# Patient Record
Sex: Female | Born: 1940
Health system: Southern US, Community
[De-identification: ages and names within clinical notes are randomized; demographics above are authoritative.]

## PROBLEM LIST (undated history)

## (undated) DIAGNOSIS — I5022 Chronic systolic (congestive) heart failure: Secondary | ICD-10-CM

## (undated) DIAGNOSIS — I4892 Unspecified atrial flutter: Secondary | ICD-10-CM

## (undated) DIAGNOSIS — I472 Ventricular tachycardia, unspecified: Secondary | ICD-10-CM

## (undated) DIAGNOSIS — I340 Nonrheumatic mitral (valve) insufficiency: Secondary | ICD-10-CM

## (undated) DIAGNOSIS — I10 Essential (primary) hypertension: Secondary | ICD-10-CM

## (undated) DIAGNOSIS — I779 Disorder of arteries and arterioles, unspecified: Secondary | ICD-10-CM

## (undated) DIAGNOSIS — M1990 Unspecified osteoarthritis, unspecified site: Secondary | ICD-10-CM

## (undated) DIAGNOSIS — I428 Other cardiomyopathies: Secondary | ICD-10-CM

## (undated) DIAGNOSIS — Z95810 Presence of automatic (implantable) cardiac defibrillator: Secondary | ICD-10-CM

## (undated) DIAGNOSIS — I639 Cerebral infarction, unspecified: Secondary | ICD-10-CM

## (undated) DIAGNOSIS — E785 Hyperlipidemia, unspecified: Secondary | ICD-10-CM

## (undated) DIAGNOSIS — E119 Type 2 diabetes mellitus without complications: Secondary | ICD-10-CM

## (undated) DIAGNOSIS — I313 Pericardial effusion (noninflammatory): Secondary | ICD-10-CM

## (undated) DIAGNOSIS — K219 Gastro-esophageal reflux disease without esophagitis: Secondary | ICD-10-CM

## (undated) DIAGNOSIS — I739 Peripheral vascular disease, unspecified: Secondary | ICD-10-CM

## (undated) DIAGNOSIS — I447 Left bundle-branch block, unspecified: Secondary | ICD-10-CM

## (undated) DIAGNOSIS — I3139 Other pericardial effusion (noninflammatory): Secondary | ICD-10-CM

## (undated) HISTORY — PX: HERNIA REPAIR: SHX51

## (undated) HISTORY — DX: Other pericardial effusion (noninflammatory): I31.39

## (undated) HISTORY — DX: Chronic systolic (congestive) heart failure: I50.22

## (undated) HISTORY — DX: Unspecified atrial flutter: I48.92

## (undated) HISTORY — DX: Type 2 diabetes mellitus without complications: E11.9

## (undated) HISTORY — PX: TONSILLECTOMY: SUR1361

## (undated) HISTORY — DX: Gastro-esophageal reflux disease without esophagitis: K21.9

## (undated) HISTORY — DX: Other cardiomyopathies: I42.8

## (undated) HISTORY — DX: Cerebral infarction, unspecified: I63.9

## (undated) HISTORY — DX: Peripheral vascular disease, unspecified: I73.9

## (undated) HISTORY — DX: Disorder of arteries and arterioles, unspecified: I77.9

## (undated) HISTORY — DX: Nonrheumatic mitral (valve) insufficiency: I34.0

## (undated) HISTORY — DX: Pericardial effusion (noninflammatory): I31.3

## (undated) HISTORY — DX: Left bundle-branch block, unspecified: I44.7

## (undated) HISTORY — DX: Hyperlipidemia, unspecified: E78.5

---

## 2000-09-29 ENCOUNTER — Encounter: Payer: Self-pay | Admitting: Family Medicine

## 2000-09-29 ENCOUNTER — Ambulatory Visit (HOSPITAL_COMMUNITY): Admission: RE | Admit: 2000-09-29 | Discharge: 2000-09-29 | Payer: Self-pay | Admitting: Family Medicine

## 2000-11-10 ENCOUNTER — Ambulatory Visit (HOSPITAL_COMMUNITY): Admission: RE | Admit: 2000-11-10 | Discharge: 2000-11-10 | Payer: Self-pay | Admitting: Cardiology

## 2001-02-13 ENCOUNTER — Encounter: Payer: Self-pay | Admitting: Cardiology

## 2001-02-13 ENCOUNTER — Ambulatory Visit (HOSPITAL_COMMUNITY): Admission: RE | Admit: 2001-02-13 | Discharge: 2001-02-13 | Payer: Self-pay | Admitting: Cardiology

## 2001-07-25 ENCOUNTER — Encounter: Payer: Self-pay | Admitting: Family Medicine

## 2001-07-25 ENCOUNTER — Ambulatory Visit (HOSPITAL_COMMUNITY): Admission: RE | Admit: 2001-07-25 | Discharge: 2001-07-25 | Payer: Self-pay | Admitting: Family Medicine

## 2001-10-15 ENCOUNTER — Ambulatory Visit (HOSPITAL_COMMUNITY): Admission: RE | Admit: 2001-10-15 | Discharge: 2001-10-15 | Payer: Self-pay | Admitting: Family Medicine

## 2001-10-15 ENCOUNTER — Encounter: Payer: Self-pay | Admitting: Family Medicine

## 2002-01-08 ENCOUNTER — Encounter: Admission: RE | Admit: 2002-01-08 | Discharge: 2002-03-19 | Payer: Self-pay | Admitting: Cardiology

## 2002-11-06 ENCOUNTER — Ambulatory Visit (HOSPITAL_COMMUNITY): Admission: RE | Admit: 2002-11-06 | Discharge: 2002-11-06 | Payer: Self-pay | Admitting: Family Medicine

## 2002-11-06 ENCOUNTER — Encounter: Payer: Self-pay | Admitting: Family Medicine

## 2003-03-05 ENCOUNTER — Ambulatory Visit (HOSPITAL_COMMUNITY): Admission: RE | Admit: 2003-03-05 | Discharge: 2003-03-05 | Payer: Self-pay | Admitting: Cardiology

## 2003-11-27 ENCOUNTER — Ambulatory Visit (HOSPITAL_COMMUNITY): Admission: RE | Admit: 2003-11-27 | Discharge: 2003-11-27 | Payer: Self-pay | Admitting: Pulmonary Disease

## 2004-04-01 ENCOUNTER — Ambulatory Visit (HOSPITAL_COMMUNITY): Admission: RE | Admit: 2004-04-01 | Discharge: 2004-04-01 | Payer: Self-pay | Admitting: Cardiology

## 2004-05-04 ENCOUNTER — Ambulatory Visit (HOSPITAL_COMMUNITY): Admission: RE | Admit: 2004-05-04 | Discharge: 2004-05-04 | Payer: Self-pay

## 2004-05-06 ENCOUNTER — Ambulatory Visit: Payer: Self-pay | Admitting: Internal Medicine

## 2004-06-06 HISTORY — PX: ABDOMINAL HYSTERECTOMY: SHX81

## 2004-10-12 ENCOUNTER — Ambulatory Visit: Payer: Self-pay | Admitting: Cardiology

## 2004-11-02 ENCOUNTER — Ambulatory Visit (HOSPITAL_COMMUNITY): Admission: RE | Admit: 2004-11-02 | Discharge: 2004-11-02 | Payer: Self-pay | Admitting: Pulmonary Disease

## 2004-11-05 ENCOUNTER — Ambulatory Visit (HOSPITAL_COMMUNITY): Admission: RE | Admit: 2004-11-05 | Discharge: 2004-11-05 | Payer: Self-pay | Admitting: Pulmonary Disease

## 2004-11-29 ENCOUNTER — Ambulatory Visit (HOSPITAL_COMMUNITY): Admission: RE | Admit: 2004-11-29 | Discharge: 2004-11-29 | Payer: Self-pay | Admitting: Pulmonary Disease

## 2005-02-25 ENCOUNTER — Ambulatory Visit: Payer: Self-pay | Admitting: Internal Medicine

## 2005-02-25 ENCOUNTER — Inpatient Hospital Stay (HOSPITAL_COMMUNITY): Admission: EM | Admit: 2005-02-25 | Discharge: 2005-02-26 | Payer: Self-pay | Admitting: Emergency Medicine

## 2005-05-12 ENCOUNTER — Ambulatory Visit: Payer: Self-pay | Admitting: Cardiology

## 2005-05-24 ENCOUNTER — Ambulatory Visit: Payer: Self-pay | Admitting: Internal Medicine

## 2005-05-26 ENCOUNTER — Ambulatory Visit: Payer: Self-pay | Admitting: Internal Medicine

## 2005-05-26 ENCOUNTER — Ambulatory Visit (HOSPITAL_COMMUNITY): Admission: RE | Admit: 2005-05-26 | Discharge: 2005-05-26 | Payer: Self-pay | Admitting: Internal Medicine

## 2005-05-26 ENCOUNTER — Encounter: Payer: Self-pay | Admitting: Internal Medicine

## 2005-05-27 ENCOUNTER — Ambulatory Visit (HOSPITAL_COMMUNITY): Admission: RE | Admit: 2005-05-27 | Discharge: 2005-05-27 | Payer: Self-pay | Admitting: Internal Medicine

## 2005-09-07 ENCOUNTER — Ambulatory Visit (HOSPITAL_COMMUNITY): Admission: RE | Admit: 2005-09-07 | Discharge: 2005-09-07 | Payer: Self-pay | Admitting: Pulmonary Disease

## 2005-10-25 ENCOUNTER — Ambulatory Visit: Payer: Self-pay | Admitting: Cardiology

## 2005-12-01 ENCOUNTER — Ambulatory Visit (HOSPITAL_COMMUNITY): Admission: RE | Admit: 2005-12-01 | Discharge: 2005-12-01 | Payer: Self-pay | Admitting: Pulmonary Disease

## 2006-01-15 ENCOUNTER — Emergency Department (HOSPITAL_COMMUNITY): Admission: EM | Admit: 2006-01-15 | Discharge: 2006-01-15 | Payer: Self-pay | Admitting: Emergency Medicine

## 2006-01-17 ENCOUNTER — Ambulatory Visit (HOSPITAL_COMMUNITY): Admission: RE | Admit: 2006-01-17 | Discharge: 2006-01-17 | Payer: Self-pay | Admitting: Pulmonary Disease

## 2006-01-25 ENCOUNTER — Ambulatory Visit (HOSPITAL_COMMUNITY): Admission: RE | Admit: 2006-01-25 | Discharge: 2006-01-25 | Payer: Self-pay | Admitting: Pulmonary Disease

## 2006-06-06 HISTORY — PX: COLONOSCOPY: SHX174

## 2006-06-19 ENCOUNTER — Ambulatory Visit (HOSPITAL_COMMUNITY): Admission: RE | Admit: 2006-06-19 | Discharge: 2006-06-19 | Payer: Self-pay | Admitting: Pulmonary Disease

## 2006-06-21 ENCOUNTER — Ambulatory Visit (HOSPITAL_COMMUNITY): Admission: RE | Admit: 2006-06-21 | Discharge: 2006-06-21 | Payer: Self-pay | Admitting: Pulmonary Disease

## 2006-07-11 ENCOUNTER — Ambulatory Visit: Payer: Self-pay | Admitting: Internal Medicine

## 2006-07-12 ENCOUNTER — Ambulatory Visit (HOSPITAL_COMMUNITY): Admission: RE | Admit: 2006-07-12 | Discharge: 2006-07-12 | Payer: Self-pay | Admitting: Internal Medicine

## 2006-07-19 ENCOUNTER — Encounter (HOSPITAL_COMMUNITY): Admission: RE | Admit: 2006-07-19 | Discharge: 2006-08-18 | Payer: Self-pay | Admitting: Internal Medicine

## 2006-08-22 ENCOUNTER — Ambulatory Visit: Payer: Self-pay | Admitting: Internal Medicine

## 2006-09-12 ENCOUNTER — Ambulatory Visit (HOSPITAL_COMMUNITY): Admission: RE | Admit: 2006-09-12 | Discharge: 2006-09-12 | Payer: Self-pay | Admitting: Internal Medicine

## 2006-09-12 ENCOUNTER — Ambulatory Visit: Payer: Self-pay | Admitting: Internal Medicine

## 2006-11-02 ENCOUNTER — Ambulatory Visit: Payer: Self-pay | Admitting: Cardiology

## 2006-11-02 ENCOUNTER — Ambulatory Visit (HOSPITAL_COMMUNITY): Admission: RE | Admit: 2006-11-02 | Discharge: 2006-11-02 | Payer: Self-pay | Admitting: Cardiology

## 2006-11-07 ENCOUNTER — Ambulatory Visit (HOSPITAL_COMMUNITY): Admission: RE | Admit: 2006-11-07 | Discharge: 2006-11-07 | Payer: Self-pay | Admitting: Cardiology

## 2006-11-07 ENCOUNTER — Ambulatory Visit: Payer: Self-pay | Admitting: Cardiology

## 2006-12-04 ENCOUNTER — Ambulatory Visit (HOSPITAL_COMMUNITY): Admission: RE | Admit: 2006-12-04 | Discharge: 2006-12-04 | Payer: Self-pay | Admitting: Pulmonary Disease

## 2006-12-21 ENCOUNTER — Ambulatory Visit (HOSPITAL_COMMUNITY): Admission: RE | Admit: 2006-12-21 | Discharge: 2006-12-21 | Payer: Self-pay | Admitting: Pulmonary Disease

## 2007-06-07 ENCOUNTER — Emergency Department (HOSPITAL_COMMUNITY): Admission: EM | Admit: 2007-06-07 | Discharge: 2007-06-08 | Payer: Self-pay | Admitting: Emergency Medicine

## 2007-06-08 ENCOUNTER — Emergency Department (HOSPITAL_COMMUNITY): Admission: EM | Admit: 2007-06-08 | Discharge: 2007-06-08 | Payer: Self-pay | Admitting: Emergency Medicine

## 2007-07-25 ENCOUNTER — Ambulatory Visit (HOSPITAL_COMMUNITY): Admission: RE | Admit: 2007-07-25 | Discharge: 2007-07-25 | Payer: Self-pay | Admitting: Cardiology

## 2007-07-25 ENCOUNTER — Ambulatory Visit: Payer: Self-pay | Admitting: Cardiology

## 2007-09-07 ENCOUNTER — Ambulatory Visit (HOSPITAL_COMMUNITY): Admission: RE | Admit: 2007-09-07 | Discharge: 2007-09-07 | Payer: Self-pay | Admitting: Pulmonary Disease

## 2007-12-05 ENCOUNTER — Ambulatory Visit (HOSPITAL_COMMUNITY): Admission: RE | Admit: 2007-12-05 | Discharge: 2007-12-05 | Payer: Self-pay

## 2008-05-08 ENCOUNTER — Inpatient Hospital Stay (HOSPITAL_COMMUNITY): Admission: EM | Admit: 2008-05-08 | Discharge: 2008-05-09 | Payer: Self-pay | Admitting: Emergency Medicine

## 2008-05-08 ENCOUNTER — Ambulatory Visit: Payer: Self-pay | Admitting: Cardiology

## 2008-05-14 ENCOUNTER — Ambulatory Visit: Payer: Self-pay | Admitting: Cardiology

## 2008-05-14 DIAGNOSIS — I679 Cerebrovascular disease, unspecified: Secondary | ICD-10-CM | POA: Insufficient documentation

## 2008-05-14 DIAGNOSIS — I472 Ventricular tachycardia, unspecified: Secondary | ICD-10-CM | POA: Insufficient documentation

## 2008-05-14 DIAGNOSIS — E785 Hyperlipidemia, unspecified: Secondary | ICD-10-CM | POA: Insufficient documentation

## 2008-09-24 ENCOUNTER — Encounter (INDEPENDENT_AMBULATORY_CARE_PROVIDER_SITE_OTHER): Payer: Self-pay | Admitting: *Deleted

## 2008-09-24 LAB — CONVERTED CEMR LAB
ALT: 19 units/L
BUN: 18 mg/dL
CO2: 24 meq/L
Calcium: 9.7 mg/dL
Chloride: 97 meq/L
Creatinine, Ser: 0.57 mg/dL
Hgb A1c MFr Bld: 7.1 %
LDL Cholesterol: 39 mg/dL
TSH: 1.775 microintl units/mL

## 2008-12-09 ENCOUNTER — Ambulatory Visit (HOSPITAL_COMMUNITY): Admission: RE | Admit: 2008-12-09 | Discharge: 2008-12-09 | Payer: Self-pay | Admitting: Pulmonary Disease

## 2008-12-19 ENCOUNTER — Encounter (INDEPENDENT_AMBULATORY_CARE_PROVIDER_SITE_OTHER): Payer: Self-pay | Admitting: *Deleted

## 2009-01-01 ENCOUNTER — Ambulatory Visit (HOSPITAL_COMMUNITY): Admission: RE | Admit: 2009-01-01 | Discharge: 2009-01-01 | Payer: Self-pay | Admitting: Pulmonary Disease

## 2009-04-02 ENCOUNTER — Encounter (INDEPENDENT_AMBULATORY_CARE_PROVIDER_SITE_OTHER): Payer: Self-pay | Admitting: *Deleted

## 2009-04-02 LAB — CONVERTED CEMR LAB
Albumin: 4.4 g/dL
Alkaline Phosphatase: 41 units/L
BUN: 14 mg/dL
Calcium: 9.3 mg/dL
Glucose, Bld: 146 mg/dL
Hgb A1c MFr Bld: 7 %
Potassium: 4.1 meq/L
Triglycerides: 172 mg/dL

## 2009-05-26 ENCOUNTER — Encounter (INDEPENDENT_AMBULATORY_CARE_PROVIDER_SITE_OTHER): Payer: Self-pay | Admitting: *Deleted

## 2009-05-28 ENCOUNTER — Ambulatory Visit: Payer: Self-pay | Admitting: Cardiology

## 2009-05-28 ENCOUNTER — Ambulatory Visit (HOSPITAL_COMMUNITY): Admission: RE | Admit: 2009-05-28 | Discharge: 2009-05-28 | Payer: Self-pay | Admitting: Cardiology

## 2009-05-28 DIAGNOSIS — E039 Hypothyroidism, unspecified: Secondary | ICD-10-CM | POA: Insufficient documentation

## 2009-05-28 DIAGNOSIS — K219 Gastro-esophageal reflux disease without esophagitis: Secondary | ICD-10-CM | POA: Insufficient documentation

## 2009-05-28 DIAGNOSIS — I447 Left bundle-branch block, unspecified: Secondary | ICD-10-CM | POA: Insufficient documentation

## 2009-06-01 ENCOUNTER — Encounter (INDEPENDENT_AMBULATORY_CARE_PROVIDER_SITE_OTHER): Payer: Self-pay | Admitting: *Deleted

## 2009-06-22 ENCOUNTER — Encounter (INDEPENDENT_AMBULATORY_CARE_PROVIDER_SITE_OTHER): Payer: Self-pay | Admitting: *Deleted

## 2009-12-14 ENCOUNTER — Ambulatory Visit (HOSPITAL_COMMUNITY): Admission: RE | Admit: 2009-12-14 | Discharge: 2009-12-14 | Payer: Self-pay | Admitting: Pulmonary Disease

## 2009-12-25 ENCOUNTER — Ambulatory Visit (HOSPITAL_COMMUNITY): Admission: RE | Admit: 2009-12-25 | Discharge: 2009-12-25 | Payer: Self-pay | Admitting: Pulmonary Disease

## 2010-05-14 ENCOUNTER — Encounter (INDEPENDENT_AMBULATORY_CARE_PROVIDER_SITE_OTHER): Payer: Self-pay | Admitting: *Deleted

## 2010-06-08 ENCOUNTER — Encounter (INDEPENDENT_AMBULATORY_CARE_PROVIDER_SITE_OTHER): Payer: Self-pay | Admitting: *Deleted

## 2010-06-09 ENCOUNTER — Encounter (INDEPENDENT_AMBULATORY_CARE_PROVIDER_SITE_OTHER): Payer: Self-pay | Admitting: *Deleted

## 2010-06-09 ENCOUNTER — Ambulatory Visit
Admission: RE | Admit: 2010-06-09 | Discharge: 2010-06-09 | Payer: Self-pay | Source: Home / Self Care | Attending: Cardiology | Admitting: Cardiology

## 2010-06-27 ENCOUNTER — Encounter: Payer: Self-pay | Admitting: Internal Medicine

## 2010-07-06 ENCOUNTER — Ambulatory Visit (HOSPITAL_COMMUNITY)
Admission: RE | Admit: 2010-07-06 | Discharge: 2010-07-06 | Payer: Self-pay | Source: Home / Self Care | Attending: Pulmonary Disease | Admitting: Pulmonary Disease

## 2010-07-06 NOTE — Miscellaneous (Signed)
Summary: amiodorone refill  Clinical Lists Changes  Medications: Rx of AMIODARONE HCL 200 MG TABS (AMIODARONE HCL) take 1/2 tab by mouth once daily;  #30 x 6;  Signed;  Entered by: Teressa Lower RN;  Authorized by: Kathlen Brunswick, MD, [facility];  Method used: Electronically to [facility]*, [address] [place], [place], [place]  [address], Ph: [phone] or [phone], Fax: [phone]    Prescriptions: AMIODARONE HCL 200 MG TABS (AMIODARONE HCL) take 1/2 tab by mouth once daily  #30 x 6   Entered by:   Teressa Lower RN   Authorized by:   Kathlen Brunswick, MD, [facility]   Signed by:   Teressa Lower RN on 06/22/2009   Method used:   Electronically to        [facility]* (retail)       [address]       [place], [place]  [address]       Ph: [phone] or [phone]       Fax: [phone]   RxID:   [id number]

## 2010-07-06 NOTE — Letter (Signed)
Summary: Appointment - Reminder 2  [facility]. [address]   [place], [place] [address]   Phone: [phone]  Fax: [phone]     May 14, 2010 MRN: [id number]   [facility] [address] [place], [place]  [address]   Dear Ms. Robbs,  Our records indicate that it is time to schedule a follow-up appointment.  Dr.  Dietrich Pates        recommended that you follow up with [place] in   12.2011         . It is very important that we reach you to schedule this appointment. We look forward to participating in your health care needs. Please contact us at the number listed above at your earliest convenience to schedule your appointment.  If you are unable to make an appointment at this time, give [place] a call so we can update our records.     Sincerely,   Glass blower/designer

## 2010-07-08 NOTE — Miscellaneous (Signed)
Summary: CHEST XRAY 05/28/2009  Clinical Lists Changes  Observations: Added new observation of CXR RESULTS:   Clinical Data: Dyspnea, history of CHF    CHEST - 2 VIEW    Comparison: 05/08/2008    Findings:   Upper normal heart size.   Normal mediastinal contours and pulmonary vascularity.   Atherosclerotic calcification at aortic arch.   No acute failure or consolidation.   Minimal rotation to left with question minimal biconvex   thoracolumbar scoliosis.   No pleural effusion or pneumothorax.    IMPRESSION:   No acute abnormalities.    Read By:  Lollie Marrow,  M.D.   Released By:  Lollie Marrow,  M.D.  Additional Information  HL7 RESULT STATUS : F  External image : [id number]  External IF Update Timestamp : 2009-05-28:13:40:31.000000  (05/28/2009 13:14)      CXR  Procedure date:  05/28/2009  Findings:        Clinical Data: Dyspnea, history of CHF    CHEST - 2 VIEW    Comparison: 05/08/2008    Findings:   Upper normal heart size.   Normal mediastinal contours and pulmonary vascularity.   Atherosclerotic calcification at aortic arch.   No acute failure or consolidation.   Minimal rotation to left with question minimal biconvex   thoracolumbar scoliosis.   No pleural effusion or pneumothorax.    IMPRESSION:   No acute abnormalities.    Read By:  Lollie Marrow,  M.D.   Released By:  Lollie Marrow,  M.D.  Additional Information  HL7 RESULT STATUS : F  External image : [id number]  External IF Update Timestamp : 2009-05-28:13:40:31.000000

## 2010-07-08 NOTE — Assessment & Plan Note (Signed)
Summary: f1y  Medications Added ASPIRIN 81 MG TBEC (ASPIRIN) Take one tablet by mouth daily      Allergies Added:   Visit Type:  Follow-up Primary Provider:  Dr. Kari Baars   History of Present Illness: Donna [name] returns to the office for continued followup of nonischemic cardiomyopathy.  She has continued to do superbly with no cardiopulmonary symptoms despite normal activity including performing all of her housework.  She has had no significant illnesses during the past 12 months, nor has she developed any new medical problems.  Unfortunately, her 43 year old son recently died suddenly, but she appears to be working through the grieving process.  Current Medications (verified): 1)  Digoxin 0.125 Mg Tabs (Digoxin) .... Take 1 Tablet Daily 2)  Furosemide 40 Mg Tabs (Furosemide) .... Take 1 Tablet By Mouth Two Times A Day 3)  Benicar 20 Mg Tabs (Olmesartan Medoxomil) .Marland Kitchen.. 1 Tablet By Mouth Once Daily 4)  Spironolactone 25 Mg Tabs (Spironolactone) .Marland Kitchen.. 1 Tab By Mouth Once Daily 5)  Amiodarone Hcl 200 Mg Tabs (Amiodarone Hcl) .... Take 1/2 Tab By Mouth Once Daily 6)  Simvastatin 40 Mg Tabs (Simvastatin) .Marland Kitchen.. 1 Tab By Mouth Once Daily 7)  Klor-Con 20 Meq Pack (Potassium Chloride) .... Take 1 Tab By Mouth Two Times A Day 8)  Levothyroxine Sodium 75 Mcg Tabs (Levothyroxine Sodium) .... Take 1 Tab By Mouth Once Daily 9)  Glipizide 5 Mg Tabs (Glipizide) .... Take 1 Tab By Mouth Two Times A Day 10)  Omeprazole 20 Mg Cpdr (Omeprazole) .... Take 1 Tab By Mouth Two Times A Day 11)  Fish Oil 1000 Mg Caps (Omega-3 Fatty Acids) .... Take 1 Tab By Mouth Three Times A Day 12)  Calcium 500 Mg Tabs (Calcium Carbonate) .... Take 1 Tab By Mouth Two Times A Day 13)  Aspirin 81 Mg Tbec (Aspirin) .... Take One Tablet By Mouth Daily  Allergies (verified): 1)  ! * Codine 2)  ! * Decongestant  Comments:  Nurse/Medical Assistant: patient brought meds reviewed also from previous ov the only  meds not brought was her calcium and fish oil  Past History:  PMH, FH, and Social History reviewed and updated.  Review of Systems  The patient denies hoarseness, chest pain, syncope, dyspnea on exertion, peripheral edema, prolonged cough, headaches, and abdominal pain.    Vital Signs:  Patient profile:   70 year old female Weight:      112 pounds BMI:     21.24 O2 Sat:      98 % on Room air Pulse rate:   65 / minute BP sitting:   143 / 76  (left arm)  Vitals Entered By: Dreama Saa, CNA (June 09, 2010 2:56 PM)  O2 Flow:  Room air  Physical Exam  General:  Trim and well-developed; no acute distress:   Neck-No JVD; no carotid bruits: Lungs-No tachypnea no rales; no rhonchi; no wheezes: Cardiovascular-normal PMI; normal S1 and prominently split S2; grade 2/6 holosystolic murmur at the apex; no third heart sound Abdomen-BS normal; soft and non-tender without masses or organomegaly:  Musculoskeletal-No deformities, no cyanosis or clubbing: Neurologic-Normal cranial nerves; symmetric strength and tone:  Skin-Warm, no significant lesions: Extremities-Nl distal pulses; no edema:     Impression & Recommendations:  Problem # 1:  CARDIOMYOPATHY, DILATED (ICD-425.4) Patient's performance status is excellent despite severely impaired left ventricular systolic function.  Current medical regime will be continued.  Problem # 2:  CEREBROVASCULAR DISEASE (ICD-437.9) Mild atherosclerosis noted on  a carotid ultrasound study of a few years ago.  Aspirin given to reduce the risk of a neurological event, but dose will be decreased to 81 mg q.d.  Problem # 3:  VENTRICULAR TACHYCARDIA (ICD-427.1) No arrhythmias identified within the past few years during treatment with low-dose amiodarone.  Recent monitoring studies were normal and will be repeated in 6 months.  I will reassess this nice woman in one year.  Other Orders: Future Orders: T-Comprehensive Metabolic Panel ([id number]) ...  12/08/2010 T-CBC w/Diff ([id number]) ... 12/08/2010 T-TSH [id number]) ... 12/08/2010 T-Chest x-ray, 2 views ([id number]) ... 12/08/2010  Patient Instructions: 1)  Your physician recommends that you schedule a follow-up appointment in: 1 YEAR 2)  Your physician recommends that you return for lab work in: 6 MONTHS 3)  Your physician has recommended you make the following change in your medication: DECREASE ASPIRIN TO 81MG  DAILY

## 2010-07-08 NOTE — Letter (Signed)
Summary: [facility], [facility] at [employer]  [address], [place] [address]   Phone: [phone]  Fax: [phone]     June 09, 2010 MRN: [id number]   [facility] [address] [place], [place]  [address]      YOUR LAB WORK IS DUE   December 08, 2010  Please go to [facility], located across the street from [facility] on the second floor.  Hours are Monday - Friday 7am until 7:30pm         Saturday 8am until 12noon    _X_  DO NOT EAT OR DRINK AFTER MIDNIGHT EVENING PRIOR TO LABWORK  __ YOUR LABWORK IS NOT FASTING --YOU MAY EAT PRIOR TO LABWORK   PLEASE GO TO [facility] AND HAVE CHEST XRAY DONE AT THIS TIME

## 2010-07-15 ENCOUNTER — Other Ambulatory Visit (HOSPITAL_COMMUNITY): Payer: Self-pay | Admitting: Pulmonary Disease

## 2010-07-19 ENCOUNTER — Ambulatory Visit (HOSPITAL_COMMUNITY)
Admission: RE | Admit: 2010-07-19 | Discharge: 2010-07-19 | Disposition: A | Discharge status: Home / Self Care | Payer: MEDICARE | Source: Ambulatory Visit | Attending: Pulmonary Disease | Admitting: Pulmonary Disease

## 2010-07-19 ENCOUNTER — Encounter (HOSPITAL_COMMUNITY): Payer: Self-pay

## 2010-07-19 DIAGNOSIS — R109 Unspecified abdominal pain: Secondary | ICD-10-CM | POA: Insufficient documentation

## 2010-07-19 HISTORY — DX: Essential (primary) hypertension: I10

## 2010-07-19 MED ORDER — TECHNETIUM TC 99M MEBROFENIN IV KIT
5.0000 | PACK | Freq: Once | INTRAVENOUS | Status: AC | PRN
  Administered 2010-07-19: 5.4 via INTRAVENOUS

## 2010-10-01 ENCOUNTER — Emergency Department (HOSPITAL_COMMUNITY): Payer: No Typology Code available for payment source

## 2010-10-01 ENCOUNTER — Emergency Department (HOSPITAL_COMMUNITY)
Admission: EM | Admit: 2010-10-01 | Discharge: 2010-10-01 | Disposition: A | Discharge status: Home / Self Care | Payer: No Typology Code available for payment source | Attending: Emergency Medicine | Admitting: Emergency Medicine

## 2010-10-01 DIAGNOSIS — S0003XA Contusion of scalp, initial encounter: Secondary | ICD-10-CM | POA: Insufficient documentation

## 2010-10-01 DIAGNOSIS — S60229A Contusion of unspecified hand, initial encounter: Secondary | ICD-10-CM | POA: Insufficient documentation

## 2010-10-01 DIAGNOSIS — S0990XA Unspecified injury of head, initial encounter: Secondary | ICD-10-CM | POA: Insufficient documentation

## 2010-10-01 DIAGNOSIS — Y9289 Other specified places as the place of occurrence of the external cause: Secondary | ICD-10-CM | POA: Insufficient documentation

## 2010-10-01 DIAGNOSIS — IMO0002 Reserved for concepts with insufficient information to code with codable children: Secondary | ICD-10-CM | POA: Insufficient documentation

## 2010-10-01 DIAGNOSIS — S1093XA Contusion of unspecified part of neck, initial encounter: Secondary | ICD-10-CM | POA: Insufficient documentation

## 2010-10-01 DIAGNOSIS — M1990 Unspecified osteoarthritis, unspecified site: Secondary | ICD-10-CM | POA: Insufficient documentation

## 2010-10-04 ENCOUNTER — Ambulatory Visit (HOSPITAL_COMMUNITY)
Admission: RE | Admit: 2010-10-04 | Discharge: 2010-10-04 | Disposition: A | Discharge status: Home / Self Care | Payer: No Typology Code available for payment source | Source: Ambulatory Visit | Attending: Pulmonary Disease | Admitting: Pulmonary Disease

## 2010-10-04 ENCOUNTER — Other Ambulatory Visit (HOSPITAL_COMMUNITY): Payer: Self-pay | Admitting: Pulmonary Disease

## 2010-10-04 DIAGNOSIS — R52 Pain, unspecified: Secondary | ICD-10-CM

## 2010-10-04 DIAGNOSIS — S79919A Unspecified injury of unspecified hip, initial encounter: Secondary | ICD-10-CM | POA: Insufficient documentation

## 2010-10-04 DIAGNOSIS — M25559 Pain in unspecified hip: Secondary | ICD-10-CM | POA: Insufficient documentation

## 2010-10-04 DIAGNOSIS — S79929A Unspecified injury of unspecified thigh, initial encounter: Secondary | ICD-10-CM | POA: Insufficient documentation

## 2010-10-19 NOTE — H&P (Signed)
Donna [name], Donna [name]              ACCOUNT NO.:  [id number]   MEDICAL RECORD NO.:  [id number]          PATIENT TYPE:  INP   LOCATION:  2037                         FACILITY:  [facility]   PHYSICIAN:  Marca Ancona, MD      DATE OF BIRTH:  1940-10-30   DATE OF ADMISSION:  05/08/2008  DATE OF DISCHARGE:                              HISTORY & PHYSICAL   PRIMARY CARDIOLOGIST:  Gerrit Friends. Dietrich Pates, MD, [facility]   PRIMARY CARE PHYSICIAN:  Dr. Juanetta Gosling.   HOSPITAL COURSE:  Palpitations/chest pain.   HISTORY OF PRESENT ILLNESS:  Three days ago, the patient reports feeling  sick, kind of tired, and just not right.  She felt strained sensation in  her lower left chest but not really painful.  Yesterday, she felt  totally fine.  Day before yesterday when she had her symptoms, the  patient went to see her primary care physician.  She had an EKG  performed, this was not concerning to her primary care doctor.  She was  scheduled to see her primary cardiologist as soon as it was possible,  which was in 1 week.  She felt fine yesterday.  Today May 08, 2008,  she woke up with palpitations.  She had mild nausea and a warm sensation  and the return of the chest sensation in her lower left chest.  These  symptoms lasted approximately 20 minutes.  She checked her blood  pressure which was 150/82.  Her blood pressure usually runs somewhere  between 112 and 118 over 60s.  She had no other associated symptoms this  morning, so no shortness of breath.  No vomiting.  No dizziness.  No  diaphoresis.  The patient was concerned so she called EMS and was  brought to the [facility] Emergency Department.   ALLERGIES:  The patient is allergic to CODEINE and decongestants.   MEDICATIONS:  1. The patient takes potassium supplement 20 mEq by mouth twice a day.  2. She takes Lasix 40 mg by mouth twice a day.  3. She takes levothyroxine 0.075 mg by mouth daily.  4. She takes Benicar 20 mg by mouth daily.  5.  She takes spironolactone 25 mg by mouth daily.  6. She takes amiodarone 100 mg by mouth daily.  7. She takes glipizide 5 mg by mouth twice a day.  8. She takes simvastatin 40 mg by mouth daily.  9. She takes 1 aspirin 325 mg by mouth daily.  10.She takes omeprazole 20 mg by mouth daily.  11.Digoxin 0.125 mg by mouth daily.  12.She also takes calcium 600 mg twice a day by mouth.  13.Fish oil 3 tablets by mouth daily.   PAST MEDICAL HISTORY:  The patient has a distant history of heart  catheterization, she has had 2.  I could not find any notes on this, 1  was approximately 13 years ago and 1 was a few years after that.  She  has a history of atypical chest pain.  She has a history of systolic  heart failure, currently class I.  She had an echo performed on  November 07, 2006, this showed mild LV dilatation with global hypokinesis.  Severely  impaired overall LV systolic function.  This echo showed no significant  changes from the last one which was performed in September 2004.  Her  ejection fraction in June 2008, was 25%.  She also has a history of  diabetes mellitus type 2 for which she takes oral medication.  She also  has hypothyroidism.  She takes oral medications for that as well.  She  has GERD.  The patient had an ultrasound of the carotids in 2008, this  showed no significant disease.   PAST SURGICAL HISTORY:  She has 2 hernial repairs.  She had had a  hysterectomy and an EGD in December 2006.   SOCIAL HISTORY:  She lives in [place] with her son and grandson.  She  is retired; however, she is very active.  She has no smoking history.  She does not drink any alcohol.  No illegal drugs.  Her diet; low-sugar,  low-sodium, and low-saturated fat diet.  She exercises every single day  and walks 2-3 miles a day.   FAMILY HISTORY:  Her mother is deceased at 27 from multiorgan failure.  She had diabetes and kidney disease.  Her father died at 4 years old  from an unknown cancer.   Siblings, she has 3 brothers that are deceased,  1 from an heart attack and 2 from cancer.  She has 1 sister who is  living who has diabetes mellitus.   REVIEW OF SYSTEMS:  She had currently other than her sensation in her  lower left chest described as a chief complaint and in the history of  present illness she has no other symptoms, so all systems reviewed were  negative.   PHYSICAL EXAMINATION:  VITAL SIGNS:  Her temperature was 98.4 degrees  Fahrenheit; her pulse was 106; however, when I examined her pulse was in  the 60s; her respiration rate was 14; her blood pressure 117/80; and her  O2 saturation 97% on room air.  GENERAL:  On exam, she was alert and oriented x3 in no apparent  distress.  HEAD:  Normocephalic and atraumatic.  EYES:  Pupils were equal, round, and reactive to light.  Extraocular  muscles were intact.  NECK:  Supple without lymphadenopathy.  She had no thyromegaly.  No  bruits.  No JVD.  HEART:  Regular rate and rhythm with S1 and S2.  She did have a  paradoxically split S2.  There was a 2/6 left sternal systolic murmur.  Her PMI was slightly displaced.  LUNGS:  Clear to auscultation bilaterally.  She had no rashes or lesions  or petechiae.  ABDOMEN:  Soft and nontender with normal abdominal bowel sounds.  No  rebound or guarding and no hepatosplenomegaly.  EXTREMITIES:  No edema.  No clubbing.  No cyanosis.  She had no joint  deformities, no effusions, no spinal deformities, and no CVA tenderness.  NEUROLOGIC:  She had cranial nerves II through XII grossly intact.  Her  strength was 5/5 in all extremities and axial group.  She had normal  sensation throughout and normal cerebellar function.   IMAGING:  She had a chest x-ray 1 view, this showed no active disease.   EKG, she had a sinus rhythm in a rate of 66, she had a left bundle  branch that is not new.  She had no Q waves and her PR was 188, QRS 175,  and QTC 485.  LABORATORY DATA:  White blood cell  count was 6.4, hemoglobin 13.6,  hematocrit 40.6, and platelet count was 241.  Sodium 135, potassium 4.3,  chloride 96, CO2 is 30, BUN 14, creatinine 0.7, and glucose was 181.  She had 1 set of negative cardiac enzymes.  Her CK-MB was 1.7, her  troponin I was less than 0.05.  She had a digoxin level that was 0.7.   ASSESSMENT AND PLAN:  This is a 70 year old female with a history of  nonischemic cardiomyopathy and that is well compensated and atypical  chest pain.  The patient will have her enzymes cycled.  She will be  admitted to tele and ruled out for cardiac chest pain.  Her lipids will  be checked in the morning.  She will have an echocardiogram performed  and she will follow up with [name] in an outpatient setting and we  will recommend an outpatient  Myoview stress test in the event that cardiac enzymes are negative.  If  they are positive, we will consider catheterization.  The patient will  also continue on her home medications, and we will add Coreg 3.125 mg by  mouth twice a day.  She will also have the BNP checked as well as her  thyroid.  We will be checking her TSH.      Jarrett Ables, [facility]      Marca Ancona, MD  Electronically Signed    MS/MEDQ  D:  05/08/2008  T:  05/09/2008  Job:  [id number]

## 2010-10-19 NOTE — Letter (Signed)
July 25, 2007    Edward L. Juanetta Gosling, M.D.  [address]  [place], [place] [address]   RE:  Donna [name], Donna [name]  MRN:  [id number]  /  DOB:  1940-07-01   Dear Donna [name],   Donna [name] returns to the office as scheduled for continued assessment  treatment of  nonischemic cardiomyopathy.  Since her last visit, she has  done fine.  She was in the emergency department only for a laceration to  her hand.  She has had some sinus problems that have been well managed  under your care.  She reports no cardiopulmonary symptoms.   Current medications include KCl 20 mEq b.i.d., furosemide 40 mg b.i.d.  levothyroxine 0.075 mg daily, Benicar 20 mg daily, spironolactone 25 mg  daily, amiodarone 100 mg daily, glipizide 5 mg b.i.d., simvastatin 40 mg  daily, aspirin 325 mg daily, omeprazole  20 mg daily, digoxin 0.05 mg  daily, fish oil 3 tablets daily, calcium 600 mg b.i.d..   On exam, slight pleasant woman in no acute distress.  The weight is 114,  stable.  Blood pressure 130/70, heart rate 66 and regular, respirations  18.  NECK:  No jugular venous distention; left carotid bruit versus  transmitted murmur.  LUNGS:  Clear.  CARDIAC:  Normal first and second heart sounds; grade 3/6 basilar  systolic ejection murmur.  ABDOMEN:  Soft and nontender; no masses; no organomegaly.  EXTREMITIES:  No edema; distal pulses intact.   IMPRESSION:  Donna [name] is doing generally well.  She had a carotid  ultrasound study last year that showed no significant obstructive  disease.  She did have some atherosclerosis for which daily aspirin is  appropriate.  Otherwise, her medications appear optimal.  Laboratory was  obtained in October and was fine.  We will check an EKG and a chest x-  ray.  If results are good, I will plan to see this nice woman again in 1  year.  Vaccinations are up-to-date.    Sincerely,      Gerrit Friends. Dietrich Pates, MD, [facility]  Electronically Signed    RMR/MedQ  DD: 07/25/2007  DT: 07/25/2007   Job #: [id number]

## 2010-10-19 NOTE — Letter (Signed)
Nov 02, 2006    Edward L. Juanetta Gosling, M.D.  [address]  [place], [place] [address]   RE:  Donna [name], Donna [name]  MRN:  [id number]  /  DOB:  [birth year]   Dear Renae Fickle:   It was my pleasure to reevaluate [name] at your request.  Unfortunately, my office staff failed to recall her when she was due a  few months ago. She has done well from a cardiac standpoint with no  dyspnea, no orthopnea and no pedal edema. She continues to take  amiodarone, now 9 years into her course, without adverse effects. She  really has done astoundingly well considering that she has had severe LV  dysfunction for at least 10 years.   Donna [name] has had an acute illness for the past four days. She notes  headache and fullness over the forehead. She has had some nasal  congestion, but not much in the way of discharge. She has not noted  subjective fever nor chills. She has no cough nor sputum production. She  has not noted pharyngitis.   CURRENT MEDICATIONS:  1. KCl 20 mEq b.i.d.  2. Furosemide 40 mg b.i.d.  3. Levothyroxine 0.075 mg daily.  4. Benicar 20 mg daily.  5. Spironolactone 25 mg daily.  6. Amiodarone 100 mg daily.  7. Digoxin 0.125 mg daily.  8. Glipizide 5 mg b.i.d.  9. Simvastatin 40 mg daily.  10.Aspirin 325 mg daily.  11.Omeprazole 20 mg daily.   On examination, trim, pleasant woman with a slightly hoarse voice in no  acute distress. Temperature is 97.8, weight is 114, two pounds less than  last year. Blood pressure is 125/60, heart rate 60 and regular,  respirations 16.  NECK: No jugular venous distention; normal carotid upstrokes without  bruits.  LUNGS:  Clear.  CARDIAC: Normal 1states that, increased intensity of the 2nd heart  sounds; grade 2/6 systolic decrescendo murmur at the left sternal  border.  ABDOMEN: Soft and nontender; no organomegaly.  EXTREMITIES: Trace edema on the left.   EKG: Sinus rhythm with PVCs; left bundle branch block. Comparison with  prior study of May  2007, reveals no significant change except that PVCs  are now present.   IMPRESSION:  Donna [name] appears to be doing generally well from a  cardiac standpoint. Her current symptoms are compatible with a viral  upper respiratory infection or perhaps a sinusitis. She indicates that  you will be calling her back to recommend appropriate therapy. I will  obtain her monitoring laboratories for amiodarone which include a chest  x-ray and TSH level. The hepatic profile, chemistry profile and lipid  profile that you obtained yesterday are acceptable. I will plan to see  this nice woman again in nine months.    Sincerely,      Gerrit Friends. Dietrich Pates, MD, [facility]  Electronically Signed    RMR/MedQ  DD: 11/02/2006  DT: 11/02/2006  Job #: [id number]

## 2010-10-19 NOTE — Letter (Signed)
May 14, 2008    [name], MD  [address]  [place], [place] [address]   RE:  Donna [name], Donna [name]  MRN:  [id number]  /  DOB:  1940-12-09   Dear Renae Fickle,   Ms. Muckey returns to the office after a recent brief admission to  [facility] for fairly vague symptoms.  She describes a malaise  in her left chest.  She initially indicated that there was some  tachycardia or palpitations, but there also appeared to be some  discomfort and perhaps some pressure.  In any case, the various  physicians who saw her during that visit had different concepts as to  what the problem was.  Her cardiac markers were negative.  Her EKG is  nondiagnostic due to the presence of long-standing left bundle-branch  block.  Her symptoms resolved over a few hours leading to discharge  without any extensive workup.   Since I last saw her 10 months ago, she has continued to do beautifully.  She really has no symptoms of congestive heart failure.  Despite a long-  standing ejection fraction of 25%, she has remained stable for years.   All of her routine testing was done in hospital.  Chest x-ray, thyroid  function studies, hepatic profile, electrolytes, renal function, and CBC  were all normal.  No arrhythmias were documented on telemetry.   Current medications are as listed in her recent discharge summary.   PHYSICAL EXAMINATION:  GENERAL:  A pleasant well-appearing woman in no  acute distress.  VITAL SIGNS:  The weight is 114 pounds, stable.  Blood pressure 125/70,  heart rate 60 and regular, respirations 14.  NECK:  No jugular venous distention; no carotid bruits.  LUNGS:  Clear.  CARDIAC:  Normal first and second heart sounds; grade 2/6 holosystolic  murmur at the lower left sternal border; left ventricular lift;  dyskinetic and laterally displaced apex.  ABDOMEN:  Soft and nontender; no organomegaly.  EXTREMITIES:  No edema; normal distal pulses.   EKG:  Normal sinus rhythm; left atrial  abnormality; left bundle-branch  block.  No change when compared to previous a tracing of July 25, 2007.   IMPRESSION:  Ms. Colquhoun had a symptomatic spell, whose etiology is  uncertain.  She did have a lot of associated abdominal pain while in  hospital.  This all may have been related to her gastroesophageal reflux  disease as you have already told her.  She is having no further symptoms  at the present time.  No further evaluation is required.  The likelihood  of her having developed significant coronary disease since normal  coronary angiography 10 years ago is small.  We will consider this at  her annual visit, and I will plan to see this nice woman again in 12  months.  Please call me before then should any new cardiac issues  developed.    Sincerely,      Gerrit Friends. Dietrich Pates, MD, [facility]  Electronically Signed    RMR/MedQ  DD: 05/14/2008  DT: 05/15/2008  Job #: [id number]

## 2010-10-19 NOTE — Discharge Summary (Signed)
NAMEGLENDORA, Donna [name]              ACCOUNT NO.:  [id number]   MEDICAL RECORD NO.:  [id number]          PATIENT TYPE:  INP   LOCATION:  2037                         FACILITY:  [facility]   PHYSICIAN:  Marca Ancona, MD      DATE OF BIRTH:  [birth year]   DATE OF ADMISSION:  05/08/2008  DATE OF DISCHARGE:  05/09/2008                               DISCHARGE SUMMARY   ADDENDUM   Addendum to read, Coreg is dictated as a discharge med in error.  Coreg  was not prescribed on day of discharge, as this was discontinued on day  of discharge.  Please make that correction.      [name], NP      Marca Ancona, MD  Electronically Signed    KML/MEDQ  D:  05/09/2008  T:  05/10/2008  Job:  [id number]

## 2010-10-19 NOTE — Discharge Summary (Signed)
[name], [name]              ACCOUNT NO.:  [id number]   MEDICAL RECORD NO.:  [id number]          PATIENT TYPE:  INP   LOCATION:  2037                         FACILITY:  [facility]   PHYSICIAN:  Marca Ancona, MD      DATE OF BIRTH:  01-30-1941   DATE OF ADMISSION:  05/08/2008  DATE OF DISCHARGE:  05/09/2008                               DISCHARGE SUMMARY   PRIMARY CARDIOLOGIST:  Gerrit Friends. Dietrich Pates, MD, [name]   PRIMARY CARE PHYSICIAN:  [name], MD   PROCEDURE PERFORMED DURING HOSPITALIZATION:  None.   FINAL DISCHARGE DIAGNOSES:  1. Atypical chest pain.  2. Nonobstructive cardiomyopathy, left ventricular ejection fraction      25% per echocardiogram, June 2008.  3. History of systolic heart failure.  4. Diabetes mellitus type 2.  5. Hypothyroidism.  6. Gastroesophageal reflux disease.   HOSPITAL COURSE:  This is a 70 year old female patient who was admitted  on May 08, 2008 secondary to strange sensation in her lower left  chest.  The patient experiences while she was eating.  It was localized,  came on at rest, and became stronger throughout the day.  The patient  saw primary care physician that day.  EKG was completed and she was  scheduled to see primary cardiologist 1 week following.  The following  day, the patient began to have palpitations, nausea, warm sensation and  return of chest discomfort with no other associated symptoms lasting  approximately 20 minutes.  Secondary to these symptoms, the patient  called EMS and was brought to [facility] Emergency Room for further  evaluation.  The patient was seen and examined in [facility] Emergency  Room by [name] and [name], physician assistant.   The patient was admitted to rule out myocardial infarction.  The chest  pain found to be atypical.  Cardiac enzymes were found to be negative x3  without any evidence of EKG changes suggestive of ischemia.  The patient  also had lipids and LFTs drawn  which revealed elevated triglycerides to  210.  The patient's telemetry revealed normal sinus rhythm without any  arrhythmias.  There were no further complaints of chest pain.  During  hospitalization, the patient's Protonix was increased to 40 mg daily.  Her amiodarone was decreased to 100 mg daily.  Her dig level was found  to be 0.7 on admission.  Initiation of Coreg 3.125 mg twice a day, it  was started along with increase in her Lasix from 40 mg daily to 40 mg  twice a day.  The patient had no further complaints of chest pain and  was seen and examined by [name] on day of discharge and found  to be stable.  It has been recommended by [name] that the patient  followup with the GI specialist which could recommended through Dr.  Dietrich Pates through her primary care physician at his discretion.  The  patient had no evidence of GI bleeding throughout hospitalization and  hemoglobin and hematocrit remained stable.   LABS ON DISCHARGE:  LDL 69, HDL 27, triglycerides 210, BNP  91.  Cardiac  enzymes negative x3.  Hemoglobin 13.6, hematocrit 40.6, white blood  cells 6.4, platelets 241.  Sodium 135, potassium 4.3, chloride 96, CO2  30, BUN 14, creatinine 0.7, glucose 181, dig level 0.7.   VITAL SIGNS:  Blood pressure 127/69, pulse 60, respirations 15, O2 sat  96% on room air, and temperature 97.4.   DISCHARGE MEDICATIONS:  1. Benicar 20 mg daily.  2. Simvastatin 40 mg daily.  3. Amiodarone 100 mg daily (decreased from 200 mg daily).  4. Klor-Con 20 mEq twice a day.  5. Digoxin 0.125 mg daily.  6. Omeprazole 40 mg daily (increased dose from 20 mg daily).  7. Glipizide ER 5 mg daily.  8. Furosemide 40 mg twice a day (increased from once daily).  9. Spirolactone 25 mg daily.  10.Levothyroxine 75 mcg daily.  11.Coreg 3.125 mg twice a day (new prescription provided).  12.Aspirin 325 mg daily.   ALLERGIES:  CODEINE.   FOLLOWUP PLANS AND APPOINTMENT:  1. The patient will follow  with [name] on June 11, 2007      at 2:30 p.m.  2. It had advised that after Dr. Marvel Plan assessment that the      patient followup and have an outpatient stress Myoview.  3. The patient will follow with Dr. Juanetta Gosling.  She is to call to make      that appointment on her own accord.  4. It is suggested by [name] that the patient follow with      the GI specialist for further evaluation of epigastric pain.  This      can be arranged through Dr. Marvel Plan or through Dr. Juanetta Gosling'      office at their discretion.  Time spent with the patient to include      physician time 35 minutes.      [name], NP      Marca Ancona, MD  Electronically Signed    KML/MEDQ  D:  05/09/2008  T:  05/09/2008  Job:  [id number]   cc:   [name], M.D.

## 2010-10-19 NOTE — Procedures (Signed)
[name], [name]              ACCOUNT NO.:  [id number]   MEDICAL RECORD NO.:  [id number]          PATIENT TYPE:  OUT   LOCATION:  RAD                           FACILITY:  [facility]   PHYSICIAN:  Gerrit Friends. Dietrich Pates, MD, FACCDATE OF BIRTH:  07-02-1940   DATE OF PROCEDURE:  11/07/2006  DATE OF DISCHARGE:                                ECHOCARDIOGRAM   REFERRING:  [name], M.D., and Gerrit Friends. Dietrich Pates, MD   CLINICAL DATA:  A 70 year old woman with longstanding cardiomyopathy.   Aorta 3.3, left atrium 3.4, septum 1.2, posterior wall 1.2, LV diastole  6.0, LV systole 5.3.   1. Technically adequate echocardiographic study.  2. Mild left atrial enlargement; normal right atrium and right      ventricle.  3. Normal aortic valve; normal proximal ascending aorta.  4. Normal mitral valve; trivial regurgitation.  5. Normal tricuspid valve; physiologic regurgitation.  6. Normal pulmonic valve and proximal pulmonary artery.  7. Mild left ventricular dilatation with global hypokinesis.  Severely      impaired overall LV systolic function; estimated ejection fraction      is 0.25.  8. Normal IVC.  9. Comparison with prior study of March 05, 2003:  No significant      interval change.      Gerrit Friends. Dietrich Pates, MD, [facility]  Electronically Signed     RMR/MEDQ  D:  11/08/2006  T:  11/08/2006  Job:  [id number]

## 2010-10-22 NOTE — H&P (Signed)
[name], [name]              ACCOUNT NO.:  [id number]   MEDICAL RECORD NO.:  [id number]           PATIENT TYPE:   LOCATION:  RAD                           FACILITY:  [facility]   PHYSICIAN:  R. Roetta Sessions, M.D. DATE OF BIRTH:  1941-05-19   DATE OF ADMISSION:  DATE OF DISCHARGE:  LH                              HISTORY & PHYSICAL   CHIEF COMPLAINT:  Right-sided abdominal pain, need for colorectal cancer  screen.   Donna [name] is a 70 year old lady with now a 6-week history of  intermittent postprandial abdominal pain.  It was originally generalized  but now she tells me that pain starts in the right upper quadrant and  migrates down to the right lower quadrant, has not had any associated  bowel symptoms.  No melena or rectal bleeding, constipation, diarrhea.  No other upper GI tract symptoms such as nausea, vomiting, odynophagia  or dysphagia.  Takes omeprazole 20 grams orally daily.  Her CAT scan  demonstrated quite a bit of __________ arterial plaquing.  A CT  angiogram demonstrated no significant mesenteric stenosis; however, she  does appear to have relatively high grade renal artery stenosis possibly  hemodynamically significant based on the July 12, 2006 study.  Gallbladder also recently demonstrated no abnormalities.  HIDA scan  demonstrated gallbladder EF 64% with no real reproduction of symptoms  with fatty meal.  She has never had her lower GI tract evaluated.  Prior  EGD demonstrated some nonspecific gastritis and biopsies demonstrated  mild chronic gastropathy.  There is no family history of GI neoplasia   PAST MEDICAL HISTORY:  1. Type 2 diabetes mellitus.  2. Hypercholesterolemia.  3. Congestive heart failure with a LV EF 25% followed by Dr. Dietrich [name].  4. History of hypothyroidism.   PAST SURGICAL HISTORY:  1. Bilateral inguinal herniorrhaphy.  2. Hysterectomy.  3. Tonsillectomy.  4. EGD.   MEDICATIONS:  1. Lasix 40 mg 2 tablets daily.  2. Amiodarone  100 mg 4 tablets daily.  3. Spironolactone 25 mg daily.  4. Synthroid 0.75 mg daily.  5. Potassium 20 mEq 2 tablets daily.  6. Glipizide 5 mg daily.  7. Simvastatin daily 40 mg.  8. Lanoxin 0.05 daily.  9. Benicar 40 grams daily.  10.Omeprazole 20 grams daily.   ALLERGIES:  CODEINE, DECONGESTANT MEDICATIONS.   FAMILY HISTORY:  Positive heart disease , although, Father may have  succumbed some type of stomach cancer.   SOCIAL HISTORY:  Patient with is widowed.  She is disabled.  No tobacco,  alcohol, illicit drugs.   REVIEW OF SYSTEMS:  No recent chest pain, dyspnea exertion.  Weight is  stable at 116 pounds.   PHYSICAL EXAMINATION:  GENERAL:  A pleasant 65-year lady resting  comfortably.  VITAL SIGNS:  Weight 116.5, height 5 feet, 1 inch, temperature 98, BP  108/60, pulse 72.  SKIN:  Warm and dry.  There is no jaundice.  HEENT:  No scleral icterus.  NECK:  JVD not prominent.  CHEST:  Lungs are clear to auscultation.  CARDIOVASCULAR:  Regular rate and rhythm without murmur, gallop or rub  ABDOMEN:  Nondistended.  Positive bowel sounds, soft and no bruits.  She  has some right upper and right lower quadrant tenderness to deep  palpation.  No appreciable mass, rebound tenderness, organomegaly.  EXTREMITIES:  No edema.  RECTAL:  Deferred to time of colonoscopy.   IMPRESSION:  Donna [name] is a pleasant 70 year old lady with  postprandial right-sided abdominal plain . We did have concerns about  mesenteric ischemia, although she does have multi-vessel plaquing and  nothing felt to be significant on CT angiogram aside from bilateral  renal artery stenosis which will need further evaluation elsewhere.  Gallbladder checks out okay with ultrasound and HIDA.  Etiology for  right-sided symptoms not entirely clear at this time.  It does stick out  that she has never had her lower GI tract evaluated.  To this end, she  will go ahead and colonoscopy done before making further   recommendations.  I have reviewed the approach of a colonoscopy with [name], potential risks, benefits, alternatives have been discussed.  Questions were answered.  She is agreeable.  Will make further  recommendations once colonoscopy has been performed.   As a separate issue, I will have her return to see Dr. Juanetta [name] to see if  he feels further evaluation of the renal artery stenosis seen on CT  angiogram is in order.  Further recommendations to follow.Donna [name], M.D.  Electronically Signed     RMR/MEDQ  D:  08/22/2006  T:  08/22/2006  Job:  [id number]   cc:   Donna [name]. Donna Pates, MD, [facility]  [address]  [place], [place] [address]   Donna [name]. Donna [name], M.D.  Fax: [phone]

## 2010-10-22 NOTE — H&P (Signed)
[name], Donna [name]              ACCOUNT NO.:  [id number]   MEDICAL RECORD NO.:  [id number]          PATIENT TYPE:  INP   LOCATION:  A218                          FACILITY:  [facility]   PHYSICIAN:  Edward L. Juanetta Gosling, M.D.DATE OF BIRTH:  09-07-1940   DATE OF ADMISSION:  02/25/2005  DATE OF DISCHARGE:  LH                                HISTORY & PHYSICAL   REASON FOR ADMISSION:  Chest pain.   HISTORY OF PRESENT ILLNESS:  Donna [name] is a 70 year old with a history of  congestive heart failure which apparently has been nonischemic.  I do not  have all the information about that presently, but she says she has had  cardiac catheterization twice and both of those were negative for any  blockages.  The last one was about 5 years ago.  In the last 48 hours, she  has had episodes of chest discomfort that are substernal pressure-like that  moved to her left arm.  She has had some nausea associated with it and no  shortness of breath.  She says last night her blood pressure and pulse went  up.  She became alarmed, called EMS and was taken to the emergency room.  In  the emergency room, she was treated with aspirin, nitroglycerin and oxygen  and improved.  She said that she also had some nausea with this.  She had  negative point of care enzymes and has been brought into the hospital on  nitroglycerin and Lovenox for further care.   PAST MEDICAL HISTORY:  1.  Congestive heart failure.  2.  Hypertension.  3.  Non-insulin dependent diabetes mellitus.  4.  Hysterectomy.   MEDICATIONS:  1.  Lasix 40 mg b.i.d.  2.  Amiodarone 100 mg daily.  3.  Spironolactone 25 mg daily.  4.  Synthroid 75 mcg daily.  5.  Lanoxin 0.5 mg daily.  6.  Potassium chloride 20 mEq b.i.d.  7.  Benicar 20 mg daily.  8.  Glipizide 5 mg daily.  9.  Valium 5 mg q.i.d. p.r.n.  10. She had Zoloft with her, but that is actually her grandson's medication.   SOCIAL HISTORY:  Her husband died earlier this year of lung  cancer.  She  does not smoke and she does not drink any alcohol.   FAMILY HISTORY:  Positive for cardiac disease.  It is not totally clear the  extent of that.   ALLERGIES:  CODEINE and DECONGESTANTS.   PHYSICAL EXAMINATION:  GENERAL:  Well-developed, well-nourished female who  does not appear to be in acute distress now.  She says her pain is better  and she rates it at about a 1.  VITAL SIGNS:  Blood pressure 140/78, pulse 90, respirations 18.  HEENT:  Pupils equal round and reactive to light and accommodation.  Mucous  membranes are dry.  NECK:  Supple without masses.  CHEST:  Clear without wheezes, rales or rhonchi.  HEART:  Regular without murmurs, rubs or gallops.  ABDOMEN:  Soft without masses.  EXTREMITIES:  No edema.  Her pulses in her feet are normal.  NEUROLOGIC:  Grossly intact.   LABORATORY DATA AND X-RAY FINDINGS:  EKG shows left bundle branch block.   ASSESSMENT:  Chest pain in a patient with congestive heart failure.   PLAN:  Go ahead and cycle cardiac enzymes.  Cycle EKGs.  Have [facility] consultation and then decide what the best course of action is  from there.      Edward L. Juanetta Gosling, M.D.  Electronically Signed     ELH/MEDQ  D:  02/25/2005  T:  02/25/2005  Job:  [id number]

## 2010-10-22 NOTE — Procedures (Signed)
   Donna [name], Donna [name]                        ACCOUNT NO.:  [id number]   MEDICAL RECORD NO.:  [id number]                   PATIENT TYPE:  OUT   LOCATION:  RAD                                  FACILITY:  [facility]   PHYSICIAN:  [place] Bing, M.D.               DATE OF BIRTH:  [birth year]   DATE OF PROCEDURE:  03/05/2003  DATE OF DISCHARGE:                                  ECHOCARDIOGRAM   REFERRING PHYSICIANS:  1. Patrica Duel, M.D.  2. [place] Bing, M.D.   CLINICAL DATA:  A 70 year old woman with a nonischemic cardiomyopathy and  mitral regurgitation.   M-MODE:  Aorta 3.6.  Left atrium 3.1.  Septum 1.3.  Posterior wall 1.0.  LV  diastole 5.8.  LV systole 5.1.   1. Technically adequate echocardiographic study.  2. Mild left atrial enlargement; normal right atrial size; normal right     ventricular size and function; borderline right ventricular hypertrophy.  3. Normal aortic valve; mild annular calcification.  4. Normal mitral valve; mild annular calcification; very mild regurgitation.  5. Normal tricuspid and pulmonic valves.  6. Mild left ventricular dilatation; borderline left ventricular     hypertrophy.  Global hypokinesis; virtual akinesis of the anterior wall.     Overall left ventricular systolic function is severely depressed.     Abnormal septal motion consistent with left bundle branch block.     Estimated ejection fraction of 0.20-0.25.  Normal IVC.   Comparison with prior study of March 03, 1997:  No significant interval  change.      ___________________________________________                                            [place] Bing, M.D.   RR/MEDQ  D:  03/05/2003  T:  03/05/2003  Job:  [id number]

## 2010-10-22 NOTE — Op Note (Signed)
Donna [name], Donna [name]              ACCOUNT NO.:  [id number]   MEDICAL RECORD NO.:  [id number]          PATIENT TYPE:  AMB   LOCATION:  DAY                           FACILITY:  [facility]   PHYSICIAN:  R. Roetta Sessions, M.D. DATE OF BIRTH:  August 22, 1940   DATE OF PROCEDURE:  09/12/2006  DATE OF DISCHARGE:                               OPERATIVE REPORT   PROCEDURE:  Diagnostic colonoscopy.   INDICATIONS FOR PROCEDURE:  70 year old lady with vague intermittent  postprandial right-sided abdominal pain.  She has atherosclerotic  vascular disease.  CT angiogram failed to demonstrate any critical  stenosis.  Recent gallbladder ultrasound and HIDA failed to demonstrate  any gallbladder abnormalities. She has never had her lower GI tract  imaged.  There is no family history of colorectal neoplasia.  Colonoscopy is now being done.  This approach has discussed with the  patient at length.  Potential risks, benefits and alternatives have been  reviewed, questions answered.  She is agreeable.  Please see  documentation in the medical record.   PROCEDURE NOTE:  O2 saturation, blood pressure, pulse and respirations  were monitored throughout the entire procedure.   CONSCIOUS SEDATION:  Versed 3 mg IV, Demerol 50 mg IV in divided doses.   INSTRUMENT:  Pentax video chip system.   FINDINGS:  Digital rectal exam revealed no abnormalities.   ENDOSCOPIC FINDINGS:  Pep was adequate.  Colon:  Colonic mucosa was surveyed from the rectosigmoid junction  through the left, transverse, right colon to area of appendiceal  orifice, ileocecal valve and cecum.  These structures well seen and  photographed for the record.  From this level scope was slowly  withdrawn.  All previously mentioned mucosal surfaces were again seen.  The patient had left-sided diverticula.  Remainder of colon mucosa  appeared normal.  The scope was pulled down into the rectum where  thorough examination of the rectal mucosa including  retroflex view of  anal verge revealed only internal hemorrhoids.   The patient tolerated procedure well, was reacted in endoscopy.   IMPRESSION:  Internal hemorrhoids, otherwise normal rectum, extensive  left-sided diverticula.  Remainder of colonic mucosa appeared normal.   RECOMMENDATIONS:  Daily Benefiber fiber supplement 1 tablespoon daily.  Will go ahead and check some baseline labs if none have been done  recently, including CBC, LFTs amylase, lipase.  She is to continue  omeprazole 20 mg orally daily and add Benefiber as described above.  Plan to see this nice lady back in one month and see how she is doing.      Donna [name], M.D.  Electronically Signed     RMR/MEDQ  D:  09/12/2006  T:  09/12/2006  Job:  [id number]   cc:   [name], M.D.  Fax: [phone]   Gerrit Friends. Dietrich Pates, MD, [facility]  [address]  [place], [place] [address]

## 2010-10-22 NOTE — Discharge Summary (Signed)
[name], Donna [name]              ACCOUNT NO.:  [id number]   MEDICAL RECORD NO.:  [id number]          PATIENT TYPE:  INP   LOCATION:  A218                          FACILITY:  [facility]   PHYSICIAN:  Edward L. Juanetta Gosling, M.D.DATE OF BIRTH:  [birth year]   DATE OF ADMISSION:  02/25/2005  DATE OF DISCHARGE:  09/23/2006LH                                 DISCHARGE SUMMARY   FINAL DISCHARGE DIAGNOSES:  1.  Chest discomfort, myocardial infarction ruled out.  2.  Congestive heart failure, nonischemic.  3.  Hypertension.  4.  Hypothyroidism.  5.  Diabetes.  6.  History of cardiac arrhythmias.  7.  Left bundle branch block.   HISTORY OF PRESENT ILLNESS:  Donna [name] is a 70 year old with known  congestive heart failure which is nonischemic.  She has apparently had  cardiac catheterizations done.  The most recent about five years ago.  I do  not have the results of those cardiac catheterizations at this point.  She  presented to the emergency room after having a 48 hour history of episodes  of chest discomfort that are substernal pressure-like that had moved into  her left arm.  She has had nausea associated with it.  No shortness of  breath.  She felt like her blood pressure and pulse were up.  She checked it  at home.  Because her blood pressure and pulse were up, she called EMS and  was taken to the emergency room for evaluation.  In the emergency room, she  was treated with aspirin, nitroglycerin and oxygen and improved.   PHYSICAL EXAMINATION:  GENERAL APPEARANCE:  Well-developed, well-nourished  female who did not appear to be in acute distress.  She said her pain was  better and she rated it about a 1.  VITAL SIGNS:  Blood pressure 140/78, pulse 90, respirations 18.  CHEST:  Clear without wheezes, rales or rhonchi.  HEART:  Regular without murmurs, gallops, rubs.  ABDOMEN:  Soft without masses.  EXTREMITIES:  No edema.  NEUROLOGICAL:  Normal.   STUDIES:  Left bundle branch block.   CONSULTATIONS:  [facility], Dr. Lewayne Bunting, and he had  discussed with her the possibility of having an ICD device implanted because  of her congestive heart failure.  She has had cycled cardiac enzymes  Showed  no evidence of myocardial infarction.   DISPOSITION:  To home.   DISCHARGE MEDICATIONS:  1.  Lasix 40 mg b.i.d.  2.  Amiodarone 100 mg daily.  3.  Spironolactone 25 mg daily.  4.  Synthroid 75 mcg daily.  5.  Lanoxin 0.5 daily.  6.  Potassium chloride 20 mEq b.i.d.  7.  Benicar 20 mg daily.  8.  Glipizide 5 mg daily.  9.  Valium 5 mg q.i.d. p.r.n.   FOLLOWUP:  She is to follow up in my office and in the cardiology office.      Edward L. Juanetta Gosling, M.D.  Electronically Signed     ELH/MEDQ  D:  02/28/2005  T:  02/28/2005  Job:  [id number]

## 2010-10-22 NOTE — Group Therapy Note (Signed)
NAMETENITA, CUE              ACCOUNT NO.:  [id number]   MEDICAL RECORD NO.:  [id number]          PATIENT TYPE:  INP   LOCATION:  A218                          FACILITY:  [facility]   PHYSICIAN:  Angus G. Renard Matter, MD   DATE OF BIRTH:  May 14, 1941   DATE OF PROCEDURE:  02/26/2005  DATE OF DISCHARGE:  02/26/2005                                   PROGRESS NOTE   SUBJECTIVE:  The patient was admitted with chest pain.  She does have  history of congestive heart failure.  This was felt to be nonischemic.  She  had been seen by Cardiology.   OBJECTIVE:  The patient's cardiac enzymes remained normal.  CPK MB 1.3,  troponin less than 0.05, myoglobin 31.1.  HEART:  Regular rate and rhythm.  LUNGS:  Clear to P&A.  ABDOMEN:  Negative.   ASSESSMENT:  The patient was admitted with chest pain, felt to be  nonischemic.   PLAN:  Continue current regimen.      Angus G. Renard Matter, MD  Electronically Signed     AGM/MEDQ  D:  02/26/2005  T:  02/28/2005  Job:  [id number]

## 2010-10-22 NOTE — Consult Note (Signed)
Donna [name], PACZKOWSKI              ACCOUNT NO.:  [id number]   MEDICAL RECORD NO.:  [id number]          PATIENT TYPE:  INP   LOCATION:  A218                          FACILITY:  [facility]   PHYSICIAN:  Doylene Canning. Ladona Ridgel, M.D.  DATE OF BIRTH:  1940-12-25   DATE OF CONSULTATION:  02/25/2005  DATE OF DISCHARGE:                                   CONSULTATION   REFERRING PHYSICIAN:  [name], M.D.   REASON FOR CONSULTATION:  Evaluation of chest pain.   HISTORY OF PRESENT ILLNESS:  The patient is a very pleasant, 70 year old  woman with a history of nonischemic cardiomyopathy, left bundle branch block  and congestive heart failure.  She also has insulin-dependent diabetes.  The  patient has undergone catheterization in the past most recently in the past  several years ago.  She has long-standing congestive heart failure which has  varied between Class I and Class III in the past, presently Class II.  I  initially saw her back in December 2005, for consideration of prophylactic  ICD implantation as well as possible enrollment in the CRT study which  compares single chamber versus biventricular ICD implantation in a patient  with Class II heart failure and left bundle branch block.  At that time, the  patient had a very ill husband who has subsequently died.  She was in her  usual state of health until yesterday when she developed substernal chest  pain which was not associated with exertion, not associated with shortness  of breath, but was associated with nausea.  She came to the emergency room.  Her baseline EKG shows continued left bundle branch block with sinus rhythm  and she was admitted for evaluation.  Initial cardiac enzymes were  unremarkable.  The patient was treated with aspirin, nitroglycerin and  oxygen.  Her symptoms improved.  She was admitted for evaluation.  So far,  serial cardiac enzymes have been negative.  The patient denies syncope and  has minimal palpitations.   MEDICATIONS:  Lasix, amiodarone, Aldactone, Synthroid, Lanoxin, potassium,  Benicar, Glipizide, p.r.n. Valium.   SOCIAL HISTORY:  The patient is widowed.  She denies alcohol or ethanol use.   FAMILY HISTORY:  Positive for heart problems which are unspecified.   ALLERGIES:  CODEINE and DECONGESTANTS.   REVIEW OF SYSTEMS:  As noted in the HPI, otherwise all systems reviewed and  found to be normal.  The patient states that she can walk without  difficulty.   IMPRESSION:  1.  Atypical chest pain.  2.  Nonischemic cardiomyopathy with congestive heart failure, presently      Class II, previously Class III.  3.  Diabetes.  4.  Hypertension.   DISCUSSION:  I have discussed the treatment options with the patient.  I  recommended that she undergo serial cardiac enzymes and if these are  negative, discharge home with early followup.  With regard to her  nonischemic cardiomyopathy and congestive heart failure with left bundle  branch block, she is a good candidate for enrollment in the CRT study which  randomizes patient's with a single chamber defibrillator  versus  biventricular device.  She is considering her options and will call us if  she would like to proceed with ICD implantation and enrollment in the study.  I will plan to see the patient back in the office with [name] after  discharge.           ______________________________  Doylene Canning. Ladona Ridgel, M.D.     GWT/MEDQ  D:  02/25/2005  T:  02/25/2005  Job:  [id number]   cc:   Patrica Duel, M.D.  [address]  [place]  [place] [address]  Fax: [phone]

## 2010-10-22 NOTE — Consult Note (Signed)
Donna [name]              ACCOUNT NO.:  [id number]   MEDICAL RECORD NO.:  [id number]          PATIENT TYPE:  AMB   LOCATION:  DAY                           FACILITY:  [facility]   PHYSICIAN:  R. Roetta Sessions, M.D. DATE OF BIRTH:  Dec 01, 1940   DATE OF CONSULTATION:  05/24/2005  DATE OF DISCHARGE:                                   CONSULTATION   REASON FOR CONSULTATION:  Chest pain.   HISTORY OF PRESENT ILLNESS:  [name] is a pleasant 70 year old  Caucasian female with a history of cardiomyopathy, recently evaluated for  atypical chest pain by [name] and associates.  She has an LVEF in the  25% range.  She was seen recently for a four to six-week history of  intermittent chest pain but not felt to be cardiac in etiology.  She  describes retrosternal burning which may come on and last for as long as a  week at a time, unrelenting, and then it completely resolves.  She may have  it a day or two at a time and it slowly waxes and wanes, not associated with  eating, pills, or fasting.  No melena, no rectal bleeding, on odynophagia or  dysphagia.  She has been on Protonix now for the better part of a month plus  has taken a number of antacids over the counter remedies without any effect  on her symptoms.  Her gallbladder remains in situ.  She denies any history  of peptic ulcer disease or any reflux symptoms in the past.  She has never  had her lower GI tract evaluated.  There is no history of tobacco or  alcohol.  Her husband passed away in 2004-08-30.  She has not had her upper  GI tract imaged.  She has not had an ultrasound, CT, etcetera.   PAST MEDICAL HISTORY:  1.  Type 2 diabetes mellitus.  2.  Hypercholesterolemia.  3.  Congestive heart failure.  4.  Hypothyroidism.   PAST SURGERIES:  1.  Bilateral inguinal herniorrhaphies.  2.  Hysterectomy.  3.  Tonsillectomy.   CURRENT MEDICATIONS:  1.  Lasix 40 mg two tablets daily.  2.  Amiodarone 100 mg daily.  3.  Spironolactone 25 mg daily.  4.  Synthroid 0.75 mg daily.  5.  Potassium 40 mEq daily.  6.  Glipizide ER 5 mg daily.  7.  Simvastatin once daily.  8.  Protonix 40 mg orally daily.   ALLERGIES:  1.  CODEINE.  2.  DECONGESTANTS.   FAMILY HISTORY:  Mother died at age 31 with heart disease.  Father died age  26 cause unknown.  No family history for chronic GI or liver illness.   SOCIAL HISTORY:  The patient is widowed.  She is retired.  No tobacco.  No  alcohol.   REVIEW OF SYSTEMS:  As in history of present illness.  She has not lost any  weight recently.  She denies any abdominal pain.  No constipation, diarrhea,  melena, or rectal bleeding.   PHYSICAL EXAMINATION:  GENERAL:  Reveals a pleasant 70 year old lady resting  comfortably.  VITAL SIGNS:  Weight 117, height 5 foot 1 inch, temp 97.9, BP 126/70, pulse  64.  SKIN:  Warm and dry.  There is no jaundice, no __________  stigmata of  chronic liver disease.  HEENT:  No scleral icterus.  Conjunctivae are pink.  Oral cavity:  No  lesions.  JVD is not prominent.  CHEST:  Lungs are clear to auscultation.  CARDIAC:  A 2 to 3 over 6 ejection murmur, may be diastolic left sternal  border.  BREAST:  Deferred.  ABDOMEN:  Nondistended.  Positive bowel sounds.  Soft.  Entirely nontender  to palpation.  No appreciable mass or organomegaly.   IMPRESSION:  [name] is a 70 year old lady with a very atypical  chest pain, not felt to be cardiac in etiology, over the past several weeks.  I am somewhat surprised she has not had any benefit from acid suppression or  antacid therapy.   I suppose we could be dealing with a musculoskeletal/chest wall pain.  Pill-  induced esophageal injury (would have to be recurrent) is in the  differential although she really does not give a history of any dysphagia  whatsoever.  I suppose gallbladder disease could also produce symptoms,  although this would be an atypical presentation.   She  has never had a screening colonoscopy.   RECOMMENDATIONS:  We will go ahead and offer Donna [name] an EGD to directly  visualize her upper GI tract to see if we can find out what is going on.  Would consider a gallbladder ultrasound if EGD is unrevealing.  As a  separate issue, at some point Donna [name] ought to consider having a  screening colonoscopy but we will address this with her once again a little  later in time.   ADDENDUM:  Labs from February 25, 2005, H&H 13.3 and 37.6.  LFTs completely  normal.   Further recommendations to follow.      Jonathon Bellows, M.D.  Electronically Signed     RMR/MEDQ  D:  05/24/2005  T:  05/24/2005  Job:  [id number]   cc:   [place] Bing, M.D. [facility]  [address]  [address]  [place]  [place] [address]   Donna [name], Dr.

## 2010-10-22 NOTE — Op Note (Signed)
[name], [name]              ACCOUNT NO.:  [id number]   MEDICAL RECORD NO.:  [id number]          PATIENT TYPE:  AMB   LOCATION:  DAY                           FACILITY:  [facility]   PHYSICIAN:  R. Roetta Sessions, M.D. DATE OF BIRTH:  Mar 04, 1941   DATE OF PROCEDURE:  05/26/2005  DATE OF DISCHARGE:                                 OPERATIVE REPORT   PROCEDURE:  Esophagogastroduodenoscopy with esophageal and gastric biopsy.   INDICATIONS FOR PROCEDURE:  The patient is a 70 year old lady with atypical  chest pain not felt to be cardiac in origin. She has had no significant  improvement on a course of Protonix 40 mg orally daily. EGD is now being  done. This approach has been discussed with the patient at length. Potential  risks, benefits, and alternatives have been reviewed and questions answered.  She is agreeable. Please see documentation in the medical record.   PROCEDURE NOTE:  O2 saturation, blood pressure, pulse, and respirations were  monitored throughout the entire procedure. Conscious sedation with Versed 3  mg IV and Demerol 75 mg IV in divided doses.   INSTRUMENT:  Olympus video chip system.   FINDINGS:  Examination of the tubular esophagus revealed multiple 1 to 2 mm  pale raised nodules in proximal esophagus consistent with squamous  papillomas. Esophageal mucosa otherwise appeared entirely normal. EG  junction easily traversed.   Stomach:  Gastric cavity was empty and insufflated well with air. Thorough  examination of gastric mucosa including retroflexed view of the proximal  stomach and esophagogastric junction demonstrated granular appearing gastric  mucosa. No infiltrating process. No frank erosion or ulceration. Pylorus  patent and easily traversed. Examination of bulb and second portion revealed  no abnormalities.   THERAPEUTIC/DIAGNOSTIC MANEUVERS:  Biopsies of the antrum and body were  taken for histologic study. One of the nodules in the esophagus was also  biopsied. The patient tolerated the procedure well and was reactive to  endoscopy.   IMPRESSION:  1.  Multiple small raised pale nodules of the overlying esophageal mucosa      consistent with squamous papillomas, benign lesion, biopsied. Otherwise      normal esophagus.  2.  Granular appearance of the gastric mucosa of uncertain clinical      significance. Otherwise gastric mucosa appeared okay. Biopsies taken.      Patent pylorus. Normal D1 and D2.   RECOMMENDATIONS:  Stop Protonix. Begin Aciphex 20 mg orally twice daily  before breakfast and supper for the next month. Will plan to see this nice  lady back in one month to see how she is doing. We will go ahead and proceed  with a gallbladder ultrasound in the very near future. Further  recommendations to follow.      Jonathon Bellows, M.D.  Electronically Signed     RMR/MEDQ  D:  05/26/2005  T:  05/27/2005  Job:  [id number]   cc:   [name], M.D.  Fax: [phone]

## 2010-11-24 ENCOUNTER — Other Ambulatory Visit (HOSPITAL_COMMUNITY): Payer: Self-pay | Admitting: Pulmonary Disease

## 2010-11-24 DIAGNOSIS — Z139 Encounter for screening, unspecified: Secondary | ICD-10-CM

## 2010-11-29 ENCOUNTER — Other Ambulatory Visit (HOSPITAL_COMMUNITY): Payer: Self-pay | Admitting: Pulmonary Disease

## 2010-11-29 ENCOUNTER — Ambulatory Visit (HOSPITAL_COMMUNITY)
Admission: RE | Admit: 2010-11-29 | Discharge: 2010-11-29 | Disposition: A | Discharge status: Home / Self Care | Payer: Medicare Other | Source: Ambulatory Visit | Attending: Pulmonary Disease | Admitting: Pulmonary Disease

## 2010-11-29 DIAGNOSIS — R52 Pain, unspecified: Secondary | ICD-10-CM

## 2010-11-29 DIAGNOSIS — M79609 Pain in unspecified limb: Secondary | ICD-10-CM | POA: Insufficient documentation

## 2010-11-29 DIAGNOSIS — M25539 Pain in unspecified wrist: Secondary | ICD-10-CM | POA: Insufficient documentation

## 2010-11-30 ENCOUNTER — Other Ambulatory Visit (HOSPITAL_COMMUNITY): Payer: Self-pay | Admitting: Pulmonary Disease

## 2010-11-30 DIAGNOSIS — I739 Peripheral vascular disease, unspecified: Secondary | ICD-10-CM

## 2010-12-03 ENCOUNTER — Ambulatory Visit (HOSPITAL_COMMUNITY)
Admission: RE | Admit: 2010-12-03 | Discharge: 2010-12-03 | Disposition: A | Discharge status: Home / Self Care | Payer: Medicare Other | Source: Ambulatory Visit | Attending: Pulmonary Disease | Admitting: Pulmonary Disease

## 2010-12-03 DIAGNOSIS — E119 Type 2 diabetes mellitus without complications: Secondary | ICD-10-CM | POA: Insufficient documentation

## 2010-12-03 DIAGNOSIS — I739 Peripheral vascular disease, unspecified: Secondary | ICD-10-CM

## 2010-12-03 DIAGNOSIS — M79609 Pain in unspecified limb: Secondary | ICD-10-CM | POA: Insufficient documentation

## 2010-12-03 DIAGNOSIS — I10 Essential (primary) hypertension: Secondary | ICD-10-CM | POA: Insufficient documentation

## 2010-12-06 ENCOUNTER — Other Ambulatory Visit: Payer: Self-pay | Admitting: Cardiology

## 2010-12-06 LAB — COMPREHENSIVE METABOLIC PANEL
AST: 19 U/L (ref 0–37)
Albumin: 4.8 g/dL (ref 3.5–5.2)
Alkaline Phosphatase: 51 U/L (ref 39–117)
Potassium: 4.2 mEq/L (ref 3.5–5.3)
Sodium: 133 mEq/L — ABNORMAL LOW (ref 135–145)
Total Protein: 7.1 g/dL (ref 6.0–8.3)

## 2010-12-06 LAB — CBC WITH DIFFERENTIAL/PLATELET
Basophils Absolute: 0 10*3/uL (ref 0.0–0.1)
Basophils Relative: 1 % (ref 0–1)
MCHC: 33.6 g/dL (ref 30.0–36.0)
Neutro Abs: 3.5 10*3/uL (ref 1.7–7.7)
Neutrophils Relative %: 64 % (ref 43–77)
RDW: 13.2 % (ref 11.5–15.5)

## 2010-12-14 ENCOUNTER — Ambulatory Visit (HOSPITAL_COMMUNITY)
Admission: RE | Admit: 2010-12-14 | Discharge: 2010-12-14 | Disposition: A | Discharge status: Home / Self Care | Payer: Medicare Other | Source: Ambulatory Visit | Attending: Cardiology | Admitting: Cardiology

## 2010-12-14 ENCOUNTER — Other Ambulatory Visit: Payer: Self-pay | Admitting: Cardiology

## 2010-12-14 DIAGNOSIS — E119 Type 2 diabetes mellitus without complications: Secondary | ICD-10-CM | POA: Insufficient documentation

## 2010-12-14 DIAGNOSIS — R0602 Shortness of breath: Secondary | ICD-10-CM | POA: Insufficient documentation

## 2010-12-14 DIAGNOSIS — I10 Essential (primary) hypertension: Secondary | ICD-10-CM | POA: Insufficient documentation

## 2010-12-16 ENCOUNTER — Ambulatory Visit (HOSPITAL_COMMUNITY)
Admission: RE | Admit: 2010-12-16 | Discharge: 2010-12-16 | Disposition: A | Discharge status: Home / Self Care | Payer: Medicare Other | Source: Ambulatory Visit | Attending: Pulmonary Disease | Admitting: Pulmonary Disease

## 2010-12-16 DIAGNOSIS — Z1231 Encounter for screening mammogram for malignant neoplasm of breast: Secondary | ICD-10-CM | POA: Insufficient documentation

## 2010-12-16 DIAGNOSIS — Z139 Encounter for screening, unspecified: Secondary | ICD-10-CM

## 2011-03-11 LAB — GLUCOSE, CAPILLARY: Glucose-Capillary: 129 mg/dL — ABNORMAL HIGH (ref 70–99)

## 2011-03-11 LAB — COMPREHENSIVE METABOLIC PANEL
AST: 23 U/L (ref 0–37)
CO2: 25 mEq/L (ref 19–32)
Calcium: 9 mg/dL (ref 8.4–10.5)
Creatinine, Ser: 0.46 mg/dL (ref 0.4–1.2)
GFR calc Af Amer: >60 mL/min (ref >60)
GFR calc non Af Amer: >60 mL/min (ref >60)
Glucose, Bld: 147 mg/dL — ABNORMAL HIGH (ref 70–99)
Sodium: 137 mEq/L (ref 135–145)
Total Protein: 6.4 g/dL (ref 6.0–8.3)

## 2011-03-11 LAB — POCT I-STAT, CHEM 8
BUN: 14 mg/dL (ref 6–23)
Calcium, Ion: 1.16 mmol/L (ref 1.12–1.32)
Chloride: 96 mEq/L (ref 96–112)
Creatinine, Ser: 0.7 mg/dL (ref 0.4–1.2)
TCO2: 30 mmol/L (ref 0–100)

## 2011-03-11 LAB — CBC
HCT: 38.7 % (ref 36.0–46.0)
Hemoglobin: 13.2 g/dL (ref 12.0–15.0)
MCHC: 33.6 g/dL (ref 30.0–36.0)
MCV: 98 fL (ref 78.0–100.0)
Platelets: 241 10*3/uL (ref 150–400)
RBC: 3.96 MIL/uL (ref 3.87–5.11)
RBC: 4.14 MIL/uL (ref 3.87–5.11)
WBC: 6.8 10*3/uL (ref 4.0–10.5)

## 2011-03-11 LAB — CK TOTAL AND CKMB (NOT AT ARMC)
Relative Index: INVALID (ref 0.0–2.5)
Total CK: 69 U/L (ref 7–177)

## 2011-03-11 LAB — TSH: TSH: 1.228 u[IU]/mL (ref 0.350–4.500)

## 2011-03-11 LAB — DIFFERENTIAL
Eosinophils Absolute: 0 10*3/uL (ref 0.0–0.7)
Eosinophils Relative: 1 % (ref 0–5)
Lymphocytes Relative: 21 % (ref 12–46)
Lymphs Abs: 1.3 10*3/uL (ref 0.7–4.0)
Monocytes Absolute: 0.3 10*3/uL (ref 0.1–1.0)
Monocytes Relative: 5 % (ref 3–12)

## 2011-03-11 LAB — TROPONIN I: Troponin I: 0.05 ng/mL (ref 0.00–0.06)

## 2011-03-11 LAB — POCT CARDIAC MARKERS: Troponin i, poc: <0.05 ng/mL (ref 0.00–0.09)

## 2011-03-11 LAB — DIGOXIN LEVEL: Digoxin Level: 0.7 ng/mL — ABNORMAL LOW (ref 0.8–2.0)

## 2011-03-11 LAB — LIPID PANEL: VLDL: 42 mg/dL — ABNORMAL HIGH (ref 0–40)

## 2011-03-11 LAB — CARDIAC PANEL(CRET KIN+CKTOT+MB+TROPI)
CK, MB: 2 ng/mL (ref 0.3–4.0)
Relative Index: INVALID (ref 0.0–2.5)
Total CK: 67 U/L (ref 7–177)

## 2011-03-11 LAB — PROTIME-INR: Prothrombin Time: 13.3 seconds (ref 11.6–15.2)

## 2011-03-11 LAB — APTT: aPTT: 27 seconds (ref 24–37)

## 2011-03-14 ENCOUNTER — Encounter: Payer: Self-pay | Admitting: Cardiology

## 2011-06-09 ENCOUNTER — Other Ambulatory Visit: Payer: Self-pay | Admitting: Cardiology

## 2011-06-15 ENCOUNTER — Encounter: Payer: Self-pay | Admitting: Cardiology

## 2011-06-15 ENCOUNTER — Ambulatory Visit (INDEPENDENT_AMBULATORY_CARE_PROVIDER_SITE_OTHER): Payer: No Typology Code available for payment source | Admitting: Cardiology

## 2011-06-15 DIAGNOSIS — K219 Gastro-esophageal reflux disease without esophagitis: Secondary | ICD-10-CM

## 2011-06-15 DIAGNOSIS — E039 Hypothyroidism, unspecified: Secondary | ICD-10-CM

## 2011-06-15 DIAGNOSIS — I428 Other cardiomyopathies: Secondary | ICD-10-CM

## 2011-06-15 DIAGNOSIS — I10 Essential (primary) hypertension: Secondary | ICD-10-CM | POA: Insufficient documentation

## 2011-06-15 DIAGNOSIS — E785 Hyperlipidemia, unspecified: Secondary | ICD-10-CM

## 2011-06-15 DIAGNOSIS — I679 Cerebrovascular disease, unspecified: Secondary | ICD-10-CM

## 2011-06-15 DIAGNOSIS — E119 Type 2 diabetes mellitus without complications: Secondary | ICD-10-CM | POA: Insufficient documentation

## 2011-06-15 DIAGNOSIS — I447 Left bundle-branch block, unspecified: Secondary | ICD-10-CM

## 2011-06-15 DIAGNOSIS — I472 Ventricular tachycardia: Secondary | ICD-10-CM

## 2011-06-15 MED ORDER — FUROSEMIDE 40 MG PO TABS: ORAL_TABLET | ORAL | Status: DC

## 2011-06-15 NOTE — Assessment & Plan Note (Addendum)
Patient's symptomatic status continues to be excellent.  Left ventricular systolic function has not been assessed for the past 5 years.  Repeat echocardiography will be obtained.  Systolic murmur is fairly impressive and almost certainly represents mitral or tricuspid regurgitation.  This issue will be addressed by echocardiography as well.

## 2011-06-15 NOTE — Patient Instructions (Signed)
Your physician recommends that you schedule a follow-up appointment in: 12 months  Your physician has requested that you have an echocardiogram. Echocardiography is a painless test that uses sound waves to create images of your heart. It provides your doctor with information about the size and shape of your heart and how well your heart's chambers and valves are working. This procedure takes approximately one hour. There are no restrictions for this procedure.  Your physician has recommended you make the following change in your medication:  Take 60 mg (1 1/2 tablets) daily and call if you gain > 4 pounds

## 2011-06-15 NOTE — Assessment & Plan Note (Signed)
Adequate control of hyperlipidemia with current medication.

## 2011-06-15 NOTE — Assessment & Plan Note (Addendum)
Blood pressure control has been a byproduct of treatment of cardiomyopathy and continues to be adequate.

## 2011-06-15 NOTE — Assessment & Plan Note (Signed)
Asymptomatic carotid bruit with atherosclerosis but no focal stenosis on ultrasound study in 12/2006 No bruit heard in 2013-does not appear to be an active problem.   

## 2011-06-15 NOTE — Progress Notes (Signed)
Patient ID: Donna [name], female   DOB: [birth year], 71 y.o.   MRN: [id number] HPI: Scheduled return visit for this for remarkable woman who remains asymptomatic despite a 20 year history of severe cardiomyopathy.  Since her last visit, she has continued to do extremely well.  She walks up to 2 miles daily without difficulty.  She has not been hospitalized or required urgent medical care nor has she developed any new significant medical problems.  She was recently treated with antibiotics for upper respiratory infection, which has improved.  She receives influenza vaccine annually.  She recently underwent resection of skin lesions from her left face and left forearm with apparently benign pathology results.  Prior to Admission medications   Medication Sig Start Date End Date Taking? Authorizing Provider  amiodarone (PACERONE) 200 MG tablet Take 100 mg by mouth daily.     Yes Historical Provider, MD  aspirin 81 MG tablet Take 81 mg by mouth daily.     Yes Historical Provider, MD  calcium gluconate 500 MG tablet Take 500 mg by mouth 2 (two) times daily.     Yes Historical Provider, MD  digoxin (LANOXIN) 0.125 MG tablet TAKE ONE (1) TABLET EACH DAY 06/09/11  Yes Gerrit Friends. Bryar Dahms, MD  fish oil-omega-3 fatty acids 1000 MG capsule Take 1 capsule by mouth 3 (three) times daily.     Yes Historical Provider, MD  glipiZIDE (GLUCOTROL) 5 MG tablet Take 5 mg by mouth 2 (two) times daily.     Yes Historical Provider, MD  levothyroxine (SYNTHROID, LEVOTHROID) 75 MCG tablet Take 75 mcg by mouth daily.     Yes Historical Provider, MD  losartan-hydrochlorothiazide (HYZAAR) 100-25 MG per tablet Take 1 tablet by mouth daily.   Yes Historical Provider, MD  omeprazole (PRILOSEC) 20 MG capsule Take 20 mg by mouth 2 (two) times daily.     Yes Historical Provider, MD  potassium chloride (KLOR-CON) 20 MEQ packet Take 20 mEq by mouth 2 (two) times daily.     Yes Historical Provider, MD  simvastatin (ZOCOR) 40 MG tablet Take 40  mg by mouth at bedtime.     Yes Historical Provider, MD  spironolactone (ALDACTONE) 25 MG tablet Take 25 mg by mouth daily.     Yes Historical Provider, MD  furosemide (LASIX) 40 MG tablet 60 mg (1 1/2 tablet daily) 06/15/11   Gerrit Friends. Dietrich Pates, MD    Allergies  Allergen Reactions  . Codeine     REACTION: rapid HB      Past medical history, social history, and family history reviewed and updated.  ROS: Denies orthopnea, PND, pedal edema, palpitations, lightheadedness or syncope.  PHYSICAL EXAM: BP 135/72  Pulse 64  Ht 5\' 1"  (1.549 m)  Wt 49.079 kg (108 lb 3.2 oz)  BMI 20.44 kg/m2  General-Well developed; no acute distress; bandage over the left paranasal region and left wrist Body habitus-slight Neck-No JVD; no carotid bruits Lungs-clear lung fields; resonant to percussion Cardiovascular-normal PMI; normal S1 and S2; grade 3/6 low pitched holosystolic murmur at the left sternal border and apex Abdomen-normal bowel sounds; soft and non-tender without masses or organomegaly Musculoskeletal-No deformities, no cyanosis or clubbing Neurologic-Normal cranial nerves; symmetric strength and tone Skin-Warm, no significant lesions Extremities-distal pulses intact; no edema  ASSESSMENT AND PLAN:  [place] Bing, MD 06/15/2011 9:49 AM

## 2011-06-15 NOTE — Assessment & Plan Note (Addendum)
Euthyroid clinically and chemically as assessed by a normal TSH 7 months ago, on 0.075 mg of levothyroxine per day

## 2011-06-15 NOTE — Assessment & Plan Note (Signed)
No arrhythmia documented for years with amiodarone therapy.  Corrected thyroid dysfunction and normal LFTs by recent monitoring laboratory studies.  Unremarkable chest x-ray one year ago.

## 2011-06-22 ENCOUNTER — Ambulatory Visit (HOSPITAL_COMMUNITY)
Admission: RE | Admit: 2011-06-22 | Discharge: 2011-06-22 | Disposition: A | Discharge status: Home / Self Care | Payer: Medicare Other | Source: Ambulatory Visit | Attending: Cardiology | Admitting: Cardiology

## 2011-06-22 DIAGNOSIS — I428 Other cardiomyopathies: Secondary | ICD-10-CM | POA: Insufficient documentation

## 2011-06-22 DIAGNOSIS — I059 Rheumatic mitral valve disease, unspecified: Secondary | ICD-10-CM

## 2011-06-22 DIAGNOSIS — I10 Essential (primary) hypertension: Secondary | ICD-10-CM | POA: Insufficient documentation

## 2011-06-22 DIAGNOSIS — E785 Hyperlipidemia, unspecified: Secondary | ICD-10-CM | POA: Insufficient documentation

## 2011-06-22 DIAGNOSIS — E119 Type 2 diabetes mellitus without complications: Secondary | ICD-10-CM | POA: Insufficient documentation

## 2011-06-22 NOTE — Progress Notes (Signed)
*  PRELIMINARY RESULTS* Echocardiogram 2D Echocardiogram has been performed.  Conrad Garrard 06/22/2011, 8:19 AM

## 2011-06-23 ENCOUNTER — Encounter: Payer: Self-pay | Admitting: *Deleted

## 2011-06-23 ENCOUNTER — Encounter: Payer: Self-pay | Admitting: Cardiology

## 2011-08-15 ENCOUNTER — Other Ambulatory Visit: Payer: Self-pay | Admitting: *Deleted

## 2011-08-15 MED ORDER — AMIODARONE HCL 200 MG PO TABS: 100.0000 mg | ORAL_TABLET | Freq: Every day | ORAL | Status: DC

## 2011-10-03 ENCOUNTER — Other Ambulatory Visit: Payer: Self-pay | Admitting: Cardiology

## 2011-10-05 ENCOUNTER — Ambulatory Visit (HOSPITAL_COMMUNITY)
Admission: RE | Admit: 2011-10-05 | Discharge: 2011-10-05 | Disposition: A | Discharge status: Home / Self Care | Payer: Medicare Other | Source: Ambulatory Visit | Attending: Pulmonary Disease | Admitting: Pulmonary Disease

## 2011-10-05 ENCOUNTER — Other Ambulatory Visit (HOSPITAL_COMMUNITY): Payer: Self-pay | Admitting: Pulmonary Disease

## 2011-10-05 DIAGNOSIS — M545 Low back pain, unspecified: Secondary | ICD-10-CM

## 2011-10-05 DIAGNOSIS — M4120 Other idiopathic scoliosis, site unspecified: Secondary | ICD-10-CM | POA: Insufficient documentation

## 2011-10-13 ENCOUNTER — Telehealth (HOSPITAL_COMMUNITY): Payer: Self-pay | Admitting: Dietician

## 2011-10-13 NOTE — Telephone Encounter (Signed)
Appointment scheduled for 10/19/11 at 10:00 AM.

## 2011-10-13 NOTE — Telephone Encounter (Signed)
Received referral from Dr. Hawkins for dx: diabetes.  

## 2011-10-13 NOTE — Telephone Encounter (Signed)
Mailed appointment confirmation letter to pt home via [place] [employer].

## 2011-10-19 ENCOUNTER — Encounter (HOSPITAL_COMMUNITY): Payer: Self-pay | Admitting: Dietician

## 2011-10-19 NOTE — Progress Notes (Signed)
Outpatient Initial Nutrition Assessment  Date:10/19/2011   Time: 10:00 AM  Referring Physician: Dr. Juanetta Gosling Reason for Visit: diabetes  Nutrition Assessment:  Height: 4\' 11"  (149.9 cm)   Weight: 109 lb (49.442 kg)   IBW: 98# %IBW: 111% UBW: 109# %UBW: 100%  Body mass index is 22.02 kg/(m^2).  Goal Weight: Maintainence Weight hx: Pt reports her weight has always been stable.  Estimated nutritional needs: 1175-1285 kcals daily, 40-50 grams protein daily, 1.2-1.3 L fluid daily  PMH:  Past Medical History  Diagnosis Date  . Cardiomyopathy, nonischemic     Nl cors in 1989 and 1998; CHF in 12/97; EF of 40% in 5/01 and 7/03, 25% in 9/04 and 4/08.  systolic murmur without valvular abnormalities by echo; refused automatic implantable cardiac defibrillator  . Left bundle branch block   . Hypertension   . Hyperlipidemia   . Diabetes mellitus, type II     No insulin  . Cerebrovascular disease     Duplex study in 12/2006 shows atherosclerosis without focal stenosis  . Atrial flutter     Versus ventricular tachycardia; treated with amiodarone  . Gastroesophageal reflux disease     Medications:  Current Outpatient Rx  Name Route Sig Dispense Refill  . AMIODARONE HCL 200 MG PO TABS Oral Take 0.5 tablets (100 mg total) by mouth daily. 15 tablet 6  . ASPIRIN 81 MG PO TABS Oral Take 81 mg by mouth daily.      Marland Kitchen CALCIUM GLUCONATE 500 MG PO TABS Oral Take 500 mg by mouth 2 (two) times daily.      Marland Kitchen DIGOXIN 0.125 MG PO TABS  TAKE ONE (1) TABLET EACH DAY 30 tablet 6  . OMEGA-3 FATTY ACIDS 1000 MG PO CAPS Oral Take 1 capsule by mouth 3 (three) times daily.      . FUROSEMIDE 40 MG PO TABS  60 mg (1 1/2 tablet daily) 135 tablet 3  . GLIPIZIDE 5 MG PO TABS Oral Take 5 mg by mouth 2 (two) times daily.      Marland Kitchen LEVOTHYROXINE SODIUM 75 MCG PO TABS Oral Take 75 mcg by mouth daily.      Marland Kitchen LOSARTAN POTASSIUM-HCTZ 100-25 MG PO TABS Oral Take 1 tablet by mouth daily.    Marland Kitchen OMEPRAZOLE 20 MG PO CPDR Oral Take 20  mg by mouth 2 (two) times daily.      Marland Kitchen POTASSIUM CHLORIDE 20 MEQ PO PACK Oral Take 20 mEq by mouth 2 (two) times daily.      Marland Kitchen SIMVASTATIN 40 MG PO TABS Oral Take 40 mg by mouth at bedtime.      . SPIRONOLACTONE 25 MG PO TABS Oral Take 25 mg by mouth daily.        Labs: CMP     Component Value Date/Time   NA 133* 12/06/2010 0816   K 4.2 12/06/2010 0816   CL 93* 12/06/2010 0816   CO2 26 12/06/2010 0816   GLUCOSE 163* 12/06/2010 0816   BUN 15 12/06/2010 0816   CREATININE 0.57 12/06/2010 0816   CREATININE 0.60 04/02/2009 0950   CALCIUM 9.7 12/06/2010 0816   PROT 7.1 12/06/2010 0816   ALBUMIN 4.8 12/06/2010 0816   AST 19 12/06/2010 0816   ALT 19 12/06/2010 0816   ALKPHOS 51 12/06/2010 0816   BILITOT 0.3 12/06/2010 0816   GFRNONAA >60 05/08/2008 1210   GFRAA  Value: >60        The eGFR has been calculated using the MDRD equation. This calculation has  not been validated in all clinical 05/08/2008 1210    Lipid Panel     Component Value Date/Time   CHOL 119 04/02/2009 0950   TRIG 172 04/02/2009 0950   HDL 34 04/02/2009 0950   CHOLHDL 5.1 05/09/2008 0332   VLDL 42* 05/09/2008 0332   LDLCALC 51 04/02/2009 0950     Lab Results  Component Value Date   HGBA1C 7.0 04/02/2009   HGBA1C 7.1 09/24/2008   Lab Results  Component Value Date   LDLCALC 51 04/02/2009   CREATININE 0.57 12/06/2010     Lifestyle/ social habits: Ms. Cuttino lives in [place] with her grandson. She is a widow. She is a retired Copywriter, advertising. She denies any stress in her life. Noted her last visit to RD was 03/10/2005 for diabetes class.   Nutrition hx/habits: Ms. Binning reports that she watches her diet very carefully. She is surprised that her Hgb A1c is high, as she has remained on a very consistent diet, except the addition of sugar free ice cream. She watches her carbohydrate and sodium intake carefully. She weighs herself twice a day (AM and PM) as she is in a Heart Failure Program that she has participated in for several years.  She is physically active, walking 2 miles a day. She reports fasting blood sugars have "been going down", ranging from from 109-150. Post prandial blood sugars range from 124-150. She reports she has some trouble managing her diet for both diabetes and heart disease and has many diet related questions. She drinks mostly water and sugar free beverages.   Diet recall: Breakfast: cheerios with 2% milk, decaf coffee with creamer and sweet and low; Lunch: small [employer] hamburger and side salad; Dinner: grilled chicken, green beans, corn  Nutrition Diagnosis: Nutrition-related knowledge deficit r/t pt with many diet related questions AEB Hgb A1c: 7.4.   Nutrition Intervention: Nutrition rx: 1200 kcal NAS, diabetic diet; 3 meals per day; limit 1 starch per meal; low calorie beverages only; continue with current exercise regimen  Education/Counseling Provided: Reviewed diabetic and heart healthy diet principles with pt. Educated pt on plate method. Emphasized importance of portion sizes of carb containing foods, even sugar-free or no sugar added foods. Reviewed importance of reading food labels. Discussed meal and recipes ideas. Provided plate method handout and Managing Diabetes cookbook.   Understanding, Motivation, Ability to Follow Recommendations: Expect good compliance.   Monitoring and Evaluation: Goals: 1) Weight maintenance; 2) Hgb A1c < 7.0  Recommendations:1) Watch portions of sugar free foods; 2) Consider pre-portioned cups of fruit, ice cream, pudding, jello, etc to ensure proper serving size  F/U: PRN. Provided RD contact information  Orlene Plum, RD  10/19/2011  Time: 10:00 AM

## 2011-10-27 ENCOUNTER — Ambulatory Visit (INDEPENDENT_AMBULATORY_CARE_PROVIDER_SITE_OTHER): Payer: Medicare Other | Admitting: Otolaryngology

## 2011-10-27 DIAGNOSIS — H9319 Tinnitus, unspecified ear: Secondary | ICD-10-CM

## 2011-10-27 DIAGNOSIS — H903 Sensorineural hearing loss, bilateral: Secondary | ICD-10-CM

## 2011-10-27 DIAGNOSIS — R42 Dizziness and giddiness: Secondary | ICD-10-CM

## 2011-10-27 DIAGNOSIS — H814 Vertigo of central origin: Secondary | ICD-10-CM

## 2011-11-15 ENCOUNTER — Other Ambulatory Visit (HOSPITAL_COMMUNITY): Payer: Self-pay | Admitting: Pulmonary Disease

## 2011-11-15 DIAGNOSIS — Z139 Encounter for screening, unspecified: Secondary | ICD-10-CM

## 2011-12-19 ENCOUNTER — Ambulatory Visit (HOSPITAL_COMMUNITY)
Admission: RE | Admit: 2011-12-19 | Discharge: 2011-12-19 | Disposition: A | Discharge status: Home / Self Care | Payer: Medicare Other | Source: Ambulatory Visit | Attending: Pulmonary Disease | Admitting: Pulmonary Disease

## 2011-12-19 DIAGNOSIS — Z1231 Encounter for screening mammogram for malignant neoplasm of breast: Secondary | ICD-10-CM | POA: Insufficient documentation

## 2011-12-19 DIAGNOSIS — Z139 Encounter for screening, unspecified: Secondary | ICD-10-CM

## 2012-03-09 ENCOUNTER — Other Ambulatory Visit: Payer: Self-pay | Admitting: Cardiology

## 2012-03-09 MED ORDER — AMIODARONE HCL 200 MG PO TABS: 100.0000 mg | ORAL_TABLET | Freq: Every day | ORAL | Status: DC

## 2012-04-30 ENCOUNTER — Other Ambulatory Visit: Payer: Self-pay | Admitting: Cardiology

## 2012-06-14 ENCOUNTER — Ambulatory Visit (INDEPENDENT_AMBULATORY_CARE_PROVIDER_SITE_OTHER): Payer: Medicare Other | Admitting: Cardiology

## 2012-06-14 ENCOUNTER — Ambulatory Visit (HOSPITAL_COMMUNITY)
Admission: RE | Admit: 2012-06-14 | Discharge: 2012-06-14 | Disposition: A | Discharge status: Home / Self Care | Payer: Medicare Other | Source: Ambulatory Visit | Attending: Cardiology | Admitting: Cardiology

## 2012-06-14 ENCOUNTER — Encounter: Payer: Self-pay | Admitting: Cardiology

## 2012-06-14 VITALS — BP 112/76 | HR 64 | Ht 61.0 in | Wt 105.5 lb

## 2012-06-14 DIAGNOSIS — I428 Other cardiomyopathies: Secondary | ICD-10-CM

## 2012-06-14 DIAGNOSIS — I679 Cerebrovascular disease, unspecified: Secondary | ICD-10-CM

## 2012-06-14 DIAGNOSIS — Z79899 Other long term (current) drug therapy: Secondary | ICD-10-CM | POA: Insufficient documentation

## 2012-06-14 DIAGNOSIS — I472 Ventricular tachycardia: Secondary | ICD-10-CM

## 2012-06-14 DIAGNOSIS — I447 Left bundle-branch block, unspecified: Secondary | ICD-10-CM

## 2012-06-14 DIAGNOSIS — I10 Essential (primary) hypertension: Secondary | ICD-10-CM

## 2012-06-14 DIAGNOSIS — E119 Type 2 diabetes mellitus without complications: Secondary | ICD-10-CM

## 2012-06-14 DIAGNOSIS — Z5181 Encounter for therapeutic drug level monitoring: Secondary | ICD-10-CM | POA: Insufficient documentation

## 2012-06-14 DIAGNOSIS — E785 Hyperlipidemia, unspecified: Secondary | ICD-10-CM

## 2012-06-14 DIAGNOSIS — I509 Heart failure, unspecified: Secondary | ICD-10-CM | POA: Insufficient documentation

## 2012-06-14 NOTE — Progress Notes (Deleted)
Name: Donna [name]    DOB: 03-Jun-1941  Age: 72 y.o.  MR#: [id number]       PCP:  Fredirick Maudlin, MD      Insurance: @PAYORNAME @   CC:   No chief complaint on file.  Echo 06/22/11 No recent labs Medication bottles reviewed  VS BP 112/76  Pulse 64  Ht 5\' 1"  (1.549 m)  Wt 105 lb 8 oz (47.854 kg)  BMI 19.93 kg/m2  SpO2 95%  Weights Current Weight  06/14/12 105 lb 8 oz (47.854 kg)  10/19/11 109 lb (49.442 kg)  06/15/11 108 lb 3.2 oz (49.079 kg)    Blood Pressure  BP Readings from Last 3 Encounters:  06/14/12 112/76  06/15/11 135/72  06/09/10 143/76     Admit date:  (Not on file) Last encounter with RMR:  04/30/2012   Allergy Allergies  Allergen Reactions  . Codeine     REACTION: rapid HB  . Decongestant (Pseudoephedrine Hcl Er)     Heart races    Current Outpatient Prescriptions  Medication Sig Dispense Refill  . amiodarone (PACERONE) 200 MG tablet Take 0.5 tablets (100 mg total) by mouth daily.  15 tablet  6  . aspirin 81 MG tablet Take 81 mg by mouth daily.        . calcium gluconate 500 MG tablet Take 500 mg by mouth 2 (two) times daily.        . digoxin (LANOXIN) 0.125 MG tablet TAKE ONE (1) TABLET EACH DAY  30 tablet  2  . fish oil-omega-3 fatty acids 1000 MG capsule Take 1 capsule by mouth 3 (three) times daily.        . furosemide (LASIX) 40 MG tablet 60 mg (1 1/2 tablet daily)  135 tablet  3  . levothyroxine (SYNTHROID, LEVOTHROID) 75 MCG tablet Take 75 mcg by mouth daily.        Marland Kitchen losartan-hydrochlorothiazide (HYZAAR) 100-25 MG per tablet Take 1 tablet by mouth daily.      Marland Kitchen omeprazole (PRILOSEC) 20 MG capsule Take 20 mg by mouth 2 (two) times daily.        . potassium chloride (KLOR-CON) 20 MEQ packet Take 20 mEq by mouth 2 (two) times daily.        . saxagliptin HCl (ONGLYZA) 5 MG TABS tablet Take 5 mg by mouth daily.      . simvastatin (ZOCOR) 40 MG tablet Take 40 mg by mouth at bedtime.        Marland Kitchen spironolactone (ALDACTONE) 25 MG tablet Take 25 mg by  mouth daily.          Discontinued Meds:    Medications Discontinued During This Encounter  Medication Reason  . glipiZIDE (GLUCOTROL) 5 MG tablet Error    Patient Active Problem List  Diagnosis  . HYPOTHYROIDISM  . HYPERLIPIDEMIA  . LEFT BUNDLE BRANCH BLOCK  . VENTRICULAR TACHYCARDIA  . CEREBROVASCULAR DISEASE  . GASTROESOPHAGEAL REFLUX DISEASE  . Cardiomyopathy, nonischemic  . Hypertension  . Diabetes mellitus, type II    LABS No visits with results within 3 Month(s) from this visit. Latest known visit with results is:  Orders Only on 12/06/2010  Component Date Value  . WBC 12/06/2010 5.5   . RBC 12/06/2010 3.92   . Hemoglobin 12/06/2010 12.9   . HCT 12/06/2010 38.4   . MCV 12/06/2010 98.0   . MCH 12/06/2010 32.9   . MCHC 12/06/2010 33.6   . RDW 12/06/2010 13.2   . Platelets 12/06/2010 218   .  Neutrophils Relative 12/06/2010 64   . Neutro Abs 12/06/2010 3.5   . Lymphocytes Relative 12/06/2010 26   . Lymphs Abs 12/06/2010 1.4   . Monocytes Relative 12/06/2010 8   . Monocytes Absolute 12/06/2010 0.4   . Eosinophils Relative 12/06/2010 2   . Eosinophils Absolute 12/06/2010 0.1   . Basophils Relative 12/06/2010 1   . Basophils Absolute 12/06/2010 0.0   . Smear Review 12/06/2010 Criteria for review not met   . Sodium 12/06/2010 133*  . Potassium 12/06/2010 4.2   . Chloride 12/06/2010 93*  . CO2 12/06/2010 26   . Glucose, Bld 12/06/2010 163*  . BUN 12/06/2010 15   . Creat 12/06/2010 0.57   . Total Bilirubin 12/06/2010 0.3   . Alkaline Phosphatase 12/06/2010 51   . AST 12/06/2010 19   . ALT 12/06/2010 19   . Total Protein 12/06/2010 7.1   . Albumin 12/06/2010 4.8   . Calcium 12/06/2010 9.7   . TSH 12/06/2010 1.720      Results for this Opt Visit:     Results for orders placed in visit on 12/06/10  CBC WITH DIFFERENTIAL      Component Value Range   WBC 5.5  4.0 - 10.5 K/uL   RBC 3.92  3.87 - 5.11 MIL/uL   Hemoglobin 12.9  12.0 - 15.0 g/dL   HCT 16.1   09.6 - 04.5 %   MCV 98.0  78.0 - 100.0 fL   MCH 32.9  26.0 - 34.0 pg   MCHC 33.6  30.0 - 36.0 g/dL   RDW 40.9  81.1 - 91.4 %   Platelets 218  150 - 400 K/uL   Neutrophils Relative 64  43 - 77 %   Neutro Abs 3.5  1.7 - 7.7 K/uL   Lymphocytes Relative 26  12 - 46 %   Lymphs Abs 1.4  0.7 - 4.0 K/uL   Monocytes Relative 8  3 - 12 %   Monocytes Absolute 0.4  0.1 - 1.0 K/uL   Eosinophils Relative 2  0 - 5 %   Eosinophils Absolute 0.1  0.0 - 0.7 K/uL   Basophils Relative 1  0 - 1 %   Basophils Absolute 0.0  0.0 - 0.1 K/uL   Smear Review Criteria for review not met    COMPREHENSIVE METABOLIC PANEL      Component Value Range   Sodium 133 (*) 135 - 145 mEq/L   Potassium 4.2  3.5 - 5.3 mEq/L   Chloride 93 (*) 96 - 112 mEq/L   CO2 26  19 - 32 mEq/L   Glucose, Bld 163 (*) 70 - 99 mg/dL   BUN 15  6 - 23 mg/dL   Creat 7.82  9.56 - 2.13 mg/dL   Total Bilirubin 0.3  0.3 - 1.2 mg/dL   Alkaline Phosphatase 51  39 - 117 U/L   AST 19  0 - 37 U/L   ALT 19  0 - 35 U/L   Total Protein 7.1  6.0 - 8.3 g/dL   Albumin 4.8  3.5 - 5.2 g/dL   Calcium 9.7  8.4 - 08.6 mg/dL  TSH      Component Value Range   TSH 1.720  0.350 - 4.500 uIU/mL    EKG Orders placed in visit on 06/14/12  . EKG 12-LEAD     Prior Assessment and Plan Problem List as of 06/14/2012            Cardiology Problems   HYPERLIPIDEMIA  Last Assessment & Plan Note   06/15/2011 Office Visit Signed 06/15/2011 11:18 AM by Kathlen Brunswick, MD    Adequate control of hyperlipidemia with current medication.    LEFT BUNDLE BRANCH BLOCK   VENTRICULAR TACHYCARDIA   Last Assessment & Plan Note   06/15/2011 Office Visit Signed 06/15/2011 11:23 AM by Kathlen Brunswick, MD    No arrhythmia documented for years with amiodarone therapy.  Corrected thyroid dysfunction and normal LFTs by recent monitoring laboratory studies.  Unremarkable chest x-ray one year ago.    CEREBROVASCULAR DISEASE   Last Assessment & Plan Note   06/15/2011 Office Visit  Signed 06/15/2011 10:20 AM by Kathlen Brunswick, MD    Asymptomatic carotid bruit with atherosclerosis but no focal stenosis on ultrasound study in 12/2006 No bruit heard in 2013-does not appear to be an active problem.       Cardiomyopathy, nonischemic   Last Assessment & Plan Note   06/15/2011 Office Visit Addendum 06/15/2011 11:25 AM by Kathlen Brunswick, MD    Patient's symptomatic status continues to be excellent.  Left ventricular systolic function has not been assessed for the past 5 years.  Repeat echocardiography will be obtained.  Systolic murmur is fairly impressive and almost certainly represents mitral or tricuspid regurgitation.  This issue will be addressed by echocardiography as well.    Hypertension   Last Assessment & Plan Note   06/15/2011 Office Visit Addendum 06/15/2011 11:18 AM by Kathlen Brunswick, MD    Blood pressure control has been a byproduct of treatment of cardiomyopathy and continues to be adequate.      Other   HYPOTHYROIDISM   Last Assessment & Plan Note   06/15/2011 Office Visit Addendum 06/22/2011 12:12 PM by Kathlen Brunswick, MD    Euthyroid clinically and chemically as assessed by a normal TSH 7 months ago, on 0.075 mg of levothyroxine per day    GASTROESOPHAGEAL REFLUX DISEASE   Diabetes mellitus, type II       Imaging: No results found.   FRS Calculation: Score not calculated. Missing: Total Cholesterol

## 2012-06-14 NOTE — Progress Notes (Signed)
Patient ID: Donna [name], female   DOB: [birth year], 72 y.o.   MRN: [id number]  HPI: Scheduled return visit for this delightful woman who has done extremely well during most of the 25 years since she was diagnosed with a severe nonischemic cardiomyopathy.  Echocardiogram one year ago verified continued severe LV dysfunction with an EF of 20%.  Despite this, patient does all of her housework and self-care without cardiopulmonary symptoms.  She has had some deterioration in control of diabetes, now improved after consultation with Dr. Fransico Him.  She has not required urgent medical care nor developed new medical problems.   Prior to Admission medications   Medication Sig Start Date End Date Taking? Authorizing Provider  amiodarone (PACERONE) 200 MG tablet Take 0.5 tablets (100 mg total) by mouth daily. 03/09/12  Yes Kathlen Brunswick, MD  aspirin 81 MG tablet Take 81 mg by mouth daily.     Yes Historical Provider, MD  calcium gluconate 500 MG tablet Take 500 mg by mouth 2 (two) times daily.     Yes Historical Provider, MD  digoxin (LANOXIN) 0.125 MG tablet TAKE ONE (1) TABLET EACH DAY 04/30/12  Yes Kathlen Brunswick, MD  fish oil-omega-3 fatty acids 1000 MG capsule Take 1 capsule by mouth 3 (three) times daily.     Yes Historical Provider, MD  furosemide (LASIX) 40 MG tablet 60 mg (1 1/2 tablet daily) 06/15/11  Yes Kathlen Brunswick, MD  levothyroxine (SYNTHROID, LEVOTHROID) 75 MCG tablet Take 75 mcg by mouth daily.     Yes Historical Provider, MD  losartan-hydrochlorothiazide (HYZAAR) 100-25 MG per tablet Take 1 tablet by mouth daily.   Yes Historical Provider, MD  omeprazole (PRILOSEC) 20 MG capsule Take 20 mg by mouth 2 (two) times daily.     Yes Historical Provider, MD  potassium chloride (KLOR-CON) 20 MEQ packet Take 20 mEq by mouth 2 (two) times daily.     Yes Historical Provider, MD  saxagliptin HCl (ONGLYZA) 5 MG TABS tablet Take 5 mg by mouth daily.   Yes Historical Provider, MD  simvastatin (ZOCOR)  40 MG tablet Take 40 mg by mouth at bedtime.     Yes Historical Provider, MD  spironolactone (ALDACTONE) 25 MG tablet Take 25 mg by mouth daily.     Yes Historical Provider, MD   Allergies  Allergen Reactions  . Codeine     REACTION: rapid HB  . Decongestant (Pseudoephedrine Hcl Er)     Heart races  Past medical history, social history, and family history reviewed and updated.  ROS: She has recently lost a few pounds, which she attributes to adverse effects of hypoglycemic agents.  She denies dyspnea, orthopnea, chest pain, exercise intolerance, peripheral edema or syncope.  Colonoscopy performed 5-6 years ago and was negative.  She denies recent chest x-ray, but has had laboratory studies with both her PCP and with Dr. Fransico Him.  All other systems reviewed and are negative.  PHYSICAL EXAM: BP 112/76  Pulse 64  Ht 5\' 1"  (1.549 m)  Wt 47.854 kg (105 lb 8 oz)  BMI 19.93 kg/m2  SpO2 95%  General-Well developed; no acute distress Body habitus-slight Neck-No JVD; no carotid bruits Lungs-clear lung fields; resonant to percussion; mild kyphosis Cardiovascular-normal PMI; normal S1 and S2; low pitched grade 2-3/6 early and midsystolic murmur, most prominent at the cardiac base and left sternal border. Abdomen-normal bowel sounds; soft and non-tender without masses or organomegaly Musculoskeletal-No deformities, no cyanosis or clubbing Neurologic-Normal cranial nerves; symmetric strength and  tone Skin-Warm, no significant lesions Extremities-distal pulses intact; no edema  EKG: normal sinus rhythm; left axis deviation; left bundle branch block   ASSESSMENT AND PLAN:  [place] Bing, MD 06/14/2012 12:56 PM

## 2012-06-14 NOTE — Patient Instructions (Addendum)
Your physician recommends that you schedule a follow-up appointment in: 1 year  A chest x-ray takes a picture of the organs and structures inside the chest, including the heart, lungs, and blood vessels. This test can show several things, including, whether the heart is enlarges; whether fluid is building up in the lungs; and whether pacemaker / defibrillator leads are still in place.

## 2012-06-15 ENCOUNTER — Encounter: Payer: Self-pay | Admitting: Cardiology

## 2012-06-15 NOTE — Assessment & Plan Note (Signed)
Asymptomatic carotid bruit with atherosclerosis but no focal stenosis on ultrasound study in 12/2006 No bruit heard in 2013-does not appear to be an active problem.

## 2012-06-15 NOTE — Assessment & Plan Note (Addendum)
Control of diabetes has not been a major problem.  Recent CBGs of around 160 as reported by the patient suggests that there is still some room for improvement.

## 2012-06-15 NOTE — Assessment & Plan Note (Signed)
Recent laboratory studies from patient's multiple physicians will be sought.  If any necessary testing for monitoring of amiodarone therapy has not been performed, we will arrange for it to be completed.  Chest x-ray has been requested.

## 2012-06-15 NOTE — Assessment & Plan Note (Signed)
Lipid profile has been excellent with current therapy, most recently in 03/2011.  Dr. Juanetta Gosling is managing and monitoring this issue.

## 2012-06-15 NOTE — Assessment & Plan Note (Signed)
Cardiac medications utilized to treat cardiomyopathy have provided excellent control of hypertension for many years.

## 2012-06-15 NOTE — Assessment & Plan Note (Signed)
The patient remains well compensated with respect to nonischemic cardiomyopathy.  EF has been somewhat variable in the past, but has been consistently below 30% for at least the past decade.  Despite this, Ms. Walraven's functional status is excellent.  Current therapy will be maintained.  Electrolytes and renal function will be monitored.

## 2012-06-18 ENCOUNTER — Encounter: Payer: Self-pay | Admitting: *Deleted

## 2012-06-19 ENCOUNTER — Other Ambulatory Visit: Payer: Self-pay | Admitting: Cardiology

## 2012-06-20 NOTE — Telephone Encounter (Signed)
rx sent to pharmacy by e-script per pt recently seen in office and advised to f/u in a year

## 2012-07-06 ENCOUNTER — Encounter: Payer: Self-pay | Admitting: Cardiology

## 2012-07-30 ENCOUNTER — Other Ambulatory Visit: Payer: Self-pay | Admitting: Cardiology

## 2012-10-01 ENCOUNTER — Other Ambulatory Visit: Payer: Self-pay | Admitting: Cardiology

## 2012-11-12 ENCOUNTER — Other Ambulatory Visit (HOSPITAL_COMMUNITY): Payer: Self-pay | Admitting: Pulmonary Disease

## 2012-11-12 DIAGNOSIS — Z139 Encounter for screening, unspecified: Secondary | ICD-10-CM

## 2012-12-24 ENCOUNTER — Ambulatory Visit (HOSPITAL_COMMUNITY)
Admission: RE | Admit: 2012-12-24 | Discharge: 2012-12-24 | Disposition: A | Discharge status: Home / Self Care | Payer: Medicare Other | Source: Ambulatory Visit | Attending: Pulmonary Disease | Admitting: Pulmonary Disease

## 2012-12-24 DIAGNOSIS — Z1231 Encounter for screening mammogram for malignant neoplasm of breast: Secondary | ICD-10-CM | POA: Insufficient documentation

## 2012-12-24 DIAGNOSIS — Z139 Encounter for screening, unspecified: Secondary | ICD-10-CM

## 2013-01-17 ENCOUNTER — Telehealth: Payer: Self-pay

## 2013-01-17 NOTE — Telephone Encounter (Signed)
Routing to Susan to nic. 

## 2013-01-17 NOTE — Telephone Encounter (Signed)
Pt was referred by Dr. Juanetta Gosling for screening colonoscopy. ( her last one was 09/2006 by RMR). She is not having any GI problems and no FH of colon cancer/polyps, no anemia.   Dr. Juanetta Gosling just wanted to make sure she did not overlook it if it was time. Pt will be on recall for 09/2016 and said she will call if she has any problems before then. Copy of last report sent do Dr. Juanetta Gosling.

## 2013-01-18 NOTE — Telephone Encounter (Signed)
Reminder in epic °

## 2013-01-18 NOTE — Telephone Encounter (Signed)
Agree 

## 2013-06-27 ENCOUNTER — Other Ambulatory Visit: Payer: Self-pay

## 2013-06-27 MED ORDER — DIGOXIN 125 MCG PO TABS: ORAL_TABLET | ORAL | Status: DC

## 2013-06-28 ENCOUNTER — Encounter (INDEPENDENT_AMBULATORY_CARE_PROVIDER_SITE_OTHER): Payer: Self-pay

## 2013-06-28 ENCOUNTER — Ambulatory Visit (INDEPENDENT_AMBULATORY_CARE_PROVIDER_SITE_OTHER): Payer: Medicare Other | Admitting: Cardiology

## 2013-06-28 ENCOUNTER — Encounter: Payer: Self-pay | Admitting: Cardiology

## 2013-06-28 VITALS — BP 138/55 | HR 72 | Ht 61.0 in | Wt 103.0 lb

## 2013-06-28 DIAGNOSIS — I4729 Other ventricular tachycardia: Secondary | ICD-10-CM

## 2013-06-28 DIAGNOSIS — I472 Ventricular tachycardia: Secondary | ICD-10-CM

## 2013-06-28 DIAGNOSIS — E785 Hyperlipidemia, unspecified: Secondary | ICD-10-CM

## 2013-06-28 DIAGNOSIS — I428 Other cardiomyopathies: Secondary | ICD-10-CM

## 2013-06-28 DIAGNOSIS — I447 Left bundle-branch block, unspecified: Secondary | ICD-10-CM

## 2013-06-28 DIAGNOSIS — I10 Essential (primary) hypertension: Secondary | ICD-10-CM

## 2013-06-28 MED ORDER — CARVEDILOL 3.125 MG PO TABS
3.1250 mg | ORAL_TABLET | Freq: Two times a day (BID) | ORAL | Status: DC
Start: 2013-06-28 — End: 2013-10-01

## 2013-06-28 NOTE — Progress Notes (Signed)
Clinical Summary Donna [name] is a 73 y.o.female former patient of Dr Lattie Haw, this is our first visit together. She is seen for the following medical problems.   1. NICM - LVEF 20% by echo 06/2011,  - she has refused ICD previously - denies any SOB or DOE. Walks 2 miles daily without troubles.  - no orthopnea, no PND, no LE edema - compliant with meds, she is not on beta blocker for unclear reasons. She states she thinks several years ago coreg was titrated aggressively, and she felt very bad on that medication.    2. HTN - checks bp at home, typically around 120s/60s - compliant with meds  3. Hyperlipidemia - compliant with zocor - no recent panel in our system, followed by Dr Luan Pulling.  4. Ventricular tachycardia - on amiodarone daily, she has refused a ICD in the past     Past Medical History  Diagnosis Date  . Cardiomyopathy, nonischemic     Nl cors in 1989 and 1998; CHF in 12/97; EF of 40% in 5/01 and 7/03, 25% in 9/04 and 4/08.  systolic murmur without valvular abnormalities by echo; refused automatic implantable cardiac defibrillator  . Left bundle Wade Asebedo block   . Hypertension   . Hyperlipidemia   . Diabetes mellitus, type II     No insulin  . Cerebrovascular disease     Duplex study in 12/2006 shows atherosclerosis without focal stenosis  . Atrial flutter     Versus ventricular tachycardia; treated with amiodarone  . Gastroesophageal reflux disease      Allergies  Allergen Reactions  . Codeine     REACTION: rapid HB  . Decongestant [Pseudoephedrine Hcl Er]     Heart races     Current Outpatient Prescriptions  Medication Sig Dispense Refill  . amiodarone (PACERONE) 200 MG tablet TAKE ONE-HALF TABLET DAILY  30 tablet  5  . aspirin 81 MG tablet Take 81 mg by mouth daily.        . calcium gluconate 500 MG tablet Take 500 mg by mouth 2 (two) times daily.        . digoxin (LANOXIN) 0.125 MG tablet TAKE ONE TABLET BY MOUTH EVERY DAY  30 tablet  6  .  fish oil-omega-3 fatty acids 1000 MG capsule Take 1 capsule by mouth 3 (three) times daily.        . furosemide (LASIX) 40 MG tablet TAKE 1 & 1/2 TABLETS DAILY  45 tablet  11  . levothyroxine (SYNTHROID, LEVOTHROID) 75 MCG tablet Take 75 mcg by mouth daily.        Marland Kitchen losartan-hydrochlorothiazide (HYZAAR) 100-25 MG per tablet Take 1 tablet by mouth daily.      Marland Kitchen omeprazole (PRILOSEC) 20 MG capsule Take 20 mg by mouth 2 (two) times daily.        . potassium chloride (KLOR-CON) 20 MEQ packet Take 20 mEq by mouth 2 (two) times daily.        . saxagliptin HCl (ONGLYZA) 5 MG TABS tablet Take 5 mg by mouth daily.      . simvastatin (ZOCOR) 40 MG tablet Take 40 mg by mouth at bedtime.        Marland Kitchen spironolactone (ALDACTONE) 25 MG tablet Take 25 mg by mouth daily.         No current facility-administered medications for this visit.     Past Surgical History  Procedure Laterality Date  . Hernia repair      x2  .  Abdominal hysterectomy  2006  . Colonoscopy  2008     Allergies  Allergen Reactions  . Codeine     REACTION: rapid HB  . Decongestant [Pseudoephedrine Hcl Er]     Heart races      Family History  Problem Relation Age of Onset  . Diabetes Mother   . Kidney disease Mother   . Diabetes Sister   . Heart attack Brother      Social History Ms. Pester reports that she has never smoked. She does not have any smokeless tobacco history on file. Ms. Tolleson reports that she does not drink alcohol.   Review of Systems CONSTITUTIONAL: No weight loss, fever, chills, weakness or fatigue.  HEENT: Eyes: No visual loss, blurred vision, double vision or yellow sclerae.No hearing loss, sneezing, congestion, runny nose or sore throat.  SKIN: No rash or itching.  CARDIOVASCULAR: per HPI RESPIRATORY: per HPI GASTROINTESTINAL: No anorexia, nausea, vomiting or diarrhea. No abdominal pain or blood.  GENITOURINARY: No burning on urination, no polyuria NEUROLOGICAL: No headache, dizziness,  syncope, paralysis, ataxia, numbness or tingling in the extremities. No change in bowel or bladder control.  MUSCULOSKELETAL: No muscle, back pain, joint pain or stiffness.  LYMPHATICS: No enlarged nodes. No history of splenectomy.  PSYCHIATRIC: No history of depression or anxiety.  ENDOCRINOLOGIC: No reports of sweating, cold or heat intolerance. No polyuria or polydipsia.  Marland Kitchen   Physical Examination p 72 bp 138/55 Wt 102 lbs BMI 19 Gen: resting comfortably, no acute distress HEENT: no scleral icterus, pupils equal round and reactive, no palptable cervical adenopathy,  CV: RRR, 3/6 systolic murmur at apex, no JVD Resp: Clear to auscultation bilaterally GI: abdomen is soft, non-tender, non-distended, normal bowel sounds, no hepatosplenomegaly MSK: extremities are warm, no edema.  Skin: warm, no rash Neuro:  no focal deficits Psych: appropriate affect   Diagnostic Studies 06/2011 Echo LVEF 20%, mildly dilated, mild LVH, grade I diastolic dysfunction, mild to mod MR, PASP 36    Assessment and Plan  1. NICM - start low dose coreg, titrate as tolerated at follow up visits - continue other current meds  2. HTN - at goal, continue current meds  3. Hyperlipidemia - will request recent lipid panel from PCP - continue current statin  4. Ventricular tachycardia - continue amiodarone, she continues to refuse ICD      Arnoldo Lenis, M.D., F.A.C.C.

## 2013-06-28 NOTE — Patient Instructions (Addendum)
Your physician recommends that you schedule a follow-up appointment in: [place] has recommended you make the following change in your medication:   1) START COREG 3.125MG  TWICE DAILY

## 2013-07-01 ENCOUNTER — Encounter: Payer: Self-pay | Admitting: Cardiology

## 2013-07-24 ENCOUNTER — Ambulatory Visit: Payer: Medicare Other | Admitting: Dietician

## 2013-08-06 ENCOUNTER — Encounter: Payer: Self-pay | Admitting: Cardiology

## 2013-08-06 ENCOUNTER — Ambulatory Visit (INDEPENDENT_AMBULATORY_CARE_PROVIDER_SITE_OTHER): Payer: Medicare Other | Admitting: Cardiology

## 2013-08-06 VITALS — BP 110/63 | HR 61 | Ht 61.0 in | Wt 101.1 lb

## 2013-08-06 DIAGNOSIS — I472 Ventricular tachycardia: Secondary | ICD-10-CM

## 2013-08-06 DIAGNOSIS — E785 Hyperlipidemia, unspecified: Secondary | ICD-10-CM

## 2013-08-06 DIAGNOSIS — I10 Essential (primary) hypertension: Secondary | ICD-10-CM

## 2013-08-06 DIAGNOSIS — I4729 Other ventricular tachycardia: Secondary | ICD-10-CM

## 2013-08-06 DIAGNOSIS — I428 Other cardiomyopathies: Secondary | ICD-10-CM

## 2013-08-06 MED ORDER — LOSARTAN POTASSIUM 100 MG PO TABS: 100.0000 mg | ORAL_TABLET | Freq: Every day | ORAL | Status: DC

## 2013-08-06 NOTE — Progress Notes (Signed)
Clinical Summary Donna [name] is a 73 y.o.female seen today for follow up of the following medical problems.   1. NICM  - long history of cardiomyopathy, prior caths without obstructive disease - LVEF 20% by echo 06/2011,  - she has refused ICD previously  - denies any SOB or DOE. Walks 2 miles daily without troubles.  - no orthopnea, no PND, no LE edema  - compliant with meds, she apparently had fatigue on beta blockers sometime in the past and had not been continued on therapy. She states she thinks the doses were increased very quickly and that's why she developed symptoms - last visit started low dose coreg which she has tolerated other than some mild occasional dizziness.    2. HTN  - checks bp at home, typically around 110s/60s  - compliant with meds   3. Hyperlipidemia  - compliant with zocor  - last 03/2013 panel: TG 101 HDL 37 LDL 58  4. Ventricular tachycardia  - on amiodarone daily, she has refused a ICD in the past  Past Medical History  Diagnosis Date  . Cardiomyopathy, nonischemic     Nl cors in 1989 and 1998; CHF in 12/97; EF of 40% in 5/01 and 7/03, 25% in 9/04 and 4/08.  systolic murmur without valvular abnormalities by echo; refused automatic implantable cardiac defibrillator  . Left bundle [name] block   . Hypertension   . Hyperlipidemia   . Diabetes mellitus, type II     No insulin  . Cerebrovascular disease     Duplex study in 12/2006 shows atherosclerosis without focal stenosis  . Atrial flutter     Versus ventricular tachycardia; treated with amiodarone  . Gastroesophageal reflux disease      Allergies  Allergen Reactions  . Codeine     REACTION: rapid HB  . Decongestant [Pseudoephedrine Hcl Er]     Heart races     Current Outpatient Prescriptions  Medication Sig Dispense Refill  . amiodarone (PACERONE) 200 MG tablet TAKE ONE-HALF TABLET DAILY  30 tablet  5  . aspirin 81 MG tablet Take 81 mg by mouth daily.        . calcium gluconate  500 MG tablet Take 500 mg by mouth 2 (two) times daily.        . carvedilol (COREG) 3.125 MG tablet Take 1 tablet (3.125 mg total) by mouth 2 (two) times daily.  60 tablet  6  . digoxin (LANOXIN) 0.125 MG tablet TAKE ONE TABLET BY MOUTH EVERY DAY  30 tablet  6  . fish oil-omega-3 fatty acids 1000 MG capsule Take 1 capsule by mouth 3 (three) times daily.        . furosemide (LASIX) 40 MG tablet TAKE 1 & 1/2 TABLETS DAILY  45 tablet  11  . levothyroxine (SYNTHROID, LEVOTHROID) 75 MCG tablet Take 75 mcg by mouth daily.        Marland Kitchen losartan-hydrochlorothiazide (HYZAAR) 100-25 MG per tablet Take 1 tablet by mouth daily.      Marland Kitchen omeprazole (PRILOSEC) 20 MG capsule Take 20 mg by mouth 2 (two) times daily.        . potassium chloride (KLOR-CON) 20 MEQ packet Take 20 mEq by mouth 2 (two) times daily.        . simvastatin (ZOCOR) 40 MG tablet Take 40 mg by mouth at bedtime.        Marland Kitchen spironolactone (ALDACTONE) 25 MG tablet Take 25 mg by mouth daily.  No current facility-administered medications for this visit.     Past Surgical History  Procedure Laterality Date  . Hernia repair      x2  . Abdominal hysterectomy  2006  . Colonoscopy  2008     Allergies  Allergen Reactions  . Codeine     REACTION: rapid HB  . Decongestant [Pseudoephedrine Hcl Er]     Heart races      Family History  Problem Relation Age of Onset  . Diabetes Mother   . Kidney disease Mother   . Diabetes Sister   . Heart attack Brother      Social History Donna [name] reports that she has never smoked. She does not have any smokeless tobacco history on file. Donna [name] reports that she does not drink alcohol.   Review of Systems CONSTITUTIONAL: No weight loss, fever, chills, weakness or fatigue.  HEENT: Eyes: No visual loss, blurred vision, double vision or yellow sclerae.No hearing loss, sneezing, congestion, runny nose or sore throat.  SKIN: No rash or itching.  CARDIOVASCULAR: per HPI RESPIRATORY: No  shortness of breath, cough or sputum.  GASTROINTESTINAL: No anorexia, nausea, vomiting or diarrhea. No abdominal pain or blood.  GENITOURINARY: No burning on urination, no polyuria NEUROLOGICAL: No headache, dizziness, syncope, paralysis, ataxia, numbness or tingling in the extremities. No change in bowel or bladder control.  MUSCULOSKELETAL: No muscle, back pain, joint pain or stiffness.  LYMPHATICS: No enlarged nodes. No history of splenectomy.  PSYCHIATRIC: No history of depression or anxiety.  ENDOCRINOLOGIC: No reports of sweating, cold or heat intolerance. No polyuria or polydipsia.  Marland Kitchen   Physical Examination p 61 bp 110/63 Wt 101 lbs BMI 19 Gen: resting comfortably, no acute distress HEENT: no scleral icterus, pupils equal round and reactive, no palptable cervical adenopathy,  CV: RRR, no m/r/g, no JVD, no carotid bruits Resp: Clear to auscultation bilaterally GI: abdomen is soft, non-tender, non-distended, normal bowel sounds, no hepatosplenomegaly MSK: extremities are warm, no edema.  Skin: warm, no rash Neuro:  no focal deficits Psych: appropriate affect   Diagnostic Studies 06/2011 Echo  LVEF 20%, mildly dilated, mild LVH, grade I diastolic dysfunction, mild to mod MR, PASP 36     Assessment and Plan  1. NICM  - NYHA II, she has refused ICD in the past, she is euvolemic in clinic today - started low dose beta blocker last visit, tolerating with mild side effects, will continue current dose for now. Slow titration as tolerated given her age and side effects on higher doses previously.   2. HTN  - at goal, continue current meds  -stop hzaar (losartan/hctz) and start losartan 100mg  daily, she is already on lasix daily  3. Hyperlipidemia  - at goal, continue current statin   4. Ventricular tachycardia  - continue amiodarone, she continues to refuse ICD  Follow up 6 weeks.     Arnoldo Lenis, M.D., F.A.C.C.

## 2013-08-06 NOTE — Patient Instructions (Addendum)
Your physician wants you to follow-up in 6 weeks    Your physician has recommended you make the following change in your medication:   STOP Hyzaar  START Losartan 100 mg daily  Reduce your Aspirin to 81 mg daily

## 2013-08-06 NOTE — Addendum Note (Signed)
Addended by: Lewayne Bunting on: 08/06/2013 11:37 AM   Modules accepted: Orders

## 2013-09-13 ENCOUNTER — Other Ambulatory Visit (HOSPITAL_COMMUNITY): Payer: Self-pay | Admitting: Pulmonary Disease

## 2013-09-13 ENCOUNTER — Ambulatory Visit (HOSPITAL_COMMUNITY)
Admission: RE | Admit: 2013-09-13 | Discharge: 2013-09-13 | Disposition: A | Discharge status: Home / Self Care | Payer: Medicare Other | Source: Ambulatory Visit | Attending: Pulmonary Disease | Admitting: Pulmonary Disease

## 2013-09-13 DIAGNOSIS — M51379 Other intervertebral disc degeneration, lumbosacral region without mention of lumbar back pain or lower extremity pain: Secondary | ICD-10-CM | POA: Insufficient documentation

## 2013-09-13 DIAGNOSIS — M545 Low back pain, unspecified: Secondary | ICD-10-CM

## 2013-09-13 DIAGNOSIS — M5137 Other intervertebral disc degeneration, lumbosacral region: Secondary | ICD-10-CM | POA: Insufficient documentation

## 2013-09-13 DIAGNOSIS — Q762 Congenital spondylolisthesis: Secondary | ICD-10-CM | POA: Insufficient documentation

## 2013-09-13 DIAGNOSIS — M47817 Spondylosis without myelopathy or radiculopathy, lumbosacral region: Secondary | ICD-10-CM | POA: Insufficient documentation

## 2013-09-26 ENCOUNTER — Ambulatory Visit (HOSPITAL_COMMUNITY)
Admission: RE | Admit: 2013-09-26 | Discharge: 2013-09-26 | Disposition: A | Discharge status: Home / Self Care | Payer: Medicare Other | Source: Ambulatory Visit | Attending: Pulmonary Disease | Admitting: Pulmonary Disease

## 2013-09-26 DIAGNOSIS — R29898 Other symptoms and signs involving the musculoskeletal system: Secondary | ICD-10-CM

## 2013-09-26 DIAGNOSIS — R5383 Other fatigue: Secondary | ICD-10-CM

## 2013-09-26 DIAGNOSIS — M25559 Pain in unspecified hip: Secondary | ICD-10-CM | POA: Insufficient documentation

## 2013-09-26 DIAGNOSIS — M545 Low back pain, unspecified: Secondary | ICD-10-CM | POA: Insufficient documentation

## 2013-09-26 DIAGNOSIS — E119 Type 2 diabetes mellitus without complications: Secondary | ICD-10-CM | POA: Insufficient documentation

## 2013-09-26 DIAGNOSIS — I10 Essential (primary) hypertension: Secondary | ICD-10-CM | POA: Insufficient documentation

## 2013-09-26 DIAGNOSIS — R5381 Other malaise: Secondary | ICD-10-CM | POA: Insufficient documentation

## 2013-09-26 DIAGNOSIS — IMO0001 Reserved for inherently not codable concepts without codable children: Secondary | ICD-10-CM | POA: Insufficient documentation

## 2013-09-26 DIAGNOSIS — R269 Unspecified abnormalities of gait and mobility: Secondary | ICD-10-CM | POA: Insufficient documentation

## 2013-09-26 NOTE — Progress Notes (Addendum)
Physical Therapy Evaluation  Patient Details  Name: Donna [name] MRN: [id number] Date of Birth: Feb 23, 1941  Today's Date: 09/26/2013 Time: 7353-2992 PT Time Calculation (min): 45 min   Charges: 1 eval, 921-920 therapeutic Exercise           Visit#: 1 of 12  Re-eval: 10/26/13 Assessment Diagnosis: Low back pain secondary to limited hip and piriformis mobility resulting in pain when transitioning from sitting to standing.  Next MD Visit: 12/11/13 Dr. Luan Pulling Prior Therapy: no  Authorization: Medicare    Authorization Time Period:    Authorization Visit#:  1 of   12  Past Medical History:  Past Medical History  Diagnosis Date  . Cardiomyopathy, nonischemic     Nl cors in 1989 and 1998; CHF in 12/97; EF of 40% in 5/01 and 7/03, 25% in 9/04 and 4/08.  systolic murmur without valvular abnormalities by echo; refused automatic implantable cardiac defibrillator  . Left bundle branch block   . Hypertension   . Hyperlipidemia   . Diabetes mellitus, type II     No insulin  . Cerebrovascular disease     Duplex study in 12/2006 shows atherosclerosis without focal stenosis  . Atrial flutter     Versus ventricular tachycardia; treated with amiodarone  . Gastroesophageal reflux disease    Past Surgical History:  Past Surgical History  Procedure Laterality Date  . Hernia repair      x2  . Abdominal hysterectomy  2006  . Colonoscopy  2008    Subjective Symptoms/Limitations Symptoms: Lt hip pain primarily when transitioning from sit to stand.  Pertinent History: Patient has a recent history of low back pain over the last 3 to 5 months (not specified) How long can you sit comfortably?: no restriction except in a very hard chair > 1 hours How long can you walk comfortably?: eases back pain Patient Stated Goals: no pain with sitting to standing Pain Assessment Currently in Pain?: No/denies Pain Score: 5  (worst in last 7 days. when stnaidng up after prolonged sitting. ) Pain Location:  Back Pain Orientation: Left;Lower Pain Radiating Towards: none Pain Onset: More than a month ago Pain Frequency: Several days a week Pain Relieving Factors: Walking, tylenol Effect of Pain on Daily Activities: pain with mopping.   Precautions/Restrictions  Restrictions Weight Bearing Restrictions: No  Balance Screening Balance Screen Has the patient fallen in the past 6 months: No Has the patient had a decrease in activity level because of a fear of falling? : No Is the patient reluctant to leave their home because of a fear of falling? : No  Prior Functioning  Prior Function Level of Independence: Independent with basic ADLs  Cognition/Observation Cognition Overall Cognitive Status: Within Functional Limits for tasks assessed  Sensation/Coordination/Flexibility/Functional Tests Flexibility 90/90: Negative Functional Tests Functional Tests: Hip alignment: Lt hip posteriorly tilted, Rt hip anteriorly tilted and Lt hip flared out, corrected with MET.  Functional Tests: limited piriformis mobility Functional Tests: Gait: patient ambulates with generally limited hip mobility, specifically patient has limited hip extension, internal rotation and external rotation resilting in liited stride length.   Assessment LLE AROM (degrees) Left Hip Extension: 0 Left Hip Flexion: 120 Left Hip External Rotation : 35 Left Hip Internal Rotation : 15 Left Knee Extension: 0 Left Knee Flexion: 130 Left Ankle Dorsiflexion: 15 Left Ankle Plantar Flexion: 45 LLE Strength Left Hip Flexion: 4/5 Left Hip ABduction: 3+/5 Left Knee Flexion: 4/5 Left Knee Extension: 5/5 Left Ankle Dorsiflexion: 4/5 Lumbar AROM Lumbar Flexion: WNL  Lumbar Extension: 20% limited no pain Lumbar - Right Side Bend: 10% painful at endrange on Lt Lumbar - Left Side Bend: WNL Lumbar - Right Rotation: WNL Lumbar - Left Rotation: WNL  Exercise/Treatments Stretches Piriformis Stretch:  (10x 3seconds, seated, limited  ability to lower knee. )  Manual Therapy Manual Therapy: Joint mobilization Joint Mobilization: Muscle energy technique and manaul stretching to imprive hip alignment (Rt hipp inially anteriorly tilted, Lt hip Posteriorly tilted) Myofascial Release: Piriformis  Physical Therapy Assessment and Plan PT Assessment and Plan Clinical Impression Statement: Patient is a 73 year old female with 4 month history of low back/hip pain along sacroilliac joint near piriformis and PSIS. Patient's apain attribuuted to limited hip mobility and hip malalignment (Rt innominate anteriorly tilted, Lt innominate posteriorly tilted, elevated and out flared) resulting in increased strain on low back/hips during standing activities and ambulation.. Contributign factors include limited hip mobility and LE muscle weakness, see above for details. Patient demonstrated a positive response to treatment notign decreased pain follwoign piriformis release and stretching, and improved gait follwoign corection of hip alignment Pt will benefit from skilled therapeutic intervention in order to improve on the following deficits: Abnormal gait;Decreased activity tolerance;Increased muscle spasms;Decreased strength;Decreased range of motion Rehab Potential: Excellent Clinical Impairments Affecting Rehab Potential: highly motivated, limited pain PT Frequency: Min 3X/week PT Duration: 4 weeks PT Treatment/Interventions: Gait training;Stair training;Therapeutic exercise;Therapeutic activities;Balance training;Patient/family education;Manual techniques PT Plan: Initialize stretching program next session to include but not limited to hip flexors, piriformis, groin, hamstring and calfs, Continue to assess and correct hip alignment as needed with manual techniques.    Goals Home Exercise Program Pt/caregiver will Perform Home Exercise Program: For increased ROM PT Goal: Perform Home Exercise Program - Progress: Goal set today PT Short Term  Goals Time to Complete Short Term Goals: 2 weeks PT Short Term Goal 1: Patient's hip internal rotation and external rotation will improve to > 35 degrees indicatign impoved hip mobility and improved ability absorb impact during foot strike of gait PT Short Term Goal 2: Patient hip extension to improve to > 10 degrees to indicate improved stride length durign gait.  PT Long Term Goals Time to Complete Long Term Goals: 4 weeks PT Long Term Goal 1: No complain of pain >4 days with walking, sit to stand transfers and performance of house cleaning.  PT Long Term Goal 2: Patient's hip abduction to improve to 4+/5 indicating improved hip strength and improved ability to balance on one leg resulting in decrease strain on piriformis with gait.  Long Term Goal 3: Patient's hip alignment to improve to having both anterior superior iliac spine in alignement indicating improved hip positioning and decreased favoring of right LE with gait.   Problem List Patient Active Problem List   Diagnosis Date Noted  . Abnormality of gait 09/26/2013  . Weakness of left hip 09/26/2013  . Diabetes mellitus, type II 06/15/2011  . Cardiomyopathy, nonischemic   . Hypertension   . HYPOTHYROIDISM 05/28/2009  . LEFT BUNDLE BRANCH BLOCK 05/28/2009  . GASTROESOPHAGEAL REFLUX DISEASE 05/28/2009  . Hyperlipidemia 05/14/2008  . VENTRICULAR TACHYCARDIA 05/14/2008  . CEREBROVASCULAR DISEASE 05/14/2008    PT - End of Session Activity Tolerance: Patient tolerated treatment well General Behavior During Therapy: WFL for tasks assessed/performed PT Plan of Care PT Home Exercise Plan: Seated piriformis stretch PT Patient Instructions: 2x daily 10x 3sec hold Consulted and Agree with Plan of Care: Patient  GP Functional Assessment Tool Used: FOTO 28% limited changing and maintaining body  position Functional Limitation: Changing and maintaining body position Changing and Maintaining Body Position Current Status [phone]): At  least 20 percent but less than 40 percent impaired, limited or restricted Changing and Maintaining Body Position Goal Status ([id number]): At least 1 percent but less than 20 percent impaired, limited or restricted  Leia Alf PT DPT 09/26/2013, 11:41 AM  Physician Documentation Your signature is required to indicate approval of the treatment plan as stated above.  Please sign and either send electronically or make a copy of this report for your files and return this physician signed original.   Please mark one 1.__approve of plan  2. ___approve of plan with the following conditions.   ______________________________                                                          _____________________ Physician Signature                                                                                                             Date

## 2013-09-30 ENCOUNTER — Ambulatory Visit (HOSPITAL_COMMUNITY): Payer: Medicare Other | Admitting: Physical Therapy

## 2013-10-01 ENCOUNTER — Ambulatory Visit (INDEPENDENT_AMBULATORY_CARE_PROVIDER_SITE_OTHER): Payer: Medicare Other | Admitting: Cardiology

## 2013-10-01 VITALS — BP 133/47 | HR 68 | Ht 61.0 in | Wt 105.0 lb

## 2013-10-01 DIAGNOSIS — I472 Ventricular tachycardia, unspecified: Secondary | ICD-10-CM

## 2013-10-01 DIAGNOSIS — R011 Cardiac murmur, unspecified: Secondary | ICD-10-CM

## 2013-10-01 DIAGNOSIS — I428 Other cardiomyopathies: Secondary | ICD-10-CM

## 2013-10-01 DIAGNOSIS — I4729 Other ventricular tachycardia: Secondary | ICD-10-CM

## 2013-10-01 MED ORDER — CARVEDILOL 6.25 MG PO TABS: 6.2500 mg | ORAL_TABLET | Freq: Two times a day (BID) | ORAL | Status: DC

## 2013-10-01 NOTE — Progress Notes (Signed)
Clinical Summary Ms. Smotherman is a 73 y.o.female seen today for follow up of the following medical problems. This is a focused visit on her NICM and history of VT.    1. NICM  - long history of cardiomyopathy, prior caths without obstructive disease  - LVEF 20% by echo 06/2011,  - she has refused ICD previously  - denies any SOB or DOE. Walks 2 miles daily without troubles.  - no orthopnea, no PND, no LE edema  - compliant with meds, she apparently had fatigue on beta blockers sometime in the past and had not been continued on therapy. She states she thinks the doses were increased very quickly and that's why she developed symptoms   - last visit started low dose coreg which she has tolerated other than some mild occasional dizziness.      2. Ventricular tachycardia  - on amiodarone daily, she has refused a ICD in the past - 03/2013 normal LFTs, reports she has full panel coming up with Dr Luan Pulling  Past Medical History  Diagnosis Date  . Cardiomyopathy, nonischemic     Nl cors in 1989 and 1998; CHF in 12/97; EF of 40% in 5/01 and 7/03, 25% in 9/04 and 4/08.  systolic murmur without valvular abnormalities by echo; refused automatic implantable cardiac defibrillator  . Left bundle Alenah Sarria block   . Hypertension   . Hyperlipidemia   . Diabetes mellitus, type II     No insulin  . Cerebrovascular disease     Duplex study in 12/2006 shows atherosclerosis without focal stenosis  . Atrial flutter     Versus ventricular tachycardia; treated with amiodarone  . Gastroesophageal reflux disease      Allergies  Allergen Reactions  . Codeine     REACTION: rapid HB  . Decongestant [Pseudoephedrine Hcl Er]     Heart races     Current Outpatient Prescriptions  Medication Sig Dispense Refill  . amiodarone (PACERONE) 200 MG tablet TAKE ONE-HALF TABLET DAILY  30 tablet  5  . aspirin 81 MG tablet Take 81 mg by mouth daily.      . calcium gluconate 500 MG tablet Take 500 mg by mouth 2  (two) times daily.        . carvedilol (COREG) 3.125 MG tablet Take 1 tablet (3.125 mg total) by mouth 2 (two) times daily.  60 tablet  6  . digoxin (LANOXIN) 0.125 MG tablet TAKE ONE TABLET BY MOUTH EVERY DAY  30 tablet  6  . fish oil-omega-3 fatty acids 1000 MG capsule Take 1 capsule by mouth 3 (three) times daily.        . furosemide (LASIX) 40 MG tablet TAKE 1 & 1/2 TABLETS DAILY  45 tablet  11  . levothyroxine (SYNTHROID, LEVOTHROID) 75 MCG tablet Take 75 mcg by mouth daily.        Marland Kitchen losartan (COZAAR) 100 MG tablet Take 1 tablet (100 mg total) by mouth daily.  90 tablet  3  . omeprazole (PRILOSEC) 20 MG capsule Take 20 mg by mouth 2 (two) times daily.        . potassium chloride (KLOR-CON) 20 MEQ packet Take 20 mEq by mouth 2 (two) times daily.        . saxagliptin HCl (ONGLYZA) 2.5 MG TABS tablet Take 2.5 mg by mouth daily.      . simvastatin (ZOCOR) 40 MG tablet Take 40 mg by mouth at bedtime.        Marland Kitchen  spironolactone (ALDACTONE) 25 MG tablet Take 25 mg by mouth daily.         No current facility-administered medications for this visit.     Past Surgical History  Procedure Laterality Date  . Hernia repair      x2  . Abdominal hysterectomy  2006  . Colonoscopy  2008     Allergies  Allergen Reactions  . Codeine     REACTION: rapid HB  . Decongestant [Pseudoephedrine Hcl Er]     Heart races      Family History  Problem Relation Age of Onset  . Diabetes Mother   . Kidney disease Mother   . Diabetes Sister   . Heart attack Brother      Social History Ms. Hartgrove reports that she has never smoked. She does not have any smokeless tobacco history on file. Ms. Lichter reports that she does not drink alcohol.   Review of Systems CONSTITUTIONAL: No weight loss, fever, chills, weakness or fatigue.  HEENT: Eyes: No visual loss, blurred vision, double vision or yellow sclerae.No hearing loss, sneezing, congestion, runny nose or sore throat.  SKIN: No rash or itching.    CARDIOVASCULAR: per HPI RESPIRATORY: No shortness of breath, cough or sputum.  GASTROINTESTINAL: No anorexia, nausea, vomiting or diarrhea. No abdominal pain or blood.  GENITOURINARY: No burning on urination, no polyuria NEUROLOGICAL: No headache, dizziness, syncope, paralysis, ataxia, numbness or tingling in the extremities. No change in bowel or bladder control.  MUSCULOSKELETAL: No muscle, back pain, joint pain or stiffness.  LYMPHATICS: No enlarged nodes. No history of splenectomy.  PSYCHIATRIC: No history of depression or anxiety.  ENDOCRINOLOGIC: No reports of sweating, cold or heat intolerance. No polyuria or polydipsia.  Marland Kitchen   Physical Examination p 68 bp 133/47 Wt 105 lbs BMI 20 Gen: resting comfortably, no acute distress HEENT: no scleral icterus, pupils equal round and reactive, no palptable cervical adenopathy,  CV: RRR, 3/6 systolic murmur LLSB, no JVD, no carotid bruits Resp: Clear to auscultation bilaterally GI: abdomen is soft, non-tender, non-distended, normal bowel sounds, no hepatosplenomegaly MSK: extremities are warm, no edema.  Skin: warm, no rash Neuro:  no focal deficits Psych: appropriate affect   Diagnostic Studies 06/2011 Echo  LVEF 20%, mildly dilated, mild LVH, grade I diastolic dysfunction, mild to mod MR, PASP 36     Assessment and Plan  1. NICM  - NYHA II, she has refused ICD in the past, she is euvolemic in clinic today  - started low dose beta blocker last visit, tolerating with mild side effects. - will increase core got 6.25mg  bid  2. Ventricular tachycardia  - continue amiodarone, she continues to refuse ICD  - will follow up labs in June, follow thyroid and liver tests while on amio  3. Mitral regurgitation - mild to mod MR on prior echo 2 years ago - will repeat echo as surveillance  F/u 6 weeks for further medication titration    Arnoldo Lenis, M.D., F.A.C.C.

## 2013-10-01 NOTE — Patient Instructions (Addendum)
Your physician recommends that you schedule a follow-up appointment in: 6 weeks    Your physician has requested that you have an echocardiogram. Echocardiography is a painless test that uses sound waves to create images of your heart. It provides your doctor with information about the size and shape of your heart and how well your heart's chambers and valves are working. This procedure takes approximately one hour. There are no restrictions for this procedure.    Your physician has recommended you make the following change in your medication:    INCREASE Coreg to 6.25 mg twice a day    Thank you for choosing [name] !

## 2013-10-02 ENCOUNTER — Inpatient Hospital Stay (HOSPITAL_COMMUNITY): Admission: RE | Admit: 2013-10-02 | Payer: Medicare Other | Source: Ambulatory Visit

## 2013-10-03 ENCOUNTER — Other Ambulatory Visit: Payer: Self-pay | Admitting: Cardiology

## 2013-10-03 ENCOUNTER — Ambulatory Visit (HOSPITAL_COMMUNITY)
Admission: RE | Admit: 2013-10-03 | Discharge: 2013-10-03 | Disposition: A | Discharge status: Home / Self Care | Payer: Medicare Other | Source: Ambulatory Visit | Attending: Pulmonary Disease | Admitting: Pulmonary Disease

## 2013-10-03 NOTE — Progress Notes (Signed)
Physical Therapy Treatment Patient Details  Name: Donna [name] MRN: [id number] Date of Birth: [birth year]  Today's Date: 10/03/2013 Time: 3295-1884 PT Time Calculation (min): 44 min Charges: Therex x 44' (0846-0930)  Visit#: 4 of 12  Re-eval: 10/26/13    Authorization: Medicare    Authorization Visit#: 4 of 12   Subjective: Symptoms/Limitations Symptoms: Pt states that she felt better after last session. Pt only reports sinus trouble this morning. Pain Assessment Currently in Pain?: No/denies   Exercise/Treatments  Stretches Active Hamstring Stretch: 2 reps;30 seconds;Limitations Active Hamstring Stretch Limitations: Supine with rope Hip Flexor Stretch: 2 reps;30 seconds;Limitations Hip Flexor Stretch Limitations: Supine  Piriformis Stretch: 2 reps;30 seconds;Limitations Piriformis Stretch Limitations: Seated Gastroc Stretch: 2 reps;30 seconds;Limitations Gastroc Stretch Limitations: Slant board Standing Heel Raises: 10 reps;Limitations Heel Raises Limitations: toe raises x 10 reps Supine Straight Leg Raises: AROM;Both;10 reps Sidelying Hip ABduction: AROM;Both;10 reps Prone  Hip Extension: AROM;Both;1 set;10 reps Other Prone Exercises: IR/ER stretch Bil 2 reps 30 sec     Physical Therapy Assessment and Plan PT Assessment and Plan Clinical Impression Statement: Continued LE strengthening and stretching exercises per PT POC. Pt displays with tight hip rotators so prone IR/ER stretches were initiated. Pt displays ITB tightness during sidelying hip abduction exercises thus pt may benefit from  ITB stretches. Pt completed therex well after intiial multimodal cueing and demo. Pt denied pain after session on a stretched feeling in posterior thighs. The entire session was guided, instructed, and directly supervised by Roseanne Reno, PTA. PT Plan: Continue strengthening and stretching per PT POC. Begin ITB stretch next session.     Problem List Patient Active Problem List    Diagnosis Date Noted  . Abnormality of gait 09/26/2013  . Weakness of left hip 09/26/2013  . Diabetes mellitus, type II 06/15/2011  . Cardiomyopathy, nonischemic   . Hypertension   . HYPOTHYROIDISM 05/28/2009  . LEFT BUNDLE BRANCH BLOCK 05/28/2009  . GASTROESOPHAGEAL REFLUX DISEASE 05/28/2009  . Hyperlipidemia 05/14/2008  . VENTRICULAR TACHYCARDIA 05/14/2008  . CEREBROVASCULAR DISEASE 05/14/2008    PT - End of Session Activity Tolerance: Patient tolerated treatment well General Behavior During Therapy: [facility] for tasks assessed/performed   Ahmed Prima, SPTA 10/03/2013, 9:40 AM

## 2013-10-04 ENCOUNTER — Ambulatory Visit (HOSPITAL_COMMUNITY)
Admission: RE | Admit: 2013-10-04 | Discharge: 2013-10-04 | Disposition: A | Discharge status: Home / Self Care | Payer: Medicare Other | Source: Ambulatory Visit | Attending: Cardiology | Admitting: Cardiology

## 2013-10-04 DIAGNOSIS — I10 Essential (primary) hypertension: Secondary | ICD-10-CM | POA: Insufficient documentation

## 2013-10-04 DIAGNOSIS — I472 Ventricular tachycardia, unspecified: Secondary | ICD-10-CM | POA: Insufficient documentation

## 2013-10-04 DIAGNOSIS — I447 Left bundle-branch block, unspecified: Secondary | ICD-10-CM | POA: Insufficient documentation

## 2013-10-04 DIAGNOSIS — I4892 Unspecified atrial flutter: Secondary | ICD-10-CM | POA: Insufficient documentation

## 2013-10-04 DIAGNOSIS — I4729 Other ventricular tachycardia: Secondary | ICD-10-CM | POA: Insufficient documentation

## 2013-10-04 DIAGNOSIS — E119 Type 2 diabetes mellitus without complications: Secondary | ICD-10-CM | POA: Insufficient documentation

## 2013-10-04 DIAGNOSIS — R011 Cardiac murmur, unspecified: Secondary | ICD-10-CM | POA: Insufficient documentation

## 2013-10-04 DIAGNOSIS — I059 Rheumatic mitral valve disease, unspecified: Secondary | ICD-10-CM

## 2013-10-04 DIAGNOSIS — E785 Hyperlipidemia, unspecified: Secondary | ICD-10-CM | POA: Insufficient documentation

## 2013-10-04 NOTE — Progress Notes (Signed)
  Echocardiogram 2D Echocardiogram has been performed.  Donna [name] 10/04/2013, 10:31 AM

## 2013-10-07 ENCOUNTER — Ambulatory Visit (HOSPITAL_COMMUNITY)
Admission: RE | Admit: 2013-10-07 | Discharge: 2013-10-07 | Disposition: A | Discharge status: Home / Self Care | Payer: Medicare Other | Source: Ambulatory Visit | Attending: Pulmonary Disease | Admitting: Pulmonary Disease

## 2013-10-07 DIAGNOSIS — I10 Essential (primary) hypertension: Secondary | ICD-10-CM | POA: Insufficient documentation

## 2013-10-07 DIAGNOSIS — R5383 Other fatigue: Secondary | ICD-10-CM

## 2013-10-07 DIAGNOSIS — R5381 Other malaise: Secondary | ICD-10-CM | POA: Insufficient documentation

## 2013-10-07 DIAGNOSIS — M545 Low back pain, unspecified: Secondary | ICD-10-CM | POA: Insufficient documentation

## 2013-10-07 DIAGNOSIS — E119 Type 2 diabetes mellitus without complications: Secondary | ICD-10-CM | POA: Insufficient documentation

## 2013-10-07 DIAGNOSIS — IMO0001 Reserved for inherently not codable concepts without codable children: Secondary | ICD-10-CM | POA: Insufficient documentation

## 2013-10-07 DIAGNOSIS — M25559 Pain in unspecified hip: Secondary | ICD-10-CM | POA: Insufficient documentation

## 2013-10-07 DIAGNOSIS — R29898 Other symptoms and signs involving the musculoskeletal system: Secondary | ICD-10-CM

## 2013-10-07 DIAGNOSIS — R269 Unspecified abnormalities of gait and mobility: Secondary | ICD-10-CM | POA: Insufficient documentation

## 2013-10-07 NOTE — Progress Notes (Signed)
Physical Therapy Treatment Patient Details  Name: NAMITA YEARWOOD MRN: [id number] Date of Birth: [birth year]  Today's Date: 10/07/2013 Time: 0803-0845 PT Time Calculation (min): 42 min  Charges: Manual therapy: 803-815, TherEx [id number] Visit#: 5 of 12  Re-eval: 10/26/13 Assessment Diagnosis: Low back pain secondary to limited hip and piriformis mobility resulting in pain when transitioning from sitting to standing.  Next MD Visit: 12/11/13 Dr. Luan Pulling Prior Therapy: no  Authorization: Medicare  Authorization Visit#: 5 of 12   Subjective: Symptoms/Limitations Symptoms: Patient states that she feels sore today, but notes having no pain,  Pain Assessment Currently in Pain?: No/denies  Exercise/Treatments Stretches Active Hamstring Stretch: 2 reps;30 seconds;Limitations Active Hamstring Stretch Limitations: Supine with rope Hip Flexor Stretch: 2 reps;30 seconds;Limitations Hip Flexor Stretch Limitations: Supine  Piriformis Stretch: Limitations Piriformis Stretch Limitations: Seated 3 way, 10x 3sec Gastroc Stretch: 2 reps;30 seconds;Limitations Gastroc Stretch Limitations: Slant board Aerobic Stationary Bike: Nustep, lvl 1 79min to improve soreness/ROM Standing Heel Raises: 10 reps;Limitations Heel Raises Limitations: toe raises x 10 reps Supine Straight Leg Raises: AROM;Both;10 reps Sidelying Clams 10x  Manual Therapy Manual Therapy: Joint mobilization Joint Mobilization: Piriformis soft tissue mobilization, Hip IR/ER grade 1-2 mobilization   Physical Therapy Assessment and Plan PT Assessment and Plan Clinical Impression Statement: Patient displays improving symptoms htough still limited hip mobility with piriformis muscle particularly limited. Following stretching and hip strengthenign exercises patient demosntrated decreased soreness/pain and improved gait.  Pt will benefit from skilled therapeutic intervention in order to improve on the following deficits: Abnormal  gait;Decreased activity tolerance;Increased muscle spasms;Decreased strength;Decreased range of motion PT Treatment/Interventions: Gait training;Stair training;Therapeutic exercise;Therapeutic activities;Balance training;Patient/family education;Manual techniques PT Plan: Continue strengthening and stretching per PT POC. Begin ITB stretch next session.    Goals PT Short Term Goals PT Short Term Goal 1: Patient's hip internal rotation and external rotation will improve to > 35 degrees indicatign impoved hip mobility and improved ability absorb impact during foot strike of gait PT Short Term Goal 1 - Progress: Progressing toward goal PT Short Term Goal 2: Patient hip extension to improve to > 10 degrees to indicate improved stride length durign gait.  PT Short Term Goal 2 - Progress: Progressing toward goal PT Long Term Goals PT Long Term Goal 1: No complain of pain >4 days with walking, sit to stand transfers and performance of house cleaning.  PT Long Term Goal 1 - Progress: Progressing toward goal PT Long Term Goal 2: Patient's hip abduction to improve to 4+/5 indicating improved hip strength and improved ability to balance on one leg resulting in decrease strain on piriformis with gait.  PT Long Term Goal 2 - Progress: Progressing toward goal Long Term Goal 3: Patient's hip alignment to improve to having both anterior superior iliac spine in alignement indicating improved hip positioning and decreased favoring of right LE with gait.  Long Term Goal 3 Progress: Progressing toward goal  Problem List Patient Active Problem List   Diagnosis Date Noted  . Abnormality of gait 09/26/2013  . Weakness of left hip 09/26/2013  . Diabetes mellitus, type II 06/15/2011  . Cardiomyopathy, nonischemic   . Hypertension   . HYPOTHYROIDISM 05/28/2009  . LEFT BUNDLE BRANCH BLOCK 05/28/2009  . GASTROESOPHAGEAL REFLUX DISEASE 05/28/2009  . Hyperlipidemia 05/14/2008  . VENTRICULAR TACHYCARDIA 05/14/2008  .  CEREBROVASCULAR DISEASE 05/14/2008    PT - End of Session Activity Tolerance: Patient tolerated treatment well General Behavior During Therapy: WFL for tasks assessed/performed PT Plan of Care PT Home Exercise  Plan: Seated piriformis stretch PT Patient Instructions: 2x daily 10x 3sec hold Consulted and Agree with Plan of Care: Patient  GP    Suzette Battiest [place]ilding Quintanar 10/07/2013, 8:29 AM

## 2013-10-09 ENCOUNTER — Ambulatory Visit (HOSPITAL_COMMUNITY)
Admission: RE | Admit: 2013-10-09 | Discharge: 2013-10-09 | Disposition: A | Discharge status: Home / Self Care | Payer: Medicare Other | Source: Ambulatory Visit | Attending: Pulmonary Disease | Admitting: Pulmonary Disease

## 2013-10-09 DIAGNOSIS — R29898 Other symptoms and signs involving the musculoskeletal system: Secondary | ICD-10-CM

## 2013-10-09 NOTE — Progress Notes (Signed)
Physical Therapy Treatment Patient Details  Name: Donna [name] MRN: [id number] Date of Birth: February 29, 1941  Today's Date: 10/09/2013 Time: 5400-8676 PT Time Calculation (min): 44 min Charge:   TE [id number]  Visit#: 4 of 12  Re-eval: 10/26/13 Assessment Diagnosis: Low back pain secondary to limited hip and piriformis mobility resulting in pain when transitioning from sitting to standing.  Next MD Visit: 12/11/13 Dr. Luan Pulling Prior Therapy: no  Authorization: Medicare  Authorization Time Period:    Authorization Visit#: 4 of 12   Subjective: Symptoms/Limitations Symptoms: Pain free this morning, compliant with HEP 2x daily. Pain Assessment Currently in Pain?: No/denies  Objective:   Exercise/Treatments Stretches Active Hamstring Stretch: 2 reps;30 seconds;Limitations Active Hamstring Stretch Limitations: Supine with rope Hip Flexor Stretch: 2 reps;30 seconds;Limitations Hip Flexor Stretch Limitations: Supine  ITB Stretch: 2 reps;30 seconds Piriformis Stretch: 3 reps;30 seconds;Limitations Piriformis Stretch Limitations: supine 4way Gastroc Stretch: 2 reps;30 seconds;Limitations Gastroc Stretch Limitations: Slant board Aerobic Stationary Bike: Nustep, hill level 3, resistance 3 29min to improve soreness/ROM Standing Heel Raises: 10 reps;Limitations Heel Raises Limitations: toe raises x 10 reps Other Standing Knee Exercises: 3D hip excursion 10x Supine Straight Leg Raises: AROM;Both;10 reps Sidelying Clams: 10x 10"  Manual Therapy Manual Therapy: Joint mobilization Joint Mobilization: Piriformis soft tissue mobilization, Hip IR/ER grade 1-2 mobilization  Physical Therapy Assessment and Plan PT Assessment and Plan Clinical Impression Statement: Began 3D hip excursion to iimprove hip mobility.  Pt limited IR primarily due to tight piriformis.  Ended session with manual soft tissue mobilization grade I-II and stretches for piriformis, hamstrins, IT band and gastroc to  improve flexibilty Bil.  No reports of pain through session, noted improved gait mechanics. PT Plan: Continue strengthening and stretching per PT POC.     Goals PT Short Term Goals PT Short Term Goal 1: Patient's hip internal rotation and external rotation will improve to > 35 degrees indicatign impoved hip mobility and improved ability absorb impact during foot strike of gait PT Short Term Goal 1 - Progress: Progressing toward goal PT Short Term Goal 2: Patient hip extension to improve to > 10 degrees to indicate improved stride length durign gait.  PT Short Term Goal 2 - Progress: Progressing toward goal PT Long Term Goals PT Long Term Goal 1: No complain of pain >4 days with walking, sit to stand transfers and performance of house cleaning.  PT Long Term Goal 1 - Progress: Progressing toward goal PT Long Term Goal 2: Patient's hip abduction to improve to 4+/5 indicating improved hip strength and improved ability to balance on one leg resulting in decrease strain on piriformis with gait.  PT Long Term Goal 2 - Progress: Progressing toward goal Long Term Goal 3: Patient's hip alignment to improve to having both anterior superior iliac spine in alignement indicating improved hip positioning and decreased favoring of right LE with gait.   Problem List Patient Active Problem List   Diagnosis Date Noted  . Abnormality of gait 09/26/2013  . Weakness of left hip 09/26/2013  . Diabetes mellitus, type II 06/15/2011  . Cardiomyopathy, nonischemic   . Hypertension   . HYPOTHYROIDISM 05/28/2009  . LEFT BUNDLE BRANCH BLOCK 05/28/2009  . GASTROESOPHAGEAL REFLUX DISEASE 05/28/2009  . Hyperlipidemia 05/14/2008  . VENTRICULAR TACHYCARDIA 05/14/2008  . CEREBROVASCULAR DISEASE 05/14/2008    PT - End of Session Activity Tolerance: Patient tolerated treatment well General Behavior During Therapy: [facility] for tasks assessed/performed  GP    Aldona Lento 10/09/2013, 8:53 AM

## 2013-10-11 ENCOUNTER — Ambulatory Visit (HOSPITAL_COMMUNITY)
Admission: RE | Admit: 2013-10-11 | Discharge: 2013-10-11 | Disposition: A | Discharge status: Home / Self Care | Payer: Medicare Other | Source: Ambulatory Visit | Attending: Pulmonary Disease | Admitting: Pulmonary Disease

## 2013-10-11 DIAGNOSIS — R29898 Other symptoms and signs involving the musculoskeletal system: Secondary | ICD-10-CM

## 2013-10-11 NOTE — Progress Notes (Signed)
Physical Therapy Treatment Patient Details  Name: Donna [name] MRN: [id number] Date of Birth: Dec 17, 1940  Today's Date: 10/11/2013 Time: 3299-2426 PT Time Calculation (min): 42 min Charge: TE [id number]  Visit#: 5 of 12  Re-eval: 10/26/13 Assessment Diagnosis: Low back pain secondary to limited hip and piriformis mobility resulting in pain when transitioning from sitting to standing.  Next MD Visit: 12/11/13 Dr. Luan Pulling Prior Therapy: no  Authorization: Medicare  Authorization Time Period:    Authorization Visit#: 5 of 12   Subjective: Symptoms/Limitations Symptoms: Pt stated she was a little sore following last session.  Pain Assessment Currently in Pain?: Yes Pain Score: 3  Pain Location: Buttocks Pain Orientation: Left  Objective:   Exercise/Treatments Stretches Active Hamstring Stretch: 2 reps;30 seconds;Limitations Active Hamstring Stretch Limitations: Supine with rope ITB Stretch: 2 reps;30 seconds Piriformis Stretch: 3 reps;30 seconds;Limitations Piriformis Stretch Limitations: supine 4way Standing Heel Raises: 10 reps;Limitations Heel Raises Limitations: toe raises x 10 reps Other Standing Knee Exercises: 3D hip excursion 10x Other Standing Knee Exercises: high march and hold 5" 10 reps; hip abduction and extensin with tactile cueing for posture 10x each LE Sidelying Hip ABduction: AROM;Both;10 reps Prone  Hip Extension: AROM;Both;10 reps Other Prone Exercises: IR/ER stretch Bil 2 reps 30 sec      Physical Therapy Assessment and Plan PT Assessment and Plan Clinical Impression Statement: Continued hp excursion exercises to improve hip mobilty.  Progressed to standing exercises to improve gluteal medus and maximus strengtheing with tactile cueing for posture and multiple vc-ing to reduce ER.  Pt cotninues to be limited IT due to tight piriformis.  Continued stretches for hamstrings, piriformis and IT band to improve flexibilty. PT Plan: Continue  strengthening and stretching per PT POC.     Goals PT Short Term Goals PT Short Term Goal 1: Patient's hip internal rotation and external rotation will improve to > 35 degrees indicatign impoved hip mobility and improved ability absorb impact during foot strike of gait PT Short Term Goal 1 - Progress: Progressing toward goal PT Short Term Goal 2: Patient hip extension to improve to > 10 degrees to indicate improved stride length durign gait.  PT Short Term Goal 2 - Progress: Progressing toward goal PT Long Term Goals PT Long Term Goal 1: No complain of pain >4 days with walking, sit to stand transfers and performance of house cleaning.  PT Long Term Goal 2: Patient's hip abduction to improve to 4+/5 indicating improved hip strength and improved ability to balance on one leg resulting in decrease strain on piriformis with gait.  PT Long Term Goal 2 - Progress: Progressing toward goal Long Term Goal 3: Patient's hip alignment to improve to having both anterior superior iliac spine in alignement indicating improved hip positioning and decreased favoring of right LE with gait.   Problem List Patient Active Problem List   Diagnosis Date Noted  . Abnormality of gait 09/26/2013  . Weakness of left hip 09/26/2013  . Diabetes mellitus, type II 06/15/2011  . Cardiomyopathy, nonischemic   . Hypertension   . HYPOTHYROIDISM 05/28/2009  . LEFT BUNDLE BRANCH BLOCK 05/28/2009  . GASTROESOPHAGEAL REFLUX DISEASE 05/28/2009  . Hyperlipidemia 05/14/2008  . VENTRICULAR TACHYCARDIA 05/14/2008  . CEREBROVASCULAR DISEASE 05/14/2008    PT - End of Session Activity Tolerance: Patient tolerated treatment well General Behavior During Therapy: [facility] for tasks assessed/performed  GP    Aldona Lento 10/11/2013, 1:49 PM

## 2013-10-14 ENCOUNTER — Ambulatory Visit (HOSPITAL_COMMUNITY)
Admission: RE | Admit: 2013-10-14 | Discharge: 2013-10-14 | Disposition: A | Discharge status: Home / Self Care | Payer: Medicare Other | Source: Ambulatory Visit | Attending: Pulmonary Disease | Admitting: Pulmonary Disease

## 2013-10-14 NOTE — Progress Notes (Signed)
Physical Therapy Treatment Patient Details  Name: Donna [name] MRN: [id number] Date of Birth: 1940-11-29  Today's Date: 10/14/2013 Time: 0800-0845 PT Time Calculation (min): 45 min  Visit#: 6 of 12  Re-eval: 10/26/13 Authorization: Medicare  Authorization Visit#: 6 of 12  Charges: therex 42  Subjective: Symptoms/Limitations Symptoms: PT states she has not had any "pain" in a while.  states she is still sore in her back and stiff in the mornings but overall improved.  Pain Assessment Currently in Pain?: No/denies   Exercise/Treatments Aerobic Stationary Bike: Nustep, hill level 3, resistance 3 38min to improve soreness/ROM Standing Heel Raises: 10 reps;Limitations Heel Raises Limitations: toe raises x 10 reps Other Standing Knee Exercises: 3D hip excursion 10x Other Standing Knee Exercises: high march and hold 5" 10 reps; hip abduction and extensin with tactile cueing for posture 10x each LE Supine Bridges: 10 reps Straight Leg Raises: AROM;Both;10 reps Sidelying Hip ABduction: AROM;Both;10 reps Clams: 10x 10" Prone  Hip Extension: AROM;Both;10 reps Other Prone Exercises: IR/ER stretch Bil 2 reps 30 sec      Physical Therapy Assessment and Plan PT Assessment and Plan Clinical Impression Statement: continued with current POC with overall improving mobility and strength.  Pt needs therapist facilitation to utilize correct musculature with sidelying abduction exercise.  Pt without complaints and able to complete all exercises    PT Plan: Continue strengthening and stretching per PT POC.      Problem List Patient Active Problem List   Diagnosis Date Noted  . Abnormality of gait 09/26/2013  . Weakness of left hip 09/26/2013  . Diabetes mellitus, type II 06/15/2011  . Cardiomyopathy, nonischemic   . Hypertension   . HYPOTHYROIDISM 05/28/2009  . LEFT BUNDLE BRANCH BLOCK 05/28/2009  . GASTROESOPHAGEAL REFLUX DISEASE 05/28/2009  . Hyperlipidemia 05/14/2008  .  VENTRICULAR TACHYCARDIA 05/14/2008  . CEREBROVASCULAR DISEASE 05/14/2008    PT - End of Session Activity Tolerance: Patient tolerated treatment well General Behavior During Therapy: WFL for tasks assessed/performed   Teena Irani, PTA/CLT 10/14/2013, 11:12 AM

## 2013-10-16 ENCOUNTER — Ambulatory Visit (HOSPITAL_COMMUNITY)
Admission: RE | Admit: 2013-10-16 | Discharge: 2013-10-16 | Disposition: A | Discharge status: Home / Self Care | Payer: Medicare Other | Source: Ambulatory Visit | Attending: Pulmonary Disease | Admitting: Pulmonary Disease

## 2013-10-16 DIAGNOSIS — R29898 Other symptoms and signs involving the musculoskeletal system: Secondary | ICD-10-CM

## 2013-10-16 NOTE — Progress Notes (Signed)
Physical Therapy Treatment Patient Details  Name: Donna [name] MRN: [id number] Date of Birth: 07/14/1940  Today's Date: 10/16/2013 Time: 0802-0846 PT Time Calculation (min): 44 min Charge: TE [id number]  Visit#: 7 of 12  Re-eval: 10/26/13 Assessment Diagnosis: Low back pain secondary to limited hip and piriformis mobility resulting in pain when transitioning from sitting to standing.  Next MD Visit: 12/11/13 Dr. Luan Pulling Prior Therapy: no  Authorization: Medicare  Authorization Time Period:    Authorization Visit#: 7 of 12   Subjective: Symptoms/Limitations Symptoms: Pt stated she was stiff this morning, no c/o pain. Pain Assessment Currently in Pain?: No/denies  Objective:   Exercise/Treatments Aerobic Stationary Bike: Nustep, hill level 3, resistance 3 10 min to improve soreness/ROM/activtiy tolerance SPM 93 Standing Heel Raises: Limitations Heel Raises Limitations: heel and toe walking 1RT Forward Lunges: 10 reps;Limitations Forward Lunges Limitations: Bil LE on 8 in step Functional Squat: 15 reps;Limitations Functional Squat Limitations: FST squats with tactile cueing on shoulder to reduce lumbar flexion with squat Other Standing Knee Exercises: 3D hip excursion 10x Other Standing Knee Exercises: sidestepping with green tband and  Seated Other Seated Knee Exercises: heel and toe roll out 10x 10" Other Seated Knee Exercises:   Supine Bridges: 15 reps Sidelying Hip ABduction: AROM;Both;15 reps Clams: 10x 10"     Physical Therapy Assessment and Plan PT Assessment and Plan Clinical Impression Statement: Progressed standing exercises for hip mobilty and gluteal strengthening this session.  Pt with multimodal facilitation to utilize correct musculature with sidestepping and sidelying abduction exercises and tactile cueing for from wtih squats.  No c/o of pain through session. PT Plan: Continue strengthening and stretching per PT POC.     Goals PT Short Term  Goals PT Short Term Goal 1: Patient's hip internal rotation and external rotation will improve to > 35 degrees indicatign impoved hip mobility and improved ability absorb impact during foot strike of gait PT Short Term Goal 1 - Progress: Progressing toward goal PT Short Term Goal 2: Patient hip extension to improve to > 10 degrees to indicate improved stride length durign gait.  PT Short Term Goal 2 - Progress: Progressing toward goal PT Long Term Goals PT Long Term Goal 1: No complain of pain >4 days with walking, sit to stand transfers and performance of house cleaning.  PT Long Term Goal 1 - Progress: Progressing toward goal PT Long Term Goal 2: Patient's hip abduction to improve to 4+/5 indicating improved hip strength and improved ability to balance on one leg resulting in decrease strain on piriformis with gait.  Long Term Goal 3: Patient's hip alignment to improve to having both anterior superior iliac spine in alignement indicating improved hip positioning and decreased favoring of right LE with gait.   Problem List Patient Active Problem List   Diagnosis Date Noted  . Abnormality of gait 09/26/2013  . Weakness of left hip 09/26/2013  . Diabetes mellitus, type II 06/15/2011  . Cardiomyopathy, nonischemic   . Hypertension   . HYPOTHYROIDISM 05/28/2009  . LEFT BUNDLE BRANCH BLOCK 05/28/2009  . GASTROESOPHAGEAL REFLUX DISEASE 05/28/2009  . Hyperlipidemia 05/14/2008  . VENTRICULAR TACHYCARDIA 05/14/2008  . CEREBROVASCULAR DISEASE 05/14/2008    PT - End of Session Activity Tolerance: Patient tolerated treatment well General Behavior During Therapy: [facility] for tasks assessed/performed  GP    Aldona Lento 10/16/2013, 8:53 AM

## 2013-10-18 ENCOUNTER — Ambulatory Visit (HOSPITAL_COMMUNITY)
Admission: RE | Admit: 2013-10-18 | Discharge: 2013-10-18 | Disposition: A | Discharge status: Home / Self Care | Payer: Medicare Other | Source: Ambulatory Visit | Attending: Pulmonary Disease | Admitting: Pulmonary Disease

## 2013-10-18 DIAGNOSIS — R29898 Other symptoms and signs involving the musculoskeletal system: Secondary | ICD-10-CM

## 2013-10-18 NOTE — Progress Notes (Addendum)
Physical Therapy Treatment Patient Details  Name: Donna [name] MRN: [id number] Date of Birth: [birth year]  Today's Date: 10/18/2013 Time: 0930-1015 PT Time Calculation (min): 45 min   Charges: [phone] TherEx, 1005-1015 Manual therapy.  Visit#: 8 of 12  Re-eval: 10/26/13    Authorization: Medicare  Authorization Time Period:    Authorization Visit#: 8 of 12   Subjective: Symptoms/Limitations Symptoms: Patient states that she is feeling a little sore today Pain Assessment Currently in Pain?: No/denies  Exercise/Treatments Stretches Piriformis Stretch Limitations: seated 10x 3sec Gastroc Stretch: 3 reps;20 seconds Gastroc Stretch Limitations: Slant board Aerobic Stationary Bike: Nustep, hill level 3, resistance 3 10 min to improve soreness/ROM/activtiy tolerance SPM 93 Standing Side Lunges: 10 reps;Limitations Side Lunges Limitations: 2 sets, 1st set to 8" step, second set to floor Other Standing Knee Exercises: 3D hip excursion 10x  Manual Therapy Myofascial Release: piriformis  Physical Therapy Assessment and Plan PT Assessment and Plan Clinical Impression Statement: Patient is progressing well, Patient demonstrated a good response this session to glut med/max strengthening via stanidng exercises and not3d no pain throughout session only that she could feel the muscles working. Patient demosntrated improved gait with improved hip sway throughout gait following hip excursions and especially following lateral lunges,  PT Plan: Continue glut med/max and hamstring strengthening and piriformis, calf and hip flexor stretching and progress exercises as tolerated. utilize manual techniques as needed.     Goals    Problem List Patient Active Problem List   Diagnosis Date Noted  . Abnormality of gait 09/26/2013  . Weakness of left hip 09/26/2013  . Diabetes mellitus, type II 06/15/2011  . Cardiomyopathy, nonischemic   . Hypertension   . HYPOTHYROIDISM 05/28/2009  . LEFT  BUNDLE BRANCH BLOCK 05/28/2009  . GASTROESOPHAGEAL REFLUX DISEASE 05/28/2009  . Hyperlipidemia 05/14/2008  . VENTRICULAR TACHYCARDIA 05/14/2008  . CEREBROVASCULAR DISEASE 05/14/2008    PT - End of Session Activity Tolerance: Patient tolerated treatment well General Behavior During Therapy: WFL for tasks assessed/performed PT Plan of Care PT Home Exercise Plan: Seated piriformis stretch, 3D hip excursion PT Patient Instructions: 2x daily 10x 3sec hold Consulted and Agree with Plan of Care: Patient  GP    Suzette Battiest [place] Welliver 10/18/2013, 10:23 AM

## 2013-10-21 ENCOUNTER — Ambulatory Visit (HOSPITAL_COMMUNITY)
Admission: RE | Admit: 2013-10-21 | Discharge: 2013-10-21 | Disposition: A | Discharge status: Home / Self Care | Payer: Medicare Other | Source: Ambulatory Visit | Attending: Pulmonary Disease | Admitting: Pulmonary Disease

## 2013-10-21 DIAGNOSIS — R29898 Other symptoms and signs involving the musculoskeletal system: Secondary | ICD-10-CM

## 2013-10-21 NOTE — Progress Notes (Addendum)
Physical Therapy Treatment Patient Details  Name: Donna [name] MRN: [id number] Date of Birth: 03-17-1941  Today's Date: 10/21/2013 Time: 0810-0848 PT Time Calculation (min): 38 min Charge: TE [id number]  Visit#: 9 of 12  Re-eval: 10/26/13 Assessment Diagnosis: Low back pain secondary to limited hip and piriformis mobility resulting in pain when transitioning from sitting to standing.  Next MD Visit: 12/11/13 Dr. Luan Pulling Prior Therapy: no  Authorization: Medicare  Authorization Time Period:    Authorization Visit#: 9 of 12   Subjective: Symptoms/Limitations Symptoms: Pain free c/o allergy and sinus issues today.   Pain Assessment Currently in Pain?: No/denies  Objective:   Exercise/Treatments Stretches Active Hamstring Stretch: Limitations Active Hamstring Stretch Limitations: 10x 3" holds on 14 in box Piriformis Stretch: 3 reps;30 seconds;Limitations Piriformis Stretch Limitations: supine 4way with towel Gastroc Stretch: 3 reps;20 seconds Gastroc Stretch Limitations: Slant board Standing Forward Lunges: 10 reps;Limitations Forward Lunges Limitations: Bil LE on 8 in step Side Lunges: 10 reps;Limitations Side Lunges Limitations: 10 reps Bil LE on floor Other Standing Knee Exercises: 3D hip excursion 10x Other Standing Knee Exercises: hip abduction against wall 2sets 10 reps; hip hike 10x Bil LE Sidelying Clams: 10x 10" with ball between heels to reduce compensation     Physical Therapy Assessment and Plan PT Assessment and Plan Clinical Impression Statement: Began standing abduction against wall and sidelying clam exercises with ball between hills to reduce compensation with TFL for glut med strengtheing   Pt with increased difficutly with new technique due to weak musculature.  No c/o pain through session.  Improved gait mechanics noted at end of session . PT Plan: Gcode due next session.  Continue glut med/max and hamstring strengthening and piriformis, calf and hip  flexor stretching and progress exercises as tolerated. utilize manual techniques as needed.     Goals PT Short Term Goals PT Short Term Goal 1: Patient's hip internal rotation and external rotation will improve to > 35 degrees indicatign impoved hip mobility and improved ability absorb impact during foot strike of gait PT Short Term Goal 1 - Progress: Progressing toward goal PT Short Term Goal 2: Patient hip extension to improve to > 10 degrees to indicate improved stride length durign gait.  PT Long Term Goals PT Long Term Goal 1: No complain of pain >4 days with walking, sit to stand transfers and performance of house cleaning.  PT Long Term Goal 1 - Progress: Progressing toward goal PT Long Term Goal 2: Patient's hip abduction to improve to 4+/5 indicating improved hip strength and improved ability to balance on one leg resulting in decrease strain on piriformis with gait.  PT Long Term Goal 2 - Progress: Progressing toward goal Long Term Goal 3: Patient's hip alignment to improve to having both anterior superior iliac spine in alignement indicating improved hip positioning and decreased favoring of right LE with gait.   Problem List Patient Active Problem List   Diagnosis Date Noted  . Abnormality of gait 09/26/2013  . Weakness of left hip 09/26/2013  . Diabetes mellitus, type II 06/15/2011  . Cardiomyopathy, nonischemic   . Hypertension   . HYPOTHYROIDISM 05/28/2009  . LEFT BUNDLE BRANCH BLOCK 05/28/2009  . GASTROESOPHAGEAL REFLUX DISEASE 05/28/2009  . Hyperlipidemia 05/14/2008  . VENTRICULAR TACHYCARDIA 05/14/2008  . CEREBROVASCULAR DISEASE 05/14/2008    PT - End of Session Activity Tolerance: Patient tolerated treatment well General Behavior During Therapy: [facility] for tasks assessed/performed  GP    Aldona Lento 10/21/2013, 9:12 AM

## 2013-10-23 ENCOUNTER — Ambulatory Visit (HOSPITAL_COMMUNITY)
Admission: RE | Admit: 2013-10-23 | Discharge: 2013-10-23 | Disposition: A | Discharge status: Home / Self Care | Payer: Medicare Other | Source: Ambulatory Visit | Attending: Pulmonary Disease | Admitting: Pulmonary Disease

## 2013-10-23 DIAGNOSIS — R29898 Other symptoms and signs involving the musculoskeletal system: Secondary | ICD-10-CM

## 2013-10-23 NOTE — Evaluation (Signed)
Physical Therapy Re-Assessment  Patient Details  Name: Donna [name] MRN: [id number] Date of Birth: 07/10/1940  Today's Date: 10/23/2013 Time: 0800-0845 PT Time Calculation (min): 45 min     Charges: TherEx 800-830, manual [phone]         Visit#: 10 of 12  Re-eval: 10/26/13 Assessment Diagnosis: Low back pain secondary to limited hip and piriformis mobility resulting in pain when transitioning from sitting to standing.  Next MD Visit: 12/11/13 Dr. Luan Pulling Prior Therapy: no  Authorization: Medicare    Authorization Time Period:    Authorization Visit#: 10 of 12   Past Medical History:  Past Medical History  Diagnosis Date  . Cardiomyopathy, nonischemic     Nl cors in 1989 and 1998; CHF in 12/97; EF of 40% in 5/01 and 7/03, 25% in 9/04 and 4/08.  systolic murmur without valvular abnormalities by echo; refused automatic implantable cardiac defibrillator  . Left bundle branch block   . Hypertension   . Hyperlipidemia   . Diabetes mellitus, type II     No insulin  . Cerebrovascular disease     Duplex study in 12/2006 shows atherosclerosis without focal stenosis  . Atrial flutter     Versus ventricular tachycardia; treated with amiodarone  . Gastroesophageal reflux disease    Past Surgical History:  Past Surgical History  Procedure Laterality Date  . Hernia repair      x2  . Abdominal hysterectomy  2006  . Colonoscopy  2008    Subjective Symptoms/Limitations Symptoms: Patient states she is painfree today, just sore in her hips.  Pertinent History: Patient has a recent history of low back pain over the last 3 to 5 months (not specified)  Sensation/Coordination/Flexibility/Functional Tests Functional Tests Functional Tests: Hip alignment WNL  Assessment LLE AROM (degrees) Left Hip External Rotation : 32 Left Hip Internal Rotation : 31 LLE Strength Left Hip Flexion: 4/5 Left Hip ABduction: 3+/5 Left Knee Flexion:  (4+/5) Left Knee Extension: 5/5 Left Ankle  Dorsiflexion:  (4+/5) Lumbar AROM Overall Lumbar AROM Comments: WNL  Exercise/Treatments Stretches Piriformis Stretch: 3 reps;30 seconds;Limitations Piriformis Stretch Limitations: supine 4way with towel Gastroc Stretch: 3 reps;20 seconds Gastroc Stretch Limitations: Slant board Aerobic Nustep 58mn lvl 4 Standing Forward Lunges: 10 reps;Limitations Forward Lunges Limitations: Bil LE on 8 in step Side Lunges Limitations: 10 reps Bil LE on floor  Manual Therapy: strain counter strain piriformis, manual stretch of piriformis, and myofascial release of piriformis   Physical Therapy Assessment and Plan PT Assessment and Plan Clinical Impression Statement: Patient displays great progress towards akll goals, no longer having pain or difficulty with any activities. patien continues to have limited glute strength which is a strong contributing factor to her continuing soreness in her hips for which she would benefit from education on athe progression of strengthening exercises to  iprove strength.  Pt will benefit from skilled therapeutic intervention in order to improve on the following deficits: Abnormal gait;Decreased activity tolerance;Increased muscle spasms;Decreased strength;Decreased range of motion Rehab Potential: Excellent Clinical Impairments Affecting Rehab Potential: highly motivated, limited pain PT Treatment/Interventions: Gait training;Stair training;Therapeutic exercise;Therapeutic activities;Balance training;Patient/family education;Manual techniques PT Plan: Patient to be discharged next session following education on HEP for patient to contineud exercises at home as patient notes she contineus to ave difficulty remembering some of her exercises    Goals PT Short Term Goals PT Short Term Goal 1: Patient's hip internal rotation and external rotation will improve to > 35 degrees indicatign impoved hip mobility and improved ability absorb  impact during foot strike of gait PT  Short Term Goal 1 - Progress: Met PT Short Term Goal 2: Patient hip extension to improve to > 10 degrees to indicate improved stride length durign gait.  PT Short Term Goal 2 - Progress: Met PT Long Term Goals PT Long Term Goal 1: No complain of pain >4 days with walking, sit to stand transfers and performance of house cleaning.  PT Long Term Goal 1 - Progress: Met PT Long Term Goal 2 - Progress: Progressing toward goal Long Term Goal 3: Patient's hip alignment to improve to having both anterior superior iliac spine in alignement indicating improved hip positioning and decreased favoring of right LE with gait.  Long Term Goal 3 Progress: Met  Problem List Patient Active Problem List   Diagnosis Date Noted  . Abnormality of gait 09/26/2013  . Weakness of left hip 09/26/2013  . Diabetes mellitus, type II 06/15/2011  . Cardiomyopathy, nonischemic   . Hypertension   . HYPOTHYROIDISM 05/28/2009  . LEFT BUNDLE BRANCH BLOCK 05/28/2009  . GASTROESOPHAGEAL REFLUX DISEASE 05/28/2009  . Hyperlipidemia 05/14/2008  . VENTRICULAR TACHYCARDIA 05/14/2008  . CEREBROVASCULAR DISEASE 05/14/2008    PT - End of Session Activity Tolerance: Patient tolerated treatment well General Behavior During Therapy: WFL for tasks assessed/performed PT Plan of Care PT Home Exercise Plan: Seated piriformis stretch, 3D hip excursion, lateral lunges PT Patient Instructions: 2x daily 10x 3sec hold Consulted and Agree with Plan of Care: Patient  GP Functional Assessment Tool Used: FOTO 11%l limited, was 28% limited changing and maintaining body position Functional Limitation: Changing and maintaining body position Changing and Maintaining Body Position Current Status (V2919): At least 1 percent but less than 20 percent impaired, limited or restricted Changing and Maintaining Body Position Goal Status (T6606): At least 1 percent but less than 20 percent impaired, limited or restricted  Leia Alf PT  DPT 10/23/2013, 9:42 AM  Physician Documentation Your signature is required to indicate approval of the treatment plan as stated above.  Please sign and either send electronically or make a copy of this report for your files and return this physician signed original.   Please mark one 1.__approve of plan  2. ___approve of plan with the following conditions.   ______________________________                                                          _____________________ Physician Signature                                                                                                             Date

## 2013-10-25 ENCOUNTER — Ambulatory Visit (HOSPITAL_COMMUNITY)
Admission: RE | Admit: 2013-10-25 | Discharge: 2013-10-25 | Disposition: A | Discharge status: Home / Self Care | Payer: Medicare Other | Source: Ambulatory Visit | Attending: Pulmonary Disease | Admitting: Pulmonary Disease

## 2013-10-25 DIAGNOSIS — R29898 Other symptoms and signs involving the musculoskeletal system: Secondary | ICD-10-CM

## 2013-10-25 NOTE — Evaluation (Signed)
Physical Therapy Discharge  Patient Details  Name: Donna [name] MRN: [id number] Date of Birth: 1941-01-20  Today's Date: 10/25/2013 Time: 9935-7017 PT Time Calculation (min): 45 min     TherEx [id number]         Visit#: 11 of 12  Re-eval: 11/29/13 Assessment Diagnosis: Low back pain secondary to limited hip and piriformis mobility resulting in pain when transitioning from sitting to standing.  Next MD Visit: 12/11/13 Dr. Luan Pulling Prior Therapy: no  Authorization: Medicare    Authorization Visit#: 46 of 12   Past Medical History:  Past Medical History  Diagnosis Date  . Cardiomyopathy, nonischemic     Nl cors in 1989 and 1998; CHF in 12/97; EF of 40% in 5/01 and 7/03, 25% in 9/04 and 4/08.  systolic murmur without valvular abnormalities by echo; refused automatic implantable cardiac defibrillator  . Left bundle branch block   . Hypertension   . Hyperlipidemia   . Diabetes mellitus, type II     No insulin  . Cerebrovascular disease     Duplex study in 12/2006 shows atherosclerosis without focal stenosis  . Atrial flutter     Versus ventricular tachycardia; treated with amiodarone  . Gastroesophageal reflux disease    Past Surgical History:  Past Surgical History  Procedure Laterality Date  . Hernia repair      x2  . Abdominal hysterectomy  2006  . Colonoscopy  2008    Subjective Symptoms/Limitations Symptoms: Patient states she is painfree today,with only minor soreness "I have been feeling excellent." Pain Assessment Currently in Pain?: No/denies  Sensation/Coordination/Flexibility/Functional Tests Functional Tests Functional Tests: Hip alignment WNL  Assessment LLE AROM (degrees) Left Hip External Rotation : 32 Left Hip Internal Rotation : 31 LLE Strength Left Hip Flexion: 4/5 Left Hip ABduction: 4/5 Left Knee Flexion:  (4+/5) Left Knee Extension: 5/5 Left Ankle Dorsiflexion:  (4+/5) Lumbar AROM Overall Lumbar AROM Comments:  WNL  Exercise/Treatments Stretches Active Hamstring Stretch: Limitations Active Hamstring Stretch Limitations: 10x 3" holds on 14 in box Piriformis Stretch: Limitations Piriformis Stretch Limitations: supine 4-way with towel and seated 3way 10x 3sec hold Gastroc Stretch: 3 reps;20 seconds Gastroc Stretch Limitations: Slant board Standing Forward Lunges: 10 reps;Limitations Forward Lunges Limitations: Bil LE on 8 in step Side Lunges Limitations: 10 reps Bil LE on floor Other Standing Knee Exercises: 3D hip excursion 10x Other Standing Knee Exercises: Squat reach matrix with 5lb   Physical Therapy Assessment and Plan PT Assessment and Plan Clinical Impression Statement: Patient has met all goals and is no longer havig any pain or difficulty with functional tasks. Patient demosntrated thorough understanding of all exercises to be performed for home exercise program requiring minimal cuing.  PT Plan: Patient discharged with HEP    Goals PT Short Term Goals PT Short Term Goal 1: Patient's hip internal rotation and external rotation will improve to > 35 degrees indicatign impoved hip mobility and improved ability absorb impact during foot strike of gait PT Short Term Goal 1 - Progress: Met PT Short Term Goal 2: Patient hip extension to improve to > 10 degrees to indicate improved stride length durign gait.  PT Short Term Goal 2 - Progress: Met PT Long Term Goals PT Long Term Goal 1: No complain of pain >4 days with walking, sit to stand transfers and performance of house cleaning.  PT Long Term Goal 1 - Progress: Met PT Long Term Goal 2: Patient's hip abduction to improve to 4+/5 indicating improved hip strength and improved ability  to balance on one leg resulting in decrease strain on piriformis with gait.  PT Long Term Goal 2 - Progress: Met Long Term Goal 3: Patient's hip alignment to improve to having both anterior superior iliac spine in alignement indicating improved hip positioning  and decreased favoring of right LE with gait.  Long Term Goal 3 Progress: Met  Problem List Patient Active Problem List   Diagnosis Date Noted  . Abnormality of gait 09/26/2013  . Weakness of left hip 09/26/2013  . Diabetes mellitus, type II 06/15/2011  . Cardiomyopathy, nonischemic   . Hypertension   . HYPOTHYROIDISM 05/28/2009  . LEFT BUNDLE BRANCH BLOCK 05/28/2009  . GASTROESOPHAGEAL REFLUX DISEASE 05/28/2009  . Hyperlipidemia 05/14/2008  . VENTRICULAR TACHYCARDIA 05/14/2008  . CEREBROVASCULAR DISEASE 05/14/2008    PT - End of Session Activity Tolerance: Patient tolerated treatment well General Behavior During Therapy: WFL for tasks assessed/performed  GP Functional Assessment Tool Used: FOTO 11%l limited, was 28% limited changing and maintaining body position Functional Limitation: Changing and maintaining body position Changing and Maintaining Body Position Goal Status (N1700): At least 1 percent but less than 20 percent impaired, limited or restricted Changing and Maintaining Body Position Discharge Status [phone]): At least 1 percent but less than 20 percent impaired, limited or restricted  Leia Alf 10/25/2013, 9:31 AM  Physician Documentation Your signature is required to indicate approval of the treatment plan as stated above.  Please sign and either send electronically or make a copy of this report for your files and return this physician signed original.   Please mark one 1.__approve of plan  2. ___approve of plan with the following conditions.   ______________________________                                                          _____________________ Physician Signature                                                                                                             Date

## 2013-11-19 ENCOUNTER — Other Ambulatory Visit (HOSPITAL_COMMUNITY): Payer: Self-pay | Admitting: Pulmonary Disease

## 2013-11-19 DIAGNOSIS — Z1231 Encounter for screening mammogram for malignant neoplasm of breast: Secondary | ICD-10-CM

## 2013-11-20 ENCOUNTER — Encounter: Payer: Self-pay | Admitting: Cardiology

## 2013-11-20 ENCOUNTER — Ambulatory Visit (INDEPENDENT_AMBULATORY_CARE_PROVIDER_SITE_OTHER): Payer: Medicare Other | Admitting: Cardiology

## 2013-11-20 VITALS — BP 128/64 | HR 56 | Ht 61.0 in | Wt 106.0 lb

## 2013-11-20 DIAGNOSIS — I472 Ventricular tachycardia, unspecified: Secondary | ICD-10-CM

## 2013-11-20 DIAGNOSIS — I059 Rheumatic mitral valve disease, unspecified: Secondary | ICD-10-CM

## 2013-11-20 DIAGNOSIS — I340 Nonrheumatic mitral (valve) insufficiency: Secondary | ICD-10-CM

## 2013-11-20 DIAGNOSIS — I428 Other cardiomyopathies: Secondary | ICD-10-CM

## 2013-11-20 DIAGNOSIS — I4729 Other ventricular tachycardia: Secondary | ICD-10-CM

## 2013-11-20 NOTE — Patient Instructions (Signed)
Your physician recommends that you schedule a follow-up appointment in: 3 months with Dr. Harl Bowie  Thank you for choosing [facility]!!

## 2013-11-20 NOTE — Progress Notes (Signed)
Clinical Summary Donna [name] is a 73 y.o.female seen today for follow up of the following medical problems  1. NICM  - long history of cardiomyopathy, prior caths without obstructive disease  - LVEF 20% by echo 06/2011,  - she has refused ICD previously  -  denies any SOB or DOE. Walks 2 miles daily without troubles.  - no orthopnea, no PND, no LE edema  - denies any lightheadedness or dizziness. Medical therapy has been somewhat limited in the past due to lightheadedness and fatigue however she is tolerating her current regimen well   2. Ventricular tachycardia  - on amiodarone daily, she has refused a ICD in the past  - 03/2013 normal LFTs, reports she has full panel coming up with Dr Luan Pulling in July    Past Medical History  Diagnosis Date  . Cardiomyopathy, nonischemic     Nl cors in 1989 and 1998; CHF in 12/97; EF of 40% in 5/01 and 7/03, 25% in 9/04 and 4/08.  systolic murmur without valvular abnormalities by echo; refused automatic implantable cardiac defibrillator  . Left bundle branch block   . Hypertension   . Hyperlipidemia   . Diabetes mellitus, type II     No insulin  . Cerebrovascular disease     Duplex study in 12/2006 shows atherosclerosis without focal stenosis  . Atrial flutter     Versus ventricular tachycardia; treated with amiodarone  . Gastroesophageal reflux disease      Allergies  Allergen Reactions  . Codeine     REACTION: rapid HB  . Decongestant [Pseudoephedrine Hcl Er]     Heart races     Current Outpatient Prescriptions  Medication Sig Dispense Refill  . amiodarone (PACERONE) 200 MG tablet TAKE 1/2 TABLET ONCE DAILY  30 tablet  9  . aspirin 81 MG tablet Take 81 mg by mouth daily.      . calcium gluconate 500 MG tablet Take 500 mg by mouth 2 (two) times daily.        . carvedilol (COREG) 6.25 MG tablet Take 1 tablet (6.25 mg total) by mouth 2 (two) times daily.  180 tablet  3  . digoxin (LANOXIN) 0.125 MG tablet TAKE ONE TABLET BY  MOUTH EVERY DAY  30 tablet  6  . fish oil-omega-3 fatty acids 1000 MG capsule Take 1 capsule by mouth 3 (three) times daily.        . furosemide (LASIX) 40 MG tablet TAKE 1 & 1/2 TABLETS DAILY  45 tablet  11  . levothyroxine (SYNTHROID, LEVOTHROID) 75 MCG tablet Take 75 mcg by mouth daily.        Marland Kitchen losartan (COZAAR) 100 MG tablet Take 1 tablet (100 mg total) by mouth daily.  90 tablet  3  . omeprazole (PRILOSEC) 20 MG capsule Take 20 mg by mouth 2 (two) times daily.        . potassium chloride (KLOR-CON) 20 MEQ packet Take 20 mEq by mouth 2 (two) times daily.        . saxagliptin HCl (ONGLYZA) 2.5 MG TABS tablet Take 2.5 mg by mouth daily.      . simvastatin (ZOCOR) 40 MG tablet Take 40 mg by mouth at bedtime.        Marland Kitchen spironolactone (ALDACTONE) 25 MG tablet Take 25 mg by mouth daily.         No current facility-administered medications for this visit.     Past Surgical History  Procedure Laterality Date  .  Hernia repair      x2  . Abdominal hysterectomy  2006  . Colonoscopy  2008     Allergies  Allergen Reactions  . Codeine     REACTION: rapid HB  . Decongestant [Pseudoephedrine Hcl Er]     Heart races      Family History  Problem Relation Age of Onset  . Diabetes Mother   . Kidney disease Mother   . Diabetes Sister   . Heart attack Brother      Social History Donna [name] reports that she has never smoked. She does not have any smokeless tobacco history on file. Donna [name] reports that she does not drink alcohol.   Review of Systems CONSTITUTIONAL: No weight loss, fever, chills, weakness or fatigue.  HEENT: Eyes: No visual loss, blurred vision, double vision or yellow sclerae.No hearing loss, sneezing, congestion, runny nose or sore throat.  SKIN: No rash or itching.  CARDIOVASCULAR: per HPI RESPIRATORY: No shortness of breath, cough or sputum.  GASTROINTESTINAL: No anorexia, nausea, vomiting or diarrhea. No abdominal pain or blood.  GENITOURINARY: No  burning on urination, no polyuria NEUROLOGICAL: No headache, dizziness, syncope, paralysis, ataxia, numbness or tingling in the extremities. No change in bowel or bladder control.  MUSCULOSKELETAL: No muscle, back pain, joint pain or stiffness.  LYMPHATICS: No enlarged nodes. No history of splenectomy.  PSYCHIATRIC: No history of depression or anxiety.  ENDOCRINOLOGIC: No reports of sweating, cold or heat intolerance. No polyuria or polydipsia.  Marland Kitchen   Physical Examination p 56 bp 128/64 Wt 106 lbs BMI 20 Gen: resting comfortably, no acute distress HEENT: no scleral icterus, pupils equal round and reactive, no palptable cervical adenopathy,  CV: RRR, 2/6 systolic murmur at apex, no JVD Resp: Clear to auscultation bilaterally GI: abdomen is soft, non-tender, non-distended, normal bowel sounds, no hepatosplenomegaly MSK: extremities are warm, no edema.  Skin: warm, no rash Neuro:  no focal deficits Psych: appropriate affect   Diagnostic Studies 06/2011 Echo  LVEF 20%, mildly dilated, mild LVH, grade I diastolic dysfunction, mild to mod MR, PASP 36   10/04/13 Echo Study Conclusions  - Left ventricle: Left ventricular systolic function is severely reduced, EF 15-20%. Severe global hypokinesis to akinesis is seen. The cavity size was moderately to severely dilated. Wall thickness was increased in a pattern of mild LVH. Doppler parameters are consistent with abnormal left ventricular relaxation (grade 1 diastolic dysfunction). Doppler parameters are consistent with high ventricular filling pressure. - Regional wall motion abnormality: Akinesis of the mid anterior, mid anteroseptal, basal-mid inferoseptal, apical septal, and apical myocardium; severe hypokinesis of the mid inferior and apical lateral myocardium; moderate hypokinesis of the mid inferolateral and basal-mid anterolateral myocardium. - Aortic valve: Mildly calcified annulus. Mildly thickened leaflets. - Mitral valve:  Mildly dilated annulus. Mildly thickened leaflets . Mild to moderate regurgitation. - Left atrium: The atrium was moderately dilated. - Tricuspid valve: Mild regurgitation. - Pulmonary arteries: PA peak pressure: 72mm Hg (S). Mildly elevated pulmonary pressures. - Systemic veins: IVC is dilated, with normal respiratory variation. Estimated CVP 8 mmHg. - Pericardium, extracardiac: A trivial pericardial effusion was identified posterior to the heart.       Assessment and Plan  1. NICM  - NYHA II, she has refused ICD in the past, she is euvolemic in clinic today  - continue current meds, given low heart rate will not increase beta blocker this visit.   2. Ventricular tachycardia  - continue amiodarone, she continues to refuse ICD  - will  follow up labs in June, follow thyroid and liver tests while on amio  3. Mitral regurgitation  - mild to mod MR which remains stable on repeat echo 10/2013 - continue to follow  F/u 3 months         Arnoldo Lenis, M.D., F.A.C.C.

## 2013-12-26 ENCOUNTER — Ambulatory Visit (HOSPITAL_COMMUNITY)
Admission: RE | Admit: 2013-12-26 | Discharge: 2013-12-26 | Disposition: A | Discharge status: Home / Self Care | Payer: Medicare Other | Source: Ambulatory Visit | Attending: Pulmonary Disease | Admitting: Pulmonary Disease

## 2013-12-26 DIAGNOSIS — Z1231 Encounter for screening mammogram for malignant neoplasm of breast: Secondary | ICD-10-CM

## 2014-02-19 ENCOUNTER — Encounter: Payer: Self-pay | Admitting: Cardiology

## 2014-02-19 ENCOUNTER — Ambulatory Visit (INDEPENDENT_AMBULATORY_CARE_PROVIDER_SITE_OTHER): Payer: Medicare Other | Admitting: Cardiology

## 2014-02-19 VITALS — BP 118/60 | HR 68 | Ht 61.0 in | Wt 109.0 lb

## 2014-02-19 DIAGNOSIS — I428 Other cardiomyopathies: Secondary | ICD-10-CM

## 2014-02-19 DIAGNOSIS — I340 Nonrheumatic mitral (valve) insufficiency: Secondary | ICD-10-CM

## 2014-02-19 DIAGNOSIS — I4729 Other ventricular tachycardia: Secondary | ICD-10-CM

## 2014-02-19 DIAGNOSIS — I472 Ventricular tachycardia: Secondary | ICD-10-CM

## 2014-02-19 DIAGNOSIS — I059 Rheumatic mitral valve disease, unspecified: Secondary | ICD-10-CM

## 2014-02-19 DIAGNOSIS — I5022 Chronic systolic (congestive) heart failure: Secondary | ICD-10-CM

## 2014-02-19 NOTE — Progress Notes (Signed)
Clinical Summary Donna [name] is a 73 y.o.female seen today for follow up of the following medical problems.   1. NICM  - long history of cardiomyopathy, prior caths without obstructive disease  - LVEF 20% by echo 06/2011,  - she has refused ICD previously  - denies any SOB or DOE. Walks 2 miles daily without troubles.  - no orthopnea, no PND, no LE edema  - denies any lightheadedness or dizziness. Medical therapy has been somewhat limited in the past due to lightheadedness and fatigue however she is tolerating her current regimen well   2. Ventricular tachycardia  - on amiodarone daily, she has refused a ICD in the past  - 03/2013 normal LFTs, reports she has full panel coming up with Donna [name] in July  3. Hyperlipidemia - compliant with statin - 05/2013 LDL 58 HDL 37 TG 101  4. Dizziness - describes some dizziness since recent ear infection, has completed course of antibiotics.  Past Medical History  Diagnosis Date  . Cardiomyopathy, nonischemic     Nl cors in 1989 and 1998; CHF in 12/97; EF of 40% in 5/01 and 7/03, 25% in 9/04 and 4/08.  systolic murmur without valvular abnormalities by echo; refused automatic implantable cardiac defibrillator  . Left bundle Donna [name] block   . Hypertension   . Hyperlipidemia   . Diabetes mellitus, type II     No insulin  . Cerebrovascular disease     Duplex study in 12/2006 shows atherosclerosis without focal stenosis  . Atrial flutter     Versus ventricular tachycardia; treated with amiodarone  . Gastroesophageal reflux disease      Allergies  Allergen Reactions  . Codeine     REACTION: rapid HB  . Decongestant [Pseudoephedrine Hcl Er]     Heart races     Current Outpatient Prescriptions  Medication Sig Dispense Refill  . amiodarone (PACERONE) 200 MG tablet TAKE 1/2 TABLET ONCE DAILY  30 tablet  9  . aspirin 81 MG tablet Take 81 mg by mouth daily.      . calcium gluconate 500 MG tablet Take 500 mg by mouth 2 (two) times  daily.        . carvedilol (COREG) 6.25 MG tablet Take 1 tablet (6.25 mg total) by mouth 2 (two) times daily.  180 tablet  3  . digoxin (LANOXIN) 0.125 MG tablet TAKE ONE TABLET BY MOUTH EVERY DAY  30 tablet  6  . fish oil-omega-3 fatty acids 1000 MG capsule Take 1 capsule by mouth 3 (three) times daily.        . furosemide (LASIX) 40 MG tablet TAKE 1 & 1/2 TABLETS DAILY  45 tablet  11  . levothyroxine (SYNTHROID, LEVOTHROID) 75 MCG tablet Take 75 mcg by mouth daily.        Marland Kitchen losartan (COZAAR) 100 MG tablet Take 1 tablet (100 mg total) by mouth daily.  90 tablet  3  . omeprazole (PRILOSEC) 20 MG capsule Take 20 mg by mouth 2 (two) times daily.        . potassium chloride (KLOR-CON) 20 MEQ packet Take 20 mEq by mouth 2 (two) times daily.        . simvastatin (ZOCOR) 40 MG tablet Take 40 mg by mouth at bedtime.        Marland Kitchen spironolactone (ALDACTONE) 25 MG tablet Take 25 mg by mouth daily.         No current facility-administered medications for this visit.  Past Surgical History  Procedure Laterality Date  . Hernia repair      x2  . Abdominal hysterectomy  2006  . Colonoscopy  2008     Allergies  Allergen Reactions  . Codeine     REACTION: rapid HB  . Decongestant [Pseudoephedrine Hcl Er]     Heart races      Family History  Problem Relation Age of Onset  . Diabetes Mother   . Kidney disease Mother   . Diabetes Sister   . Heart attack Brother      Social History Donna [name] reports that she has never smoked. She does not have any smokeless tobacco history on file. Donna [name] reports that she does not drink alcohol.   Review of Systems CONSTITUTIONAL: No weight loss, fever, chills, weakness or fatigue.  HEENT: Eyes: No visual loss, blurred vision, double vision or yellow sclerae.No hearing loss, sneezing, congestion, runny nose or sore throat.  SKIN: No rash or itching.  CARDIOVASCULAR: per HPI RESPIRATORY: No shortness of breath, cough or sputum.    GASTROINTESTINAL: No anorexia, nausea, vomiting or diarrhea. No abdominal pain or blood.  GENITOURINARY: No burning on urination, no polyuria NEUROLOGICAL: dizziness.  MUSCULOSKELETAL: No muscle, back pain, joint pain or stiffness.  LYMPHATICS: No enlarged nodes. No history of splenectomy.  PSYCHIATRIC: No history of depression or anxiety.  ENDOCRINOLOGIC: No reports of sweating, cold or heat intolerance. No polyuria or polydipsia.  Marland Kitchen   Physical Examination p 68 bp 118/60 Wt 109 lbs BMI 21 Gen: resting comfortably, no acute distress HEENT: no scleral icterus, pupils equal round and reactive, no palptable cervical adenopathy,  CV: RRR, 3/6 systolic murmur at apex, no JVD Resp: Clear to auscultation bilaterally GI: abdomen is soft, non-tender, non-distended, normal bowel sounds, no hepatosplenomegaly MSK: extremities are warm, no edema.  Skin: warm, no rash Neuro:  no focal deficits Psych: appropriate affect   Diagnostic Studies 06/2011 Echo  LVEF 20%, mildly dilated, mild LVH, grade I diastolic dysfunction, mild to mod MR, PASP 36   10/04/13 Echo  Study Conclusions  - Left ventricle: Left ventricular systolic function is severely reduced, EF 15-20%. Severe global hypokinesis to akinesis is seen. The cavity size was moderately to severely dilated. Wall thickness was increased in a pattern of mild LVH. Doppler parameters are consistent with abnormal left ventricular relaxation (grade 1 diastolic dysfunction). Doppler parameters are consistent with high ventricular filling pressure. - Regional wall motion abnormality: Akinesis of the mid anterior, mid anteroseptal, basal-mid inferoseptal, apical septal, and apical myocardium; severe hypokinesis of the mid inferior and apical lateral myocardium; moderate hypokinesis of the mid inferolateral and basal-mid anterolateral myocardium. - Aortic valve: Mildly calcified annulus. Mildly thickened leaflets. - Mitral valve: Mildly  dilated annulus. Mildly thickened leaflets . Mild to moderate regurgitation. - Left atrium: The atrium was moderately dilated. - Tricuspid valve: Mild regurgitation. - Pulmonary arteries: PA peak pressure: 8mm Hg (S). Mildly elevated pulmonary pressures. - Systemic veins: IVC is dilated, with normal respiratory variation. Estimated CVP 8 mmHg. - Pericardium, extracardiac: A trivial pericardial effusion was identified posterior to the heart.       Assessment and Plan   1. NICM/Chronic systolic HF  - LVEF 82-99%, NYHA II, she has refused ICD in the past, she is euvolemic in clinic today  - continue current meds  2. Ventricular tachycardia  - continue amiodarone, she continues to refuse ICD  - will request most recent labs from Donna [name], if does not include liver panel and  TSH will order next visit  3. Mitral regurgitation  - mild to mod MR which remains stable on repeat echo 10/2013  - continue to follow  4. Dizziness - appears related to recent ear infection, not orthostatic by history - if persists can consider cutting down some of her bp meds   Arnoldo Lenis, M.D.

## 2014-02-19 NOTE — Patient Instructions (Signed)
Your physician recommends that you schedule a follow-up appointment in: 4 months with Dr. Harl Bowie  Your physician recommends that you continue on your current medications as directed. Please refer to the Current Medication list given to you today.  We will request your labs from Dr. Luan Pulling  Thank you for choosing [facility]!!

## 2014-02-20 ENCOUNTER — Telehealth: Payer: Self-pay | Admitting: *Deleted

## 2014-02-20 NOTE — Telephone Encounter (Signed)
Lab received, in Dr. Harl Bowie folder

## 2014-02-26 ENCOUNTER — Encounter: Payer: Self-pay | Admitting: Cardiology

## 2014-05-19 ENCOUNTER — Emergency Department (HOSPITAL_COMMUNITY)
Admission: EM | Admit: 2014-05-19 | Discharge: 2014-05-19 | Disposition: A | Discharge status: Home / Self Care | Payer: Medicare Other | Attending: Emergency Medicine | Admitting: Emergency Medicine

## 2014-05-19 ENCOUNTER — Emergency Department (HOSPITAL_COMMUNITY): Payer: Medicare Other

## 2014-05-19 DIAGNOSIS — I10 Essential (primary) hypertension: Secondary | ICD-10-CM | POA: Insufficient documentation

## 2014-05-19 DIAGNOSIS — Z7982 Long term (current) use of aspirin: Secondary | ICD-10-CM | POA: Insufficient documentation

## 2014-05-19 DIAGNOSIS — R0602 Shortness of breath: Secondary | ICD-10-CM | POA: Diagnosis present

## 2014-05-19 DIAGNOSIS — E871 Hypo-osmolality and hyponatremia: Secondary | ICD-10-CM | POA: Diagnosis not present

## 2014-05-19 DIAGNOSIS — Z79899 Other long term (current) drug therapy: Secondary | ICD-10-CM | POA: Diagnosis not present

## 2014-05-19 DIAGNOSIS — E785 Hyperlipidemia, unspecified: Secondary | ICD-10-CM | POA: Diagnosis not present

## 2014-05-19 DIAGNOSIS — I428 Other cardiomyopathies: Secondary | ICD-10-CM | POA: Diagnosis not present

## 2014-05-19 DIAGNOSIS — I509 Heart failure, unspecified: Secondary | ICD-10-CM | POA: Insufficient documentation

## 2014-05-19 DIAGNOSIS — K219 Gastro-esophageal reflux disease without esophagitis: Secondary | ICD-10-CM | POA: Diagnosis not present

## 2014-05-19 DIAGNOSIS — E119 Type 2 diabetes mellitus without complications: Secondary | ICD-10-CM | POA: Diagnosis not present

## 2014-05-19 LAB — COMPREHENSIVE METABOLIC PANEL
ALBUMIN: 3.8 g/dL (ref 3.5–5.2)
ALT: 18 U/L (ref 0–35)
ANION GAP: 12 (ref 5–15)
AST: 19 U/L (ref 0–37)
Alkaline Phosphatase: 66 U/L (ref 39–117)
BUN: 16 mg/dL (ref 6–23)
CO2: 25 mEq/L (ref 19–32)
CREATININE: 0.54 mg/dL (ref 0.50–1.10)
Calcium: 8.8 mg/dL (ref 8.4–10.5)
Chloride: 90 mEq/L — ABNORMAL LOW (ref 96–112)
GFR calc Af Amer: >90 mL/min (ref >90)
GFR calc non Af Amer: >90 mL/min (ref >90)
Glucose, Bld: 244 mg/dL — ABNORMAL HIGH (ref 70–99)
POTASSIUM: 4.1 meq/L (ref 3.7–5.3)
Sodium: 127 mEq/L — ABNORMAL LOW (ref 137–147)
TOTAL PROTEIN: 6.7 g/dL (ref 6.0–8.3)
Total Bilirubin: 0.4 mg/dL (ref 0.3–1.2)

## 2014-05-19 LAB — CBC WITH DIFFERENTIAL/PLATELET
BASOS ABS: 0.1 10*3/uL (ref 0.0–0.1)
BASOS PCT: 1 % (ref 0–1)
EOS ABS: 0.2 10*3/uL (ref 0.0–0.7)
Eosinophils Relative: 2 % (ref 0–5)
HCT: 36.4 % (ref 36.0–46.0)
HEMOGLOBIN: 12.4 g/dL (ref 12.0–15.0)
Lymphocytes Relative: 23 % (ref 12–46)
Lymphs Abs: 1.9 10*3/uL (ref 0.7–4.0)
MCH: 32.2 pg (ref 26.0–34.0)
MCHC: 34.1 g/dL (ref 30.0–36.0)
MCV: 94.5 fL (ref 78.0–100.0)
MONO ABS: 0.6 10*3/uL (ref 0.1–1.0)
MONOS PCT: 7 % (ref 3–12)
NEUTROS PCT: 67 % (ref 43–77)
Neutro Abs: 5.6 10*3/uL (ref 1.7–7.7)
Platelets: 172 10*3/uL (ref 150–400)
RBC: 3.85 MIL/uL — ABNORMAL LOW (ref 3.87–5.11)
RDW: 12.9 % (ref 11.5–15.5)
WBC: 8.3 10*3/uL (ref 4.0–10.5)

## 2014-05-19 LAB — TROPONIN I

## 2014-05-19 LAB — PRO B NATRIURETIC PEPTIDE: Pro B Natriuretic peptide (BNP): 1012 pg/mL — ABNORMAL HIGH (ref 0–125)

## 2014-05-19 MED ORDER — FUROSEMIDE 10 MG/ML IJ SOLN
40.0000 mg | Freq: Once | INTRAMUSCULAR | Status: AC
  Administered 2014-05-19: 40 mg via INTRAVENOUS
  Filled 2014-05-19: qty 4

## 2014-05-19 MED ORDER — ASPIRIN 81 MG PO CHEW
324.0000 mg | CHEWABLE_TABLET | Freq: Once | ORAL | Status: AC
  Administered 2014-05-19: 324 mg via ORAL
  Filled 2014-05-19: qty 4

## 2014-05-19 NOTE — ED Notes (Signed)
Pt with c/o sob at home,  80's sat at home, given 15 liters per ems, one sl nitro given enroute.

## 2014-05-19 NOTE — ED Notes (Signed)
Pt assisted to bedside commode & returned to bed w/ no complications.

## 2014-05-19 NOTE — ED Provider Notes (Signed)
CSN: [id number]     Arrival date & time 05/19/14  0222 History   First MD Initiated Contact with Patient 05/19/14 0257     Chief Complaint  Patient presents with  . Shortness of Breath     (Consider location/radiation/quality/duration/timing/severity/associated sxs/prior Treatment) Patient is a 73 y.o. female presenting with shortness of breath. The history is provided by the patient.  Shortness of Breath She started having difficulty breathing tonight at about 11 PM. She denies chest pain, heaviness, tightness, pressure. She denies cough or fever. Nothing makes symptoms better nothing makes them worse. She states she felt fine during the day. EMS noted oxygen saturation in the 80s and started her on oxygen and gave her nitroglycerin. She does take aspirin 81 mg with last dose being in the morning.   Past Medical History  Diagnosis Date  . Cardiomyopathy, nonischemic     Nl cors in 1989 and 1998; CHF in 12/97; EF of 40% in 5/01 and 7/03, 25% in 9/04 and 4/08.  systolic murmur without valvular abnormalities by echo; refused automatic implantable cardiac defibrillator  . Left bundle branch block   . Hypertension   . Hyperlipidemia   . Diabetes mellitus, type II     No insulin  . Cerebrovascular disease     Duplex study in 12/2006 shows atherosclerosis without focal stenosis  . Atrial flutter     Versus ventricular tachycardia; treated with amiodarone  . Gastroesophageal reflux disease    Past Surgical History  Procedure Laterality Date  . Hernia repair      x2  . Abdominal hysterectomy  2006  . Colonoscopy  2008   Family History  Problem Relation Age of Onset  . Diabetes Mother   . Kidney disease Mother   . Diabetes Sister   . Heart attack Brother    History  Substance Use Topics  . Smoking status: Never Smoker   . Smokeless tobacco: Never Used  . Alcohol Use: No   OB History    No data available     Review of Systems  Respiratory: Positive for shortness of breath.    All other systems reviewed and are negative.     Allergies  Codeine and Decongestant  Home Medications   Prior to Admission medications   Medication Sig Start Date End Date Taking? Authorizing Provider  amiodarone (PACERONE) 200 MG tablet TAKE 1/2 TABLET ONCE DAILY 10/03/13   Arnoldo Lenis, MD  aspirin 81 MG tablet Take 81 mg by mouth daily.    Historical Provider, MD  calcium gluconate 500 MG tablet Take 500 mg by mouth 2 (two) times daily.      Historical Provider, MD  carvedilol (COREG) 6.25 MG tablet Take 1 tablet (6.25 mg total) by mouth 2 (two) times daily. 10/01/13   Arnoldo Lenis, MD  digoxin (LANOXIN) 0.125 MG tablet TAKE ONE TABLET BY MOUTH EVERY DAY 06/27/13   Arnoldo Lenis, MD  fish oil-omega-3 fatty acids 1000 MG capsule Take 1 capsule by mouth 3 (three) times daily.      Historical Provider, MD  furosemide (LASIX) 40 MG tablet TAKE 1 & 1/2 TABLETS DAILY 06/19/12   Yehuda Savannah, MD  losartan (COZAAR) 100 MG tablet Take 1 tablet (100 mg total) by mouth daily. 08/06/13   Arnoldo Lenis, MD  omeprazole (PRILOSEC) 20 MG capsule Take 20 mg by mouth 2 (two) times daily.      Historical Provider, MD  potassium chloride (KLOR-CON) 20 MEQ packet Take  20 mEq by mouth 2 (two) times daily.      Historical Provider, MD  simvastatin (ZOCOR) 40 MG tablet Take 40 mg by mouth at bedtime.      Historical Provider, MD  spironolactone (ALDACTONE) 25 MG tablet Take 25 mg by mouth daily.      Historical Provider, MD   BP 153/90 mmHg  Pulse 84  Temp(Src) 97.5 F (36.4 C) (Oral)  Resp 26  Ht 5\' 1"  (1.549 m)  Wt 111 lb (50.349 kg)  BMI 20.98 kg/m2  SpO2 95% Physical Exam  Nursing note and vitals reviewed.  73 year old female, resting comfortably and in no acute distress. Vital signs are significant for hypertension and tachypnea. Oxygen saturation is 83%, which is hypoxic, but came up to 95% when placed on oxygen. Head is normocephalic and atraumatic. PERRLA, EOMI.  Oropharynx is clear. Neck is nontender and supple without adenopathy or JVD. Back is nontender and there is no CVA tenderness. Lungs have inspiratory crackles rather diffusely with some expiratory rhonchi. Chest is nontender. Heart has regular rate and rhythm without murmur. There is a prominent apical heave. Abdomen is soft, flat, nontender without masses or hepatosplenomegaly and peristalsis is normoactive. Extremities have no cyanosis or edema, full range of motion is present. Skin is warm and dry without rash. Neurologic: Mental status is normal, cranial nerves are intact, there are no motor or sensory deficits.  ED Course  Procedures (including critical care time) Labs Review Results for orders placed or performed during the hospital encounter of 05/19/14  CBC with Differential  Result Value Ref Range   WBC 8.3 4.0 - 10.5 K/uL   RBC 3.85 (L) 3.87 - 5.11 MIL/uL   Hemoglobin 12.4 12.0 - 15.0 g/dL   HCT 36.4 36.0 - 46.0 %   MCV 94.5 78.0 - 100.0 fL   MCH 32.2 26.0 - 34.0 pg   MCHC 34.1 30.0 - 36.0 g/dL   RDW 12.9 11.5 - 15.5 %   Platelets 172 150 - 400 K/uL   Neutrophils Relative % 67 43 - 77 %   Neutro Abs 5.6 1.7 - 7.7 K/uL   Lymphocytes Relative 23 12 - 46 %   Lymphs Abs 1.9 0.7 - 4.0 K/uL   Monocytes Relative 7 3 - 12 %   Monocytes Absolute 0.6 0.1 - 1.0 K/uL   Eosinophils Relative 2 0 - 5 %   Eosinophils Absolute 0.2 0.0 - 0.7 K/uL   Basophils Relative 1 0 - 1 %   Basophils Absolute 0.1 0.0 - 0.1 K/uL  Comprehensive metabolic panel  Result Value Ref Range   Sodium 127 (L) 137 - 147 mEq/L   Potassium 4.1 3.7 - 5.3 mEq/L   Chloride 90 (L) 96 - 112 mEq/L   CO2 25 19 - 32 mEq/L   Glucose, Bld 244 (H) 70 - 99 mg/dL   BUN 16 6 - 23 mg/dL   Creatinine, Ser 0.54 0.50 - 1.10 mg/dL   Calcium 8.8 8.4 - 10.5 mg/dL   Total Protein 6.7 6.0 - 8.3 g/dL   Albumin 3.8 3.5 - 5.2 g/dL   AST 19 0 - 37 U/L   ALT 18 0 - 35 U/L   Alkaline Phosphatase 66 39 - 117 U/L   Total  Bilirubin 0.4 0.3 - 1.2 mg/dL   GFR calc non Af Amer >90 >90 mL/min   GFR calc Af Amer >90 >90 mL/min   Anion gap 12 5 - 15  Troponin I  Result  Value Ref Range   Troponin I <0.30 <0.30 ng/mL  Pro b natriuretic peptide (BNP)  Result Value Ref Range   Pro B Natriuretic peptide (BNP) 1012.0 (H) 0 - 125 pg/mL    Imaging Review Dg Chest Portable 1 View  05/19/2014   CLINICAL DATA:  Congestion and wheezing beginning this evening.  EXAM: PORTABLE CHEST - 1 VIEW  COMPARISON:  Chest radiograph June 14, 2012  FINDINGS: The cardiac silhouette is mildly enlarged, calcified aortic knob. Diffuse interstitial prominence, increased from prior examination with patchy bibasilar airspace opacities. Small LEFT pleural effusion. No pneumothorax.  Calcifications in the neck are likely vascular. Osseous structures are nonsuspicious.  IMPRESSION: Mild cardiomegaly. Interstitial prominence and bibasilar airspace opacities with small LEFT pleural effusion, findings may reflect sequelae of broncho pneumonia, possibly pulmonary edema. Recommend followup chest radiograph after treatment to verify improvement.   Electronically Signed   By: Elon Alas   On: 05/19/2014 03:31     EKG Interpretation   Date/Time:  Monday May 19 2014 02:35:18 EST Ventricular Rate:  76 PR Interval:  215 QRS Duration: 180 QT Interval:  498 QTC Calculation: 560 R Axis:   -51 Text Interpretation:  Sinus rhythm Multiple ventricular premature  complexes Borderline prolonged PR interval Left atrial enlargement Left  bundle branch block When compared with ECG of 05/09/2008, Premature  ventricular complexes are now Present Confirmed by [facility]  MD, Kristen Fromm  ([id number]) on 05/19/2014 2:58:23 AM      MDM   Final diagnoses:  CHF exacerbation  Cardiomyopathy, nonischemic  Hyponatremia    Dyspnea which probably is an exacerbation of congestive heart failure. Old records are reviewed and she has a known history of ischemic  cardiomyopathy with ejection fraction of 15-20%. ECG shows left bundle branch block and PVCs but is essentially unchanged from prior. Screening labs including BNP have been obtained.  BNP has come back significantly elevated. There is no baseline value with which to compare. X-ray is consistent with CHF exacerbation. She is given a dose of furosemide and has had an adequate diuresis. Following this, she was ambulated around the ED and felt much better and did not have any oxygen desaturation. She is discharged with instructions to increase her furosemide dose for the next 3 days before returning to baseline dose.  Delora Fuel, MD 35/46/56 [id number]

## 2014-05-19 NOTE — ED Notes (Signed)
Pt ambulated around nurses station. O2 sats remained 92 to 96%. Pt states she feels fine & no SOB noticed. EDP notified.

## 2014-05-19 NOTE — Discharge Instructions (Signed)
Increase your furosemide (Lasix) to 80 mg (two tablets) every morning for the next three days. Stay on a low salt diet.  Heart Failure Heart failure is a condition in which the heart has trouble pumping blood. This means your heart does not pump blood efficiently for your body to work well. In some cases of heart failure, fluid may back up into your lungs or you may have swelling (edema) in your lower legs. Heart failure is usually a long-term (chronic) condition. It is important for you to take good care of yourself and follow your health care provider's treatment plan. CAUSES  Some health conditions can cause heart failure. Those health conditions include:  High blood pressure (hypertension). Hypertension causes the heart muscle to work harder than normal. When pressure in the blood vessels is high, the heart needs to pump (contract) with more force in order to circulate blood throughout the body. High blood pressure eventually causes the heart to become stiff and weak.  Coronary artery disease (CAD). CAD is the buildup of cholesterol and fat (plaque) in the arteries of the heart. The blockage in the arteries deprives the heart muscle of oxygen and blood. This can cause chest pain and may lead to a heart attack. High blood pressure can also contribute to CAD.  Heart attack (myocardial infarction). A heart attack occurs when one or more arteries in the heart become blocked. The loss of oxygen damages the muscle tissue of the heart. When this happens, part of the heart muscle dies. The injured tissue does not contract as well and weakens the heart's ability to pump blood.  Abnormal heart valves. When the heart valves do not open and close properly, it can cause heart failure. This makes the heart muscle pump harder to keep the blood flowing.  Heart muscle disease (cardiomyopathy or myocarditis). Heart muscle disease is damage to the heart muscle from a variety of causes. These can include drug or  alcohol abuse, infections, or unknown reasons. These can increase the risk of heart failure.  Lung disease. Lung disease makes the heart work harder because the lungs do not work properly. This can cause a strain on the heart, leading it to fail.  Diabetes. Diabetes increases the risk of heart failure. High blood sugar contributes to high fat (lipid) levels in the blood. Diabetes can also cause slow damage to tiny blood vessels that carry important nutrients to the heart muscle. When the heart does not get enough oxygen and food, it can cause the heart to become weak and stiff. This leads to a heart that does not contract efficiently.  Other conditions can contribute to heart failure. These include abnormal heart rhythms, thyroid problems, and low blood counts (anemia). Certain unhealthy behaviors can increase the risk of heart failure, including:  Being overweight.  Smoking or chewing tobacco.  Eating foods high in fat and cholesterol.  Abusing illicit drugs or alcohol.  Lacking physical activity. SYMPTOMS  Heart failure symptoms may vary and can be hard to detect. Symptoms may include:  Shortness of breath with activity, such as climbing stairs.  Persistent cough.  Swelling of the feet, ankles, legs, or abdomen.  Unexplained weight gain.  Difficulty breathing when lying flat (orthopnea).  Waking from sleep because of the need to sit up and get more air.  Rapid heartbeat.  Fatigue and loss of energy.  Feeling light-headed, dizzy, or close to fainting.  Loss of appetite.  Nausea.  Increased urination during the night (nocturia). DIAGNOSIS  A  diagnosis of heart failure is based on your history, symptoms, physical examination, and diagnostic tests. Diagnostic tests for heart failure may include:  Echocardiography.  Electrocardiography.  Chest X-ray.  Blood tests.  Exercise stress test.  Cardiac angiography.  Radionuclide scans. TREATMENT  Treatment is aimed  at managing the symptoms of heart failure. Medicines, behavioral changes, or surgical intervention may be necessary to treat heart failure.  Medicines to help treat heart failure may include:  Angiotensin-converting enzyme (ACE) inhibitors. This type of medicine blocks the effects of a blood protein called angiotensin-converting enzyme. ACE inhibitors relax (dilate) the blood vessels and help lower blood pressure.  Angiotensin receptor blockers (ARBs). This type of medicine blocks the actions of a blood protein called angiotensin. Angiotensin receptor blockers dilate the blood vessels and help lower blood pressure.  Water pills (diuretics). Diuretics cause the kidneys to remove salt and water from the blood. The extra fluid is removed through urination. This loss of extra fluid lowers the volume of blood the heart pumps.  Beta blockers. These prevent the heart from beating too fast and improve heart muscle strength.  Digitalis. This increases the force of the heartbeat.  Healthy behavior changes include:  Obtaining and maintaining a healthy weight.  Stopping smoking or chewing tobacco.  Eating heart-healthy foods.  Limiting or avoiding alcohol.  Stopping illicit drug use.  Physical activity as directed by your health care provider.  Surgical treatment for heart failure may include:  A procedure to open blocked arteries, repair damaged heart valves, or remove damaged heart muscle tissue.  A pacemaker to improve heart muscle function and control certain abnormal heart rhythms.  An internal cardioverter defibrillator to treat certain serious abnormal heart rhythms.  A left ventricular assist device (LVAD) to assist the pumping ability of the heart. HOME CARE INSTRUCTIONS   Take medicines only as directed by your health care provider. Medicines are important in reducing the workload of your heart, slowing the progression of heart failure, and improving your symptoms.  Do not stop  taking your medicine unless directed by your health care provider.  Do not skip any dose of medicine.  Refill your prescriptions before you run out of medicine. Your medicines are needed every day.  Engage in moderate physical activity if directed by your health care provider. Moderate physical activity can benefit some people. The elderly and people with severe heart failure should consult with a health care provider for physical activity recommendations.  Eat heart-healthy foods. Food choices should be free of trans fat and low in saturated fat, cholesterol, and salt (sodium). Healthy choices include fresh or frozen fruits and vegetables, fish, lean meats, legumes, fat-free or low-fat dairy products, and whole grain or high fiber foods. Talk to a dietitian to learn more about heart-healthy foods.  Limit sodium if directed by your health care provider. Sodium restriction may reduce symptoms of heart failure in some people. Talk to a dietitian to learn more about heart-healthy seasonings.  Use healthy cooking methods. Healthy cooking methods include roasting, grilling, broiling, baking, poaching, steaming, or stir-frying. Talk to a dietitian to learn more about healthy cooking methods.  Limit fluids if directed by your health care provider. Fluid restriction may reduce symptoms of heart failure in some people.  Weigh yourself every day. Daily weights are important in the early recognition of excess fluid. You should weigh yourself every morning after you urinate and before you eat breakfast. Wear the same amount of clothing each time you weigh yourself. Record your daily  weight. Provide your health care provider with your weight record.  Monitor and record your blood pressure if directed by your health care provider.  Check your pulse if directed by your health care provider.  Lose weight if directed by your health care provider. Weight loss may reduce symptoms of heart failure in some  people.  Stop smoking or chewing tobacco. Nicotine makes your heart work harder by causing your blood vessels to constrict. Do not use nicotine gum or patches before talking to your health care provider.  Keep all follow-up visits as directed by your health care provider. This is important.  Limit alcohol intake to no more than 1 drink per day for nonpregnant women and 2 drinks per day for men. One drink equals 12 ounces of beer, 5 ounces of wine, or 1 ounces of hard liquor. Drinking more than that is harmful to your heart. Tell your health care provider if you drink alcohol several times a week. Talk with your health care provider about whether alcohol is safe for you. If your heart has already been damaged by alcohol or you have severe heart failure, drinking alcohol should be stopped completely.  Stop illicit drug use.  Stay up-to-date with immunizations. It is especially important to prevent respiratory infections through current pneumococcal and influenza immunizations.  Manage other health conditions such as hypertension, diabetes, thyroid disease, or abnormal heart rhythms as directed by your health care provider.  Learn to manage stress.  Plan rest periods when fatigued.  Learn strategies to manage high temperatures. If the weather is extremely hot:  Avoid vigorous physical activity.  Use air conditioning or fans or seek a cooler location.  Avoid caffeine and alcohol.  Wear loose-fitting, lightweight, and light-colored clothing.  Learn strategies to manage cold temperatures. If the weather is extremely cold:  Avoid vigorous physical activity.  Layer clothes.  Wear mittens or gloves, a hat, and a scarf when going outside.  Avoid alcohol.  Obtain ongoing education and support as needed.  Participate in or seek rehabilitation as needed to maintain or improve independence and quality of life. SEEK MEDICAL CARE IF:   Your weight increases by 03 lb/1.4 kg in 1 day or 05  lb/2.3 kg in a week.  You have increasing shortness of breath that is unusual for you.  You are unable to participate in your usual physical activities.  You tire easily.  You cough more than normal, especially with physical activity.  You have any or more swelling in areas such as your hands, feet, ankles, or abdomen.  You are unable to sleep because it is hard to breathe.  You feel like your heart is beating fast (palpitations).  You become dizzy or light-headed upon standing up. SEEK IMMEDIATE MEDICAL CARE IF:   You have difficulty breathing.  There is a change in mental status such as decreased alertness or difficulty with concentration.  You have a pain or discomfort in your chest.  You have an episode of fainting (syncope). MAKE SURE YOU:   Understand these instructions.  Will watch your condition.  Will get help right away if you are not doing well or get worse. Document Released: 05/23/2005 Document Revised: 10/07/2013 Document Reviewed: 06/22/2012 [facility] Patient Information 2015 [place], [place]. This information is not intended to replace advice given to you by your health care provider. Make sure you discuss any questions you have with your health care provider.

## 2014-05-20 ENCOUNTER — Telehealth: Payer: Self-pay | Admitting: *Deleted

## 2014-05-20 NOTE — Telephone Encounter (Signed)
Seen in ED 12/14. Pt has 06/25/14 f/u scheduled with you. Is that still ok with you

## 2014-05-20 NOTE — Telephone Encounter (Signed)
Fyi.. Pt was een in ed and increase fluid pill to 2 1/2 pills

## 2014-05-20 NOTE — Telephone Encounter (Signed)
Will route to [employer]

## 2014-05-20 NOTE — Telephone Encounter (Signed)
Can we have her follow up with [facility] first week in January.   [name] MD

## 2014-06-16 DIAGNOSIS — I499 Cardiac arrhythmia, unspecified: Secondary | ICD-10-CM | POA: Diagnosis not present

## 2014-06-16 DIAGNOSIS — I110 Hypertensive heart disease with heart failure: Secondary | ICD-10-CM | POA: Diagnosis not present

## 2014-06-16 DIAGNOSIS — E1165 Type 2 diabetes mellitus with hyperglycemia: Secondary | ICD-10-CM | POA: Diagnosis not present

## 2014-06-16 DIAGNOSIS — R42 Dizziness and giddiness: Secondary | ICD-10-CM | POA: Diagnosis not present

## 2014-06-17 DIAGNOSIS — R42 Dizziness and giddiness: Secondary | ICD-10-CM | POA: Diagnosis not present

## 2014-06-17 DIAGNOSIS — E1165 Type 2 diabetes mellitus with hyperglycemia: Secondary | ICD-10-CM | POA: Diagnosis not present

## 2014-06-17 DIAGNOSIS — I110 Hypertensive heart disease with heart failure: Secondary | ICD-10-CM | POA: Diagnosis not present

## 2014-06-17 DIAGNOSIS — I499 Cardiac arrhythmia, unspecified: Secondary | ICD-10-CM | POA: Diagnosis not present

## 2014-06-25 ENCOUNTER — Encounter: Payer: Self-pay | Admitting: Cardiology

## 2014-06-25 ENCOUNTER — Ambulatory Visit (INDEPENDENT_AMBULATORY_CARE_PROVIDER_SITE_OTHER): Payer: 59 | Admitting: Cardiology

## 2014-06-25 VITALS — BP 106/72 | HR 61 | Ht 61.0 in | Wt 109.0 lb

## 2014-06-25 DIAGNOSIS — I340 Nonrheumatic mitral (valve) insufficiency: Secondary | ICD-10-CM

## 2014-06-25 DIAGNOSIS — R42 Dizziness and giddiness: Secondary | ICD-10-CM

## 2014-06-25 DIAGNOSIS — I429 Cardiomyopathy, unspecified: Secondary | ICD-10-CM | POA: Diagnosis not present

## 2014-06-25 DIAGNOSIS — I472 Ventricular tachycardia: Secondary | ICD-10-CM | POA: Diagnosis not present

## 2014-06-25 DIAGNOSIS — Z136 Encounter for screening for cardiovascular disorders: Secondary | ICD-10-CM

## 2014-06-25 DIAGNOSIS — I5022 Chronic systolic (congestive) heart failure: Secondary | ICD-10-CM | POA: Diagnosis not present

## 2014-06-25 DIAGNOSIS — I428 Other cardiomyopathies: Secondary | ICD-10-CM

## 2014-06-25 DIAGNOSIS — I4729 Other ventricular tachycardia: Secondary | ICD-10-CM

## 2014-06-25 MED ORDER — CARVEDILOL 3.125 MG PO TABS: 3.1250 mg | ORAL_TABLET | Freq: Two times a day (BID) | ORAL | Status: DC

## 2014-06-25 NOTE — Progress Notes (Signed)
Clinical Summary Ms. Kawamoto is a 74 y.o.female seen today for follow up of the following medical problems.   1. NICM  - long history of cardiomyopathy, prior caths without obstructive disease  - LVEF 20% by echo 06/2011,  - she has refused ICD previously   - seen in ER 05/19/14 with increased SOB. Evidence of volume overload, lasix increased for 3 days and patient discharged from ER.   - last clinic weight 109 lbs, stable weight today.   2. Ventricular tachycardia  - on amiodarone daily, she has refused a ICD in the past  - normal LFTs 05/2014, no recent TSH in our system  3. Hyperlipidemia - compliant with statin  4. Dizziness - ongoing history of dizziness. She had an ear infection in the near past and symptoms seemed to have developed after. Reports symptoms worst over the last few weeks. Dizziness now mainly with walking. No palpitations. No syncope.   Past Medical History  Diagnosis Date  . Cardiomyopathy, nonischemic     Nl cors in 1989 and 1998; CHF in 12/97; EF of 40% in 5/01 and 7/03, 25% in 9/04 and 4/08.  systolic murmur without valvular abnormalities by echo; refused automatic implantable cardiac defibrillator  . Left bundle branch block   . Hypertension   . Hyperlipidemia   . Diabetes mellitus, type II     No insulin  . Cerebrovascular disease     Duplex study in 12/2006 shows atherosclerosis without focal stenosis  . Atrial flutter     Versus ventricular tachycardia; treated with amiodarone  . Gastroesophageal reflux disease      Allergies  Allergen Reactions  . Codeine     REACTION: rapid HB  . Decongestant [Pseudoephedrine Hcl Er]     Heart races     Current Outpatient Prescriptions  Medication Sig Dispense Refill  . amiodarone (PACERONE) 200 MG tablet TAKE 1/2 TABLET ONCE DAILY 30 tablet 9  . aspirin 81 MG tablet Take 81 mg by mouth daily.    . calcium gluconate 500 MG tablet Take 500 mg by mouth 2 (two) times daily.      . carvedilol  (COREG) 6.25 MG tablet Take 1 tablet (6.25 mg total) by mouth 2 (two) times daily. 180 tablet 3  . digoxin (LANOXIN) 0.125 MG tablet TAKE ONE TABLET BY MOUTH EVERY DAY 30 tablet 6  . fish oil-omega-3 fatty acids 1000 MG capsule Take 1 capsule by mouth 3 (three) times daily.      . furosemide (LASIX) 40 MG tablet TAKE 1 & 1/2 TABLETS DAILY 45 tablet 11  . losartan (COZAAR) 100 MG tablet Take 1 tablet (100 mg total) by mouth daily. 90 tablet 3  . omeprazole (PRILOSEC) 20 MG capsule Take 20 mg by mouth 2 (two) times daily.      . potassium chloride (KLOR-CON) 20 MEQ packet Take 20 mEq by mouth 2 (two) times daily.      . simvastatin (ZOCOR) 40 MG tablet Take 40 mg by mouth at bedtime.      Marland Kitchen spironolactone (ALDACTONE) 25 MG tablet Take 25 mg by mouth daily.       No current facility-administered medications for this visit.     Past Surgical History  Procedure Laterality Date  . Hernia repair      x2  . Abdominal hysterectomy  2006  . Colonoscopy  2008     Allergies  Allergen Reactions  . Codeine     REACTION: rapid HB  .  Decongestant [Pseudoephedrine Hcl Er]     Heart races      Family History  Problem Relation Age of Onset  . Diabetes Mother   . Kidney disease Mother   . Diabetes Sister   . Heart attack Brother      Social History Ms. Bley reports that she has never smoked. She has never used smokeless tobacco. Ms. Wessells reports that she does not drink alcohol.   Review of Systems CONSTITUTIONAL: No weight loss, fever, chills, weakness or fatigue.  HEENT: Eyes: No visual loss, blurred vision, double vision or yellow sclerae.No hearing loss, sneezing, congestion, runny nose or sore throat.  SKIN: No rash or itching.  CARDIOVASCULAR: per HPI RESPIRATORY: No shortness of breath, cough or sputum.  GASTROINTESTINAL: No anorexia, nausea, vomiting or diarrhea. No abdominal pain or blood.  GENITOURINARY: No burning on urination, no polyuria NEUROLOGICAL: No  headache, dizziness, syncope, paralysis, ataxia, numbness or tingling in the extremities. No change in bowel or bladder control.  MUSCULOSKELETAL: No muscle, back pain, joint pain or stiffness.  LYMPHATICS: No enlarged nodes. No history of splenectomy.  PSYCHIATRIC: No history of depression or anxiety.  ENDOCRINOLOGIC: No reports of sweating, cold or heat intolerance. No polyuria or polydipsia.  Marland Kitchen   Physical Examination p 61 bp 106/72 Wt 109 lbs BMI 21  Orthostatics Lying p 58 bp 118/62 Sitting p 57 bp 124/64 Standing p 54 bp 118/58  Gen: resting comfortably, no acute distress HEENT: no scleral icterus, pupils equal round and reactive, no palptable cervical adenopathy,  CV: RRR, no m/r/g, no JVD, no carotid bruits Resp: Clear to auscultation bilaterally GI: abdomen is soft, non-tender, non-distended, normal bowel sounds, no hepatosplenomegaly MSK: extremities are warm, no edema.  Skin: warm, no rash Neuro:  no focal deficits Psych: appropriate affect   Diagnostic Studies 06/2011 Echo  LVEF 20%, mildly dilated, mild LVH, grade I diastolic dysfunction, mild to mod MR, PASP 36   10/04/13 Echo  Study Conclusions  - Left ventricle: Left ventricular systolic function is severely reduced, EF 15-20%. Severe global hypokinesis to akinesis is seen. The cavity size was moderately to severely dilated. Wall thickness was increased in a pattern of mild LVH. Doppler parameters are consistent with abnormal left ventricular relaxation (grade 1 diastolic dysfunction). Doppler parameters are consistent with high ventricular filling pressure. - Regional wall motion abnormality: Akinesis of the mid anterior, mid anteroseptal, basal-mid inferoseptal, apical septal, and apical myocardium; severe hypokinesis of the mid inferior and apical lateral myocardium; moderate hypokinesis of the mid inferolateral and basal-mid anterolateral myocardium. - Aortic valve: Mildly calcified annulus. Mildly  thickened leaflets. - Mitral valve: Mildly dilated annulus. Mildly thickened leaflets . Mild to moderate regurgitation. - Left atrium: The atrium was moderately dilated. - Tricuspid valve: Mild regurgitation. - Pulmonary arteries: PA peak pressure: 73mm Hg (S). Mildly elevated pulmonary pressures. - Systemic veins: IVC is dilated, with normal respiratory variation. Estimated CVP 8 mmHg. - Pericardium, extracardiac: A trivial pericardial effusion was identified posterior to the heart.       Assessment and Plan   1. NICM/Chronic systolic HF  - LVEF 27-03%, NYHA II, she has refused ICD in the past, she is euvolemic in clinic today  - continue current meds  2. Ventricular tachycardia  - continue amiodarone, she continues to refuse ICD  - normal LFTs, will need repeat TSH in next few months while on amio  3. Mitral regurgitation  - mild to mod MR which remains stable on repeat echo 10/2013  -  continue to follow  4. Dizziness - appears related to recent ear infection, not orthostatic by history or vitals in clinic - heart rates mildly decrased, will try deceasing coreg to 3.125mg  bid to see if help symptoms   F/u 1 month     Arnoldo Lenis, M.D.

## 2014-06-25 NOTE — Patient Instructions (Signed)
Your physician recommends that you schedule a follow-up appointment in: 1 Month with [name], NP  Your physician has recommended you make the following change in your medication:   Decrease Coreg to 3.125 mg tablet two times daily  Thank you for choosing [facility]!

## 2014-07-16 ENCOUNTER — Other Ambulatory Visit: Payer: Self-pay | Admitting: Cardiology

## 2014-07-22 ENCOUNTER — Emergency Department (HOSPITAL_COMMUNITY): Payer: Medicare Other

## 2014-07-22 ENCOUNTER — Inpatient Hospital Stay (HOSPITAL_COMMUNITY)
Admission: EM | Admit: 2014-07-22 | Discharge: 2014-07-24 | DRG: 292 | Disposition: A | Discharge status: Home / Self Care | Payer: Medicare Other | Attending: Pulmonary Disease | Admitting: Pulmonary Disease

## 2014-07-22 ENCOUNTER — Encounter (HOSPITAL_COMMUNITY): Payer: Self-pay

## 2014-07-22 ENCOUNTER — Emergency Department (HOSPITAL_COMMUNITY): Admit: 2014-07-22 | Discharge: 2014-07-22 | Disposition: A | Discharge status: Home / Self Care | Payer: Self-pay

## 2014-07-22 DIAGNOSIS — E785 Hyperlipidemia, unspecified: Secondary | ICD-10-CM | POA: Diagnosis present

## 2014-07-22 DIAGNOSIS — R531 Weakness: Secondary | ICD-10-CM | POA: Diagnosis not present

## 2014-07-22 DIAGNOSIS — I313 Pericardial effusion (noninflammatory): Secondary | ICD-10-CM | POA: Diagnosis not present

## 2014-07-22 DIAGNOSIS — Z79899 Other long term (current) drug therapy: Secondary | ICD-10-CM

## 2014-07-22 DIAGNOSIS — I5023 Acute on chronic systolic (congestive) heart failure: Principal | ICD-10-CM | POA: Diagnosis present

## 2014-07-22 DIAGNOSIS — R42 Dizziness and giddiness: Secondary | ICD-10-CM | POA: Diagnosis present

## 2014-07-22 DIAGNOSIS — K219 Gastro-esophageal reflux disease without esophagitis: Secondary | ICD-10-CM | POA: Diagnosis present

## 2014-07-22 DIAGNOSIS — I10 Essential (primary) hypertension: Secondary | ICD-10-CM | POA: Diagnosis present

## 2014-07-22 DIAGNOSIS — I340 Nonrheumatic mitral (valve) insufficiency: Secondary | ICD-10-CM | POA: Diagnosis not present

## 2014-07-22 DIAGNOSIS — Z888 Allergy status to other drugs, medicaments and biological substances status: Secondary | ICD-10-CM | POA: Diagnosis not present

## 2014-07-22 DIAGNOSIS — I472 Ventricular tachycardia: Secondary | ICD-10-CM | POA: Diagnosis present

## 2014-07-22 DIAGNOSIS — R0682 Tachypnea, not elsewhere classified: Secondary | ICD-10-CM | POA: Diagnosis not present

## 2014-07-22 DIAGNOSIS — I447 Left bundle-branch block, unspecified: Secondary | ICD-10-CM | POA: Diagnosis present

## 2014-07-22 DIAGNOSIS — Z885 Allergy status to narcotic agent status: Secondary | ICD-10-CM | POA: Diagnosis not present

## 2014-07-22 DIAGNOSIS — Z90710 Acquired absence of both cervix and uterus: Secondary | ICD-10-CM

## 2014-07-22 DIAGNOSIS — I679 Cerebrovascular disease, unspecified: Secondary | ICD-10-CM | POA: Diagnosis present

## 2014-07-22 DIAGNOSIS — I4892 Unspecified atrial flutter: Secondary | ICD-10-CM | POA: Diagnosis present

## 2014-07-22 DIAGNOSIS — Z7982 Long term (current) use of aspirin: Secondary | ICD-10-CM | POA: Diagnosis not present

## 2014-07-22 DIAGNOSIS — Z66 Do not resuscitate: Secondary | ICD-10-CM | POA: Diagnosis present

## 2014-07-22 DIAGNOSIS — R0600 Dyspnea, unspecified: Secondary | ICD-10-CM | POA: Diagnosis not present

## 2014-07-22 DIAGNOSIS — Z833 Family history of diabetes mellitus: Secondary | ICD-10-CM

## 2014-07-22 DIAGNOSIS — I509 Heart failure, unspecified: Secondary | ICD-10-CM

## 2014-07-22 DIAGNOSIS — I428 Other cardiomyopathies: Secondary | ICD-10-CM | POA: Diagnosis not present

## 2014-07-22 DIAGNOSIS — R011 Cardiac murmur, unspecified: Secondary | ICD-10-CM | POA: Diagnosis present

## 2014-07-22 DIAGNOSIS — E119 Type 2 diabetes mellitus without complications: Secondary | ICD-10-CM | POA: Diagnosis not present

## 2014-07-22 DIAGNOSIS — I429 Cardiomyopathy, unspecified: Secondary | ICD-10-CM | POA: Diagnosis not present

## 2014-07-22 DIAGNOSIS — I519 Heart disease, unspecified: Secondary | ICD-10-CM | POA: Diagnosis not present

## 2014-07-22 DIAGNOSIS — R918 Other nonspecific abnormal finding of lung field: Secondary | ICD-10-CM | POA: Diagnosis not present

## 2014-07-22 HISTORY — DX: Ventricular tachycardia, unspecified: I47.20

## 2014-07-22 HISTORY — DX: Ventricular tachycardia: I47.2

## 2014-07-22 LAB — BRAIN NATRIURETIC PEPTIDE: B Natriuretic Peptide: 626 pg/mL — ABNORMAL HIGH (ref 0.0–100.0)

## 2014-07-22 LAB — BASIC METABOLIC PANEL
Anion gap: 6 (ref 5–15)
BUN: 17 mg/dL (ref 6–23)
CO2: 25 mmol/L (ref 19–32)
CREATININE: 0.58 mg/dL (ref 0.50–1.10)
Calcium: 8.6 mg/dL (ref 8.4–10.5)
Chloride: 97 mmol/L (ref 96–112)
GFR calc Af Amer: >90 mL/min (ref >90)
GFR, EST NON AFRICAN AMERICAN: 89 mL/min — AB (ref >90)
GLUCOSE: 224 mg/dL — AB (ref 70–99)
POTASSIUM: 3.8 mmol/L (ref 3.5–5.1)
Sodium: 128 mmol/L — ABNORMAL LOW (ref 135–145)

## 2014-07-22 LAB — CBC
HEMATOCRIT: 38.8 % (ref 36.0–46.0)
HEMATOCRIT: 38.9 % (ref 36.0–46.0)
HEMOGLOBIN: 13.1 g/dL (ref 12.0–15.0)
Hemoglobin: 13.1 g/dL (ref 12.0–15.0)
MCH: 31.9 pg (ref 26.0–34.0)
MCH: 31.8 pg (ref 26.0–34.0)
MCHC: 33.8 g/dL (ref 30.0–36.0)
MCHC: 33.7 g/dL (ref 30.0–36.0)
MCV: 94.4 fL (ref 78.0–100.0)
MCV: 94.4 fL (ref 78.0–100.0)
PLATELETS: 164 10*3/uL (ref 150–400)
Platelets: 180 10*3/uL (ref 150–400)
RBC: 4.11 MIL/uL (ref 3.87–5.11)
RBC: 4.12 MIL/uL (ref 3.87–5.11)
RDW: 13.1 % (ref 11.5–15.5)
RDW: 13.1 % (ref 11.5–15.5)
WBC: 10.5 10*3/uL (ref 4.0–10.5)
WBC: 10.3 10*3/uL (ref 4.0–10.5)

## 2014-07-22 LAB — GLUCOSE, CAPILLARY
GLUCOSE-CAPILLARY: 222 mg/dL — AB (ref 70–99)
GLUCOSE-CAPILLARY: 149 mg/dL — AB (ref 70–99)
Glucose-Capillary: 130 mg/dL — ABNORMAL HIGH (ref 70–99)
Glucose-Capillary: 159 mg/dL — ABNORMAL HIGH (ref 70–99)

## 2014-07-22 LAB — CREATININE, SERUM
Creatinine, Ser: 0.6 mg/dL (ref 0.50–1.10)
GFR calc non Af Amer: 88 mL/min — ABNORMAL LOW (ref >90)

## 2014-07-22 LAB — TROPONIN I
TROPONIN I: 0.06 ng/mL — AB (ref <0.031)
Troponin I: 0.07 ng/mL — ABNORMAL HIGH (ref <0.031)
Troponin I: 0.1 ng/mL — ABNORMAL HIGH (ref <0.031)
Troponin I: 0.07 ng/mL — ABNORMAL HIGH (ref <0.031)

## 2014-07-22 MED ORDER — CARVEDILOL 3.125 MG PO TABS
3.1250 mg | ORAL_TABLET | Freq: Two times a day (BID) | ORAL | Status: DC
  Administered 2014-07-22 – 2014-07-24 (×5): 3.125 mg via ORAL
  Filled 2014-07-22 (×5): qty 1

## 2014-07-22 MED ORDER — POTASSIUM CHLORIDE CRYS ER 20 MEQ PO TBCR
20.0000 meq | EXTENDED_RELEASE_TABLET | Freq: Two times a day (BID) | ORAL | Status: DC
  Administered 2014-07-22 – 2014-07-24 (×5): 20 meq via ORAL
  Filled 2014-07-22 (×5): qty 1

## 2014-07-22 MED ORDER — FUROSEMIDE 10 MG/ML IJ SOLN
20.0000 mg | Freq: Two times a day (BID) | INTRAMUSCULAR | Status: DC
  Administered 2014-07-22 – 2014-07-23 (×3): 20 mg via INTRAVENOUS
  Filled 2014-07-22 (×3): qty 2

## 2014-07-22 MED ORDER — LOSARTAN POTASSIUM 50 MG PO TABS
100.0000 mg | ORAL_TABLET | Freq: Every day | ORAL | Status: DC
  Administered 2014-07-22 – 2014-07-24 (×3): 100 mg via ORAL
  Filled 2014-07-22 (×3): qty 2

## 2014-07-22 MED ORDER — CHLORHEXIDINE GLUCONATE 0.12 % MT SOLN
15.0000 mL | Freq: Two times a day (BID) | OROMUCOSAL | Status: DC
  Administered 2014-07-22 – 2014-07-24 (×5): 15 mL via OROMUCOSAL
  Filled 2014-07-22 (×4): qty 15

## 2014-07-22 MED ORDER — INSULIN ASPART 100 UNIT/ML ~~LOC~~ SOLN
0.0000 [IU] | Freq: Three times a day (TID) | SUBCUTANEOUS | Status: DC
  Administered 2014-07-22: 3 [IU] via SUBCUTANEOUS
  Administered 2014-07-22 – 2014-07-23 (×5): 1 [IU] via SUBCUTANEOUS
  Administered 2014-07-24: 2 [IU] via SUBCUTANEOUS

## 2014-07-22 MED ORDER — ASPIRIN EC 81 MG PO TBEC
81.0000 mg | DELAYED_RELEASE_TABLET | Freq: Every day | ORAL | Status: DC
  Administered 2014-07-22 – 2014-07-24 (×3): 81 mg via ORAL
  Filled 2014-07-22 (×3): qty 1

## 2014-07-22 MED ORDER — ONDANSETRON HCL 4 MG PO TABS: 4.0000 mg | ORAL_TABLET | Freq: Four times a day (QID) | ORAL | Status: DC | PRN

## 2014-07-22 MED ORDER — FUROSEMIDE 10 MG/ML IJ SOLN
80.0000 mg | Freq: Once | INTRAMUSCULAR | Status: AC
  Administered 2014-07-22: 80 mg via INTRAVENOUS
  Filled 2014-07-22: qty 8

## 2014-07-22 MED ORDER — ACETAMINOPHEN 325 MG PO TABS
650.0000 mg | ORAL_TABLET | Freq: Four times a day (QID) | ORAL | Status: DC | PRN
  Administered 2014-07-24: 650 mg via ORAL
  Filled 2014-07-22: qty 2

## 2014-07-22 MED ORDER — SODIUM CHLORIDE 0.9 % IV SOLN: 250.0000 mL | INTRAVENOUS | Status: DC | PRN

## 2014-07-22 MED ORDER — AMIODARONE HCL 200 MG PO TABS
100.0000 mg | ORAL_TABLET | Freq: Every day | ORAL | Status: DC
Start: 2014-07-22 — End: 2014-07-24
  Administered 2014-07-22 – 2014-07-24 (×3): 100 mg via ORAL
  Filled 2014-07-22 (×3): qty 1

## 2014-07-22 MED ORDER — SPIRONOLACTONE 25 MG PO TABS
25.0000 mg | ORAL_TABLET | Freq: Every day | ORAL | Status: DC
  Administered 2014-07-22 – 2014-07-24 (×3): 25 mg via ORAL
  Filled 2014-07-22 (×3): qty 1

## 2014-07-22 MED ORDER — ACETAMINOPHEN 650 MG RE SUPP: 650.0000 mg | Freq: Four times a day (QID) | RECTAL | Status: DC | PRN

## 2014-07-22 MED ORDER — ENOXAPARIN SODIUM 40 MG/0.4ML ~~LOC~~ SOLN
40.0000 mg | SUBCUTANEOUS | Status: DC
  Administered 2014-07-23 – 2014-07-24 (×2): 40 mg via SUBCUTANEOUS
  Filled 2014-07-22 (×2): qty 0.4

## 2014-07-22 MED ORDER — LEVALBUTEROL HCL 0.63 MG/3ML IN NEBU
INHALATION_SOLUTION | RESPIRATORY_TRACT | Status: AC
  Administered 2014-07-22: 0.63 mg
  Filled 2014-07-22: qty 3

## 2014-07-22 MED ORDER — SODIUM CHLORIDE 0.9 % IJ SOLN
3.0000 mL | Freq: Two times a day (BID) | INTRAMUSCULAR | Status: DC
  Administered 2014-07-22: 3 mL via INTRAVENOUS

## 2014-07-22 MED ORDER — ENOXAPARIN SODIUM 30 MG/0.3ML ~~LOC~~ SOLN
30.0000 mg | SUBCUTANEOUS | Status: DC
  Administered 2014-07-22: 30 mg via SUBCUTANEOUS
  Filled 2014-07-22: qty 0.3

## 2014-07-22 MED ORDER — CALCIUM GLUCONATE 500 MG PO TABS
500.0000 mg | ORAL_TABLET | Freq: Two times a day (BID) | ORAL | Status: DC
  Filled 2014-07-22 (×3): qty 1

## 2014-07-22 MED ORDER — IPRATROPIUM-ALBUTEROL 0.5-2.5 (3) MG/3ML IN SOLN
RESPIRATORY_TRACT | Status: AC
  Filled 2014-07-22: qty 3

## 2014-07-22 MED ORDER — SODIUM CHLORIDE 0.9 % IJ SOLN
3.0000 mL | Freq: Two times a day (BID) | INTRAMUSCULAR | Status: DC
  Administered 2014-07-23 (×2): 3 mL via INTRAVENOUS

## 2014-07-22 MED ORDER — CETYLPYRIDINIUM CHLORIDE 0.05 % MT LIQD
7.0000 mL | Freq: Two times a day (BID) | OROMUCOSAL | Status: DC
  Administered 2014-07-22 – 2014-07-23 (×4): 7 mL via OROMUCOSAL

## 2014-07-22 MED ORDER — CALCIUM CARBONATE 1250 (500 CA) MG PO TABS
1.0000 | ORAL_TABLET | Freq: Two times a day (BID) | ORAL | Status: DC
  Administered 2014-07-22 – 2014-07-24 (×5): 500 mg via ORAL
  Filled 2014-07-22 (×5): qty 1

## 2014-07-22 MED ORDER — DIGOXIN 125 MCG PO TABS
0.1250 mg | ORAL_TABLET | Freq: Every day | ORAL | Status: DC
  Administered 2014-07-22 – 2014-07-23 (×2): 0.125 mg via ORAL
  Filled 2014-07-22 (×2): qty 1

## 2014-07-22 MED ORDER — SIMVASTATIN 20 MG PO TABS: 40.0000 mg | ORAL_TABLET | Freq: Every day | ORAL | Status: DC

## 2014-07-22 MED ORDER — NITROGLYCERIN 2 % TD OINT
1.0000 [in_us] | TOPICAL_OINTMENT | Freq: Four times a day (QID) | TRANSDERMAL | Status: DC
  Administered 2014-07-22 – 2014-07-24 (×9): 1 [in_us] via TOPICAL
  Filled 2014-07-22 (×9): qty 1

## 2014-07-22 MED ORDER — ONDANSETRON HCL 4 MG/2ML IJ SOLN: 4.0000 mg | Freq: Four times a day (QID) | INTRAMUSCULAR | Status: DC | PRN

## 2014-07-22 MED ORDER — ATORVASTATIN CALCIUM 20 MG PO TABS
20.0000 mg | ORAL_TABLET | Freq: Every day | ORAL | Status: DC
  Administered 2014-07-22 – 2014-07-23 (×2): 20 mg via ORAL
  Filled 2014-07-22 (×2): qty 1

## 2014-07-22 MED ORDER — POTASSIUM CHLORIDE CRYS ER 20 MEQ PO TBCR
40.0000 meq | EXTENDED_RELEASE_TABLET | Freq: Once | ORAL | Status: AC
  Administered 2014-07-22: 40 meq via ORAL
  Filled 2014-07-22: qty 2

## 2014-07-22 MED ORDER — SODIUM CHLORIDE 0.9 % IJ SOLN
3.0000 mL | INTRAMUSCULAR | Status: DC | PRN
  Administered 2014-07-23 (×2): 3 mL via INTRAVENOUS
  Filled 2014-07-22 (×2): qty 3

## 2014-07-22 MED ORDER — IPRATROPIUM BROMIDE 0.02 % IN SOLN
RESPIRATORY_TRACT | Status: AC
  Administered 2014-07-22: 0.5 mg
  Filled 2014-07-22: qty 2.5

## 2014-07-22 NOTE — ED Notes (Signed)
Pt states she awoke this am with sob, started on c-pap per ems with improvement.  Pt denies cp

## 2014-07-22 NOTE — Progress Notes (Signed)
This is an assumption of care note. She was admitted early this morning with congestive heart failure. She is known to have CHF but had been pretty well controlled. She awoke with what sounds like PND early this morning and came to the emergency department where she was treated and admitted from there. She denies any chest pain.  Cardiology consultation has been requested. She is on IV Lasix. She is going to have a repeat echocardiogram.

## 2014-07-22 NOTE — Progress Notes (Signed)
*  PRELIMINARY RESULTS* Echocardiogram 2D Echocardiogram has been performed.  Leavy Cella 07/22/2014, 9:43 AM

## 2014-07-22 NOTE — ED Provider Notes (Signed)
CSN: [id number]     Arrival date & time 07/22/14  0200 History   First MD Initiated Contact with Patient 07/22/14 0201     Chief Complaint  Patient presents with  . Shortness of Breath   HPI The patient woke up this morning feeling acutely short of breath. Patient states she had been feeling well last evening. She was not having any trouble with any fevers or coughing. She denies any leg swelling She was not having any trouble with any chest pain. Symptoms were worse lying flat. She called 911 and was treated with cPAP.  Her symptoms improved somewhat.  She does have history of CHF. She denies any history of coronary artery disease, pulmonary embolism, or COPD. Past Medical History  Diagnosis Date  . Cardiomyopathy, nonischemic     Nl cors in 1989 and 1998; CHF in 12/97; EF of 40% in 5/01 and 7/03, 25% in 9/04 and 4/08.  systolic murmur without valvular abnormalities by echo; refused automatic implantable cardiac defibrillator  . Left bundle branch block   . Hypertension   . Hyperlipidemia   . Diabetes mellitus, type II     No insulin  . Cerebrovascular disease     Duplex study in 12/2006 shows atherosclerosis without focal stenosis  . Atrial flutter     Versus ventricular tachycardia; treated with amiodarone  . Gastroesophageal reflux disease    Past Surgical History  Procedure Laterality Date  . Hernia repair      x2  . Abdominal hysterectomy  2006  . Colonoscopy  2008   Family History  Problem Relation Age of Onset  . Diabetes Mother   . Kidney disease Mother   . Diabetes Sister   . Heart attack Brother    History  Substance Use Topics  . Smoking status: Never Smoker   . Smokeless tobacco: Never Used  . Alcohol Use: No   OB History    No data available     Review of Systems  All other systems reviewed and are negative.     Allergies  Codeine and Decongestant  Home Medications   Prior to Admission medications   Medication Sig Start Date End Date Taking?  Authorizing Provider  amiodarone (PACERONE) 200 MG tablet TAKE 1/2 TABLET ONCE DAILY 10/03/13   Arnoldo Lenis, MD  aspirin 81 MG tablet Take 81 mg by mouth daily.    Historical Provider, MD  calcium gluconate 500 MG tablet Take 500 mg by mouth 2 (two) times daily.      Historical Provider, MD  carvedilol (COREG) 3.125 MG tablet Take 1 tablet (3.125 mg total) by mouth 2 (two) times daily. 06/25/14   Arnoldo Lenis, MD  digoxin (LANOXIN) 0.125 MG tablet TAKE ONE TABLET BY MOUTH EVERY DAY 06/27/13   Arnoldo Lenis, MD  fish oil-omega-3 fatty acids 1000 MG capsule Take 1 capsule by mouth 3 (three) times daily.      Historical Provider, MD  furosemide (LASIX) 40 MG tablet TAKE 1 & 1/2 TABLETS DAILY 06/19/12   Yehuda Savannah, MD  losartan (COZAAR) 100 MG tablet TAKE ONE TABLET ONCE DAILY 07/16/14   Arnoldo Lenis, MD  omeprazole (PRILOSEC) 20 MG capsule Take 20 mg by mouth 2 (two) times daily.      Historical Provider, MD  potassium chloride (KLOR-CON) 20 MEQ packet Take 20 mEq by mouth 2 (two) times daily.      Historical Provider, MD  simvastatin (ZOCOR) 40 MG tablet Take 40 mg  by mouth at bedtime.      Historical Provider, MD  spironolactone (ALDACTONE) 25 MG tablet Take 25 mg by mouth daily.      Historical Provider, MD   BP 137/86 mmHg  Pulse 73  Resp 23  SpO2 98% Physical Exam  Constitutional: No distress.  HENT:  Head: Normocephalic and atraumatic.  Right Ear: External ear normal.  Left Ear: External ear normal.  Eyes: Conjunctivae are normal. Right eye exhibits no discharge. Left eye exhibits no discharge. No scleral icterus.  Neck: Neck supple. No tracheal deviation present.  Cardiovascular: Normal rate, regular rhythm and intact distal pulses.   Pulmonary/Chest: Accessory muscle usage present. No stridor. Tachypnea noted. No respiratory distress. She has wheezes. She has rales.  Abdominal: Soft. Bowel sounds are normal. She exhibits no distension. There is no tenderness.  There is no rebound and no guarding.  Musculoskeletal: She exhibits no edema or tenderness.  Neurological: She is alert. She has normal strength. No cranial nerve deficit (no facial droop, extraocular movements intact, no slurred speech) or sensory deficit. She exhibits normal muscle tone. She displays no seizure activity. Coordination normal.  Skin: Skin is warm and dry. No rash noted. She is not diaphoretic.  Psychiatric: She has a normal mood and affect.  Nursing note and vitals reviewed.   ED Course  Procedures (including critical care time) Labs Review Labs Reviewed  BASIC METABOLIC PANEL - Abnormal; Notable for the following:    Sodium 128 (*)    Glucose, Bld 224 (*)    GFR calc non Af Amer 89 (*)    All other components within normal limits  TROPONIN I - Abnormal; Notable for the following:    Troponin I 0.06 (*)    All other components within normal limits  BRAIN NATRIURETIC PEPTIDE - Abnormal; Notable for the following:    B Natriuretic Peptide 626.0 (*)    All other components within normal limits  CBC    Imaging Review Dg Chest Port 1 View  07/22/2014   CLINICAL DATA:  Dyspnea of this morning.  Paralyzed weakness.  EXAM: PORTABLE CHEST - 1 VIEW  COMPARISON:  05/19/2014  FINDINGS: There is moderate cardiomegaly, unchanged. There is diffuse interstitial thickening and vascular congestion consistent with congestive heart failure. There are ground-glass central basilar opacities which may represent alveolar edema. Tiny effusions are probably present.  IMPRESSION: Congestive heart failure   Electronically Signed   By: Andreas Newport M.D.   On: 07/22/2014 02:30     EKG Interpretation   Date/Time:  Tuesday July 22 2014 02:09:00 EST Ventricular Rate:  90 PR Interval:  66 QRS Duration: 173 QT Interval:  412 QTC Calculation: 504 R Axis:   -43 Text Interpretation:  Sinus rhythm Short PR interval Probable left atrial  enlargement Left bundle branch block No significant  change since last  tracing Confirmed by Toryn Dewalt  MD-J, Tor Tsuda ([id number]) on 07/22/2014 2:16:51 AM     Medications  nitroGLYCERIN (NITROGLYN) 2 % ointment 1 inch (1 inch Topical Given 07/22/14 0247)  potassium chloride SA (K-DUR,KLOR-CON) CR tablet 40 mEq (not administered)  ipratropium (ATROVENT) 0.02 % nebulizer solution (0.5 mg  Given 07/22/14 0221)  levalbuterol (XOPENEX) 0.63 MG/3ML nebulizer solution (0.63 mg  Given 07/22/14 0223)  furosemide (LASIX) injection 80 mg (80 mg Intravenous Given 07/22/14 0246)    MDM   Final diagnoses:  CHF exacerbation    Bipap continued in the ED for patient comfort.  Labs and xray consistent with CHF.  NTG  and lasix given in the ED.  Will dc bipap.  Admit for further treatment, CHF exacerbation.   Dorie Rank, MD 07/22/14 [phone]

## 2014-07-22 NOTE — H&P (Signed)
PCP:   Alonza Bogus, MD   Chief Complaint:  Shortness of breath  HPI:  74 year old female who  has a past medical history of Cardiomyopathy, nonischemic; Left bundle branch block; Hypertension; Hyperlipidemia; Diabetes mellitus, type II; Cerebrovascular disease; Atrial flutter; and Gastroesophageal reflux disease. Today came to the ED with shortness of breath which started last night. Patient says that she went to bed and then she developed shortness of breath, she was not having any fever no coughing. She denies chest pain. Symptoms worse after she laid flat in the bed. EMS was called and patient was started on Cpap. Her symptoms improved in the ED, she was given 80 mg of IV Lasix 1. She has history of nonischemic are by path he echocardiogram from 2015 revealed grade 1 diastolic dysfunction.  Allergies:   Allergies  Allergen Reactions  . Codeine     REACTION: rapid HB  . Decongestant [Pseudoephedrine Hcl Er]     Heart races      Past Medical History  Diagnosis Date  . Cardiomyopathy, nonischemic     Nl cors in 1989 and 1998; CHF in 12/97; EF of 40% in 5/01 and 7/03, 25% in 9/04 and 4/08.  systolic murmur without valvular abnormalities by echo; refused automatic implantable cardiac defibrillator  . Left bundle branch block   . Hypertension   . Hyperlipidemia   . Diabetes mellitus, type II     No insulin  . Cerebrovascular disease     Duplex study in 12/2006 shows atherosclerosis without focal stenosis  . Atrial flutter     Versus ventricular tachycardia; treated with amiodarone  . Gastroesophageal reflux disease     Past Surgical History  Procedure Laterality Date  . Hernia repair      x2  . Abdominal hysterectomy  2006  . Colonoscopy  2008    Prior to Admission medications   Medication Sig Start Date End Date Taking? Authorizing Provider  amiodarone (PACERONE) 200 MG tablet TAKE 1/2 TABLET ONCE DAILY 10/03/13   Arnoldo Lenis, MD  aspirin 81 MG tablet Take  81 mg by mouth daily.    Historical Provider, MD  calcium gluconate 500 MG tablet Take 500 mg by mouth 2 (two) times daily.      Historical Provider, MD  carvedilol (COREG) 3.125 MG tablet Take 1 tablet (3.125 mg total) by mouth 2 (two) times daily. 06/25/14   Arnoldo Lenis, MD  digoxin (LANOXIN) 0.125 MG tablet TAKE ONE TABLET BY MOUTH EVERY DAY 06/27/13   Arnoldo Lenis, MD  fish oil-omega-3 fatty acids 1000 MG capsule Take 1 capsule by mouth 3 (three) times daily.      Historical Provider, MD  furosemide (LASIX) 40 MG tablet TAKE 1 & 1/2 TABLETS DAILY 06/19/12   Yehuda Savannah, MD  losartan (COZAAR) 100 MG tablet TAKE ONE TABLET ONCE DAILY 07/16/14   Arnoldo Lenis, MD  omeprazole (PRILOSEC) 20 MG capsule Take 20 mg by mouth 2 (two) times daily.      Historical Provider, MD  potassium chloride (KLOR-CON) 20 MEQ packet Take 20 mEq by mouth 2 (two) times daily.      Historical Provider, MD  simvastatin (ZOCOR) 40 MG tablet Take 40 mg by mouth at bedtime.      Historical Provider, MD  spironolactone (ALDACTONE) 25 MG tablet Take 25 mg by mouth daily.      Historical Provider, MD    Social History:  reports that she has never smoked. She  has never used smokeless tobacco. She reports that she does not drink alcohol. Her drug history is not on file.  Family History  Problem Relation Age of Onset  . Diabetes Mother   . Kidney disease Mother   . Diabetes Sister   . Heart attack Brother      All the positives are listed in BOLD  Review of Systems:  HEENT: Headache, blurred vision, runny nose, sore throat Neck: Hypothyroidism, hyperthyroidism,,lymphadenopathy Chest : Shortness of breath, history of COPD, Asthma Heart : Chest pain, history of coronary arterey disease GI:  Nausea, vomiting, diarrhea, constipation, GERD GU: Dysuria, urgency, frequency of urination, hematuria Neuro: Stroke, seizures, syncope Psych: Depression, anxiety, hallucinations   Physical Exam: Blood  pressure 128/70, pulse 66, resp. rate 22, SpO2 98 %. Constitutional:   Patient is a frail elderly-appearing female in no acute distress Head: Normocephalic and atraumatic Mouth: Mucus membranes moist Eyes: PERRL, EOMI, conjunctivae normal Neck: Supple, No Thyromegaly Cardiovascular: RRR, S1 normal, S2 normal Pulmonary/Chest: Bilateral crackles Abdominal: Soft. Non-tender, non-distended, bowel sounds are normal, no masses, organomegaly, or guarding present.  Neurological: A&O x3, Strength is normal and symmetric bilaterally, cranial nerve II-XII are grossly intact, no focal motor deficit, sensory intact to light touch bilaterally.  Extremities : No Cyanosis, Clubbing or Edema  Labs on Admission:  Basic Metabolic Panel:  Recent Labs Lab 07/22/14 0230  NA 128*  K 3.8  CL 97  CO2 25  GLUCOSE 224*  BUN 17  CREATININE 0.58  CALCIUM 8.6   Liver Function Tests: No results for input(s): AST, ALT, ALKPHOS, BILITOT, PROT, ALBUMIN in the last 168 hours. No results for input(s): LIPASE, AMYLASE in the last 168 hours. No results for input(s): AMMONIA in the last 168 hours. CBC:  Recent Labs Lab 07/22/14 0230  WBC 10.5  HGB 13.1  HCT 38.8  MCV 94.4  PLT 180   Cardiac Enzymes:  Recent Labs Lab 07/22/14 0230  TROPONINI 0.06*    BNP (last 3 results)  Recent Labs  07/22/14 0230  BNP 626.0*    ProBNP (last 3 results)  Recent Labs  05/19/14 0304  PROBNP 1012.0*    CBG: No results for input(s): GLUCAP in the last 168 hours.  Radiological Exams on Admission: Dg Chest Port 1 View  07/22/2014   CLINICAL DATA:  Dyspnea of this morning.  Paralyzed weakness.  EXAM: PORTABLE CHEST - 1 VIEW  COMPARISON:  05/19/2014  FINDINGS: There is moderate cardiomegaly, unchanged. There is diffuse interstitial thickening and vascular congestion consistent with congestive heart failure. There are ground-glass central basilar opacities which may represent alveolar edema. Tiny effusions are  probably present.  IMPRESSION: Congestive heart failure   Electronically Signed   By: Andreas Newport M.D.   On: 07/22/2014 02:30    EKG: Independently reviewed. *Sinus rhythm  Assessment/Plan Active Problems:   Hyperlipidemia   Hypertension   Diabetes mellitus, type II   Acute exacerbation of CHF (congestive heart failure)  Acute diastolic heart failure Patient's echocardiogram from 2015 showed grade 1 diastolic dysfunction, she has received 80 mg of IV Lasix in the ED. We'll start Lasix 20 g IV every 12 hours Will give first dose at noon time today. Will also consult cardiology.  Elevated troponin Patient has mild elevation of troponin 0.04, likely from demand ischemia from CHF exacerbation. Will cycle troponin 3. EKG shows normal sinus rhythm.  Hypertension Continue Cozaar, Aldactone, Coreg. BP is controlled.  Diabetes mellitus We'll start sliding scale insulin with NovoLog.  History  of atrial flutter Continue digoxin, amiodarone. Patient is on aspirin 81 mg by mouth daily.  DVT prophylaxis Lovenox  Code status: DNR  Family discussion: Admission, patients condition and plan of care including tests being ordered have been discussed with the patient and *her son at bedside* who indicate understanding and agree with the plan and Code Status.   Time Spent on Admission: *55 minutes  [facility] Pager: [phone] 07/22/2014, 4:07 AM  If 7PM-7AM, please contact night-coverage  [email or web address]  Password [email or web address]

## 2014-07-22 NOTE — Progress Notes (Signed)
Patient taken off BIPAP and placed on nasal cannula at 3 lpm per MD.

## 2014-07-23 ENCOUNTER — Encounter (HOSPITAL_COMMUNITY): Payer: Self-pay | Admitting: Physician Assistant

## 2014-07-23 DIAGNOSIS — I429 Cardiomyopathy, unspecified: Secondary | ICD-10-CM

## 2014-07-23 DIAGNOSIS — I340 Nonrheumatic mitral (valve) insufficiency: Secondary | ICD-10-CM

## 2014-07-23 DIAGNOSIS — I5023 Acute on chronic systolic (congestive) heart failure: Principal | ICD-10-CM

## 2014-07-23 DIAGNOSIS — I248 Other forms of acute ischemic heart disease: Secondary | ICD-10-CM

## 2014-07-23 DIAGNOSIS — I519 Heart disease, unspecified: Secondary | ICD-10-CM

## 2014-07-23 DIAGNOSIS — I472 Ventricular tachycardia: Secondary | ICD-10-CM

## 2014-07-23 DIAGNOSIS — I447 Left bundle-branch block, unspecified: Secondary | ICD-10-CM

## 2014-07-23 DIAGNOSIS — R42 Dizziness and giddiness: Secondary | ICD-10-CM

## 2014-07-23 LAB — COMPREHENSIVE METABOLIC PANEL
ALK PHOS: 44 U/L (ref 39–117)
ALT: 20 U/L (ref 0–35)
AST: 20 U/L (ref 0–37)
Albumin: 3.9 g/dL (ref 3.5–5.2)
Anion gap: 9 (ref 5–15)
BILIRUBIN TOTAL: 0.7 mg/dL (ref 0.3–1.2)
BUN: 17 mg/dL (ref 6–23)
CALCIUM: 9.2 mg/dL (ref 8.4–10.5)
CO2: 28 mmol/L (ref 19–32)
Chloride: 96 mmol/L (ref 96–112)
Creatinine, Ser: 0.55 mg/dL (ref 0.50–1.10)
GFR calc non Af Amer: >90 mL/min (ref >90)
Glucose, Bld: 144 mg/dL — ABNORMAL HIGH (ref 70–99)
Potassium: 4 mmol/L (ref 3.5–5.1)
Sodium: 133 mmol/L — ABNORMAL LOW (ref 135–145)
TOTAL PROTEIN: 6.5 g/dL (ref 6.0–8.3)

## 2014-07-23 LAB — CBC
HEMATOCRIT: 37 % (ref 36.0–46.0)
HEMOGLOBIN: 12.4 g/dL (ref 12.0–15.0)
MCH: 32 pg (ref 26.0–34.0)
MCHC: 33.5 g/dL (ref 30.0–36.0)
MCV: 95.4 fL (ref 78.0–100.0)
Platelets: 152 10*3/uL (ref 150–400)
RBC: 3.88 MIL/uL (ref 3.87–5.11)
RDW: 13.2 % (ref 11.5–15.5)
WBC: 9.3 10*3/uL (ref 4.0–10.5)

## 2014-07-23 LAB — HEMOGLOBIN A1C
Hgb A1c MFr Bld: 7.2 % — ABNORMAL HIGH (ref 4.8–5.6)
Mean Plasma Glucose: 160 mg/dL

## 2014-07-23 LAB — GLUCOSE, CAPILLARY
Glucose-Capillary: 146 mg/dL — ABNORMAL HIGH (ref 70–99)
Glucose-Capillary: 149 mg/dL — ABNORMAL HIGH (ref 70–99)
Glucose-Capillary: 141 mg/dL — ABNORMAL HIGH (ref 70–99)
Glucose-Capillary: 184 mg/dL — ABNORMAL HIGH (ref 70–99)

## 2014-07-23 MED ORDER — FUROSEMIDE 20 MG PO TABS
20.0000 mg | ORAL_TABLET | Freq: Once | ORAL | Status: AC
  Administered 2014-07-23: 20 mg via ORAL

## 2014-07-23 MED ORDER — FUROSEMIDE 40 MG PO TABS
40.0000 mg | ORAL_TABLET | Freq: Every day | ORAL | Status: DC
  Administered 2014-07-24: 40 mg via ORAL
  Filled 2014-07-23 (×2): qty 1

## 2014-07-23 MED ORDER — FUROSEMIDE 20 MG PO TABS
20.0000 mg | ORAL_TABLET | Freq: Every day | ORAL | Status: DC
  Administered 2014-07-23: 20 mg via ORAL
  Filled 2014-07-23: qty 1

## 2014-07-23 MED ORDER — FUROSEMIDE 20 MG PO TABS: 20.0000 mg | ORAL_TABLET | Freq: Two times a day (BID) | ORAL | Status: DC

## 2014-07-23 NOTE — Care Management Note (Addendum)
    Page 1 of 1   07/24/2014     11:20:45 AM CARE MANAGEMENT NOTE 07/24/2014  Patient:  Donna [name], Donna [name]   Account Number:  [id number]  Date Initiated:  07/23/2014  Documentation initiated by:  Theophilus Kinds  Subjective/Objective Assessment:   Pt admitted from home with CHF. Pt lives with her children and will return home at discharge. Pt is independent with ADL's.     Action/Plan:   [facility] referral made. Pt denies any need for HH needs at this time.   Anticipated DC Date:  07/24/2014   Anticipated DC Plan:  Donna [name]  CM consult      Choice offered to / List presented to:             Status of service:  Completed, signed off Medicare Important Message given?  NA - LOS <3 / Initial given by admissions (If response is "NO", the following Medicare IM given date fields will be blank) Date Medicare IM given:   Medicare IM given by:   Date Additional Medicare IM given:   Additional Medicare IM given by:    Discharge Disposition:  HOME/SELF CARE  Per UR Regulation:    If discussed at Long Length of Stay Meetings, dates discussed:    Comments:  07/24/14 [place], RN BSN CM Pt discharged home today. No Cm needs noted. Pt did not qualify for home O2.  07/23/14 [place], RN BSN CM

## 2014-07-23 NOTE — Progress Notes (Signed)
UR chart review completed.  

## 2014-07-23 NOTE — Progress Notes (Signed)
Cardiology Consultation Note  Patient ID: KEIMANI LAUFER, MRN: [id number], DOB/AGE: 09-14-1940 74 y.o. Admit date: 07/22/2014   Date of Consult: 07/23/2014 Primary Physician: Alonza Bogus, MD Primary Cardiologist: Branch  Chief Complaint: SOB Reason for Consultation: CHF  HPI: Ms. Azizi is a 74 y/o F with decades-long history of chronic systolic CHF (NICM with reported normal cors 1989, 1999), chronic LBBB, HTN, HLD, DM, and chronic amiodarone use for ?VT who presented to [facility] with SOB and a/c CHF. In her chart [facility] it lists "atrial flutter versus ventricular tachycardia treated with amiodarone" - details of this are unclear. Notes from back to 2006 indicate she has been maintained on amiodarone but do not describe further indication. I do not see any hospital admissions for either of these diagnoses. Dr. Nelly Laurence chart indicates the amiodarone is for history of VT. This is the more likely explanation as the patient's sister says a while back the patient had skipped beats from her heart that Dr. Lattie Haw told her could cause her to drop dead - he put her on amiodarone and recommended a defibrillator. She previously declined but states that it is something she would now consider as her family has told her it may help her.  She has done remarkably well with minimal problems due to her cardiomyopathy. She usually walks 2 miles a day with her sister but has had intermittent dizzy spells lately keeping her from doing so. She had not had any recent anginal symptoms while exercising. They occur without provocative factor and last up to 10-15 minutes at a time. No associated symptoms. She's had them while driving before so she no longer drives. She has not had any presyncope or syncope. On 07/21/14 she went to bed as usual but overnight developed increasing SOB and orthopnea. Symptoms became worse so she called EMS and was started on CPAP. She was given 80mg  IV Lasix in the ER, then 20mg  BID yesterday and  this morning significant improvement in symptoms. She currently feels back to baseline. I/O don't appear to be fully recorded but she appears euvolemic. She denies any recent CP, LEE, bleeding, coughing, fever, chills or weight change. She is compliant with salt/fluid recommendations. BNP 626. CXR with CHF. Na 128->133 with diuresis. Troponin trend abnormal but flat 0.06-0.07-0.10-0.07. Has not had any dizzy spells while in the hospital. HR mid-upper 40s-60s in telemetry.  2D Echo 07/22/14: EF 20%, diffuse HK, severely dilated LV, mild-mod MR, small pericardial effusion with prominant epicardial fat pad ? ascites seen on subcostal images  Past Medical History  Diagnosis Date  . Cardiomyopathy, nonischemic     Nl cors in 1989 and 1998; CHF in 12/97; EF of 40% in 5/01 and 7/03, 25% in 9/04 and 4/08.  systolic murmur without valvular abnormalities by echo; refused automatic implantable cardiac defibrillator  . Left bundle branch block   . Hypertension   . Hyperlipidemia   . Diabetes mellitus, type II     No insulin  . Cerebrovascular disease     Duplex study in 12/2006 shows atherosclerosis without focal stenosis  . Atrial flutter     Versus ventricular tachycardia; treated with amiodarone  . Gastroesophageal reflux disease   . Ventricular tachycardia       Most Recent Cardiac Studies: 2D Echo 07/22/14 - Left ventricle: The cavity size was severely dilated. Wall thickness was normal. The estimated ejection fraction was 20%. Diffuse hypokinesis. - Mitral valve: There was mild to moderate regurgitation. - Left atrium: The atrium was mildly  dilated. - Atrial septum: No defect or patent foramen ovale was identified. - Pericardium, extracardiac: Small pericardial effusion with prominant epicardial fat pad ? ascites seen on subcostal images  Stress test 2002: IMPRESSION ABNORMAL PHARMACOLOGIC STRESS CARDIOLITE STUDY WITH IMPRESSIVE LEFT VENTRICULAR DILATATION, MODERATE IMPAIRMENT IN LEFT  VENTRICULAR SYSTOLIC FUNCTION IN A SEGMENTAL PATTERN AND SCINTIGRAPHIC EVIDENCE FOR SCARRING IN THE ANTEROSEPTAL REGION AND APEX.   Surgical History:  Past Surgical History  Procedure Laterality Date  . Hernia repair      x2  . Abdominal hysterectomy  2006  . Colonoscopy  2008     Home Meds: Prior to Admission medications   Medication Sig Start Date End Date Taking? Authorizing Provider  amiodarone (PACERONE) 200 MG tablet Take 100 mg by mouth daily.   Yes Historical Provider, MD  aspirin 81 MG tablet Take 81 mg by mouth daily.   Yes Historical Provider, MD  calcium gluconate 500 MG tablet Take 500 mg by mouth 2 (two) times daily.     Yes Historical Provider, MD  carvedilol (COREG) 3.125 MG tablet Take 1 tablet (3.125 mg total) by mouth 2 (two) times daily. 06/25/14  Yes Arnoldo Lenis, MD  digoxin (LANOXIN) 0.125 MG tablet TAKE ONE TABLET BY MOUTH EVERY DAY Patient taking differently: Take 0.125 mg by mouth daily.  06/27/13  Yes Arnoldo Lenis, MD  fish oil-omega-3 fatty acids 1000 MG capsule Take 1 capsule by mouth 3 (three) times daily.     Yes Historical Provider, MD  furosemide (LASIX) 40 MG tablet Take 20-40 mg by mouth 2 (two) times daily. Pt takes 1 tablet in the morning and 0.5 tablet at supper.   Yes Historical Provider, MD  losartan (COZAAR) 100 MG tablet Take 100 mg by mouth daily.   Yes Historical Provider, MD  omeprazole (PRILOSEC) 20 MG capsule Take 20 mg by mouth 2 (two) times daily.     Yes Historical Provider, MD  potassium chloride (KLOR-CON) 20 MEQ packet Take 20 mEq by mouth 2 (two) times daily.     Yes Historical Provider, MD  simvastatin (ZOCOR) 40 MG tablet Take 40 mg by mouth at bedtime.     Yes Historical Provider, MD  spironolactone (ALDACTONE) 25 MG tablet Take 25 mg by mouth daily.     Yes Historical Provider, MD    Inpatient Medications:  . amiodarone  100 mg Oral Daily  . antiseptic oral rinse  7 mL Mouth Rinse q12n4p  . aspirin EC  81 mg Oral Daily   . atorvastatin  20 mg Oral q1800  . calcium carbonate  1 tablet Oral BID  . carvedilol  3.125 mg Oral BID  . chlorhexidine  15 mL Mouth Rinse BID  . digoxin  0.125 mg Oral Daily  . enoxaparin (LOVENOX) injection  40 mg Subcutaneous Q24H  . furosemide  20 mg Intravenous BID  . insulin aspart  0-9 Units Subcutaneous TID WC  . losartan  100 mg Oral Daily  . nitroGLYCERIN  1 inch Topical 4 times per day  . potassium chloride  20 mEq Oral BID  . sodium chloride  3 mL Intravenous Q12H  . sodium chloride  3 mL Intravenous Q12H  . spironolactone  25 mg Oral Daily      Allergies:  Allergies  Allergen Reactions  . Codeine Other (See Comments)    Reaction:  Racing heart   . Decongestant [Pseudoephedrine Hcl Er] Other (See Comments)    Reaction:  Racing heart  History   Social History  . Marital Status: Widowed    Spouse Name: N/A  . Number of Children: N/A  . Years of Education: N/A   Occupational History  . Retired    Social History Main Topics  . Smoking status: Never Smoker   . Smokeless tobacco: Never Used  . Alcohol Use: No  . Drug Use: Not on file  . Sexual Activity: Not on file   Other Topics Concern  . Not on file   Social History Narrative   Lives in [place] with son and grandson   Physically active     Family History  Problem Relation Age of Onset  . Diabetes Mother   . Kidney disease Mother   . Diabetes Sister   . Heart attack Brother      Review of Systems: General: negative for chills, fever, night sweats or weight changes.  Cardiovascular: see above Dermatological: negative for rash Respiratory: negative for cough or wheezing Urologic: negative for hematuria Abdominal: negative for nausea, vomiting, diarrhea, bright red blood per rectum, melena, or hematemesis Neurologic: negative for visual changes, syncope All other systems reviewed and are otherwise negative except as noted above.  Labs:  Recent Labs  07/22/14 0230 07/22/14 0546  07/22/14 1059 07/22/14 1641  TROPONINI 0.06* 0.07* 0.10* 0.07*   Lab Results  Component Value Date   WBC 9.3 07/23/2014   HGB 12.4 07/23/2014   HCT 37.0 07/23/2014   MCV 95.4 07/23/2014   PLT 152 07/23/2014    Recent Labs Lab 07/23/14 0601  NA 133*  K 4.0  CL 96  CO2 28  BUN 17  CREATININE 0.55  CALCIUM 9.2  PROT 6.5  BILITOT 0.7  ALKPHOS 44  ALT 20  AST 20  GLUCOSE 144*   Lab Results  Component Value Date   CHOL 119 04/02/2009   HDL 34 04/02/2009   LDLCALC 51 04/02/2009   TRIG 172 04/02/2009   Radiology/Studies:  Dg Chest Port 1 View  07/22/2014   CLINICAL DATA:  Dyspnea of this morning.  Paralyzed weakness.  EXAM: PORTABLE CHEST - 1 VIEW  COMPARISON:  05/19/2014  FINDINGS: There is moderate cardiomegaly, unchanged. There is diffuse interstitial thickening and vascular congestion consistent with congestive heart failure. There are ground-glass central basilar opacities which may represent alveolar edema. Tiny effusions are probably present.  IMPRESSION: Congestive heart failure   Electronically Signed   By: Andreas Newport M.D.   On: 07/22/2014 02:30    Wt Readings from Last 3 Encounters:  07/23/14 110 lb 0.2 oz (49.9 kg)  06/25/14 109 lb (49.442 kg)  05/19/14 111 lb (50.349 kg)   EKG: NSR 90bpm with LBBB and nonspecific ST-T changes - no sig change from prior  Physical Exam: Blood pressure 132/63, pulse 60, temperature 97.4 F (36.3 C), temperature source Oral, resp. rate 17, height 5\' 1"  (1.549 m), weight 110 lb 0.2 oz (49.9 kg), SpO2 98 %. General: Well developed, well nourished WF, in no acute distress. Head: Normocephalic, atraumatic, sclera non-icteric, no xanthomas, nares are without discharge.  Neck: Negative for carotid bruits. JVD not elevated. Lungs: Clear bilaterally to auscultation without wheezes, rales, or rhonchi. Breathing is unlabored. Heart: RRR with S1, split S2. 2/6 SEM heard best at apex. No rubs or gallops appreciated. Abdomen: Soft,  non-tender, non-distended with normoactive bowel sounds. No hepatomegaly. No rebound/guarding. No obvious abdominal masses. Msk:  Strength and tone appear normal for age. Extremities: No clubbing or cyanosis. No edema.  Distal pedal pulses  are 2+ and equal bilaterally. Neuro: Alert and oriented X 3. No facial asymmetry. No focal deficit. Moves all extremities spontaneously. Psych:  Responds to questions appropriately with a normal affect.    Assessment and Plan:   1. Acute on chronic systolic CHF EF 56% - she appears adequately diuresed. Will change her back to home dose of Lasix. Continue BB, ARB, spironolactone. I reintroduced the idea of an ICD to her (+/- CRT given LBBB) and she now seems more open to the idea. She will discuss further with family and let us know her final thoughts. I also clarified her code status - she was initially listed as a DNR and I told her if this is the case then a defibrillator does not make sense. However, she states that it's not that she doesn't want to be resuscitated, it's that she doesn't want to be left on life support if there is no meaningful outcome. I told her unfortunately at the time of an arrest there is no way to know the ultimate outcome. Per our extensive discussion with her sister present, I have changed her to a full code in accordance with her wishes. She has generally otherwise good functional status thus I feel this is appropriate at this time. I will discuss the ICD issue with Dr. Bronson Ing. 2. Elevated troponin - will discuss further w/u with MD. No recent ischemic eval but h/o normal cors in 1998. May be due to demand ischemia in the setting of CHF. 3. Sinus bradycardia - consider discontinuation of digoxin. Ideally would want to continue some BB given reported h/o VT and ?EF. 4. Dizzy spells - etiology unclear, question inner ear versus arrhythmia. Does not sound orthostatic as it's happened while seated as well. With symptoms lasting 10-15  minutes I am less inclined to think this is recurrent VT but an event monitor may be helpful. She does have sinus bradycardia on telemetry but has been asymptomatic while in the hospital. Will d/w MD. 5. Mild-mod mitral regurgitation - follow. 6. Small pericardial effusion - will likely just follow clinically for now. She is asymptomatic with this. 7. H/o VT - continue current regimen for now.   Signed, Melina Copa PA-C 07/23/2014, 10:24 AM   The patient was seen and examined, and I agree with the assessment and plan as documented above, with modifications as noted below. 74 yr old woman (patient of Dr. Harl Bowie, who evaluated her in 1/16 and reduced her Coreg dose to see if this would alleviate dizziness) admitted with acute on chronic systolic heart failure in the context of a severe nonischemic cardiomyopathy, EF 20%. Also maintained on amiodarone for h/o ventricular tachycardia. Has refused ICD in the past, first introduced to her by Dr. Lattie Haw.  She has been adequately diuresed and appears euvolemic and is now on home dose of diuretics. Dayna Dunn PA-C clarified her code status as "FULL" and I went over the details with her as well. She has been having episodic dizziness both at rest and with exertion. I will d/c digoxin to allow her HR to increase. Minimally elevated troponin consistent with demand ischemia in the context of acute on chronic systolic heart failure, given history of diabetes mellitus with probable small vessel disease. No need to proceed with stress testing. ECG demonstrates sinus rhythm with a left bundle branch block, QRS duration 173 ms. As she is on guideline-directed medical therapy and has NYHA class III-IV symptoms, she meets criteria for cardiac resynchronization therapy (CRT-D) which will help attenuate  probability of further readmissions for heart failure. I discussed this idea with her and she is now very seriously considering it. She has an appointment this  Thursday in our clinic and she may have made a final decision by then. Continue ASA, Lipitor, Coreg, losartan, and spironolactone. She is stable for discharge from my standpoint.

## 2014-07-23 NOTE — Consult Note (Signed)
See Progress Note from today which serves as the patient's Consult Note. I erroneously filed it under Progress Notes. Carolos Fecher PA-C

## 2014-07-23 NOTE — Progress Notes (Signed)
Subjective: She was admitted with PND and congestive heart failure. Her echocardiogram shows ejection fraction of about 20%. She has chronic dizziness and had her last cardiology visit she had some of her medications reduced in an attempt to help with her chronic dizziness. She's had previous similar episodes with PND. She did not have any shortness of breath last night Objective: Vital signs in last 24 hours: Temp:  [97.4 F (36.3 C)-97.9 F (36.6 C)] 97.4 F (36.3 C) (02/17 0547) Pulse Rate:  [49-58] 58 (02/17 0547) Resp:  [16-20] 17 (02/17 0547) BP: (99-117)/(42-55) 117/55 mmHg (02/17 0547) SpO2:  [96 %-98 %] 98 % (02/17 0547) Weight:  [49.9 kg (110 lb 0.2 oz)] 49.9 kg (110 lb 0.2 oz) (02/17 0547) Weight change: 0.5 kg (1 lb 1.6 oz) Last BM Date: 07/22/14  Intake/Output from previous day: 02/16 0701 - 02/17 0700 In: 720 [P.O.:720] Out: -   PHYSICAL EXAM General appearance: alert, cooperative and no distress Resp: clear to auscultation bilaterally Cardio: regular rate and rhythm, S1, S2 normal, no murmur, click, rub or gallop GI: soft, non-tender; bowel sounds normal; no masses,  no organomegaly Extremities: extremities normal, atraumatic, no cyanosis or edema  Lab Results:  Results for orders placed or performed during the hospital encounter of 07/22/14 (from the past 48 hour(s))  Basic metabolic panel     Status: Abnormal   Collection Time: 07/22/14  2:30 AM  Result Value Ref Range   Sodium 128 (L) 135 - 145 mmol/L   Potassium 3.8 3.5 - 5.1 mmol/L   Chloride 97 96 - 112 mmol/L   CO2 25 19 - 32 mmol/L   Glucose, Bld 224 (H) 70 - 99 mg/dL   BUN 17 6 - 23 mg/dL   Creatinine, Ser 0.58 0.50 - 1.10 mg/dL   Calcium 8.6 8.4 - 10.5 mg/dL   GFR calc non Af Amer 89 (L) >90 mL/min   GFR calc Af Amer >90 >90 mL/min    Comment: (NOTE) The eGFR has been calculated using the CKD EPI equation. This calculation has not been validated in all clinical situations. eGFR's persistently  <90 mL/min signify possible Chronic Kidney Disease.    Anion gap 6 5 - 15  CBC     Status: None   Collection Time: 07/22/14  2:30 AM  Result Value Ref Range   WBC 10.5 4.0 - 10.5 K/uL   RBC 4.11 3.87 - 5.11 MIL/uL   Hemoglobin 13.1 12.0 - 15.0 g/dL   HCT 38.8 36.0 - 46.0 %   MCV 94.4 78.0 - 100.0 fL   MCH 31.9 26.0 - 34.0 pg   MCHC 33.8 30.0 - 36.0 g/dL   RDW 13.1 11.5 - 15.5 %   Platelets 180 150 - 400 K/uL  Troponin I (MHP)     Status: Abnormal   Collection Time: 07/22/14  2:30 AM  Result Value Ref Range   Troponin I 0.06 (H) <0.031 ng/mL    Comment:        PERSISTENTLY INCREASED TROPONIN VALUES IN THE RANGE OF 0.04-0.49 ng/mL CAN BE SEEN IN:       -UNSTABLE ANGINA       -CONGESTIVE HEART FAILURE       -MYOCARDITIS       -CHEST TRAUMA       -ARRYHTHMIAS       -LATE PRESENTING MYOCARDIAL INFARCTION       -COPD   CLINICAL FOLLOW-UP RECOMMENDED.   Brain natriuretic peptide     Status: Abnormal  Collection Time: 07/22/14  2:30 AM  Result Value Ref Range   B Natriuretic Peptide 626.0 (H) 0.0 - 100.0 pg/mL  Hemoglobin A1c     Status: Abnormal   Collection Time: 07/22/14  5:46 AM  Result Value Ref Range   Hgb A1c MFr Bld 7.2 (H) 4.8 - 5.6 %    Comment: (NOTE)         Pre-diabetes: 5.7 - 6.4         Diabetes: >6.4         Glycemic control for adults with diabetes: <7.0    Mean Plasma Glucose 160 mg/dL    Comment: (NOTE) Performed At: [facility] [place], [place] [id number] Lindon Romp MD QP:[id number]   CBC     Status: None   Collection Time: 07/22/14  5:46 AM  Result Value Ref Range   WBC 10.3 4.0 - 10.5 K/uL   RBC 4.12 3.87 - 5.11 MIL/uL   Hemoglobin 13.1 12.0 - 15.0 g/dL   HCT 38.9 36.0 - 46.0 %   MCV 94.4 78.0 - 100.0 fL   MCH 31.8 26.0 - 34.0 pg   MCHC 33.7 30.0 - 36.0 g/dL   RDW 13.1 11.5 - 15.5 %   Platelets 164 150 - 400 K/uL  Creatinine, serum     Status: Abnormal   Collection Time: 07/22/14  5:46 AM  Result Value Ref  Range   Creatinine, Ser 0.60 0.50 - 1.10 mg/dL   GFR calc non Af Amer 88 (L) >90 mL/min   GFR calc Af Amer >90 >90 mL/min    Comment: (NOTE) The eGFR has been calculated using the CKD EPI equation. This calculation has not been validated in all clinical situations. eGFR's persistently <90 mL/min signify possible Chronic Kidney Disease.   Troponin I     Status: Abnormal   Collection Time: 07/22/14  5:46 AM  Result Value Ref Range   Troponin I 0.07 (H) <0.031 ng/mL    Comment:        PERSISTENTLY INCREASED TROPONIN VALUES IN THE RANGE OF 0.04-0.49 ng/mL CAN BE SEEN IN:       -UNSTABLE ANGINA       -CONGESTIVE HEART FAILURE       -MYOCARDITIS       -CHEST TRAUMA       -ARRYHTHMIAS       -LATE PRESENTING MYOCARDIAL INFARCTION       -COPD   CLINICAL FOLLOW-UP RECOMMENDED.   Glucose, capillary     Status: Abnormal   Collection Time: 07/22/14  9:44 AM  Result Value Ref Range   Glucose-Capillary 130 (H) 70 - 99 mg/dL  Troponin I     Status: Abnormal   Collection Time: 07/22/14 10:59 AM  Result Value Ref Range   Troponin I 0.10 (H) <0.031 ng/mL    Comment:        PERSISTENTLY INCREASED TROPONIN VALUES IN THE RANGE OF 0.04-0.49 ng/mL CAN BE SEEN IN:       -UNSTABLE ANGINA       -CONGESTIVE HEART FAILURE       -MYOCARDITIS       -CHEST TRAUMA       -ARRYHTHMIAS       -LATE PRESENTING MYOCARDIAL INFARCTION       -COPD   CLINICAL FOLLOW-UP RECOMMENDED.   Glucose, capillary     Status: Abnormal   Collection Time: 07/22/14 11:58 AM  Result Value Ref Range   Glucose-Capillary 222 (H) 70 -  99 mg/dL  Troponin I     Status: Abnormal   Collection Time: 07/22/14  4:41 PM  Result Value Ref Range   Troponin I 0.07 (H) <0.031 ng/mL    Comment:        PERSISTENTLY INCREASED TROPONIN VALUES IN THE RANGE OF 0.04-0.49 ng/mL CAN BE SEEN IN:       -UNSTABLE ANGINA       -CONGESTIVE HEART FAILURE       -MYOCARDITIS       -CHEST TRAUMA       -ARRYHTHMIAS       -LATE PRESENTING  MYOCARDIAL INFARCTION       -COPD   CLINICAL FOLLOW-UP RECOMMENDED.   Glucose, capillary     Status: Abnormal   Collection Time: 07/22/14  4:50 PM  Result Value Ref Range   Glucose-Capillary 149 (H) 70 - 99 mg/dL   Comment 1 Notify RN    Comment 2 Document in Chart   Glucose, capillary     Status: Abnormal   Collection Time: 07/22/14  9:35 PM  Result Value Ref Range   Glucose-Capillary 159 (H) 70 - 99 mg/dL   Comment 1 Notify RN    Comment 2 Document in Chart   CBC     Status: None   Collection Time: 07/23/14  6:01 AM  Result Value Ref Range   WBC 9.3 4.0 - 10.5 K/uL   RBC 3.88 3.87 - 5.11 MIL/uL   Hemoglobin 12.4 12.0 - 15.0 g/dL   HCT 37.0 36.0 - 46.0 %   MCV 95.4 78.0 - 100.0 fL   MCH 32.0 26.0 - 34.0 pg   MCHC 33.5 30.0 - 36.0 g/dL   RDW 13.2 11.5 - 15.5 %   Platelets 152 150 - 400 K/uL  Comprehensive metabolic panel     Status: Abnormal   Collection Time: 07/23/14  6:01 AM  Result Value Ref Range   Sodium 133 (L) 135 - 145 mmol/L   Potassium 4.0 3.5 - 5.1 mmol/L   Chloride 96 96 - 112 mmol/L   CO2 28 19 - 32 mmol/L   Glucose, Bld 144 (H) 70 - 99 mg/dL   BUN 17 6 - 23 mg/dL   Creatinine, Ser 0.55 0.50 - 1.10 mg/dL   Calcium 9.2 8.4 - 10.5 mg/dL   Total Protein 6.5 6.0 - 8.3 g/dL   Albumin 3.9 3.5 - 5.2 g/dL   AST 20 0 - 37 U/L   ALT 20 0 - 35 U/L   Alkaline Phosphatase 44 39 - 117 U/L   Total Bilirubin 0.7 0.3 - 1.2 mg/dL   GFR calc non Af Amer >90 >90 mL/min   GFR calc Af Amer >90 >90 mL/min    Comment: (NOTE) The eGFR has been calculated using the CKD EPI equation. This calculation has not been validated in all clinical situations. eGFR's persistently <90 mL/min signify possible Chronic Kidney Disease.    Anion gap 9 5 - 15  Glucose, capillary     Status: Abnormal   Collection Time: 07/23/14  7:42 AM  Result Value Ref Range   Glucose-Capillary 146 (H) 70 - 99 mg/dL   Comment 1 Notify RN     ABGS No results for input(s): PHART, PO2ART, TCO2, HCO3 in  the last 72 hours.  Invalid input(s): PCO2 CULTURES No results found for this or any previous visit (from the past 240 hour(s)). Studies/Results: Dg Chest Port 1 View  07/22/2014   CLINICAL DATA:  Dyspnea of this  morning.  Paralyzed weakness.  EXAM: PORTABLE CHEST - 1 VIEW  COMPARISON:  05/19/2014  FINDINGS: There is moderate cardiomegaly, unchanged. There is diffuse interstitial thickening and vascular congestion consistent with congestive heart failure. There are ground-glass central basilar opacities which may represent alveolar edema. Tiny effusions are probably present.  IMPRESSION: Congestive heart failure   Electronically Signed   By: Andreas Newport M.D.   On: 07/22/2014 02:30    Medications:  Prior to Admission:  Prescriptions prior to admission  Medication Sig Dispense Refill Last Dose  . amiodarone (PACERONE) 200 MG tablet Take 100 mg by mouth daily.   07/21/2014 at Unknown time  . aspirin 81 MG tablet Take 81 mg by mouth daily.   07/21/2014 at Unknown time  . calcium gluconate 500 MG tablet Take 500 mg by mouth 2 (two) times daily.     07/21/2014 at Unknown time  . carvedilol (COREG) 3.125 MG tablet Take 1 tablet (3.125 mg total) by mouth 2 (two) times daily. 60 tablet 6 07/21/2014 at 1800  . digoxin (LANOXIN) 0.125 MG tablet TAKE ONE TABLET BY MOUTH EVERY DAY (Patient taking differently: Take 0.125 mg by mouth daily. ) 30 tablet 6 07/21/2014 at Unknown time  . fish oil-omega-3 fatty acids 1000 MG capsule Take 1 capsule by mouth 3 (three) times daily.     07/21/2014 at Unknown time  . furosemide (LASIX) 40 MG tablet Take 20-40 mg by mouth 2 (two) times daily. Pt takes 1 tablet in the morning and 0.5 tablet at supper.   07/21/2014 at Unknown time  . losartan (COZAAR) 100 MG tablet Take 100 mg by mouth daily.   07/21/2014 at Unknown time  . omeprazole (PRILOSEC) 20 MG capsule Take 20 mg by mouth 2 (two) times daily.     07/21/2014 at Unknown time  . potassium chloride (KLOR-CON) 20 MEQ  packet Take 20 mEq by mouth 2 (two) times daily.     07/21/2014 at Unknown time  . simvastatin (ZOCOR) 40 MG tablet Take 40 mg by mouth at bedtime.     07/21/2014 at Unknown time  . spironolactone (ALDACTONE) 25 MG tablet Take 25 mg by mouth daily.     07/21/2014 at Unknown time   Scheduled: . amiodarone  100 mg Oral Daily  . antiseptic oral rinse  7 mL Mouth Rinse q12n4p  . aspirin EC  81 mg Oral Daily  . atorvastatin  20 mg Oral q1800  . calcium carbonate  1 tablet Oral BID  . carvedilol  3.125 mg Oral BID  . chlorhexidine  15 mL Mouth Rinse BID  . digoxin  0.125 mg Oral Daily  . enoxaparin (LOVENOX) injection  40 mg Subcutaneous Q24H  . furosemide  20 mg Intravenous BID  . insulin aspart  0-9 Units Subcutaneous TID WC  . losartan  100 mg Oral Daily  . nitroGLYCERIN  1 inch Topical 4 times per day  . potassium chloride  20 mEq Oral BID  . sodium chloride  3 mL Intravenous Q12H  . sodium chloride  3 mL Intravenous Q12H  . spironolactone  25 mg Oral Daily   Continuous:  IPJ:ASNKNL chloride, acetaminophen **OR** acetaminophen, ondansetron **OR** ondansetron (ZOFRAN) IV, sodium chloride  Assesment: She is admitted with acute exacerbation of CHF. She has diabetes which is stable. She has hypertension which is well controlled at this time. She has chronic dizziness which is unchanged Active Problems:   Hyperlipidemia   Hypertension   Diabetes mellitus, type II  Acute exacerbation of CHF (congestive heart failure)    Plan: Cardiology consultation today    LOS: 1 day   Levorn Oleski L 07/23/2014, 9:04 AM

## 2014-07-24 LAB — BASIC METABOLIC PANEL
ANION GAP: 5 (ref 5–15)
BUN: 18 mg/dL (ref 6–23)
CALCIUM: 9.2 mg/dL (ref 8.4–10.5)
CHLORIDE: 98 mmol/L (ref 96–112)
CO2: 29 mmol/L (ref 19–32)
Creatinine, Ser: 0.6 mg/dL (ref 0.50–1.10)
GFR calc non Af Amer: 88 mL/min — ABNORMAL LOW (ref >90)
Glucose, Bld: 167 mg/dL — ABNORMAL HIGH (ref 70–99)
Potassium: 3.9 mmol/L (ref 3.5–5.1)
Sodium: 132 mmol/L — ABNORMAL LOW (ref 135–145)

## 2014-07-24 LAB — GLUCOSE, CAPILLARY: Glucose-Capillary: 173 mg/dL — ABNORMAL HIGH (ref 70–99)

## 2014-07-24 MED ORDER — ATORVASTATIN CALCIUM 20 MG PO TABS: 20.0000 mg | ORAL_TABLET | Freq: Every day | ORAL | Status: DC

## 2014-07-24 NOTE — Discharge Summary (Signed)
Physician Discharge Summary  Patient ID: Donna [name] MRN: [id number] DOB/AGE: 12/02/1940 74 y.o. Primary Care Physician:Markiah Janeway L, MD Admit date: 07/22/2014 Discharge date: 07/24/2014    Discharge Diagnoses:   Active Problems:   Hyperlipidemia   Hypertension   Diabetes mellitus, type II   Acute exacerbation of CHF (congestive heart failure)  left bundle branch block    Medication List    STOP taking these medications        digoxin 0.125 MG tablet  Commonly known as:  LANOXIN     simvastatin 40 MG tablet  Commonly known as:  ZOCOR      TAKE these medications        amiodarone 200 MG tablet  Commonly known as:  PACERONE  Take 100 mg by mouth daily.     aspirin 81 MG tablet  Take 81 mg by mouth daily.     atorvastatin 20 MG tablet  Commonly known as:  LIPITOR  Take 1 tablet (20 mg total) by mouth daily at 6 PM.     calcium gluconate 500 MG tablet  Take 500 mg by mouth 2 (two) times daily.     carvedilol 3.125 MG tablet  Commonly known as:  COREG  Take 1 tablet (3.125 mg total) by mouth 2 (two) times daily.     fish oil-omega-3 fatty acids 1000 MG capsule  Take 1 capsule by mouth 3 (three) times daily.     furosemide 40 MG tablet  Commonly known as:  LASIX  Take 20-40 mg by mouth 2 (two) times daily. Pt takes 1 tablet in the morning and 0.5 tablet at supper.     losartan 100 MG tablet  Commonly known as:  COZAAR  Take 100 mg by mouth daily.     omeprazole 20 MG capsule  Commonly known as:  PRILOSEC  Take 20 mg by mouth 2 (two) times daily.     potassium chloride 20 MEQ packet  Commonly known as:  KLOR-CON  Take 20 mEq by mouth 2 (two) times daily.     spironolactone 25 MG tablet  Commonly known as:  ALDACTONE  Take 25 mg by mouth daily.        Discharged Condition: Improved    Consults: Cardiology  Significant Diagnostic Studies: Dg Chest Port 1 View  07/22/2014   CLINICAL DATA:  Dyspnea of this morning.  Paralyzed weakness.   EXAM: PORTABLE CHEST - 1 VIEW  COMPARISON:  05/19/2014  FINDINGS: There is moderate cardiomegaly, unchanged. There is diffuse interstitial thickening and vascular congestion consistent with congestive heart failure. There are ground-glass central basilar opacities which may represent alveolar edema. Tiny effusions are probably present.  IMPRESSION: Congestive heart failure   Electronically Signed   By: Andreas Newport M.D.   On: 07/22/2014 02:30    Lab Results: Basic Metabolic Panel:  Recent Labs  07/23/14 0601 07/24/14 0538  NA 133* 132*  K 4.0 3.9  CL 96 98  CO2 28 29  GLUCOSE 144* 167*  BUN 17 18  CREATININE 0.55 0.60  CALCIUM 9.2 9.2   Liver Function Tests:  Recent Labs  07/23/14 0601  AST 20  ALT 20  ALKPHOS 44  BILITOT 0.7  PROT 6.5  ALBUMIN 3.9     CBC:  Recent Labs  07/22/14 0546 07/23/14 0601  WBC 10.3 9.3  HGB 13.1 12.4  HCT 38.9 37.0  MCV 94.4 95.4  PLT 164 152    No results found for this or any previous  visit (from the past 240 hour(s)).   Hospital Course: She was admitted after waking up at home short of breath. She was found to have CHF and had PND. She has improved after being treated in the emergency department. She was evaluated by cardiology and felt that she needed some adjustment of her medications and this was done. Discussion was undertaken about device implantation since her ejection fraction is around 20% and although she has been thinking before that she did not want that she is more strongly considering it now. She remains symptom free through the rest of her hospitalization.  Discharge Exam: Blood pressure 132/80, pulse 65, temperature 99.2 F (37.3 C), temperature source Temporal, resp. rate 20, height 5\' 1"  (1.549 m), weight 49.9 kg (110 lb 0.2 oz), SpO2 99 %. She is awake and alert. Her chest is clear. She has an S3 gallop and a systolic murmur which is chronic. She has no edema  Disposition: Home she does not want home health  services. She has an appointment to follow-up with the cardiologist in about 1 week.      Discharge Instructions    Discharge patient    Complete by:  As directed              Signed: Charletha Dalpe L   07/24/2014, 9:12 AM

## 2014-07-24 NOTE — Progress Notes (Signed)
She feels better and has no complaints. She didn't have any shortness of breath last night. Cardiology help is noted and appreciated. She is strongly considering device implantation. She is ready for discharge

## 2014-07-24 NOTE — Progress Notes (Signed)
O2 sat 98% on RA while ambulating 200 ft in hall.

## 2014-07-31 ENCOUNTER — Ambulatory Visit (INDEPENDENT_AMBULATORY_CARE_PROVIDER_SITE_OTHER): Payer: 59 | Admitting: Adult Health

## 2014-07-31 ENCOUNTER — Encounter: Payer: Self-pay | Admitting: Adult Health

## 2014-07-31 VITALS — BP 122/60 | HR 64 | Ht 61.0 in | Wt 108.0 lb

## 2014-07-31 DIAGNOSIS — I10 Essential (primary) hypertension: Secondary | ICD-10-CM

## 2014-07-31 DIAGNOSIS — I429 Cardiomyopathy, unspecified: Secondary | ICD-10-CM

## 2014-07-31 NOTE — Patient Instructions (Addendum)
Your physician recommends that you schedule a follow-up appointment in: 1 month with [name], NP  Your physician recommends that you return for lab work just before your next visit.   Stop taking Digoxin   Sodium and Fluid Restriction Some health conditions may require you to restrict your sodium and fluid intake. Sodium is part of the salt in the blood. Sodium may be restricted because when you take in a lot of salt, you become thirsty. Limiting salt with help you become less thirsty and may make it easier to restrict fluid. Talk to your caregiver or dietician about how many cups of fluid and how many milligrams of sodium you are allowed each day. If your caregiver has restricted your sodium and fluids, usually the amount you can drink depends on several things, such as:  Your urine output.  How much fluid you are retaining.  Your blood pressure. Every 2 cups (500 mL) of fluid retained in the body becomes an extra 1 pound (0.5 kg) of body weight. The following are examples of some fluids you will have to restrict:  Tea, coffee, soda, lemonade, milk, water, and juice.  Alcoholic beverages.  Cream.  Gravy.  Ice cubes.  Soup and broth. The following are foods that become liquid at room temperature. These foods will count towards your fluid intake.  Ice cream and ice milk.  Frozen yogurt and sherbet.  Frozen ice pops.  Flavored gelatin. YOU MAY BE TAKING IN TOO MUCH FLUID IF:  Your weight increases.  Your face, hands, legs, feet, and abdomen start to swell.  You have trouble breathing. HOME CARE INSTRUCTIONS If you follow a low sodium diet closely, you will eat approximately 1,500 mg of sodium a day.   Avoid salty foods. This increases your thirst and makes fluid control more difficult. Foods high in sodium include:  Most canned foods, including most meats.  Most processed foods, including most meats.  Cheese.  Dried pasta and rice mixes.  Snack foods  (chips, popcorn, pretzels, cheese puffs, salted nuts).  Dips, sauces, and salad dressings.  Do not use salt in cooking or add salt to your meal. Lacinda Axon with herbs and spices, but not those that have salt in the name. Ask your caregiver if it is okay to use salt substitutes.  Eat home-prepared meals. Use fresh ingredients. Avoid canned, frozen, or packaged meals.  Read food labels to see how much sodium is in the food. Know how much sodium you are allowed each day.  When eating out, ask for dressings and sauces on the side.  Weigh yourself every morning with an empty bladder before you eat or drink. If your weight is going up, you are retaining fluid.  Freeze fruit juice or water in an ice cube tray. Use this as part of your fluid allowance.  Brush your teeth often or rinse your mouth with mouthwash to help your dry mouth. Lemon wedges, hard sour candies, chewing gum, or breath spray may help to moisten your mouth too.  Add a slice of fresh lemon or lemon juice to water or ice. This helps satisfy your thirst.  Try frozen fruits between meals, such as grapes or strawberries.  Swallow your pills along with meals or soft foods. This helps you save your fluid for something you enjoy.  Use small cups and glasses and learn to sip fluids slowly.  Keep your home cooler. Keep the air in your home as humid as possible. Dry air increases thirst.  Avoid  being out in the hot sun. Each morning, fill a jug with the amount of water you are allowed for the day. You can use this water as a guideline for fluid allowance. Each time you take in fluid, pour an equal amount of water out of the container. This helps you to see how much fluid you are taking in. It also helps plan your fluid intake for the rest of the day. CONVERSIONS TO HELP MEASURE FLUID INTAKE  1 cup equals 8 oz (240 mL).   cup equals 6 oz (180 mL).   cup equals 5  oz (160 mL).   cup equals 4 oz (120 mL).   cup equals 2  oz (80  mL).   cup equals 2 oz (60 mL).  2 tbs equals 1 oz (30 mL). Document Released: 03/20/2007 Document Revised: 11/22/2011 Document Reviewed: 11/04/2011 [facility] Patient Information 2015 [place], [place]. This information is not intended to replace advice given to you by your health care provider. Make sure you discuss any questions you have with your health care provider.  Thank you for choosing [facility]!

## 2014-07-31 NOTE — Progress Notes (Signed)
Cardiology Office Note   Date:  07/31/2014   ID:  Tawnee, Clegg 1941-03-14, MRN [id number]  PCP:  Alonza Bogus, MD  Cardiologist: Cloria Spring, NP   Chief Complaint  Patient presents with  . Cardiomyopathy    Nonischemic  . Tachycardia      History of Present Illness: Donna [name] is a 74 y.o. female who presents for ongoing assessment and management of nonischemic heart neuropathy, LVEF of 20% by echo in 2013, (patient refused, ICD), history of ventricular tachycardia, now on amiodarone, and hyperlipidemia.  The patient was last seen in the office in January 2016 by Dr. Harl Bowie.  She complained of dizziness, which was thought to be related to middle ear issue.  She is not be orthostatic.  Coreg was decreased to 3.125 mg twice a day.   Since being seen last, the patient was admitted to the hospital with decompensated CHF, hypertension.  Patient's diuresis.  Digoxin was discontinued, along with simvastatin.  She was continued on amiodarone carvedilol Lasix, and spironolactone.  The patient has not stopped taking her digoxin as recommended.  She is having complaints of generalized fatigue.  She is concerned about her fluid intake and is uncertain how much she should be drinking.  Otherwise, she is without complaint.  Past Medical History  Diagnosis Date  . Cardiomyopathy, nonischemic     Nl cors in 1989 and 1998; CHF in 12/97; EF of 40% in 5/01 and 7/03, 25% in 9/04 and 4/08.  systolic murmur without valvular abnormalities by echo; refused automatic implantable cardiac defibrillator  . Left bundle branch block   . Hypertension   . Hyperlipidemia   . Diabetes mellitus, type II     No insulin  . Cerebrovascular disease     Duplex study in 12/2006 shows atherosclerosis without focal stenosis  . Atrial flutter     Versus ventricular tachycardia; treated with amiodarone  . Gastroesophageal reflux disease   . Ventricular tachycardia     a. PMH it lists "atrial  flutter versus ventricular tachycardia treated with amiodarone" - details of this are unclear. Notes from back to 2006 indicate she has been maintained on amiodarone but do not describe further indication. Patient's sister reports pt was told she had skipped beats that could cause her to drop dead - Dr. Lattie Haw put her on amiodarone and recommended a defibrillator.    Past Surgical History  Procedure Laterality Date  . Hernia repair      x2  . Abdominal hysterectomy  2006  . Colonoscopy  2008     Current Outpatient Prescriptions  Medication Sig Dispense Refill  . amiodarone (PACERONE) 200 MG tablet Take 100 mg by mouth daily.    Marland Kitchen aspirin 81 MG tablet Take 81 mg by mouth daily.    Marland Kitchen atorvastatin (LIPITOR) 20 MG tablet Take 1 tablet (20 mg total) by mouth daily at 6 PM. 30 tablet 12  . calcium gluconate 500 MG tablet Take 500 mg by mouth 2 (two) times daily.      . carvedilol (COREG) 3.125 MG tablet Take 1 tablet (3.125 mg total) by mouth 2 (two) times daily. 60 tablet 6  . fish oil-omega-3 fatty acids 1000 MG capsule Take 1 capsule by mouth 3 (three) times daily.      . furosemide (LASIX) 40 MG tablet Take 20-40 mg by mouth 2 (two) times daily. Pt takes 1 tablet in the morning and 0.5 tablet at supper.    . losartan (  COZAAR) 100 MG tablet Take 100 mg by mouth daily.    Marland Kitchen omeprazole (PRILOSEC) 20 MG capsule Take 20 mg by mouth 2 (two) times daily.      . potassium chloride (KLOR-CON) 20 MEQ packet Take 20 mEq by mouth 2 (two) times daily.      Marland Kitchen spironolactone (ALDACTONE) 25 MG tablet Take 25 mg by mouth daily.       No current facility-administered medications for this visit.    Allergies:   Codeine and Decongestant    Social History:  The patient  reports that she has never smoked. She has never used smokeless tobacco. She reports that she does not drink alcohol.   Family History:  The patient's family history includes Diabetes in her mother and sister; Heart attack in her  brother; Kidney disease in her mother.    ROS:  Please see the history of present illness.    PHYSICAL EXAM: VS:  BP 122/60 mmHg  Pulse 64  Ht 5\' 1"  (1.549 m)  Wt 48.988 kg (108 lb)  BMI 20.42 kg/m2  SpO2 94% , BMI Body mass index is 20.42 kg/(m^2). GEN: Well nourished, well developed, in no acute distress HEENT: normal Neck: no JVD, carotid bruits, or masses Cardiac: RRR; 1/6 systolic murmur, rubs, or gallops,no edema  Respiratory:  clear to auscultation bilaterally, normal work of breathing GI: soft, nontender, nondistended, + BS MS: no deformity or atrophy Skin: warm and dry, no rash Neuro:  Strength and sensation are intact Psych: euthymic mood, full affect    Recent Labs: 05/19/2014: Pro B Natriuretic peptide (BNP) 1012.0* 07/22/2014: B Natriuretic Peptide 626.0* 07/23/2014: ALT 20; Hemoglobin 12.4; Platelets 152 07/24/2014: BUN 18; Creatinine 0.60; Potassium 3.9; Sodium 132*    Lipid Panel    Component Value Date/Time   CHOL 119 04/02/2009 0950   TRIG 172 04/02/2009 0950   HDL 34 04/02/2009 0950   CHOLHDL 5.1 05/09/2008 0332   VLDL 42* 05/09/2008 0332   LDLCALC 51 04/02/2009 0950      Wt Readings from Last 3 Encounters:  07/31/14 48.988 kg (108 lb)  07/23/14 49.9 kg (110 lb 0.2 oz)  06/25/14 49.442 kg (109 lb)        ASSESSMENT AND PLAN:  1.  Systolic dysfunction, with CHF: she will be given a copy of a 1500 cc fluid restriction, she is advised to stop taking digoxin as directed.  She will continue other medications.  Low-sodium diet as discussed.  2. Hypertension: Blood pressure is currently well controlled.  She does not appear to be fluid overloaded.  She is watching salt intake.   Current medicines are reviewed at length with the patient today.  Digoxin will be discontinued, and she did not remember.  She was not to take it. Labs/ tests ordered today include: BMET one month  Orders Placed This Encounter  Procedures  . Basic Metabolic Panel (BMET)      Disposition:   FU with1 month Signed, [name], NP  07/31/2014 3:12 PM    [facility] [address], [place], [place]  [address] Phone: [phone]; Fax: [phone]

## 2014-07-31 NOTE — Progress Notes (Deleted)
Name: Donna [name]    DOB: 06-Mar-1941  Age: 74 y.o.  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 Cardiomyopathy    Nonischemic  . Tachycardia    VS Filed Vitals:   07/31/14 1432  BP: 122/60  Pulse: 64  Height: 5\' 1"  (1.549 m)  Weight: 108 lb (48.988 kg)  SpO2: 94%    Weights Current Weight  07/31/14 108 lb (48.988 kg)  07/23/14 110 lb 0.2 oz (49.9 kg)  06/25/14 109 lb (49.442 kg)    Blood Pressure  BP Readings from Last 3 Encounters:  07/31/14 122/60  07/24/14 132/80  06/25/14 106/72     Admit date:  (Not on file) Last encounter with RMR:  Visit date not found   Allergy Codeine and Decongestant  Current Outpatient Prescriptions  Medication Sig Dispense Refill  . amiodarone (PACERONE) 200 MG tablet Take 100 mg by mouth daily.    Marland Kitchen aspirin 81 MG tablet Take 81 mg by mouth daily.    Marland Kitchen atorvastatin (LIPITOR) 20 MG tablet Take 1 tablet (20 mg total) by mouth daily at 6 PM. 30 tablet 12  . calcium gluconate 500 MG tablet Take 500 mg by mouth 2 (two) times daily.      . carvedilol (COREG) 3.125 MG tablet Take 1 tablet (3.125 mg total) by mouth 2 (two) times daily. 60 tablet 6  . fish oil-omega-3 fatty acids 1000 MG capsule Take 1 capsule by mouth 3 (three) times daily.      . furosemide (LASIX) 40 MG tablet Take 20-40 mg by mouth 2 (two) times daily. Pt takes 1 tablet in the morning and 0.5 tablet at supper.    . losartan (COZAAR) 100 MG tablet Take 100 mg by mouth daily.    Marland Kitchen omeprazole (PRILOSEC) 20 MG capsule Take 20 mg by mouth 2 (two) times daily.      . potassium chloride (KLOR-CON) 20 MEQ packet Take 20 mEq by mouth 2 (two) times daily.      Marland Kitchen spironolactone (ALDACTONE) 25 MG tablet Take 25 mg by mouth daily.       No current facility-administered medications for this visit.     Discontinued Meds:   There are no discontinued medications.  Patient Active Problem List   Diagnosis Date Noted  . CHF exacerbation 07/22/2014  . Acute exacerbation of CHF (congestive heart failure) 07/22/2014  . Abnormality of gait 09/26/2013  . Weakness of left hip 09/26/2013  . Diabetes mellitus, type II 06/15/2011  . Cardiomyopathy, nonischemic   . Hypertension   . HYPOTHYROIDISM 05/28/2009  . LEFT BUNDLE BRANCH BLOCK 05/28/2009  . GASTROESOPHAGEAL REFLUX DISEASE 05/28/2009  . Hyperlipidemia 05/14/2008  . VENTRICULAR TACHYCARDIA 05/14/2008  . CEREBROVASCULAR DISEASE 05/14/2008    LABS    Component Value Date/Time   NA 132* 07/24/2014 0538   NA 133* 07/23/2014 0601   NA 128* 07/22/2014 0230   K 3.9 07/24/2014 0538   K 4.0 07/23/2014 0601   K 3.8 07/22/2014 0230   CL 98 07/24/2014 0538   CL 96 07/23/2014 0601   CL 97 07/22/2014 0230   CO2 29 07/24/2014 0538   CO2 28 07/23/2014 0601   CO2 25 07/22/2014 0230   GLUCOSE 167* 07/24/2014 0538   GLUCOSE 144* 07/23/2014  0601   GLUCOSE 224* 07/22/2014 0230   BUN 18 07/24/2014 0538   BUN 17 07/23/2014 0601   BUN 17 07/22/2014 0230   CREATININE 0.60 07/24/2014 0538   CREATININE 0.55 07/23/2014 0601   CREATININE 0.60 07/22/2014 0546   CREATININE 0.57 12/06/2010 0816   CALCIUM 9.2 07/24/2014 0538   CALCIUM 9.2 07/23/2014 0601   CALCIUM 8.6 07/22/2014 0230   GFRNONAA 88* 07/24/2014 0538   GFRNONAA >90 07/23/2014 0601   GFRNONAA 88* 07/22/2014 0546   GFRAA >90 07/24/2014 0538   GFRAA >90 07/23/2014 0601   GFRAA >90 07/22/2014 0546   CMP     Component Value Date/Time   NA 132* 07/24/2014 0538   K 3.9 07/24/2014 0538   CL 98 07/24/2014 0538   CO2 29 07/24/2014 0538   GLUCOSE 167* 07/24/2014 0538   BUN 18 07/24/2014 0538   CREATININE 0.60 07/24/2014 0538   CREATININE 0.57 12/06/2010 0816   CALCIUM 9.2 07/24/2014 0538   PROT 6.5 07/23/2014 0601   ALBUMIN 3.9 07/23/2014 0601   AST 20 07/23/2014 0601   ALT 20  07/23/2014 0601   ALKPHOS 44 07/23/2014 0601   BILITOT 0.7 07/23/2014 0601   GFRNONAA 88* 07/24/2014 0538   GFRAA >90 07/24/2014 0538       Component Value Date/Time   WBC 9.3 07/23/2014 0601   WBC 10.3 07/22/2014 0546   WBC 10.5 07/22/2014 0230   HGB 12.4 07/23/2014 0601   HGB 13.1 07/22/2014 0546   HGB 13.1 07/22/2014 0230   HCT 37.0 07/23/2014 0601   HCT 38.9 07/22/2014 0546   HCT 38.8 07/22/2014 0230   MCV 95.4 07/23/2014 0601   MCV 94.4 07/22/2014 0546   MCV 94.4 07/22/2014 0230    Lipid Panel     Component Value Date/Time   CHOL 119 04/02/2009 0950   TRIG 172 04/02/2009 0950   HDL 34 04/02/2009 0950   CHOLHDL 5.1 05/09/2008 0332   VLDL 42* 05/09/2008 0332   LDLCALC 51 04/02/2009 0950    ABG    Component Value Date/Time   TCO2 30 05/08/2008 0742     Lab Results  Component Value Date   TSH 1.720 12/06/2010   BNP (last 3 results)  Recent Labs  07/22/14 0230  BNP 626.0*    ProBNP (last 3 results)  Recent Labs  05/19/14 0304  PROBNP 1012.0*    Cardiac Panel (last 3 results) No results for input(s): CKTOTAL, CKMB, TROPONINI, RELINDX in the last 72 hours.  Iron/TIBC/Ferritin/ %Sat No results found for: IRON, TIBC, FERRITIN, IRONPCTSAT   EKG Orders placed or performed during the hospital encounter of 07/22/14  . EKG 12-Lead  . EKG 12-Lead  . EKG     Prior Assessment and Plan Problem List as of 07/31/2014      Cardiovascular and Mediastinum   LEFT BUNDLE BRANCH BLOCK   VENTRICULAR TACHYCARDIA   Last Assessment & Plan 06/14/2012 Office Visit Written 06/15/2012 10:54 AM by Yehuda Savannah, MD    Recent laboratory studies from patient's multiple physicians will be sought.  If any necessary testing for monitoring of amiodarone therapy has not been performed, we will arrange for it to be completed.  Chest x-ray has been requested.      CEREBROVASCULAR DISEASE   Last Assessment & Plan 06/14/2012 Office Visit Written 06/15/2012 10:50 AM by Yehuda Savannah, MD    Asymptomatic carotid bruit with atherosclerosis but no focal stenosis on ultrasound study in 12/2006 No bruit heard in  2013-does not appear to be an active problem.        Cardiomyopathy, nonischemic   Last Assessment & Plan 06/14/2012 Office Visit Written 06/15/2012 10:49 AM by Yehuda Savannah, MD    The patient remains well compensated with respect to nonischemic cardiomyopathy.  EF has been somewhat variable in the past, but has been consistently below 30% for at least the past decade.  Despite this, Ms. Guilford's functional status is excellent.  Current therapy will be maintained.  Electrolytes and renal function will be monitored.      Hypertension   Last Assessment & Plan 06/14/2012 Office Visit Written 06/15/2012 10:53 AM by Yehuda Savannah, MD    Cardiac medications utilized to treat cardiomyopathy have provided excellent control of hypertension for many years.      CHF exacerbation   Acute exacerbation of CHF (congestive heart failure)     Digestive   GASTROESOPHAGEAL REFLUX DISEASE     Endocrine   HYPOTHYROIDISM   Last Assessment & Plan 06/15/2011 Office Visit Edited 06/22/2011 12:12 PM by Yehuda Savannah, MD    Euthyroid clinically and chemically as assessed by a normal TSH 7 months ago, on 0.075 mg of levothyroxine per day      Diabetes mellitus, type II   Last Assessment & Plan 06/14/2012 Office Visit Edited 06/17/2012 12:45 PM by Yehuda Savannah, MD    Control of diabetes has not been a major problem.  Recent CBGs of around 160 as reported by the patient suggests that there is still some room for improvement.        Other   Hyperlipidemia   Last Assessment & Plan 06/14/2012 Office Visit Written 06/15/2012 10:52 AM by Yehuda Savannah, MD    Lipid profile has been excellent with current therapy, most recently in 03/2011.  Dr. Luan Pulling is managing and monitoring this issue.      Abnormality of gait   Weakness of left hip       Imaging: Dg Chest Port  1 View  07/22/2014   CLINICAL DATA:  Dyspnea of this morning.  Paralyzed weakness.  EXAM: PORTABLE CHEST - 1 VIEW  COMPARISON:  05/19/2014  FINDINGS: There is moderate cardiomegaly, unchanged. There is diffuse interstitial thickening and vascular congestion consistent with congestive heart failure. There are ground-glass central basilar opacities which may represent alveolar edema. Tiny effusions are probably present.  IMPRESSION: Congestive heart failure   Electronically Signed   By: Andreas Newport M.D.   On: 07/22/2014 02:30

## 2014-08-21 DIAGNOSIS — I10 Essential (primary) hypertension: Secondary | ICD-10-CM | POA: Diagnosis not present

## 2014-08-22 LAB — BASIC METABOLIC PANEL
BUN: 20 mg/dL (ref 6–23)
CO2: 30 mEq/L (ref 19–32)
Calcium: 9.5 mg/dL (ref 8.4–10.5)
Chloride: 100 mEq/L (ref 96–112)
Creat: 0.51 mg/dL (ref 0.50–1.10)
Glucose, Bld: 131 mg/dL — ABNORMAL HIGH (ref 70–99)
Potassium: 4.3 mEq/L (ref 3.5–5.3)
SODIUM: 138 meq/L (ref 135–145)

## 2014-08-29 ENCOUNTER — Ambulatory Visit (INDEPENDENT_AMBULATORY_CARE_PROVIDER_SITE_OTHER): Payer: Medicare Other | Admitting: Adult Health

## 2014-08-29 ENCOUNTER — Encounter: Payer: Self-pay | Admitting: Adult Health

## 2014-08-29 VITALS — BP 120/70 | HR 65 | Ht 61.0 in | Wt 111.0 lb

## 2014-08-29 DIAGNOSIS — I10 Essential (primary) hypertension: Secondary | ICD-10-CM | POA: Diagnosis not present

## 2014-08-29 DIAGNOSIS — I447 Left bundle-branch block, unspecified: Secondary | ICD-10-CM

## 2014-08-29 DIAGNOSIS — I420 Dilated cardiomyopathy: Secondary | ICD-10-CM

## 2014-08-29 NOTE — Progress Notes (Addendum)
Cardiology Office Note   Date:  08/29/2014   ID:  Donna, [name] 11-Oct-1940, MRN [id number]  PCP:  Alonza Bogus, MD  Cardiologist:  Cloria Spring, NP   Chief Complaint  Patient presents with  . Cardiomyopathy    Nonischemic EF 20%      History of Present Illness: Donna [name] is a 74 y.o. female who presents for ongoing assessment and management of nonischemic cardiomyopathy, LVEF of 20% by echo in 2013, (the patient refused ICD) also, history of ventricular tachycardia on amiodarone, and hyperlipidemia.  The patient was last seen in the office of preferred 2016 after her hospitalization for decompensated systolic CHF, and hypertension.  On last office visit.  She was given a copy of a 1500 cc fluid restriction.  Diet.  She is advised to stop taking digoxin, and a low-sodium diet, was discussed.  She is here for one-month followup for close observation.she has gained approximately 5 pounds since being released from the hospital, but the scales are different.  She denies any complaints of lower extremity edema,dyspnea or chest pressure.     Past Medical History  Diagnosis Date  . Cardiomyopathy, nonischemic     Nl cors in 1989 and 1998; CHF in 12/97; EF of 40% in 5/01 and 7/03, 25% in 9/04 and 4/08.  systolic murmur without valvular abnormalities by echo; refused automatic implantable cardiac defibrillator  . Left bundle branch block   . Hypertension   . Hyperlipidemia   . Diabetes mellitus, type II     No insulin  . Cerebrovascular disease     Duplex study in 12/2006 shows atherosclerosis without focal stenosis  . Atrial flutter     Versus ventricular tachycardia; treated with amiodarone  . Gastroesophageal reflux disease   . Ventricular tachycardia     a. PMH it lists "atrial flutter versus ventricular tachycardia treated with amiodarone" - details of this are unclear. Notes from back to 2006 indicate she has been maintained on amiodarone but do not  describe further indication. Patient's sister reports pt was told she had skipped beats that could cause her to drop dead - Dr. Lattie Haw put her on amiodarone and recommended a defibrillator.    Past Surgical History  Procedure Laterality Date  . Hernia repair      x2  . Abdominal hysterectomy  2006  . Colonoscopy  2008     Current Outpatient Prescriptions  Medication Sig Dispense Refill  . amiodarone (PACERONE) 200 MG tablet Take 100 mg by mouth daily.    Marland Kitchen aspirin 81 MG tablet Take 81 mg by mouth daily.    Marland Kitchen atorvastatin (LIPITOR) 20 MG tablet Take 1 tablet (20 mg total) by mouth daily at 6 PM. 30 tablet 12  . calcium gluconate 500 MG tablet Take 500 mg by mouth 2 (two) times daily.      . carvedilol (COREG) 3.125 MG tablet Take 1 tablet (3.125 mg total) by mouth 2 (two) times daily. 60 tablet 6  . fish oil-omega-3 fatty acids 1000 MG capsule Take 1 capsule by mouth 3 (three) times daily.      . furosemide (LASIX) 40 MG tablet Take 20-40 mg by mouth 2 (two) times daily. Pt takes 1 tablet in the morning and 0.5 tablet at supper.    . losartan (COZAAR) 100 MG tablet Take 100 mg by mouth daily.    Marland Kitchen omeprazole (PRILOSEC) 20 MG capsule Take 20 mg by mouth 2 (two) times daily.      Marland Kitchen  potassium chloride (KLOR-CON) 20 MEQ packet Take 20 mEq by mouth 2 (two) times daily.      Marland Kitchen spironolactone (ALDACTONE) 25 MG tablet Take 25 mg by mouth daily.       No current facility-administered medications for this visit.    Allergies:   Codeine and Decongestant    Social History:  The patient  reports that she has never smoked. She has never used smokeless tobacco. She reports that she does not drink alcohol.   Family History:  The patient's family history includes Diabetes in her mother and sister; Heart attack in her brother; Kidney disease in her mother.    ROS: .   All other systems are reviewed and negative.Unless otherwise mentioned in H&P above.   PHYSICAL EXAM: VS:  There were no  vitals taken for this visit. , BMI There is no weight on file to calculate BMI. GEN: Well nourished, well developed, in no acute distress HEENT: normal Neck: no JVD, carotid bruits, or masses Cardiac: Distant heart sounds, ENI;7/7 systolic murmur, Respiratory: Clear to auscultation GI: soft, nontender, nondistended, + BS MS: no deformity or atrophy Skin: warm and dry, no rashI Venous statis changes are noted.  Bilateral lower extremity in the pretibial area Neuro:  Strength and sensation are intact Psych: euthymic mood, full affect   Recent Labs: 05/19/2014: Pro B Natriuretic peptide (BNP) 1012.0* 07/22/2014: B Natriuretic Peptide 626.0* 07/23/2014: ALT 20; Hemoglobin 12.4; Platelets 152 08/21/2014: BUN 20; Creatinine 0.51; Potassium 4.3; Sodium 138    Lipid Panel    Component Value Date/Time   CHOL 119 04/02/2009 0950   TRIG 172 04/02/2009 0950   HDL 34 04/02/2009 0950   CHOLHDL 5.1 05/09/2008 0332   VLDL 42* 05/09/2008 0332   LDLCALC 51 04/02/2009 0950      Wt Readings from Last 3 Encounters:  07/31/14 108 lb (48.988 kg)  07/23/14 110 lb 0.2 oz (49.9 kg)  06/25/14 109 lb (49.442 kg)      Other studies Reviewed: Additional studies/ records that were reviewed today include: cardiac catheterization. Review of the above records demonstrates: normal coronary arteries   ASSESSMENT AND PLAN:  1.  Nonischemic myopathy, EF of 40%:the patient is weigh herself daily and is without complaints.  She is avoiding salty foods and maintaining a 1500 cc fluid restriction diet.  She is doing very well, and is without any complaints.  She is medically compliant.  A home health nurse visits her once a week.  I will make no changes in her medication regimen.  She is doing so well, maintaining her current health status.  We will see her in 6 months unless she becomes symptomatic.  2. Hypertension: blood pressure is well-controlled.  She states she occasionally has some dizziness with position  change, but no near syncope.  We will continue her on her current medication regimen.  We will see her in 6 months unless symptomatic.  3. Ventricular tachycardia, chronic left bundle branch block:heart rate is maintained without recurrent symptoms of palpitations, or racing heart on amiodarone 200 mg one half tablet daily, carvedilol 3.125 mg twice a day.   Current medicines are reviewed at length with the patient today.    No orders of the defined types were placed in this encounter.     Disposition:   FU with 6 months   Signed, [name], NP  08/29/2014 7:13 AM    [facility] [address], [place], [place] [address] Phone: [phone];  Fax: [phone]

## 2014-08-29 NOTE — Patient Instructions (Signed)
Your physician wants you to follow-up in: 6 months with Kathryn Lawrence, NP. You will receive a reminder letter in the mail two months in advance. If you don't receive a letter, please call our office to schedule the follow-up appointment.  Your physician recommends that you continue on your current medications as directed. Please refer to the Current Medication list given to you today.  Thank you for choosing [facility]!   

## 2014-08-29 NOTE — Progress Notes (Deleted)
Name: Donna [name]    DOB: 1940/11/15  Age: 74 y.o.  MR#: [id number]       PCP:  Alonza Bogus, MD      Insurance: Payor: [employer] MEDICARE / Plan: [facility] MEDICARE / Product Type: *No Product type* /   CC:    Chief Complaint  Patient presents with  . Cardiomyopathy    Nonischemic EF 20%    VS Filed Vitals:   08/29/14 1344  BP: 120/70  Pulse: 65  Height: 5\' 1"  (1.549 m)  Weight: 111 lb (50.349 kg)  SpO2: 96%    Weights Current Weight  08/29/14 111 lb (50.349 kg)  07/31/14 108 lb (48.988 kg)  07/23/14 110 lb 0.2 oz (49.9 kg)    Blood Pressure  BP Readings from Last 3 Encounters:  08/29/14 120/70  07/31/14 122/60  07/24/14 132/80     Admit date:  (Not on file) Last encounter with RMR:  07/31/2014   Allergy Codeine and Decongestant  Current Outpatient Prescriptions  Medication Sig Dispense Refill  . amiodarone (PACERONE) 200 MG tablet Take 100 mg by mouth daily.    Marland Kitchen aspirin 81 MG tablet Take 81 mg by mouth daily.    Marland Kitchen atorvastatin (LIPITOR) 20 MG tablet Take 1 tablet (20 mg total) by mouth daily at 6 PM. 30 tablet 12  . calcium gluconate 500 MG tablet Take 500 mg by mouth 2 (two) times daily.      . carvedilol (COREG) 3.125 MG tablet Take 1 tablet (3.125 mg total) by mouth 2 (two) times daily. 60 tablet 6  . fish oil-omega-3 fatty acids 1000 MG capsule Take 1 capsule by mouth 3 (three) times daily.      . furosemide (LASIX) 40 MG tablet Take 20-40 mg by mouth 2 (two) times daily. Pt takes 1 tablet in the morning and 0.5 tablet at supper.    . levothyroxine (SYNTHROID, LEVOTHROID) 75 MCG tablet Take 75 mcg by mouth daily before breakfast.    . losartan (COZAAR) 100 MG tablet Take 100 mg by mouth daily.    Marland Kitchen omeprazole (PRILOSEC) 20 MG capsule Take 20 mg by mouth 2 (two) times daily.      . potassium chloride (KLOR-CON) 20 MEQ packet Take 20 mEq by mouth 2 (two) times daily.      . simvastatin (ZOCOR) 40 MG tablet Take 40 mg by mouth daily.    Marland Kitchen spironolactone  (ALDACTONE) 25 MG tablet Take 25 mg by mouth daily.       No current facility-administered medications for this visit.    Discontinued Meds:   There are no discontinued medications.  Patient Active Problem List   Diagnosis Date Noted  . CHF exacerbation 07/22/2014  . Acute exacerbation of CHF (congestive heart failure) 07/22/2014  . Abnormality of gait 09/26/2013  . Weakness of left hip 09/26/2013  . Diabetes mellitus, type II 06/15/2011  . Cardiomyopathy, nonischemic   . Hypertension   . HYPOTHYROIDISM 05/28/2009  . LEFT BUNDLE BRANCH BLOCK 05/28/2009  . GASTROESOPHAGEAL REFLUX DISEASE 05/28/2009  . Hyperlipidemia 05/14/2008  . VENTRICULAR TACHYCARDIA 05/14/2008  . CEREBROVASCULAR DISEASE 05/14/2008    LABS    Component Value Date/Time   NA 138 08/21/2014 1012   NA 132* 07/24/2014 0538   NA 133* 07/23/2014 0601   K 4.3 08/21/2014 1012   K 3.9 07/24/2014 0538   K 4.0 07/23/2014 0601   CL 100 08/21/2014 1012   CL 98 07/24/2014 0538   CL 96 07/23/2014 0601  CO2 30 08/21/2014 1012   CO2 29 07/24/2014 0538   CO2 28 07/23/2014 0601   GLUCOSE 131* 08/21/2014 1012   GLUCOSE 167* 07/24/2014 0538   GLUCOSE 144* 07/23/2014 0601   BUN 20 08/21/2014 1012   BUN 18 07/24/2014 0538   BUN 17 07/23/2014 0601   CREATININE 0.51 08/21/2014 1012   CREATININE 0.60 07/24/2014 0538   CREATININE 0.55 07/23/2014 0601   CREATININE 0.60 07/22/2014 0546   CREATININE 0.57 12/06/2010 0816   CALCIUM 9.5 08/21/2014 1012   CALCIUM 9.2 07/24/2014 0538   CALCIUM 9.2 07/23/2014 0601   GFRNONAA 88* 07/24/2014 0538   GFRNONAA >90 07/23/2014 0601   GFRNONAA 88* 07/22/2014 0546   GFRAA >90 07/24/2014 0538   GFRAA >90 07/23/2014 0601   GFRAA >90 07/22/2014 0546   CMP     Component Value Date/Time   NA 138 08/21/2014 1012   K 4.3 08/21/2014 1012   CL 100 08/21/2014 1012   CO2 30 08/21/2014 1012   GLUCOSE 131* 08/21/2014 1012   BUN 20 08/21/2014 1012   CREATININE 0.51 08/21/2014 1012    CREATININE 0.60 07/24/2014 0538   CALCIUM 9.5 08/21/2014 1012   PROT 6.5 07/23/2014 0601   ALBUMIN 3.9 07/23/2014 0601   AST 20 07/23/2014 0601   ALT 20 07/23/2014 0601   ALKPHOS 44 07/23/2014 0601   BILITOT 0.7 07/23/2014 0601   GFRNONAA 88* 07/24/2014 0538   GFRAA >90 07/24/2014 0538       Component Value Date/Time   WBC 9.3 07/23/2014 0601   WBC 10.3 07/22/2014 0546   WBC 10.5 07/22/2014 0230   HGB 12.4 07/23/2014 0601   HGB 13.1 07/22/2014 0546   HGB 13.1 07/22/2014 0230   HCT 37.0 07/23/2014 0601   HCT 38.9 07/22/2014 0546   HCT 38.8 07/22/2014 0230   MCV 95.4 07/23/2014 0601   MCV 94.4 07/22/2014 0546   MCV 94.4 07/22/2014 0230    Lipid Panel     Component Value Date/Time   CHOL 119 04/02/2009 0950   TRIG 172 04/02/2009 0950   HDL 34 04/02/2009 0950   CHOLHDL 5.1 05/09/2008 0332   VLDL 42* 05/09/2008 0332   LDLCALC 51 04/02/2009 0950    ABG    Component Value Date/Time   TCO2 30 05/08/2008 0742     Lab Results  Component Value Date   TSH 1.720 12/06/2010   BNP (last 3 results)  Recent Labs  07/22/14 0230  BNP 626.0*    ProBNP (last 3 results)  Recent Labs  05/19/14 0304  PROBNP 1012.0*    Cardiac Panel (last 3 results) No results for input(s): CKTOTAL, CKMB, TROPONINI, RELINDX in the last 72 hours.  Iron/TIBC/Ferritin/ %Sat No results found for: IRON, TIBC, FERRITIN, IRONPCTSAT   EKG Orders placed or performed during the hospital encounter of 07/22/14  . EKG 12-Lead  . EKG 12-Lead  . EKG     Prior Assessment and Plan Problem List as of 08/29/2014      Cardiovascular and Mediastinum   LEFT BUNDLE BRANCH BLOCK   VENTRICULAR TACHYCARDIA   Last Assessment & Plan 06/14/2012 Office Visit Written 06/15/2012 10:54 AM by Yehuda Savannah, MD    Recent laboratory studies from patient's multiple physicians will be sought.  If any necessary testing for monitoring of amiodarone therapy has not been performed, we will arrange for it to be  completed.  Chest x-ray has been requested.      CEREBROVASCULAR DISEASE   Last Assessment & Plan  06/14/2012 Office Visit Written 06/15/2012 10:50 AM by Yehuda Savannah, MD    Asymptomatic carotid bruit with atherosclerosis but no focal stenosis on ultrasound study in 12/2006 No bruit heard in 2013-does not appear to be an active problem.        Cardiomyopathy, nonischemic   Last Assessment & Plan 06/14/2012 Office Visit Written 06/15/2012 10:49 AM by Yehuda Savannah, MD    The patient remains well compensated with respect to nonischemic cardiomyopathy.  EF has been somewhat variable in the past, but has been consistently below 30% for at least the past decade.  Despite this, Ms. Ewton's functional status is excellent.  Current therapy will be maintained.  Electrolytes and renal function will be monitored.      Hypertension   Last Assessment & Plan 06/14/2012 Office Visit Written 06/15/2012 10:53 AM by Yehuda Savannah, MD    Cardiac medications utilized to treat cardiomyopathy have provided excellent control of hypertension for many years.      CHF exacerbation   Acute exacerbation of CHF (congestive heart failure)     Digestive   GASTROESOPHAGEAL REFLUX DISEASE     Endocrine   HYPOTHYROIDISM   Last Assessment & Plan 06/15/2011 Office Visit Edited 06/22/2011 12:12 PM by Yehuda Savannah, MD    Euthyroid clinically and chemically as assessed by a normal TSH 7 months ago, on 0.075 mg of levothyroxine per day      Diabetes mellitus, type II   Last Assessment & Plan 06/14/2012 Office Visit Edited 06/17/2012 12:45 PM by Yehuda Savannah, MD    Control of diabetes has not been a major problem.  Recent CBGs of around 160 as reported by the patient suggests that there is still some room for improvement.        Other   Hyperlipidemia   Last Assessment & Plan 06/14/2012 Office Visit Written 06/15/2012 10:52 AM by Yehuda Savannah, MD    Lipid profile has been excellent with current therapy,  most recently in 03/2011.  Dr. Luan Pulling is managing and monitoring this issue.      Abnormality of gait   Weakness of left hip       Imaging: No results found.

## 2014-09-16 ENCOUNTER — Encounter: Payer: Self-pay | Admitting: *Deleted

## 2014-09-16 DIAGNOSIS — E1165 Type 2 diabetes mellitus with hyperglycemia: Secondary | ICD-10-CM | POA: Diagnosis not present

## 2014-09-16 DIAGNOSIS — F419 Anxiety disorder, unspecified: Secondary | ICD-10-CM | POA: Diagnosis not present

## 2014-09-16 DIAGNOSIS — I110 Hypertensive heart disease with heart failure: Secondary | ICD-10-CM | POA: Diagnosis not present

## 2014-09-16 DIAGNOSIS — I499 Cardiac arrhythmia, unspecified: Secondary | ICD-10-CM | POA: Diagnosis not present

## 2014-09-16 NOTE — Addendum Note (Signed)
Addended by: Clerance Lav on: 09/16/2014 12:57 PM   Modules accepted: Medications

## 2014-09-16 NOTE — Progress Notes (Signed)
This encounter was created in error - please disregard.

## 2014-09-17 DIAGNOSIS — I499 Cardiac arrhythmia, unspecified: Secondary | ICD-10-CM | POA: Diagnosis not present

## 2014-09-17 DIAGNOSIS — F419 Anxiety disorder, unspecified: Secondary | ICD-10-CM | POA: Diagnosis not present

## 2014-09-17 DIAGNOSIS — I110 Hypertensive heart disease with heart failure: Secondary | ICD-10-CM | POA: Diagnosis not present

## 2014-09-17 DIAGNOSIS — E1165 Type 2 diabetes mellitus with hyperglycemia: Secondary | ICD-10-CM | POA: Diagnosis not present

## 2014-09-25 ENCOUNTER — Other Ambulatory Visit: Payer: Self-pay | Admitting: *Deleted

## 2014-09-25 NOTE — Patient Outreach (Addendum)
[facility]) Care Management  09/25/2014  KATASHA RIGA Jan 21, 1941 [id number]  Mrs. Bryars has been followed by [facility] since 07/28/14 for CHF disease management. She has been eager to learn all she can about health self management and has done an exceptional job executing on goals we mutually established. In particular, Mrs. Medine has made significant adjustments to her fluid intake (she has a 2 Liter bottle she uses to measure daily fluids) and is weighing and recording her weight daily. Mrs Holroyd is taking medications exactly as prescribed and is seeing providers as scheduled. She most recently saw [name] NP at [facility], [facility] and had a good visit with no changes to her treatment regimen.   When Mrs. Pinheiro and I visited a few weeks ago, she said she felt confident moving forward with health self management and was able to verbalize to me signs and symptoms of worsening condition and when to call for help. She also stated that unless she decided to have an AICD implanted and needed post procedure follow up, she could call if she needed help but wouldn't need further home visits.   I have referred Mrs. Ault back to the [employer]. The program will call her routinely and install a telemonitoring system in her home if she wishes.   I have provided Mrs. Rzepka with my contact information and she can call at any time if she has needs, quetstions or concerns or feels she needs full engagement with [facility] again.   I will discharge Mrs. Pendergraft from active [facility] services at this time.   [facility] CM Care Plan Problem One        Patient Outreach Telephone from 09/25/2014 in [place] Problem One  recent hospitalization for CHF exacerbation   Care Plan for Problem One  Not Active   [facility] Long Term Goal Start Date  07/28/14   [facility] Long Term Goal Met Date   09/25/14   Interventions for Problem One Long Term Goal  utilizing teachback method, reviewed signs and symptoms of CHF exacerbation/volume overload,  utilizing teachback method, reviewed CHF evaluation tool in blue [facility] care management notebook/calendar   [facility] CM Short Term Goal #1 (0-30 days)  patient will verbalize understanding of signs and symptoms of worsening heart failure and when to call the doctor in the next 30 days as evidenced by patient report   [facility] CM Short Term Goal #1 Start Date  07/28/14   [facility] CM Short Term Goal #1 Met Date  09/25/14   Interventions for Short Term Goal #1  utilizing teachback method, reviewed signs and symptoms of CHF exacerbation/volume overload,  utilizing teachback methodl, reviewed CHF evaluation tool in blue [facility] care management notebook/calendar   [facility] CM Short Term Goal #2 (0-30 days)  patient will weigh daily and record, calling doctor for weight gain of 3# overnight or 5# in a week as evidenced by patient report/documentation   [facility] CM Short Term Goal #2 Start Date  07/28/14   [facility] CM Short Term Goal #2 Met Date  09/25/14   Interventions for Short Term Goal #2  utilizing teachback method, reviewed with patient rationale and importance of daily weights,  helped patient determine best place for scales and monitoring document for daily weights,  reviewed with patient when to call for weights outside established parameters   [facility] CM Short Term Goal #3 (0-30 days)  patient will take  medications as prescribed 100% of the time over the next 30 days as evidenced by pill count   [facility] CM Short Term Goal #3 Start Date  07/28/14   [facility] CM Short Term Goal #3 Met Date  09/25/14   Interventions for Short Tern Goal #3  reviewed all medications with patient       [facility]  [phone]

## 2014-10-08 ENCOUNTER — Encounter: Payer: Self-pay | Admitting: *Deleted

## 2014-10-08 NOTE — Patient Outreach (Signed)
I referred Donna [name] to her [facility] Case Management team for placement of telephonic/electronic home monitoring system. Notified our team liaison today of needed contact for nurse to nurse discussion of details of patient case. Will follow up with [facility] team and will not discharge patient from [facility] Management services until electronic monitoring system is installed and enrollment in payor disease management is accomplished.  [facility]  [phone]

## 2014-10-15 ENCOUNTER — Other Ambulatory Visit: Payer: Self-pay | Admitting: *Deleted

## 2014-10-18 ENCOUNTER — Observation Stay (HOSPITAL_COMMUNITY): Payer: Medicare Other

## 2014-10-18 ENCOUNTER — Inpatient Hospital Stay (HOSPITAL_COMMUNITY)
Admission: EM | Admit: 2014-10-18 | Discharge: 2014-10-20 | DRG: 291 | Disposition: A | Discharge status: Home / Self Care | Payer: Medicare Other | Attending: Pulmonary Disease | Admitting: Pulmonary Disease

## 2014-10-18 ENCOUNTER — Encounter (HOSPITAL_COMMUNITY): Payer: Self-pay

## 2014-10-18 ENCOUNTER — Emergency Department (HOSPITAL_COMMUNITY): Payer: Medicare Other

## 2014-10-18 DIAGNOSIS — I5023 Acute on chronic systolic (congestive) heart failure: Secondary | ICD-10-CM | POA: Diagnosis not present

## 2014-10-18 DIAGNOSIS — R0600 Dyspnea, unspecified: Secondary | ICD-10-CM | POA: Diagnosis not present

## 2014-10-18 DIAGNOSIS — E119 Type 2 diabetes mellitus without complications: Secondary | ICD-10-CM

## 2014-10-18 DIAGNOSIS — Z8673 Personal history of transient ischemic attack (TIA), and cerebral infarction without residual deficits: Secondary | ICD-10-CM

## 2014-10-18 DIAGNOSIS — R0602 Shortness of breath: Secondary | ICD-10-CM | POA: Diagnosis not present

## 2014-10-18 DIAGNOSIS — I509 Heart failure, unspecified: Secondary | ICD-10-CM | POA: Diagnosis not present

## 2014-10-18 DIAGNOSIS — I429 Cardiomyopathy, unspecified: Secondary | ICD-10-CM | POA: Diagnosis not present

## 2014-10-18 DIAGNOSIS — E039 Hypothyroidism, unspecified: Secondary | ICD-10-CM | POA: Diagnosis present

## 2014-10-18 DIAGNOSIS — J810 Acute pulmonary edema: Secondary | ICD-10-CM | POA: Diagnosis not present

## 2014-10-18 DIAGNOSIS — Z833 Family history of diabetes mellitus: Secondary | ICD-10-CM | POA: Diagnosis not present

## 2014-10-18 DIAGNOSIS — Z90710 Acquired absence of both cervix and uterus: Secondary | ICD-10-CM

## 2014-10-18 DIAGNOSIS — J9601 Acute respiratory failure with hypoxia: Secondary | ICD-10-CM | POA: Diagnosis not present

## 2014-10-18 DIAGNOSIS — R7989 Other specified abnormal findings of blood chemistry: Secondary | ICD-10-CM | POA: Diagnosis not present

## 2014-10-18 DIAGNOSIS — I248 Other forms of acute ischemic heart disease: Secondary | ICD-10-CM | POA: Diagnosis present

## 2014-10-18 DIAGNOSIS — I10 Essential (primary) hypertension: Secondary | ICD-10-CM | POA: Diagnosis present

## 2014-10-18 DIAGNOSIS — E785 Hyperlipidemia, unspecified: Secondary | ICD-10-CM | POA: Diagnosis present

## 2014-10-18 DIAGNOSIS — I5043 Acute on chronic combined systolic (congestive) and diastolic (congestive) heart failure: Secondary | ICD-10-CM | POA: Diagnosis not present

## 2014-10-18 DIAGNOSIS — Z8249 Family history of ischemic heart disease and other diseases of the circulatory system: Secondary | ICD-10-CM | POA: Diagnosis not present

## 2014-10-18 DIAGNOSIS — Z8679 Personal history of other diseases of the circulatory system: Secondary | ICD-10-CM

## 2014-10-18 DIAGNOSIS — I428 Other cardiomyopathies: Secondary | ICD-10-CM

## 2014-10-18 DIAGNOSIS — K219 Gastro-esophageal reflux disease without esophagitis: Secondary | ICD-10-CM | POA: Diagnosis present

## 2014-10-18 DIAGNOSIS — I447 Left bundle-branch block, unspecified: Secondary | ICD-10-CM | POA: Diagnosis not present

## 2014-10-18 LAB — CBC WITH DIFFERENTIAL/PLATELET
BASOS PCT: 0 % (ref 0–1)
Basophils Absolute: 0 10*3/uL (ref 0.0–0.1)
Eosinophils Absolute: 0.1 10*3/uL (ref 0.0–0.7)
Eosinophils Relative: 1 % (ref 0–5)
HEMATOCRIT: 42.5 % (ref 36.0–46.0)
Hemoglobin: 13.8 g/dL (ref 12.0–15.0)
Lymphocytes Relative: 38 % (ref 12–46)
Lymphs Abs: 3.8 10*3/uL (ref 0.7–4.0)
MCH: 32.1 pg (ref 26.0–34.0)
MCHC: 32.5 g/dL (ref 30.0–36.0)
MCV: 98.8 fL (ref 78.0–100.0)
Monocytes Absolute: 0.6 10*3/uL (ref 0.1–1.0)
Monocytes Relative: 6 % (ref 3–12)
Neutro Abs: 5.4 10*3/uL (ref 1.7–7.7)
Neutrophils Relative %: 55 % (ref 43–77)
Platelets: 186 10*3/uL (ref 150–400)
RBC: 4.3 MIL/uL (ref 3.87–5.11)
RDW: 13.6 % (ref 11.5–15.5)
WBC: 10 10*3/uL (ref 4.0–10.5)

## 2014-10-18 LAB — BASIC METABOLIC PANEL
Anion gap: 8 (ref 5–15)
BUN: 23 mg/dL — AB (ref 6–20)
CO2: 28 mmol/L (ref 22–32)
CREATININE: 0.67 mg/dL (ref 0.44–1.00)
Calcium: 9.3 mg/dL (ref 8.9–10.3)
Chloride: 104 mmol/L (ref 101–111)
Glucose, Bld: 167 mg/dL — ABNORMAL HIGH (ref 65–99)
Potassium: 3.9 mmol/L (ref 3.5–5.1)
Sodium: 140 mmol/L (ref 135–145)

## 2014-10-18 LAB — TROPONIN I
TROPONIN I: 0.06 ng/mL — AB (ref <0.031)
Troponin I: 0.03 ng/mL (ref <0.031)
Troponin I: 0.05 ng/mL — ABNORMAL HIGH (ref <0.031)
Troponin I: 0.05 ng/mL — ABNORMAL HIGH (ref <0.031)

## 2014-10-18 LAB — GLUCOSE, CAPILLARY
GLUCOSE-CAPILLARY: 104 mg/dL — AB (ref 65–99)
GLUCOSE-CAPILLARY: 140 mg/dL — AB (ref 65–99)
GLUCOSE-CAPILLARY: 109 mg/dL — AB (ref 65–99)
Glucose-Capillary: 179 mg/dL — ABNORMAL HIGH (ref 65–99)

## 2014-10-18 LAB — TSH: TSH: 10.726 u[IU]/mL — ABNORMAL HIGH (ref 0.350–4.500)

## 2014-10-18 LAB — BRAIN NATRIURETIC PEPTIDE: B Natriuretic Peptide: 672 pg/mL — ABNORMAL HIGH (ref 0.0–100.0)

## 2014-10-18 MED ORDER — INSULIN ASPART 100 UNIT/ML ~~LOC~~ SOLN
0.0000 [IU] | SUBCUTANEOUS | Status: DC
  Administered 2014-10-18: 1 [IU] via SUBCUTANEOUS

## 2014-10-18 MED ORDER — ASPIRIN EC 81 MG PO TBEC
81.0000 mg | DELAYED_RELEASE_TABLET | Freq: Every day | ORAL | Status: DC
  Administered 2014-10-18 – 2014-10-20 (×3): 81 mg via ORAL
  Filled 2014-10-18 (×3): qty 1

## 2014-10-18 MED ORDER — SODIUM CHLORIDE 0.9 % IJ SOLN
3.0000 mL | Freq: Two times a day (BID) | INTRAMUSCULAR | Status: DC
  Administered 2014-10-18 – 2014-10-20 (×3): 3 mL via INTRAVENOUS

## 2014-10-18 MED ORDER — FUROSEMIDE 10 MG/ML IJ SOLN
80.0000 mg | Freq: Once | INTRAMUSCULAR | Status: AC
  Administered 2014-10-18: 80 mg via INTRAVENOUS
  Filled 2014-10-18: qty 8

## 2014-10-18 MED ORDER — LEVOTHYROXINE SODIUM 88 MCG PO TABS
88.0000 ug | ORAL_TABLET | Freq: Every day | ORAL | Status: DC
  Administered 2014-10-19 – 2014-10-20 (×2): 88 ug via ORAL
  Filled 2014-10-18 (×2): qty 1

## 2014-10-18 MED ORDER — SODIUM CHLORIDE 0.9 % IJ SOLN: 3.0000 mL | INTRAMUSCULAR | Status: DC | PRN

## 2014-10-18 MED ORDER — POTASSIUM CHLORIDE 20 MEQ PO PACK
20.0000 meq | PACK | Freq: Two times a day (BID) | ORAL | Status: DC
  Administered 2014-10-18 (×2): 20 meq via ORAL
  Filled 2014-10-18 (×2): qty 1

## 2014-10-18 MED ORDER — SODIUM CHLORIDE 0.9 % IJ SOLN
3.0000 mL | Freq: Two times a day (BID) | INTRAMUSCULAR | Status: DC
  Administered 2014-10-18 – 2014-10-20 (×4): 3 mL via INTRAVENOUS

## 2014-10-18 MED ORDER — SIMVASTATIN 10 MG PO TABS: 40.0000 mg | ORAL_TABLET | Freq: Every day | ORAL | Status: DC

## 2014-10-18 MED ORDER — SPIRONOLACTONE 25 MG PO TABS
25.0000 mg | ORAL_TABLET | Freq: Every day | ORAL | Status: DC
  Administered 2014-10-18 – 2014-10-20 (×3): 25 mg via ORAL
  Filled 2014-10-18 (×3): qty 1

## 2014-10-18 MED ORDER — SODIUM CHLORIDE 0.9 % IV SOLN: 250.0000 mL | INTRAVENOUS | Status: DC | PRN

## 2014-10-18 MED ORDER — FUROSEMIDE 10 MG/ML IJ SOLN
40.0000 mg | Freq: Two times a day (BID) | INTRAMUSCULAR | Status: DC
  Administered 2014-10-18 – 2014-10-20 (×4): 40 mg via INTRAVENOUS
  Filled 2014-10-18 (×4): qty 4

## 2014-10-18 MED ORDER — CARVEDILOL 3.125 MG PO TABS
3.1250 mg | ORAL_TABLET | Freq: Two times a day (BID) | ORAL | Status: DC
  Administered 2014-10-18 – 2014-10-20 (×3): 3.125 mg via ORAL
  Filled 2014-10-18 (×4): qty 1

## 2014-10-18 MED ORDER — NITROGLYCERIN 2 % TD OINT
1.0000 [in_us] | TOPICAL_OINTMENT | Freq: Once | TRANSDERMAL | Status: AC
  Administered 2014-10-18: 1 [in_us] via TOPICAL
  Filled 2014-10-18: qty 1

## 2014-10-18 MED ORDER — FUROSEMIDE 10 MG/ML IJ SOLN: 80.0000 mg | Freq: Two times a day (BID) | INTRAMUSCULAR | Status: DC

## 2014-10-18 MED ORDER — INSULIN ASPART 100 UNIT/ML ~~LOC~~ SOLN: 0.0000 [IU] | Freq: Every day | SUBCUTANEOUS | Status: DC

## 2014-10-18 MED ORDER — LEVOTHYROXINE SODIUM 75 MCG PO TABS
75.0000 ug | ORAL_TABLET | Freq: Every day | ORAL | Status: DC
  Administered 2014-10-18: 75 ug via ORAL
  Filled 2014-10-18: qty 1

## 2014-10-18 MED ORDER — ATORVASTATIN CALCIUM 20 MG PO TABS
20.0000 mg | ORAL_TABLET | Freq: Every day | ORAL | Status: DC
  Administered 2014-10-18 – 2014-10-19 (×2): 20 mg via ORAL
  Filled 2014-10-18 (×2): qty 1

## 2014-10-18 MED ORDER — LOSARTAN POTASSIUM 50 MG PO TABS
100.0000 mg | ORAL_TABLET | Freq: Every day | ORAL | Status: DC
  Administered 2014-10-18 – 2014-10-20 (×3): 100 mg via ORAL
  Filled 2014-10-18 (×3): qty 2

## 2014-10-18 MED ORDER — INSULIN ASPART 100 UNIT/ML ~~LOC~~ SOLN
0.0000 [IU] | Freq: Three times a day (TID) | SUBCUTANEOUS | Status: DC
  Administered 2014-10-19 (×3): 1 [IU] via SUBCUTANEOUS
  Administered 2014-10-20 (×2): 2 [IU] via SUBCUTANEOUS

## 2014-10-18 MED ORDER — AMIODARONE HCL 200 MG PO TABS
100.0000 mg | ORAL_TABLET | Freq: Every day | ORAL | Status: DC
  Administered 2014-10-18 – 2014-10-20 (×3): 100 mg via ORAL
  Filled 2014-10-18 (×3): qty 1

## 2014-10-18 MED ORDER — HEPARIN SODIUM (PORCINE) 5000 UNIT/ML IJ SOLN
5000.0000 [IU] | Freq: Three times a day (TID) | INTRAMUSCULAR | Status: DC
  Administered 2014-10-18 – 2014-10-20 (×7): 5000 [IU] via SUBCUTANEOUS
  Filled 2014-10-18 (×7): qty 1

## 2014-10-18 MED ORDER — PANTOPRAZOLE SODIUM 40 MG PO TBEC
40.0000 mg | DELAYED_RELEASE_TABLET | Freq: Every day | ORAL | Status: DC
  Administered 2014-10-18 – 2014-10-20 (×3): 40 mg via ORAL
  Filled 2014-10-18 (×3): qty 1

## 2014-10-18 NOTE — H&P (Signed)
Hospitalist Admission History and Physical  Patient name: Donna [name] Medical record number: [id number] Date of birth: 1940/08/06 Age: 74 y.o. Gender: female  Primary Care Provider: Alonza Bogus, MD  Chief Complaint: acute resp failure w/ hypoxia, CHF exacerbation   History of Present Illness:This is a 74 y.o. year old female with significant past medical history of nonischemic cardiomyopathy, HTN, NIDDM, CVA, hx/o atrial flutter vs V tach on amiodarone presenting with acute resp failure w/ hypoxia, CHF exacerbation. Pt reports sudden onset of SOB last night. No CP. Mild feeling of smothering. No fevers or chills. Has been compliant w/ cardiac regimen. Denies any chronic orthopnea/PND.  Presented to the ER afebrile, hemodynamically stable. Satting in upper 80s on RA, improved to mid 90s on 2L [place]. CBC and CMET WNL. CXR Cardiac enlargement with pulmonary vascular congestion and edema. Progression since previous study. BNP 670. Trop WNL x1.    Assessment and Plan: Donna [name] is a 74 y.o. year old female presenting with acute resp failure w/ hypoxia, CHF exacerbation   Active Problems:   Acute respiratory failure with hypoxia   1- Acute resp failure w/ hypoxia -likely secondary to CHF exacerbation -IV lasix  -supplemental O2 -strict Is and Os  -daily weights -cycle CEs -hemodynamically stable at present- stable for tele bed  2-Nonischemic Cardiomyopathy -noted flare in setting of above -2D ECHO 07/2014 w/ EF 20%, diffuse hypokinesis -? ICD  -repeat 2D ECHO -cont home regimen -consider formal cards consult   3-hx/o atrial flutter vs. V tach -EKG sinus rhythm w/ LBBB -stable from last admission -cont amiodarone -tele bed  4-HLD -stable  -cont home regimen   5-Hypothyroidism -stable  -cont home regimen  -TSH  FEN/GI: heart healthy-carb modified diet. FLuid restricution  Prophylaxis: sub q heparin  Disposition: pending further evaluation  Code Status:Full  Code    Patient Active Problem List   Diagnosis Date Noted  . Acute on chronic respiratory failure with hypoxia 10/18/2014  . CHF exacerbation 07/22/2014  . Acute exacerbation of CHF (congestive heart failure) 07/22/2014  . Abnormality of gait 09/26/2013  . Weakness of left hip 09/26/2013  . Diabetes mellitus, type II 06/15/2011  . Cardiomyopathy, nonischemic   . Hypertension   . HYPOTHYROIDISM 05/28/2009  . LEFT BUNDLE BRANCH BLOCK 05/28/2009  . GASTROESOPHAGEAL REFLUX DISEASE 05/28/2009  . Hyperlipidemia 05/14/2008  . VENTRICULAR TACHYCARDIA 05/14/2008  . CEREBROVASCULAR DISEASE 05/14/2008   Past Medical History: Past Medical History  Diagnosis Date  . Cardiomyopathy, nonischemic     Nl cors in 1989 and 1998; CHF in 12/97; EF of 40% in 5/01 and 7/03, 25% in 9/04 and 4/08.  systolic murmur without valvular abnormalities by echo; refused automatic implantable cardiac defibrillator  . Left bundle branch block   . Hypertension   . Hyperlipidemia   . Diabetes mellitus, type II     No insulin  . Cerebrovascular disease     Duplex study in 12/2006 shows atherosclerosis without focal stenosis  . Atrial flutter     Versus ventricular tachycardia; treated with amiodarone  . Gastroesophageal reflux disease   . Ventricular tachycardia     a. PMH it lists "atrial flutter versus ventricular tachycardia treated with amiodarone" - details of this are unclear. Notes from back to 2006 indicate she has been maintained on amiodarone but do not describe further indication. Patient's sister reports pt was told she had skipped beats that could cause her to drop dead - Dr. Lattie Haw put her on amiodarone and recommended a  defibrillator.    Past Surgical History: Past Surgical History  Procedure Laterality Date  . Hernia repair      x2  . Abdominal hysterectomy  2006  . Colonoscopy  2008    Social History: History   Social History  . Marital Status: Widowed    Spouse Name: N/A  . Number  of Children: N/A  . Years of Education: N/A   Occupational History  . Retired    Social History Main Topics  . Smoking status: Never Smoker   . Smokeless tobacco: Never Used  . Alcohol Use: No  . Drug Use: Not on file  . Sexual Activity: Not on file   Other Topics Concern  . None   Social History Narrative   Lives in [place] with son and grandson   Physically active    Family History: Family History  Problem Relation Age of Onset  . Diabetes Mother   . Kidney disease Mother   . Diabetes Sister   . Heart attack Brother     Allergies: Allergies  Allergen Reactions  . Codeine Other (See Comments)    Reaction:  Racing heart   . Decongestant [Pseudoephedrine Hcl Er] Other (See Comments)    Reaction:  Racing heart     Current Facility-Administered Medications  Medication Dose Route Frequency Provider Last Rate Last Dose  . 0.9 %  sodium chloride infusion  250 mL Intravenous PRN Deneise Lever, MD      . amiodarone (PACERONE) tablet 100 mg  100 mg Oral Daily Deneise Lever, MD      . aspirin tablet 81 mg  81 mg Oral Daily Deneise Lever, MD      . atorvastatin (LIPITOR) tablet 20 mg  20 mg Oral q1800 Deneise Lever, MD      . carvedilol (COREG) tablet 3.125 mg  3.125 mg Oral BID Deneise Lever, MD      . furosemide (LASIX) injection 80 mg  80 mg Intravenous Q12H Deneise Lever, MD      . heparin injection 5,000 Units  5,000 Units Subcutaneous 3 times per day Deneise Lever, MD      . levothyroxine (SYNTHROID, LEVOTHROID) tablet 75 mcg  75 mcg Oral QAC breakfast Deneise Lever, MD      . losartan (COZAAR) tablet 100 mg  100 mg Oral Daily Deneise Lever, MD      . pantoprazole (PROTONIX) EC tablet 40 mg  40 mg Oral Daily Deneise Lever, MD      . potassium chloride (KLOR-CON) packet 20 mEq  20 mEq Oral BID Deneise Lever, MD      . simvastatin (ZOCOR) tablet 40 mg  40 mg Oral Daily Deneise Lever, MD      . sodium chloride 0.9 % injection 3 mL  3 mL  Intravenous Q12H Deneise Lever, MD      . sodium chloride 0.9 % injection 3 mL  3 mL Intravenous Q12H Deneise Lever, MD      . sodium chloride 0.9 % injection 3 mL  3 mL Intravenous PRN Deneise Lever, MD      . spironolactone (ALDACTONE) tablet 25 mg  25 mg Oral Daily Deneise Lever, MD       Current Outpatient Prescriptions  Medication Sig Dispense Refill  . amiodarone (PACERONE) 200 MG tablet Take 100 mg by mouth daily.    Marland Kitchen aspirin 81 MG tablet Take 81 mg by mouth  daily.    . atorvastatin (LIPITOR) 20 MG tablet Take 1 tablet (20 mg total) by mouth daily at 6 PM. 30 tablet 12  . calcium gluconate 500 MG tablet Take 500 mg by mouth 2 (two) times daily.      . carvedilol (COREG) 3.125 MG tablet Take 1 tablet (3.125 mg total) by mouth 2 (two) times daily. 60 tablet 6  . fish oil-omega-3 fatty acids 1000 MG capsule Take 1 capsule by mouth 3 (three) times daily.      . furosemide (LASIX) 40 MG tablet Take 20-40 mg by mouth 2 (two) times daily. Pt takes 1 tablet in the morning and 0.5 tablet at supper.    . levothyroxine (SYNTHROID, LEVOTHROID) 75 MCG tablet Take 75 mcg by mouth daily before breakfast.    . losartan (COZAAR) 100 MG tablet Take 100 mg by mouth daily.    Marland Kitchen omeprazole (PRILOSEC) 20 MG capsule Take 20 mg by mouth 2 (two) times daily.      . potassium chloride (KLOR-CON) 20 MEQ packet Take 20 mEq by mouth 2 (two) times daily.      . simvastatin (ZOCOR) 40 MG tablet Take 40 mg by mouth daily.    Marland Kitchen spironolactone (ALDACTONE) 25 MG tablet Take 25 mg by mouth daily.       Review Of Systems: 12 point ROS negative except as noted above in HPI.  Physical Exam: Filed Vitals:   10/18/14 0600  BP: 110/59  Pulse: 53  Temp:   Resp: 15    General: alert and cooperative HEENT: PERRLA and extra ocular movement intact Heart: S1, S2 normal, no murmur, rub or gallop, regular rate and rhythm Lungs: unlabored breathing and bibasilar rales Abdomen: abdomen is soft without significant  tenderness, masses, organomegaly or guarding Extremities: extremities normal, atraumatic, no cyanosis or edema Skin:no rashes Neurology: normal without focal findings  Labs and Imaging: Lab Results  Component Value Date/Time   NA 140 10/18/2014 03:05 AM   K 3.9 10/18/2014 03:05 AM   CL 104 10/18/2014 03:05 AM   CO2 28 10/18/2014 03:05 AM   BUN 23* 10/18/2014 03:05 AM   CREATININE 0.67 10/18/2014 03:05 AM   CREATININE 0.51 08/21/2014 10:12 AM   GLUCOSE 167* 10/18/2014 03:05 AM   Lab Results  Component Value Date   WBC 10.0 10/18/2014   HGB 13.8 10/18/2014   HCT 42.5 10/18/2014   MCV 98.8 10/18/2014   PLT 186 10/18/2014    Dg Chest Portable 1 View  10/18/2014   CLINICAL DATA:  History of CHF. Complains of congestion and chest pain. Shortness of breath.  EXAM: PORTABLE CHEST - 1 VIEW  COMPARISON:  07/22/2014  FINDINGS: Cardiac enlargement with pulmonary vascular congestion. Perihilar airspace and interstitial changes consistent with edema. Mild progression since previous study. No blunting of costophrenic angles. No pneumothorax. Calcification of the aorta.  IMPRESSION: Cardiac enlargement with pulmonary vascular congestion and edema. Progression since previous study.   Electronically Signed   By: Lucienne Capers M.D.   On: 10/18/2014 03:34           Shanda Howells MD  Pager: [phone]

## 2014-10-18 NOTE — ED Notes (Signed)
Attempted report 

## 2014-10-18 NOTE — Progress Notes (Signed)
She is in the hospital with respiratory failure from pulmonary edema. Her troponin had gone up and I discussed her situation with Dr. Percival Spanish and we agree this is not indicative of an MI but may be some demand ischemia from her pulmonary edema. This will be trended.

## 2014-10-18 NOTE — Progress Notes (Signed)
  Echocardiogram 2D Echocardiogram has been performed.  Lysle Rubens 10/18/2014, 3:35 PM

## 2014-10-18 NOTE — ED Notes (Signed)
   10/18/14 0611  Respiratory  Respiratory (WDL) X  L Breath Sounds Clear;Diminished  R Breath Sounds Diminished;Coarse crackles  Respiratory Pattern Dyspnea with exertion  Chest Assessment Chest expansion symmetrical  O2 Device Nasal Cannula  Patient states that she is better at this time. She looks better and her lungs sounds have improved. Patient resting in room without any acute distress noted.

## 2014-10-18 NOTE — ED Notes (Signed)
Pt with history of chf, states she became sob this am, audible wheezing with rales heard

## 2014-10-18 NOTE — ED Provider Notes (Signed)
CSN: [id number]     Arrival date & time 10/18/14  0250 History   First MD Initiated Contact with Patient 10/18/14 [phone]     Chief Complaint  Patient presents with  . Shortness of Breath     (Consider location/radiation/quality/duration/timing/severity/associated sxs/prior Treatment) HPI  This is a 74 year old female with a history of nonischemic cardiomyopathy, hypertension, hyper lipidemia, diabetes, atrial flutter who presents with shortness of breath. Onset of symptoms at 2 AM this morning. Symptoms have progressively worsened and patient is dyspneic at this time. Patient's family is at bedside and states she has become more wheezy. No fevers or cough. Denies any chest pain. She is not on any home oxygen. Currently she is on 4 L satting 91%. She does take Lasix and spironolactone at home. Denies any lower extremity swelling.  Past Medical History  Diagnosis Date  . Cardiomyopathy, nonischemic     Nl cors in 1989 and 1998; CHF in 12/97; EF of 40% in 5/01 and 7/03, 25% in 9/04 and 4/08.  systolic murmur without valvular abnormalities by echo; refused automatic implantable cardiac defibrillator  . Left bundle branch block   . Hypertension   . Hyperlipidemia   . Diabetes mellitus, type II     No insulin  . Cerebrovascular disease     Duplex study in 12/2006 shows atherosclerosis without focal stenosis  . Atrial flutter     Versus ventricular tachycardia; treated with amiodarone  . Gastroesophageal reflux disease   . Ventricular tachycardia     a. PMH it lists "atrial flutter versus ventricular tachycardia treated with amiodarone" - details of this are unclear. Notes from back to 2006 indicate she has been maintained on amiodarone but do not describe further indication. Patient's sister reports pt was told she had skipped beats that could cause her to drop dead - Dr. Lattie Haw put her on amiodarone and recommended a defibrillator.   Past Surgical History  Procedure Laterality Date  .  Hernia repair      x2  . Abdominal hysterectomy  2006  . Colonoscopy  2008   Family History  Problem Relation Age of Onset  . Diabetes Mother   . Kidney disease Mother   . Diabetes Sister   . Heart attack Brother    History  Substance Use Topics  . Smoking status: Never Smoker   . Smokeless tobacco: Never Used  . Alcohol Use: No   OB History    No data available     Review of Systems  Constitutional: Negative for fever.  Respiratory: Positive for shortness of breath and wheezing. Negative for cough and chest tightness.   Cardiovascular: Negative for chest pain and leg swelling.  Gastrointestinal: Negative for nausea, vomiting and abdominal pain.  Genitourinary: Negative for dysuria.  Musculoskeletal: Negative for back pain.  Neurological: Negative for headaches.  Psychiatric/Behavioral: Negative for confusion.  All other systems reviewed and are negative.     Allergies  Codeine and Decongestant  Home Medications   Prior to Admission medications   Medication Sig Start Date End Date Taking? Authorizing Provider  amiodarone (PACERONE) 200 MG tablet Take 100 mg by mouth daily.    Historical Provider, MD  aspirin 81 MG tablet Take 81 mg by mouth daily.    Historical Provider, MD  atorvastatin (LIPITOR) 20 MG tablet Take 1 tablet (20 mg total) by mouth daily at 6 PM. 07/24/14   Sinda Du, MD  calcium gluconate 500 MG tablet Take 500 mg by mouth 2 (two) times daily.  Historical Provider, MD  carvedilol (COREG) 3.125 MG tablet Take 1 tablet (3.125 mg total) by mouth 2 (two) times daily. 06/25/14   Arnoldo Lenis, MD  fish oil-omega-3 fatty acids 1000 MG capsule Take 1 capsule by mouth 3 (three) times daily.      Historical Provider, MD  furosemide (LASIX) 40 MG tablet Take 20-40 mg by mouth 2 (two) times daily. Pt takes 1 tablet in the morning and 0.5 tablet at supper.    Historical Provider, MD  levothyroxine (SYNTHROID, LEVOTHROID) 75 MCG tablet Take 75 mcg by  mouth daily before breakfast.    Historical Provider, MD  losartan (COZAAR) 100 MG tablet Take 100 mg by mouth daily.    Historical Provider, MD  omeprazole (PRILOSEC) 20 MG capsule Take 20 mg by mouth 2 (two) times daily.      Historical Provider, MD  potassium chloride (KLOR-CON) 20 MEQ packet Take 20 mEq by mouth 2 (two) times daily.      Historical Provider, MD  simvastatin (ZOCOR) 40 MG tablet Take 40 mg by mouth daily.    Historical Provider, MD  spironolactone (ALDACTONE) 25 MG tablet Take 25 mg by mouth daily.      Historical Provider, MD   BP 104/61 mmHg  Pulse 62  Temp(Src) 98.1 F (36.7 C) (Oral)  Resp 17  Ht 4\' 11"  (1.499 m)  Wt 105 lb (47.628 kg)  BMI 21.20 kg/m2  SpO2 98% Physical Exam  Constitutional: She is oriented to person, place, and time. No distress.  Elderly  HENT:  Head: Normocephalic and atraumatic.  Eyes: Pupils are equal, round, and reactive to light.  Cardiovascular: Normal rate, regular rhythm and normal heart sounds.   No murmur heard. Pulmonary/Chest: Effort normal. No respiratory distress. She has wheezes.  Diffuse crackles, fair air movement, mild tachypnea  Abdominal: Soft. Bowel sounds are normal. There is no tenderness. There is no rebound.  Musculoskeletal:  Trace bilateral lower extremity edema  Neurological: She is alert and oriented to person, place, and time.  Skin: Skin is warm and dry.  Psychiatric: She has a normal mood and affect.  Nursing note and vitals reviewed.   ED Course  Procedures (including critical care time) Labs Review Labs Reviewed  BASIC METABOLIC PANEL - Abnormal; Notable for the following:    Glucose, Bld 167 (*)    All other components within normal limits  BRAIN NATRIURETIC PEPTIDE - Abnormal; Notable for the following:    B Natriuretic Peptide 672.0 (*)    All other components within normal limits  CBC WITH DIFFERENTIAL/PLATELET  TROPONIN I  I-STAT CHEM 8, ED    Imaging Review Dg Chest Portable 1  View  10/18/2014   CLINICAL DATA:  History of CHF. Complains of congestion and chest pain. Shortness of breath.  EXAM: PORTABLE CHEST - 1 VIEW  COMPARISON:  07/22/2014  FINDINGS: Cardiac enlargement with pulmonary vascular congestion. Perihilar airspace and interstitial changes consistent with edema. Mild progression since previous study. No blunting of costophrenic angles. No pneumothorax. Calcification of the aorta.  IMPRESSION: Cardiac enlargement with pulmonary vascular congestion and edema. Progression since previous study.   Electronically Signed   By: Lucienne Capers M.D.   On: 10/18/2014 03:34     EKG Interpretation   Date/Time:  Saturday Oct 18 2014 03:06:00 EDT Ventricular Rate:  96 PR Interval:  59 QRS Duration: 189 QT Interval:  434 QTC Calculation: 548 R Axis:   -50 Text Interpretation:  Sinus rhythm Short PR interval Probable  left atrial  enlargement Left bundle branch block No significant change since last  tracing Confirmed by Khloee Garza  MD, Markeem Noreen ([id number]) on 10/18/2014 3:10:25 AM      MDM   Final diagnoses:  Acute pulmonary edema  History of CHF (congestive heart failure)    Patient presents with acute onset shortness of breath. Requiring 4 L oxygen to maintain oxygen saturations. History of cardiomyopathy and congestive heart failure. Wheezing and crackles noted on exam with mild respiratory distress Patient given 80 mg of IV Lasix and nitroglycerin paste was applied.  Chest x-ray shows pulmonary vascular congestion and edema. No signs of volume overload externally. BNP is 672.  On multiple rechecks, patient still has oxygen requirement but breath sounds are clearing up and she appears more comfortable. Will admit for diuresis.    Merryl Hacker, MD 10/18/14 [phone]

## 2014-10-19 DIAGNOSIS — J9601 Acute respiratory failure with hypoxia: Secondary | ICD-10-CM | POA: Diagnosis present

## 2014-10-19 DIAGNOSIS — I5023 Acute on chronic systolic (congestive) heart failure: Secondary | ICD-10-CM | POA: Diagnosis present

## 2014-10-19 DIAGNOSIS — I447 Left bundle-branch block, unspecified: Secondary | ICD-10-CM | POA: Diagnosis present

## 2014-10-19 DIAGNOSIS — I248 Other forms of acute ischemic heart disease: Secondary | ICD-10-CM | POA: Diagnosis present

## 2014-10-19 DIAGNOSIS — I5043 Acute on chronic combined systolic (congestive) and diastolic (congestive) heart failure: Secondary | ICD-10-CM | POA: Diagnosis not present

## 2014-10-19 DIAGNOSIS — K219 Gastro-esophageal reflux disease without esophagitis: Secondary | ICD-10-CM | POA: Diagnosis present

## 2014-10-19 DIAGNOSIS — R7989 Other specified abnormal findings of blood chemistry: Secondary | ICD-10-CM | POA: Diagnosis not present

## 2014-10-19 DIAGNOSIS — E785 Hyperlipidemia, unspecified: Secondary | ICD-10-CM | POA: Diagnosis present

## 2014-10-19 DIAGNOSIS — I10 Essential (primary) hypertension: Secondary | ICD-10-CM | POA: Diagnosis present

## 2014-10-19 DIAGNOSIS — Z8249 Family history of ischemic heart disease and other diseases of the circulatory system: Secondary | ICD-10-CM | POA: Diagnosis not present

## 2014-10-19 DIAGNOSIS — E119 Type 2 diabetes mellitus without complications: Secondary | ICD-10-CM | POA: Diagnosis present

## 2014-10-19 DIAGNOSIS — I429 Cardiomyopathy, unspecified: Secondary | ICD-10-CM | POA: Diagnosis present

## 2014-10-19 DIAGNOSIS — R0602 Shortness of breath: Secondary | ICD-10-CM | POA: Diagnosis present

## 2014-10-19 DIAGNOSIS — I509 Heart failure, unspecified: Secondary | ICD-10-CM | POA: Diagnosis not present

## 2014-10-19 DIAGNOSIS — E039 Hypothyroidism, unspecified: Secondary | ICD-10-CM | POA: Diagnosis present

## 2014-10-19 DIAGNOSIS — Z90710 Acquired absence of both cervix and uterus: Secondary | ICD-10-CM | POA: Diagnosis not present

## 2014-10-19 DIAGNOSIS — Z833 Family history of diabetes mellitus: Secondary | ICD-10-CM | POA: Diagnosis not present

## 2014-10-19 DIAGNOSIS — Z8673 Personal history of transient ischemic attack (TIA), and cerebral infarction without residual deficits: Secondary | ICD-10-CM | POA: Diagnosis not present

## 2014-10-19 LAB — CBC WITH DIFFERENTIAL/PLATELET
Basophils Absolute: 0 10*3/uL (ref 0.0–0.1)
Basophils Relative: 0 % (ref 0–1)
Eosinophils Absolute: 0.1 10*3/uL (ref 0.0–0.7)
Eosinophils Relative: 1 % (ref 0–5)
HCT: 37.8 % (ref 36.0–46.0)
HEMOGLOBIN: 12 g/dL (ref 12.0–15.0)
LYMPHS ABS: 1.8 10*3/uL (ref 0.7–4.0)
LYMPHS PCT: 26 % (ref 12–46)
MCH: 31.3 pg (ref 26.0–34.0)
MCHC: 31.7 g/dL (ref 30.0–36.0)
MCV: 98.7 fL (ref 78.0–100.0)
Monocytes Absolute: 0.6 10*3/uL (ref 0.1–1.0)
Monocytes Relative: 9 % (ref 3–12)
NEUTROS ABS: 4.4 10*3/uL (ref 1.7–7.7)
NEUTROS PCT: 64 % (ref 43–77)
Platelets: 161 10*3/uL (ref 150–400)
RBC: 3.83 MIL/uL — ABNORMAL LOW (ref 3.87–5.11)
RDW: 13.5 % (ref 11.5–15.5)
WBC: 6.8 10*3/uL (ref 4.0–10.5)

## 2014-10-19 LAB — COMPREHENSIVE METABOLIC PANEL
ALT: 20 U/L (ref 14–54)
ANION GAP: 7 (ref 5–15)
AST: 22 U/L (ref 15–41)
Albumin: 4.2 g/dL (ref 3.5–5.0)
Alkaline Phosphatase: 50 U/L (ref 38–126)
BILIRUBIN TOTAL: 0.6 mg/dL (ref 0.3–1.2)
BUN: 24 mg/dL — AB (ref 6–20)
CO2: 30 mmol/L (ref 22–32)
Calcium: 9 mg/dL (ref 8.9–10.3)
Chloride: 105 mmol/L (ref 101–111)
Creatinine, Ser: 0.6 mg/dL (ref 0.44–1.00)
GFR calc Af Amer: >60 mL/min (ref >60)
Glucose, Bld: 141 mg/dL — ABNORMAL HIGH (ref 65–99)
Potassium: 3.5 mmol/L (ref 3.5–5.1)
SODIUM: 142 mmol/L (ref 135–145)
Total Protein: 7 g/dL (ref 6.5–8.1)

## 2014-10-19 LAB — GLUCOSE, CAPILLARY
GLUCOSE-CAPILLARY: 148 mg/dL — AB (ref 65–99)
GLUCOSE-CAPILLARY: 127 mg/dL — AB (ref 65–99)
GLUCOSE-CAPILLARY: 146 mg/dL — AB (ref 65–99)
Glucose-Capillary: 150 mg/dL — ABNORMAL HIGH (ref 65–99)

## 2014-10-19 MED ORDER — POTASSIUM CHLORIDE CRYS ER 20 MEQ PO TBCR
40.0000 meq | EXTENDED_RELEASE_TABLET | Freq: Once | ORAL | Status: AC
  Administered 2014-10-19: 40 meq via ORAL
  Filled 2014-10-19: qty 2

## 2014-10-19 MED ORDER — POTASSIUM CHLORIDE 20 MEQ PO PACK
40.0000 meq | PACK | Freq: Two times a day (BID) | ORAL | Status: DC
  Administered 2014-10-19 – 2014-10-20 (×2): 40 meq via ORAL
  Filled 2014-10-19 (×2): qty 2

## 2014-10-19 MED ORDER — ACETAMINOPHEN 325 MG PO TABS
650.0000 mg | ORAL_TABLET | ORAL | Status: DC | PRN
  Administered 2014-10-19: 650 mg via ORAL

## 2014-10-19 NOTE — Progress Notes (Signed)
Subjective: She feels better. She is still short of breath and she is complaining of palpitations. She feels "tight in her chest but no actual chest pain  Objective: Vital signs in last 24 hours: Temp:  [97.5 F (36.4 C)-98.1 F (36.7 C)] 98 F (36.7 C) (05/15 0426) Pulse Rate:  [52-66] 64 (05/15 0426) Resp:  [16-18] 18 (05/15 0426) BP: (102-126)/(46-66) 122/61 mmHg (05/15 0426) SpO2:  [93 %-100 %] 100 % (05/15 0426) Weight:  [48.399 kg (106 lb 11.2 oz)] 48.399 kg (106 lb 11.2 oz) (05/15 0426) Weight change: 0.771 kg (1 lb 11.2 oz) Last BM Date: 10/18/14  Intake/Output from previous day: 05/14 0701 - 05/15 0700 In: 720 [P.O.:720] Out: 925 [Urine:925]  PHYSICAL EXAM General appearance: alert, cooperative, mild distress and Anxious Resp: clear to auscultation bilaterally Cardio: regular rate and rhythm, S1, S2 normal, no murmur, click, rub or gallop GI: soft, non-tender; bowel sounds normal; no masses,  no organomegaly Extremities: extremities normal, atraumatic, no cyanosis or edema  Lab Results:  Results for orders placed or performed during the hospital encounter of 10/18/14 (from the past 48 hour(s))  CBC with Differential     Status: None   Collection Time: 10/18/14  3:05 AM  Result Value Ref Range   WBC 10.0 4.0 - 10.5 K/uL   RBC 4.30 3.87 - 5.11 MIL/uL   Hemoglobin 13.8 12.0 - 15.0 g/dL   HCT 42.5 36.0 - 46.0 %   MCV 98.8 78.0 - 100.0 fL   MCH 32.1 26.0 - 34.0 pg   MCHC 32.5 30.0 - 36.0 g/dL   RDW 13.6 11.5 - 15.5 %   Platelets 186 150 - 400 K/uL   Neutrophils Relative % 55 43 - 77 %   Neutro Abs 5.4 1.7 - 7.7 K/uL   Lymphocytes Relative 38 12 - 46 %   Lymphs Abs 3.8 0.7 - 4.0 K/uL   Monocytes Relative 6 3 - 12 %   Monocytes Absolute 0.6 0.1 - 1.0 K/uL   Eosinophils Relative 1 0 - 5 %   Eosinophils Absolute 0.1 0.0 - 0.7 K/uL   Basophils Relative 0 0 - 1 %   Basophils Absolute 0.0 0.0 - 0.1 K/uL  Basic metabolic panel     Status: Abnormal   Collection Time:  10/18/14  3:05 AM  Result Value Ref Range   Sodium 140 135 - 145 mmol/L   Potassium 3.9 3.5 - 5.1 mmol/L   Chloride 104 101 - 111 mmol/L   CO2 28 22 - 32 mmol/L   Glucose, Bld 167 (H) 65 - 99 mg/dL   BUN 23 (H) 6 - 20 mg/dL   Creatinine, Ser 0.67 0.44 - 1.00 mg/dL   Calcium 9.3 8.9 - 10.3 mg/dL   GFR calc non Af Amer >60 >60 mL/min   GFR calc Af Amer >60 >60 mL/min    Comment: (NOTE) The eGFR has been calculated using the CKD EPI equation. This calculation has not been validated in all clinical situations. eGFR's persistently <60 mL/min signify possible Chronic Kidney Disease.    Anion gap 8 5 - 15  Brain natriuretic peptide     Status: Abnormal   Collection Time: 10/18/14  3:05 AM  Result Value Ref Range   B Natriuretic Peptide 672.0 (H) 0.0 - 100.0 pg/mL  Troponin I     Status: None   Collection Time: 10/18/14  3:05 AM  Result Value Ref Range   Troponin I 0.03 <0.031 ng/mL    Comment:  NO INDICATION OF MYOCARDIAL INJURY.   TSH     Status: Abnormal   Collection Time: 10/18/14  3:05 AM  Result Value Ref Range   TSH 10.726 (H) 0.350 - 4.500 uIU/mL  Troponin I     Status: Abnormal   Collection Time: 10/18/14  6:59 AM  Result Value Ref Range   Troponin I 0.05 (H) <0.031 ng/mL    Comment:        PERSISTENTLY INCREASED TROPONIN VALUES IN THE RANGE OF 0.04-0.49 ng/mL CAN BE SEEN IN:       -UNSTABLE ANGINA       -CONGESTIVE HEART FAILURE       -MYOCARDITIS       -CHEST TRAUMA       -ARRYHTHMIAS       -LATE PRESENTING MYOCARDIAL INFARCTION       -COPD   CLINICAL FOLLOW-UP RECOMMENDED.   Glucose, capillary     Status: Abnormal   Collection Time: 10/18/14  8:51 AM  Result Value Ref Range   Glucose-Capillary 104 (H) 65 - 99 mg/dL   Comment 1 Notify RN   Glucose, capillary     Status: Abnormal   Collection Time: 10/18/14 11:34 AM  Result Value Ref Range   Glucose-Capillary 140 (H) 65 - 99 mg/dL   Comment 1 Notify RN   Troponin I     Status: Abnormal    Collection Time: 10/18/14 12:19 PM  Result Value Ref Range   Troponin I 0.06 (H) <0.031 ng/mL    Comment:        PERSISTENTLY INCREASED TROPONIN VALUES IN THE RANGE OF 0.04-0.49 ng/mL CAN BE SEEN IN:       -UNSTABLE ANGINA       -CONGESTIVE HEART FAILURE       -MYOCARDITIS       -CHEST TRAUMA       -ARRYHTHMIAS       -LATE PRESENTING MYOCARDIAL INFARCTION       -COPD   CLINICAL FOLLOW-UP RECOMMENDED.   Glucose, capillary     Status: Abnormal   Collection Time: 10/18/14  4:50 PM  Result Value Ref Range   Glucose-Capillary 109 (H) 65 - 99 mg/dL   Comment 1 Notify RN   Troponin I     Status: Abnormal   Collection Time: 10/18/14  6:15 PM  Result Value Ref Range   Troponin I 0.05 (H) <0.031 ng/mL    Comment:        PERSISTENTLY INCREASED TROPONIN VALUES IN THE RANGE OF 0.04-0.49 ng/mL CAN BE SEEN IN:       -UNSTABLE ANGINA       -CONGESTIVE HEART FAILURE       -MYOCARDITIS       -CHEST TRAUMA       -ARRYHTHMIAS       -LATE PRESENTING MYOCARDIAL INFARCTION       -COPD   CLINICAL FOLLOW-UP RECOMMENDED.   Glucose, capillary     Status: Abnormal   Collection Time: 10/18/14  8:51 PM  Result Value Ref Range   Glucose-Capillary 179 (H) 65 - 99 mg/dL   Comment 1 Notify RN    Comment 2 Document in Chart   Comprehensive metabolic panel     Status: Abnormal   Collection Time: 10/19/14  6:07 AM  Result Value Ref Range   Sodium 142 135 - 145 mmol/L   Potassium 3.5 3.5 - 5.1 mmol/L   Chloride 105 101 - 111 mmol/L   CO2 30 22 - 32  mmol/L   Glucose, Bld 141 (H) 65 - 99 mg/dL   BUN 24 (H) 6 - 20 mg/dL   Creatinine, Ser 0.60 0.44 - 1.00 mg/dL   Calcium 9.0 8.9 - 10.3 mg/dL   Total Protein 7.0 6.5 - 8.1 g/dL   Albumin 4.2 3.5 - 5.0 g/dL   AST 22 15 - 41 U/L   ALT 20 14 - 54 U/L   Alkaline Phosphatase 50 38 - 126 U/L   Total Bilirubin 0.6 0.3 - 1.2 mg/dL   GFR calc non Af Amer >60 >60 mL/min   GFR calc Af Amer >60 >60 mL/min    Comment: (NOTE) The eGFR has been calculated using  the CKD EPI equation. This calculation has not been validated in all clinical situations. eGFR's persistently <60 mL/min signify possible Chronic Kidney Disease.    Anion gap 7 5 - 15  CBC WITH DIFFERENTIAL     Status: Abnormal   Collection Time: 10/19/14  6:07 AM  Result Value Ref Range   WBC 6.8 4.0 - 10.5 K/uL   RBC 3.83 (L) 3.87 - 5.11 MIL/uL   Hemoglobin 12.0 12.0 - 15.0 g/dL   HCT 37.8 36.0 - 46.0 %   MCV 98.7 78.0 - 100.0 fL   MCH 31.3 26.0 - 34.0 pg   MCHC 31.7 30.0 - 36.0 g/dL   RDW 13.5 11.5 - 15.5 %   Platelets 161 150 - 400 K/uL   Neutrophils Relative % 64 43 - 77 %   Neutro Abs 4.4 1.7 - 7.7 K/uL   Lymphocytes Relative 26 12 - 46 %   Lymphs Abs 1.8 0.7 - 4.0 K/uL   Monocytes Relative 9 3 - 12 %   Monocytes Absolute 0.6 0.1 - 1.0 K/uL   Eosinophils Relative 1 0 - 5 %   Eosinophils Absolute 0.1 0.0 - 0.7 K/uL   Basophils Relative 0 0 - 1 %   Basophils Absolute 0.0 0.0 - 0.1 K/uL  Glucose, capillary     Status: Abnormal   Collection Time: 10/19/14  7:39 AM  Result Value Ref Range   Glucose-Capillary 148 (H) 65 - 99 mg/dL   Comment 1 Notify RN     ABGS No results for input(s): PHART, PO2ART, TCO2, HCO3 in the last 72 hours.  Invalid input(s): PCO2 CULTURES No results found for this or any previous visit (from the past 240 hour(s)). Studies/Results: Dg Chest Portable 1 View  10/18/2014   CLINICAL DATA:  History of CHF. Complains of congestion and chest pain. Shortness of breath.  EXAM: PORTABLE CHEST - 1 VIEW  COMPARISON:  07/22/2014  FINDINGS: Cardiac enlargement with pulmonary vascular congestion. Perihilar airspace and interstitial changes consistent with edema. Mild progression since previous study. No blunting of costophrenic angles. No pneumothorax. Calcification of the aorta.  IMPRESSION: Cardiac enlargement with pulmonary vascular congestion and edema. Progression since previous study.   Electronically Signed   By: Lucienne Capers M.D.   On: 10/18/2014  03:34    Medications:  Prior to Admission:  Prescriptions prior to admission  Medication Sig Dispense Refill Last Dose  . amiodarone (PACERONE) 200 MG tablet Take 100 mg by mouth daily.   Taking  . aspirin 81 MG tablet Take 81 mg by mouth daily.   Taking  . atorvastatin (LIPITOR) 20 MG tablet Take 1 tablet (20 mg total) by mouth daily at 6 PM. 30 tablet 12 Taking  . calcium gluconate 500 MG tablet Take 500 mg by mouth 2 (two) times  daily.     Taking  . carvedilol (COREG) 3.125 MG tablet Take 1 tablet (3.125 mg total) by mouth 2 (two) times daily. 60 tablet 6 Taking  . fish oil-omega-3 fatty acids 1000 MG capsule Take 1 capsule by mouth 3 (three) times daily.     Taking  . furosemide (LASIX) 40 MG tablet Take 20-40 mg by mouth 2 (two) times daily. Pt takes 1 tablet in the morning and 0.5 tablet at supper.   Taking  . levothyroxine (SYNTHROID, LEVOTHROID) 75 MCG tablet Take 75 mcg by mouth daily before breakfast.   Taking  . losartan (COZAAR) 100 MG tablet Take 100 mg by mouth daily.   Taking  . omeprazole (PRILOSEC) 20 MG capsule Take 20 mg by mouth 2 (two) times daily.     Taking  . potassium chloride (KLOR-CON) 20 MEQ packet Take 20 mEq by mouth 2 (two) times daily.     Taking  . simvastatin (ZOCOR) 40 MG tablet Take 40 mg by mouth daily.   Taking  . spironolactone (ALDACTONE) 25 MG tablet Take 25 mg by mouth daily.     Taking   Scheduled: . amiodarone  100 mg Oral Daily  . aspirin EC  81 mg Oral Daily  . atorvastatin  20 mg Oral q1800  . carvedilol  3.125 mg Oral BID  . furosemide  40 mg Intravenous Q12H  . heparin  5,000 Units Subcutaneous 3 times per day  . insulin aspart  0-5 Units Subcutaneous QHS  . insulin aspart  0-9 Units Subcutaneous TID WC  . levothyroxine  88 mcg Oral QAC breakfast  . losartan  100 mg Oral Daily  . pantoprazole  40 mg Oral Daily  . potassium chloride  40 mEq Oral BID  . sodium chloride  3 mL Intravenous Q12H  . sodium chloride  3 mL Intravenous Q12H   . spironolactone  25 mg Oral Daily   Continuous:  RFF:MBWGYK chloride, sodium chloride  Assesment: She was admitted with congestive heart failure and acute respiratory failure with hypoxia. She is better but still complaining of shortness of breath. She has palpitations and I think that may be related to her potassium being down to 3.5. Active Problems:   Acute respiratory failure with hypoxia   CHF (congestive heart failure)    Plan: She will continue current treatments and increased doses of potassium recheck her basic metabolic profile in the morning and also check magnesium level in the morning. I will request cardiology consultation to see if there is anything else we can do about her severe systolic heart failure    LOS: 0 days   Babak Lucus L 10/19/2014, 8:49 AM

## 2014-10-19 NOTE — Progress Notes (Signed)
Utilization review Completed Tayvon Culley RN BSN   

## 2014-10-20 ENCOUNTER — Encounter: Payer: Self-pay | Admitting: *Deleted

## 2014-10-20 DIAGNOSIS — R7989 Other specified abnormal findings of blood chemistry: Secondary | ICD-10-CM

## 2014-10-20 DIAGNOSIS — I5043 Acute on chronic combined systolic (congestive) and diastolic (congestive) heart failure: Secondary | ICD-10-CM

## 2014-10-20 DIAGNOSIS — I5023 Acute on chronic systolic (congestive) heart failure: Secondary | ICD-10-CM | POA: Diagnosis present

## 2014-10-20 LAB — CBC WITH DIFFERENTIAL/PLATELET
BASOS ABS: 0.1 10*3/uL (ref 0.0–0.1)
Basophils Relative: 1 % (ref 0–1)
Eosinophils Absolute: 0.1 10*3/uL (ref 0.0–0.7)
Eosinophils Relative: 2 % (ref 0–5)
HEMATOCRIT: 39 % (ref 36.0–46.0)
HEMOGLOBIN: 12.6 g/dL (ref 12.0–15.0)
LYMPHS ABS: 1.6 10*3/uL (ref 0.7–4.0)
Lymphocytes Relative: 29 % (ref 12–46)
MCH: 31.8 pg (ref 26.0–34.0)
MCHC: 32.3 g/dL (ref 30.0–36.0)
MCV: 98.5 fL (ref 78.0–100.0)
MONO ABS: 0.6 10*3/uL (ref 0.1–1.0)
Monocytes Relative: 11 % (ref 3–12)
NEUTROS ABS: 3.3 10*3/uL (ref 1.7–7.7)
Neutrophils Relative %: 57 % (ref 43–77)
Platelets: 164 10*3/uL (ref 150–400)
RBC: 3.96 MIL/uL (ref 3.87–5.11)
RDW: 13.4 % (ref 11.5–15.5)
WBC: 5.6 10*3/uL (ref 4.0–10.5)

## 2014-10-20 LAB — T4, FREE: Free T4: 1.08 ng/dL (ref 0.61–1.12)

## 2014-10-20 LAB — COMPREHENSIVE METABOLIC PANEL
ALT: 20 U/L (ref 14–54)
ANION GAP: 9 (ref 5–15)
AST: 19 U/L (ref 15–41)
Albumin: 4.2 g/dL (ref 3.5–5.0)
Alkaline Phosphatase: 48 U/L (ref 38–126)
BILIRUBIN TOTAL: 0.9 mg/dL (ref 0.3–1.2)
BUN: 25 mg/dL — AB (ref 6–20)
CO2: 27 mmol/L (ref 22–32)
CREATININE: 0.49 mg/dL (ref 0.44–1.00)
Calcium: 9.3 mg/dL (ref 8.9–10.3)
Chloride: 104 mmol/L (ref 101–111)
GFR calc non Af Amer: >60 mL/min (ref >60)
GLUCOSE: 159 mg/dL — AB (ref 65–99)
Potassium: 4 mmol/L (ref 3.5–5.1)
Sodium: 140 mmol/L (ref 135–145)
Total Protein: 7 g/dL (ref 6.5–8.1)

## 2014-10-20 LAB — GLUCOSE, CAPILLARY
GLUCOSE-CAPILLARY: 189 mg/dL — AB (ref 65–99)
Glucose-Capillary: 154 mg/dL — ABNORMAL HIGH (ref 65–99)

## 2014-10-20 LAB — MAGNESIUM: Magnesium: 2.1 mg/dL (ref 1.7–2.4)

## 2014-10-20 LAB — HEMOGLOBIN A1C
HEMOGLOBIN A1C: 6.3 % — AB (ref 4.8–5.6)
Mean Plasma Glucose: 134 mg/dL

## 2014-10-20 MED ORDER — FUROSEMIDE 40 MG PO TABS
40.0000 mg | ORAL_TABLET | Freq: Every day | ORAL | Status: DC
  Administered 2014-10-20: 40 mg via ORAL
  Filled 2014-10-20: qty 1

## 2014-10-20 MED ORDER — ONDANSETRON HCL 4 MG PO TABS: 4.0000 mg | ORAL_TABLET | Freq: Three times a day (TID) | ORAL | Status: DC | PRN

## 2014-10-20 NOTE — Progress Notes (Signed)
Patient discharged with instructions, prescription, and care notes.  Verbalized understanding via teach back.  IV was removed and the site was WNL. Patient voiced no further complaints or concerns at the time of discharge.  Appointments scheduled per instructions.  Patient left the floor via w/c with staff and family in stable condition. 

## 2014-10-20 NOTE — Progress Notes (Signed)
Subjective: She says she has some nausea this morning. She has no other new complaints. Her breathing is better.  Objective: Vital signs in last 24 hours: Temp:  [97.4 F (36.3 C)-98.7 F (37.1 C)] 97.4 F (36.3 C) (05/16 0533) Pulse Rate:  [54-72] 72 (05/16 0533) Resp:  [18-20] 20 (05/16 0533) BP: (120-125)/(58-70) 125/70 mmHg (05/16 0533) SpO2:  [98 %-100 %] 100 % (05/16 0533) Weight:  [47.537 kg (104 lb 12.8 oz)] 47.537 kg (104 lb 12.8 oz) (05/16 0533) Weight change: -0.862 kg (-1 lb 14.4 oz) Last BM Date: 10/18/14  Intake/Output from previous day: 05/15 0701 - 05/16 0700 In: 584 [P.O.:584] Out: 2100 [Urine:2100]  PHYSICAL EXAM General appearance: alert, cooperative and no distress Resp: clear to auscultation bilaterally Cardio: regular rate and rhythm, S1, S2 normal, no murmur, click, rub or gallop GI: soft, non-tender; bowel sounds normal; no masses,  no organomegaly Extremities: extremities normal, atraumatic, no cyanosis or edema  Lab Results:  Results for orders placed or performed during the hospital encounter of 10/18/14 (from the past 48 hour(s))  Glucose, capillary     Status: Abnormal   Collection Time: 10/18/14  8:51 AM  Result Value Ref Range   Glucose-Capillary 104 (H) 65 - 99 mg/dL   Comment 1 Notify RN   Glucose, capillary     Status: Abnormal   Collection Time: 10/18/14 11:34 AM  Result Value Ref Range   Glucose-Capillary 140 (H) 65 - 99 mg/dL   Comment 1 Notify RN   Troponin I     Status: Abnormal   Collection Time: 10/18/14 12:19 PM  Result Value Ref Range   Troponin I 0.06 (H) <0.031 ng/mL    Comment:        PERSISTENTLY INCREASED TROPONIN VALUES IN THE RANGE OF 0.04-0.49 ng/mL CAN BE SEEN IN:       -UNSTABLE ANGINA       -CONGESTIVE HEART FAILURE       -MYOCARDITIS       -CHEST TRAUMA       -ARRYHTHMIAS       -LATE PRESENTING MYOCARDIAL INFARCTION       -COPD   CLINICAL FOLLOW-UP RECOMMENDED.   Glucose, capillary     Status: Abnormal    Collection Time: 10/18/14  4:50 PM  Result Value Ref Range   Glucose-Capillary 109 (H) 65 - 99 mg/dL   Comment 1 Notify RN   Troponin I     Status: Abnormal   Collection Time: 10/18/14  6:15 PM  Result Value Ref Range   Troponin I 0.05 (H) <0.031 ng/mL    Comment:        PERSISTENTLY INCREASED TROPONIN VALUES IN THE RANGE OF 0.04-0.49 ng/mL CAN BE SEEN IN:       -UNSTABLE ANGINA       -CONGESTIVE HEART FAILURE       -MYOCARDITIS       -CHEST TRAUMA       -ARRYHTHMIAS       -LATE PRESENTING MYOCARDIAL INFARCTION       -COPD   CLINICAL FOLLOW-UP RECOMMENDED.   Glucose, capillary     Status: Abnormal   Collection Time: 10/18/14  8:51 PM  Result Value Ref Range   Glucose-Capillary 179 (H) 65 - 99 mg/dL   Comment 1 Notify RN    Comment 2 Document in Chart   Comprehensive metabolic panel     Status: Abnormal   Collection Time: 10/19/14  6:07 AM  Result Value Ref Range  Sodium 142 135 - 145 mmol/L   Potassium 3.5 3.5 - 5.1 mmol/L   Chloride 105 101 - 111 mmol/L   CO2 30 22 - 32 mmol/L   Glucose, Bld 141 (H) 65 - 99 mg/dL   BUN 24 (H) 6 - 20 mg/dL   Creatinine, Ser 0.60 0.44 - 1.00 mg/dL   Calcium 9.0 8.9 - 10.3 mg/dL   Total Protein 7.0 6.5 - 8.1 g/dL   Albumin 4.2 3.5 - 5.0 g/dL   AST 22 15 - 41 U/L   ALT 20 14 - 54 U/L   Alkaline Phosphatase 50 38 - 126 U/L   Total Bilirubin 0.6 0.3 - 1.2 mg/dL   GFR calc non Af Amer >60 >60 mL/min   GFR calc Af Amer >60 >60 mL/min    Comment: (NOTE) The eGFR has been calculated using the CKD EPI equation. This calculation has not been validated in all clinical situations. eGFR's persistently <60 mL/min signify possible Chronic Kidney Disease.    Anion gap 7 5 - 15  CBC WITH DIFFERENTIAL     Status: Abnormal   Collection Time: 10/19/14  6:07 AM  Result Value Ref Range   WBC 6.8 4.0 - 10.5 K/uL   RBC 3.83 (L) 3.87 - 5.11 MIL/uL   Hemoglobin 12.0 12.0 - 15.0 g/dL   HCT 37.8 36.0 - 46.0 %   MCV 98.7 78.0 - 100.0 fL   MCH 31.3  26.0 - 34.0 pg   MCHC 31.7 30.0 - 36.0 g/dL   RDW 13.5 11.5 - 15.5 %   Platelets 161 150 - 400 K/uL   Neutrophils Relative % 64 43 - 77 %   Neutro Abs 4.4 1.7 - 7.7 K/uL   Lymphocytes Relative 26 12 - 46 %   Lymphs Abs 1.8 0.7 - 4.0 K/uL   Monocytes Relative 9 3 - 12 %   Monocytes Absolute 0.6 0.1 - 1.0 K/uL   Eosinophils Relative 1 0 - 5 %   Eosinophils Absolute 0.1 0.0 - 0.7 K/uL   Basophils Relative 0 0 - 1 %   Basophils Absolute 0.0 0.0 - 0.1 K/uL  Glucose, capillary     Status: Abnormal   Collection Time: 10/19/14  7:39 AM  Result Value Ref Range   Glucose-Capillary 148 (H) 65 - 99 mg/dL   Comment 1 Notify RN   Glucose, capillary     Status: Abnormal   Collection Time: 10/19/14 11:35 AM  Result Value Ref Range   Glucose-Capillary 150 (H) 65 - 99 mg/dL   Comment 1 Notify RN   Glucose, capillary     Status: Abnormal   Collection Time: 10/19/14  4:49 PM  Result Value Ref Range   Glucose-Capillary 127 (H) 65 - 99 mg/dL  Glucose, capillary     Status: Abnormal   Collection Time: 10/19/14  8:10 PM  Result Value Ref Range   Glucose-Capillary 146 (H) 65 - 99 mg/dL  Comprehensive metabolic panel     Status: Abnormal   Collection Time: 10/20/14  5:56 AM  Result Value Ref Range   Sodium 140 135 - 145 mmol/L   Potassium 4.0 3.5 - 5.1 mmol/L   Chloride 104 101 - 111 mmol/L   CO2 27 22 - 32 mmol/L   Glucose, Bld 159 (H) 65 - 99 mg/dL   BUN 25 (H) 6 - 20 mg/dL   Creatinine, Ser 0.49 0.44 - 1.00 mg/dL   Calcium 9.3 8.9 - 10.3 mg/dL   Total Protein 7.0  6.5 - 8.1 g/dL   Albumin 4.2 3.5 - 5.0 g/dL   AST 19 15 - 41 U/L   ALT 20 14 - 54 U/L   Alkaline Phosphatase 48 38 - 126 U/L   Total Bilirubin 0.9 0.3 - 1.2 mg/dL   GFR calc non Af Amer >60 >60 mL/min   GFR calc Af Amer >60 >60 mL/min    Comment: (NOTE) The eGFR has been calculated using the CKD EPI equation. This calculation has not been validated in all clinical situations. eGFR's persistently <60 mL/min signify possible  Chronic Kidney Disease.    Anion gap 9 5 - 15  CBC WITH DIFFERENTIAL     Status: None   Collection Time: 10/20/14  5:56 AM  Result Value Ref Range   WBC 5.6 4.0 - 10.5 K/uL   RBC 3.96 3.87 - 5.11 MIL/uL   Hemoglobin 12.6 12.0 - 15.0 g/dL   HCT 39.0 36.0 - 46.0 %   MCV 98.5 78.0 - 100.0 fL   MCH 31.8 26.0 - 34.0 pg   MCHC 32.3 30.0 - 36.0 g/dL   RDW 13.4 11.5 - 15.5 %   Platelets 164 150 - 400 K/uL   Neutrophils Relative % 57 43 - 77 %   Neutro Abs 3.3 1.7 - 7.7 K/uL   Lymphocytes Relative 29 12 - 46 %   Lymphs Abs 1.6 0.7 - 4.0 K/uL   Monocytes Relative 11 3 - 12 %   Monocytes Absolute 0.6 0.1 - 1.0 K/uL   Eosinophils Relative 2 0 - 5 %   Eosinophils Absolute 0.1 0.0 - 0.7 K/uL   Basophils Relative 1 0 - 1 %   Basophils Absolute 0.1 0.0 - 0.1 K/uL  Magnesium     Status: None   Collection Time: 10/20/14  5:56 AM  Result Value Ref Range   Magnesium 2.1 1.7 - 2.4 mg/dL  Glucose, capillary     Status: Abnormal   Collection Time: 10/20/14  7:34 AM  Result Value Ref Range   Glucose-Capillary 154 (H) 65 - 99 mg/dL   Comment 1 Notify RN    Comment 2 Document in Chart     ABGS No results for input(s): PHART, PO2ART, TCO2, HCO3 in the last 72 hours.  Invalid input(s): PCO2 CULTURES No results found for this or any previous visit (from the past 240 hour(s)). Studies/Results: No results found.  Medications:  Prior to Admission:  Prescriptions prior to admission  Medication Sig Dispense Refill Last Dose  . amiodarone (PACERONE) 200 MG tablet Take 100 mg by mouth daily.   10/17/2014 at Unknown time  . aspirin 81 MG tablet Take 81 mg by mouth daily.   10/17/2014 at Unknown time  . atorvastatin (LIPITOR) 20 MG tablet Take 1 tablet (20 mg total) by mouth daily at 6 PM. 30 tablet 12 10/17/2014 at Unknown time  . calcium gluconate 500 MG tablet Take 500 mg by mouth 2 (two) times daily.     10/17/2014 at Unknown time  . carvedilol (COREG) 3.125 MG tablet Take 1 tablet (3.125 mg total)  by mouth 2 (two) times daily. 60 tablet 6 10/17/2014 at 1200  . fish oil-omega-3 fatty acids 1000 MG capsule Take 1 capsule by mouth 3 (three) times daily.     10/17/2014 at Unknown  . furosemide (LASIX) 40 MG tablet Take 20-40 mg by mouth 2 (two) times daily. Pt takes 1 tablet in the morning and 0.5 tablet at supper.   05/13/216 at Unknown  .  levothyroxine (SYNTHROID, LEVOTHROID) 75 MCG tablet Take 75 mcg by mouth daily before breakfast.   10/17/2014 at Unknown  . losartan (COZAAR) 100 MG tablet Take 100 mg by mouth daily.   10/17/2014 at Unknown  . omeprazole (PRILOSEC) 20 MG capsule Take 20 mg by mouth 2 (two) times daily.     10/17/2014 at Unknown  . potassium chloride (KLOR-CON) 20 MEQ packet Take 20 mEq by mouth 2 (two) times daily.     10/17/2014 at Unknown  . simvastatin (ZOCOR) 40 MG tablet Take 40 mg by mouth daily.   10/17/2014 at Unknown  . spironolactone (ALDACTONE) 25 MG tablet Take 25 mg by mouth daily.     10/17/2014 at Unknown   Scheduled: . amiodarone  100 mg Oral Daily  . aspirin EC  81 mg Oral Daily  . atorvastatin  20 mg Oral q1800  . carvedilol  3.125 mg Oral BID  . furosemide  40 mg Intravenous Q12H  . heparin  5,000 Units Subcutaneous 3 times per day  . insulin aspart  0-5 Units Subcutaneous QHS  . insulin aspart  0-9 Units Subcutaneous TID WC  . levothyroxine  88 mcg Oral QAC breakfast  . losartan  100 mg Oral Daily  . pantoprazole  40 mg Oral Daily  . potassium chloride  40 mEq Oral BID  . sodium chloride  3 mL Intravenous Q12H  . sodium chloride  3 mL Intravenous Q12H  . spironolactone  25 mg Oral Daily   Continuous:  WER:XVQMGQ chloride, acetaminophen, ondansetron, sodium chloride  Assesment: She was admitted with acute hypoxic respiratory failure that appears to be related to acute on chronic systolic heart failure. She is improved. She is complaining of some nausea this morning. Active Problems:   Acute respiratory failure with hypoxia   CHF (congestive  heart failure)    Plan: I have requested cardiology consultation for input as to whether we should change her outpatient medications or whether this is something that we simply need to anticipate happening periodically considering her very low cardiac ejection fraction. She may be able to be discharged later today    LOS: 1 day   Arne Schlender L 10/20/2014, 8:44 AM

## 2014-10-20 NOTE — Plan of Care (Signed)
Patient was placed on room air at 10:35.  She was ambulated approx. 11:30 am.  She walked approx. 250 feet on room air and her sats remained greater than 95%.  The patient tolerated the activity without difficulty.  She required only supervision and she had no complaints of dizziness, SOB, or discomfort.  I will notify Dr. Luan Pulling of her progress.

## 2014-10-20 NOTE — Care Management Note (Signed)
Case Management Note  Patient Details  Name: GITTEL MCCAMISH MRN: [id number] Date of Birth: 02/23/1941   Expected Discharge Date:                  Expected Discharge Plan:  Home/Self Care  In-House Referral:  NA  Discharge planning Services  CM Consult  Post Acute Care Choice:    Choice offered to:  NA  DME Arranged:    DME Agency:     HH Arranged:    [place] Agency:     Status of Service:  Completed, signed off  Medicare Important Message Given:  No Date Medicare IM Given:    Medicare IM give by:    Date Additional Medicare IM Given:    Additional Medicare Important Message give by:     If discussed at [place] of Stay Meetings, dates discussed:    Additional Comments: Pt is from home, lives with grandson and independent at baseline. Pt is active with [facility]. Pt has no HH services or DME's prior to admission. Pt to discharge home today with self care. [facility] made aware of DC. Pt does not meet requirements for home O2. No CM needs.   Sherald Barge, RN 10/20/2014, 12:20 PM

## 2014-10-20 NOTE — Consult Note (Addendum)
Primary cardiologist: Dr Carlyle Dolly MD Consulting cardiologist: Dr Carlyle Dolly MD  Clinical Summary Ms. Droege is a 74 y.o.female with a history of NICM/chronic systolif HF LVEF 20%, hx of VT on long term amidoarone (she has refused ICD), mild to moderate mitral regurgtiation, HL, admitted with SOB  10/2014 echo LVEF 20%, grade II diastolic dysfunction, moderate MR K 4.0, Cr 0.49, BUN 25, Hgb 12.6, Plt 164,  Mg 2.1, Trop 0.05, BNP 672, TSH 10.7 CXR pulm edema EKG SR, LBBB   Allergies  Allergen Reactions  . Codeine Other (See Comments)    Reaction:  Racing heart   . Decongestant [Pseudoephedrine Hcl Er] Other (See Comments)    Reaction:  Racing heart     Medications Scheduled Medications: . amiodarone  100 mg Oral Daily  . aspirin EC  81 mg Oral Daily  . atorvastatin  20 mg Oral q1800  . carvedilol  3.125 mg Oral BID  . furosemide  40 mg Intravenous Q12H  . heparin  5,000 Units Subcutaneous 3 times per day  . insulin aspart  0-5 Units Subcutaneous QHS  . insulin aspart  0-9 Units Subcutaneous TID WC  . levothyroxine  88 mcg Oral QAC breakfast  . losartan  100 mg Oral Daily  . pantoprazole  40 mg Oral Daily  . potassium chloride  40 mEq Oral BID  . sodium chloride  3 mL Intravenous Q12H  . sodium chloride  3 mL Intravenous Q12H  . spironolactone  25 mg Oral Daily     Infusions:     PRN Medications:  sodium chloride, acetaminophen, ondansetron, sodium chloride   Past Medical History  Diagnosis Date  . Cardiomyopathy, nonischemic     Nl cors in 1989 and 1998; CHF in 12/97; EF of 40% in 5/01 and 7/03, 25% in 9/04 and 4/08.  systolic murmur without valvular abnormalities by echo; refused automatic implantable cardiac defibrillator  . Left bundle Edman Lipsey block   . Hypertension   . Hyperlipidemia   . Diabetes mellitus, type II     No insulin  . Cerebrovascular disease     Duplex study in 12/2006 shows atherosclerosis without focal stenosis  . Atrial  flutter     Versus ventricular tachycardia; treated with amiodarone  . Gastroesophageal reflux disease   . Ventricular tachycardia     a. PMH it lists "atrial flutter versus ventricular tachycardia treated with amiodarone" - details of this are unclear. Notes from back to 2006 indicate she has been maintained on amiodarone but do not describe further indication. Patient's sister reports pt was told she had skipped beats that could cause her to drop dead - Dr. Lattie Haw put her on amiodarone and recommended a defibrillator.    Past Surgical History  Procedure Laterality Date  . Hernia repair      x2  . Abdominal hysterectomy  2006  . Colonoscopy  2008    Family History  Problem Relation Age of Onset  . Diabetes Mother   . Kidney disease Mother   . Diabetes Sister   . Heart attack Brother     Social History Ms. Zwack reports that she has never smoked. She has never used smokeless tobacco. Ms. Kiernan reports that she does not drink alcohol.  Review of Systems CONSTITUTIONAL: No weight loss, fever, chills, weakness or fatigue.  HEENT: Eyes: No visual loss, blurred vision, double vision or yellow sclerae. No hearing loss, sneezing, congestion, runny nose or sore throat.  SKIN: No rash or itching.  CARDIOVASCULAR: no chest pain RESPIRATORY: No cough or sputum.  GASTROINTESTINAL: No anorexia, nausea, vomiting or diarrhea. No abdominal pain or blood.  GENITOURINARY: no polyuria, no dysuria NEUROLOGICAL: No headache, dizziness, syncope, paralysis, ataxia, numbness or tingling in the extremities. No change in bowel or bladder control.  MUSCULOSKELETAL: No muscle, back pain, joint pain or stiffness.  HEMATOLOGIC: No anemia, bleeding or bruising.  LYMPHATICS: No enlarged nodes. No history of splenectomy.  PSYCHIATRIC: No history of depression or anxiety.      Physical Examination Blood pressure 125/70, pulse 72, temperature 97.4 F (36.3 C), temperature source Oral, resp. rate  20, height 4\' 11"  (1.499 m), weight 104 lb 12.8 oz (47.537 kg), SpO2 100 %.  Intake/Output Summary (Last 24 hours) at 10/20/14 0825 Last data filed at 10/20/14 0532  Gross per 24 hour  Intake    584 ml  Output   2100 ml  Net  -1516 ml    HEENT: sclera clear  Cardiovascular: RRR, 2/6 systolic murmur at apex, no JVD  Respiratory: CTAB  GI abdomen soft, NT, ND  MSK: no LE edema  Neuro: no focal deficits  Psych: appropriate affect   Lab Results  Basic Metabolic Panel:  Recent Labs Lab 10/18/14 0305 10/19/14 0607 10/20/14 0556  NA 140 142 140  K 3.9 3.5 4.0  CL 104 105 104  CO2 28 30 27   GLUCOSE 167* 141* 159*  BUN 23* 24* 25*  CREATININE 0.67 0.60 0.49  CALCIUM 9.3 9.0 9.3  MG  --   --  2.1    Liver Function Tests:  Recent Labs Lab 10/19/14 0607 10/20/14 0556  AST 22 19  ALT 20 20  ALKPHOS 50 48  BILITOT 0.6 0.9  PROT 7.0 7.0  ALBUMIN 4.2 4.2    CBC:  Recent Labs Lab 10/18/14 0305 10/19/14 0607 10/20/14 0556  WBC 10.0 6.8 5.6  NEUTROABS 5.4 4.4 3.3  HGB 13.8 12.0 12.6  HCT 42.5 37.8 39.0  MCV 98.8 98.7 98.5  PLT 186 161 164    Cardiac Enzymes:  Recent Labs Lab 10/18/14 0305 10/18/14 0659 10/18/14 1219 10/18/14 1815  TROPONINI 0.03 0.05* 0.06* 0.05*    BNP: Invalid input(s): POCBNP      Impression/Recommendations 1. Acute on chronic combined systolic/diastolic HF - negative 1.5 liters yesterday, negative 2.3 liters since admission. She is on lasix 40mg  IV bid, renal function remains stable. She appears euvolemic.  - other CHF meds include coreg, losartan, aldactone - will have patient ambulate with nursing staff today, if does well can consider discharge.  - if discharged would place on lasix 40mg  in AM and 20mg  in PM. Will need f/u with NP [place] in 1 week.  - she is also willing to consider an ICD at this time, for the last several years she had not been interested. With her LBBB she might be a good candidate for BiV  ICD. Will have her see Dr Lovena Le as outpatient. I have messaged our schedule in regards to both appointments.    2. Mild troponin elevation Troponin just above the level of detection at 0.05-0.06, non-specific finding in setting of CHF.  - no plans for ischemic testing at this time  3. Elevated TSH - f/u free T4 and T3, of note she is on amiodarone for history of VT   Carlyle Dolly, M.D.

## 2014-10-20 NOTE — Patient Outreach (Signed)
[facility]) Care Management  10/20/2014  CITLALLI WEIKEL Nov 09, 1940 [id number]   Mrs. Seyler was admitted to [facility] over the weekend presenting with shortness of breath. Working diagnoses are respiratory failure with acute/chronic CHF. She is, as of today, down 2.3L (volume overload).  I began seeing Mrs. Fletes after her last hospital admission. She was exceptionally interested in fine tuning her self health management and eagerly learned all that was offered to her with regard to CHF disease management. She has an empty 2L bottle in her kitchen with a sharpie line at 1.5L. She fills the bottle with water equaling consumed amounts of fluids each day so that she has a visual reminder. She is able to verbalize a good understanding of her prescribed low sodium, heart healthy diet and is making healthy choices. She is taking medications exactly as prescribed based on refill inquiries and is seeing providers as scheduled. As noted during this hospitalization, Mrs. Clodfelter has declined recommended ICD placement over the last few years. Dr. Luan Pulling and the cardiology team in [place] have discussed this with her on numerous occasions and I have discussed it with her several times at home. I am glad to see that she is considering ICD placement now.   I have notified hospital liaisons of her admission and also see that she may be discharged today. I will plan to call Mrs. Minium at home tomorrow for transition of care assessment and will see her face to face at home in the next 1-2 weeks. In addition, I have referred her to the [facility] telephonic program for installation of telemonitoring equipment and placed a follow up request this morning.     [place] Management  [phone]

## 2014-10-20 NOTE — Consult Note (Signed)
   [facility] Inpatient Consult   10/20/2014  XAN SPARKMAN 1940-08-04 [id number]   Made aware by [facility] of patient's admission. [facility] Care Management will continue to follow post hospital discharge. [facility] RNCM has indicated that she has been working on Estate agent through [employer]. Will make inpatient RNCM aware that patient is active with [place] Management services.   Marthenia Rolling, MSN-Ed, RN,BSN [facility] Liaison [phone]

## 2014-10-21 ENCOUNTER — Other Ambulatory Visit: Payer: Self-pay | Admitting: *Deleted

## 2014-10-21 LAB — T3: T3, Total: 63 ng/dL — ABNORMAL LOW (ref 71–180)

## 2014-10-21 NOTE — Discharge Summary (Signed)
Physician Discharge Summary  Patient ID: Donna [name] MRN: [id number] DOB/AGE: 1940-10-08 74 y.o. Primary Care Physician:Donna Belmares L, MD Admit date: 10/18/2014 Discharge date: 10/21/2014    Discharge Diagnoses:   Active Problems:   Hypothyroidism   Left bundle branch block   GASTROESOPHAGEAL REFLUX DISEASE   Cardiomyopathy, nonischemic   Diabetes mellitus, type II   CHF exacerbation   Acute respiratory failure with hypoxia   CHF (congestive heart failure)   Acute on chronic systolic heart failure     Medication List    STOP taking these medications        simvastatin 40 MG tablet  Commonly known as:  ZOCOR      TAKE these medications        amiodarone 200 MG tablet  Commonly known as:  PACERONE  Take 100 mg by mouth daily.     aspirin 81 MG tablet  Take 81 mg by mouth daily.     atorvastatin 20 MG tablet  Commonly known as:  LIPITOR  Take 1 tablet (20 mg total) by mouth daily at 6 PM.     calcium gluconate 500 MG tablet  Take 500 mg by mouth 2 (two) times daily.     carvedilol 3.125 MG tablet  Commonly known as:  COREG  Take 1 tablet (3.125 mg total) by mouth 2 (two) times daily.     fish oil-omega-3 fatty acids 1000 MG capsule  Take 1 capsule by mouth 3 (three) times daily.     furosemide 40 MG tablet  Commonly known as:  LASIX  Take 20-40 mg by mouth 2 (two) times daily. Pt takes 1 tablet in the morning and 0.5 tablet at supper.     levothyroxine 75 MCG tablet  Commonly known as:  SYNTHROID, LEVOTHROID  Take 75 mcg by mouth daily before breakfast.     losartan 100 MG tablet  Commonly known as:  COZAAR  Take 100 mg by mouth daily.     omeprazole 20 MG capsule  Commonly known as:  PRILOSEC  Take 20 mg by mouth 2 (two) times daily.     potassium chloride 20 MEQ packet  Commonly known as:  KLOR-CON  Take 20 mEq by mouth 2 (two) times daily.     spironolactone 25 MG tablet  Commonly known as:  ALDACTONE  Take 25 mg by mouth daily.         Discharged Condition: Improved    Consults: Cardiology  Significant Diagnostic Studies: Dg Chest Portable 1 View  10/18/2014   CLINICAL DATA:  History of CHF. Complains of congestion and chest pain. Shortness of breath.  EXAM: PORTABLE CHEST - 1 VIEW  COMPARISON:  07/22/2014  FINDINGS: Cardiac enlargement with pulmonary vascular congestion. Perihilar airspace and interstitial changes consistent with edema. Mild progression since previous study. No blunting of costophrenic angles. No pneumothorax. Calcification of the aorta.  IMPRESSION: Cardiac enlargement with pulmonary vascular congestion and edema. Progression since previous study.   Electronically Signed   By: Lucienne Capers M.D.   On: 10/18/2014 03:34    Lab Results: Basic Metabolic Panel:  Recent Labs  10/19/14 0607 10/20/14 0556  NA 142 140  K 3.5 4.0  CL 105 104  CO2 30 27  GLUCOSE 141* 159*  BUN 24* 25*  CREATININE 0.60 0.49  CALCIUM 9.0 9.3  MG  --  2.1   Liver Function Tests:  Recent Labs  10/19/14 0607 10/20/14 0556  AST 22 19  ALT 20 20  ALKPHOS  50 48  BILITOT 0.6 0.9  PROT 7.0 7.0  ALBUMIN 4.2 4.2     CBC:  Recent Labs  10/19/14 0607 10/20/14 0556  WBC 6.8 5.6  NEUTROABS 4.4 3.3  HGB 12.0 12.6  HCT 37.8 39.0  MCV 98.7 98.5  PLT 161 164    No results found for this or any previous visit (from the past 240 hour(s)).   Hospital Course: This is a 74 year old who has known severe congestive heart failure. She came to the emergency department with pulmonary edema and respiratory failure. She was treated with IV Lasix and improved. She had mildly elevated troponin which was felt to be due to demand ischemia. She has left bundle branch block on her EKG but did not have any other changes. She did much better with diuresis and had cardiology consultation and make sure we didn't need to change anything else. She did agree that she would be interested in a defibrillator device which in the past she  had not been wanting to do. She will have close follow-up in the cardiology office and in my office.  Discharge Exam: Blood pressure 124/67, pulse 67, temperature 98.3 F (36.8 C), temperature source Oral, resp. rate 20, height 4\' 11"  (1.499 m), weight 47.537 kg (104 lb 12.8 oz), SpO2 98 %. She is awake and alert. Her chest is clear. Her heart is regular.  Disposition: Home        Follow-up Information    Follow up with Donna Cavey L, MD In 1 week.   Specialty:  Pulmonary Disease   Why:  For Follow up appointment   Contact information:   [place]  [id number] [phone]       Signed: Jayquan Bradsher [name]   10/21/2014, 7:49 AM

## 2014-10-27 ENCOUNTER — Other Ambulatory Visit: Payer: Self-pay | Admitting: *Deleted

## 2014-10-27 NOTE — Patient Outreach (Signed)
[facility]) Care Management   10/27/2014  Donna [name] May 22, 1941 [id number]  Donna [name] is an 74 y.o. female  Subjective: Donna [name] was discharged from [facility] on 5/16 after an unexpected admission for CHF exacerbation. I began following Donna [name] for CHF disease management after referral from her primary care provider Dr. Sinda Du. Donna [name] was very open to new information about self health management and has been exceptionally adherent to the plan of care. Over the last 2 years, device placement has been recommended to Donna [name] for her low output heart failure and high risk for sudden death. She has declined until this recent admission and says she has a better understanding of benefits of device placement. She is to see [name] NP @ [facility] [place] tomorrow.  Goals    .         Marland Kitchen "I'm ready to get that pacemaker or defibrillator now."     Patient has declined recommendations for BiV/AICD since 2011 but states she wants to proceed at this time. States she spoke with Dr. Harl Bowie about this in the hospital and has also discussed it with Dr. Luan Pulling at length. I contacted [facility] today requesting call to patient to discuss appointment/follow up.     Marland Kitchen "The only thing I want to do is stay out of that hospital and stay on my feet"       Patient offered/advised AICD ~ 2 years ago and has declined thus far; however, after this last admit, she agreed that she would like to proceed with device placement; has appointment with cardiology tomorrow      Objective:     BP 120/60 mmHg  Pulse 59  Resp 18  Wt 101 lb (45.813 kg)  SpO2 98%   Review of Systems  Constitutional: Negative.   HENT: Negative.   Eyes: Negative.   Respiratory: Negative.   Gastrointestinal: Negative.   Genitourinary: Negative.   Musculoskeletal: Negative for falls.  Skin: Negative.   Neurological: Negative.     Psychiatric/Behavioral: Negative.     Physical Exam  Constitutional: She is oriented to person, place, and time. Vital signs are normal. She appears well-developed and well-nourished. She is active.  Cardiovascular: Normal rate and intact distal pulses.  An irregular rhythm present.  Murmur heard. Respiratory: Effort normal and breath sounds normal. No respiratory distress.  GI: Soft. Normal appearance and bowel sounds are normal.  Neurological: She is alert and oriented to person, place, and time.  Skin: Skin is warm and dry.     Psychiatric: She has a normal mood and affect. Her speech is normal and behavior is normal. Judgment and thought content normal. Cognition and memory are normal.    Current Medications:   Current Outpatient Prescriptions  Medication Sig Dispense Refill  . amiodarone (PACERONE) 200 MG tablet Take 100 mg by mouth daily.    Marland Kitchen aspirin 81 MG tablet Take 81 mg by mouth daily.    Marland Kitchen atorvastatin (LIPITOR) 20 MG tablet Take 1 tablet (20 mg total) by mouth daily at 6 PM. 30 tablet 12  . calcium gluconate 500 MG tablet Take 500 mg by mouth 2 (two) times daily.      . carvedilol (COREG) 3.125 MG tablet Take 1 tablet (3.125 mg total) by mouth 2 (two) times daily. 60 tablet 6  . fish oil-omega-3 fatty acids 1000 MG capsule Take 1 capsule by mouth 3 (three) times daily.      Marland Kitchen  furosemide (LASIX) 40 MG tablet Take 20-40 mg by mouth 2 (two) times daily. Pt takes 1 tablet in the morning and 0.5 tablet at supper.    . insulin detemir (LEVEMIR) 100 UNIT/ML injection Inject 12 Units into the skin at bedtime.    Marland Kitchen levothyroxine (SYNTHROID, LEVOTHROID) 75 MCG tablet Take 75 mcg by mouth daily before breakfast.    . linagliptin (TRADJENTA) 5 MG TABS tablet Take 5 mg by mouth daily.    Marland Kitchen losartan (COZAAR) 100 MG tablet Take 100 mg by mouth daily.    Marland Kitchen omeprazole (PRILOSEC) 20 MG capsule Take 20 mg by mouth 2 (two) times daily.      . potassium chloride (KLOR-CON) 20 MEQ packet Take  20 mEq by mouth 2 (two) times daily.      Marland Kitchen spironolactone (ALDACTONE) 25 MG tablet Take 25 mg by mouth daily.       No current facility-administered medications for this visit.   Assessment:    Acute/Chronic Health Condition (CHF) - recent hospitalization for CHF exacerbation; home x 1 week; cardiology visit tomorrow - will discuss device placement; taking medications as prescribed, weighing daily, adhering to provider appointment schedule, adhering to fluid restriction, adhering to prescribed diet. Can verbalize s/s CHF exacerbation and when to call for help.   Plan:  [place] Problem One        Patient Outreach from 10/27/2014 in [place] Problem One  Hospitalization for CHF   Care Plan for Problem One  Active   [facility] Long Term Goal (31-90 days)  patient will not be hospitalized for CHF exacerbation in the next 31 days   [facility] Long Term Goal Start Date  10/21/14   Interventions for Problem One Long Term Goal  utilizing teachback method, reviewed signs and symptoms of CHF exacerbation/volume overload,  utilizing teachback method, reviewed CHF evaluation tool in blue [facility] care management notebook/calendar   [facility] CM Short Term Goal #1 (0-30 days)  patient will verbalize understanding of signs and symptoms of worsening heart failure and when to call the doctor in the next 30 days as evidenced by patient report   [facility] CM Short Term Goal #1 Start Date  10/21/14   Interventions for Short Term Goal #1  utilizing teachback method, reviewed signs and symptoms of CHF exacerbation/volume overload,  utilizing teachback methodl, reviewed CHF evaluation tool in blue [facility] care management notebook/calendar   [facility] CM Short Term Goal #2 (0-30 days)  patient will weigh daily and record, calling doctor for weight gain of 3# overnight or 5# in a week as evidenced by patient report/documentation   [facility] CM Short Term Goal #2 Start Date  10/21/14   Interventions for Short Term Goal #2   utilizing teachback method, reviewed with patient rationale and importance of daily weights,  helped patient determine best place for scales and monitoring document for daily weights,  reviewed with patient when to call for weights outside established parameters   [facility] CM Short Term Goal #3 (0-30 days)  patient will take medications as prescribed 100% of the time over the next 30 days as evidenced by pill count   [facility] CM Short Term Goal #3 Start Date  10/21/14   Interventions for Short Tern Goal #3  reviewed all medications with patient    [facility] CM Care Plan Problem Two        Patient Outreach from 10/27/2014 in [place] Problem Two  Knowledge Deficit related to device placement plans   Care Plan for Problem Two  Active   Interventions for Problem Two Long Term Goal   utilizing teachback method, discussed rationale for pacemaker/ICD consideration and need for cardiology post hospital follow up appointment   [facility] Long Term Goal (31-90) days  patient will verbalize understanding of plan of care for device placement in the next 31 days   [facility] Long Term Goal Start Date  10/21/14   [facility] CM Short Term Goal #1 (0-30 days)  patient will verbalize understanding of medical condition warranting consideration of device placement over the next 30 days   [facility] CM Short Term Goal #1 Start Date  10/21/14   Interventions for Short Term Goal #2   utilizing teachback method, discussed again with patient options for device placement based on particular patient condition, usual/anticipated length of stay for procedure, usual follow up, generally expected outcomes including sudden death risk reduction and improved general quality of life   [facility] CM Short Term Goal #2 (0-30 days)  patient will attend cardiology follow up appointment in the next 30 days   [facility] CM Short Term Goal #2 Start Date  10/21/14   Interventions for Short Term Goal #2  contacted [facility] [place] to request information re:  cardiology and follow up for consideration of device placement       [facility] [place] Care Management  [phone]

## 2014-10-28 ENCOUNTER — Ambulatory Visit (INDEPENDENT_AMBULATORY_CARE_PROVIDER_SITE_OTHER): Payer: Medicare Other | Admitting: Adult Health

## 2014-10-28 ENCOUNTER — Encounter: Payer: Self-pay | Admitting: Adult Health

## 2014-10-28 VITALS — BP 112/56 | HR 55 | Ht 61.0 in | Wt 108.8 lb

## 2014-10-28 DIAGNOSIS — I5022 Chronic systolic (congestive) heart failure: Secondary | ICD-10-CM | POA: Diagnosis not present

## 2014-10-28 DIAGNOSIS — I420 Dilated cardiomyopathy: Secondary | ICD-10-CM

## 2014-10-28 DIAGNOSIS — I10 Essential (primary) hypertension: Secondary | ICD-10-CM

## 2014-10-28 NOTE — Progress Notes (Deleted)
Name: Donna [name]    DOB: 1941/01/17  Age: 74 y.o.  MR#: [id number]       PCP:  Alonza Bogus, MD      Insurance: Payor: [employer] MEDICARE / Plan: [facility] MEDICARE / Product Type: *No Product type* /   CC:    Chief Complaint  Patient presents with  . Cardiomyopathy    NICM  . Hypertension    VS Filed Vitals:   10/28/14 1304  BP: 112/56  Pulse: 55  Height: 5\' 1"  (1.549 m)  Weight: 108 lb 12.8 oz (49.351 kg)  SpO2: 98%    Weights Current Weight  10/28/14 108 lb 12.8 oz (49.351 kg)  10/27/14 101 lb (45.813 kg)  10/21/14 101 lb (45.813 kg)    Blood Pressure  BP Readings from Last 3 Encounters:  10/28/14 112/56  10/27/14 120/60  10/20/14 124/67     Admit date:  (Not on file) Last encounter with RMR:  08/29/2014   Allergy Codeine and Decongestant  Current Outpatient Prescriptions  Medication Sig Dispense Refill  . amiodarone (PACERONE) 200 MG tablet Take 100 mg by mouth daily.    Marland Kitchen aspirin 81 MG tablet Take 81 mg by mouth daily.    Marland Kitchen atorvastatin (LIPITOR) 20 MG tablet Take 1 tablet (20 mg total) by mouth daily at 6 PM. 30 tablet 12  . calcium gluconate 500 MG tablet Take 500 mg by mouth 2 (two) times daily.      . carvedilol (COREG) 3.125 MG tablet Take 1 tablet (3.125 mg total) by mouth 2 (two) times daily. 60 tablet 6  . fish oil-omega-3 fatty acids 1000 MG capsule Take 1 capsule by mouth 3 (three) times daily.      . furosemide (LASIX) 40 MG tablet Take 60 mg by mouth daily. Pt takes 1 tablet in the morning and 0.5 tablet at supper.    . insulin detemir (LEVEMIR) 100 UNIT/ML injection Inject 12 Units into the skin at bedtime.    Marland Kitchen levothyroxine (SYNTHROID, LEVOTHROID) 75 MCG tablet Take 75 mcg by mouth daily before breakfast.    . linagliptin (TRADJENTA) 5 MG TABS tablet Take 5 mg by mouth daily.    Marland Kitchen losartan (COZAAR) 100 MG tablet Take 100 mg by mouth daily.    Marland Kitchen omeprazole (PRILOSEC) 20 MG capsule Take 20 mg by mouth 2 (two) times daily.      .  potassium chloride (KLOR-CON) 20 MEQ packet Take 20 mEq by mouth 2 (two) times daily.      Marland Kitchen spironolactone (ALDACTONE) 25 MG tablet Take 25 mg by mouth daily.       No current facility-administered medications for this visit.    Discontinued Meds:   There are no discontinued medications.  Patient Active Problem List   Diagnosis Date Noted  . Acute on chronic systolic heart failure 40/98/1191  . CHF (congestive heart failure) 10/19/2014  . Acute respiratory failure with hypoxia 10/18/2014  . CHF exacerbation 07/22/2014  . Acute exacerbation of CHF (congestive heart failure) 07/22/2014  . Abnormality of gait 09/26/2013  . Weakness of left hip 09/26/2013  . Diabetes mellitus, type II 06/15/2011  . Cardiomyopathy, nonischemic   . Hypertension   . Hypothyroidism 05/28/2009  . Left bundle branch block 05/28/2009  . GASTROESOPHAGEAL REFLUX DISEASE 05/28/2009  . Hyperlipidemia 05/14/2008  . VENTRICULAR TACHYCARDIA 05/14/2008  . CEREBROVASCULAR DISEASE 05/14/2008    LABS    Component Value Date/Time   NA 140 10/20/2014 0556   NA 142 10/19/2014  0607   NA 140 10/18/2014 0305   K 4.0 10/20/2014 0556   K 3.5 10/19/2014 0607   K 3.9 10/18/2014 0305   CL 104 10/20/2014 0556   CL 105 10/19/2014 0607   CL 104 10/18/2014 0305   CO2 27 10/20/2014 0556   CO2 30 10/19/2014 0607   CO2 28 10/18/2014 0305   GLUCOSE 159* 10/20/2014 0556   GLUCOSE 141* 10/19/2014 0607   GLUCOSE 167* 10/18/2014 0305   BUN 25* 10/20/2014 0556   BUN 24* 10/19/2014 0607   BUN 23* 10/18/2014 0305   CREATININE 0.49 10/20/2014 0556   CREATININE 0.60 10/19/2014 0607   CREATININE 0.67 10/18/2014 0305   CREATININE 0.51 08/21/2014 1012   CREATININE 0.57 12/06/2010 0816   CALCIUM 9.3 10/20/2014 0556   CALCIUM 9.0 10/19/2014 0607   CALCIUM 9.3 10/18/2014 0305   GFRNONAA >60 10/20/2014 0556   GFRNONAA >60 10/19/2014 0607   GFRNONAA >60 10/18/2014 0305   GFRAA >60 10/20/2014 0556   GFRAA >60 10/19/2014 0607    GFRAA >60 10/18/2014 0305   CMP     Component Value Date/Time   NA 140 10/20/2014 0556   K 4.0 10/20/2014 0556   CL 104 10/20/2014 0556   CO2 27 10/20/2014 0556   GLUCOSE 159* 10/20/2014 0556   BUN 25* 10/20/2014 0556   CREATININE 0.49 10/20/2014 0556   CREATININE 0.51 08/21/2014 1012   CALCIUM 9.3 10/20/2014 0556   PROT 7.0 10/20/2014 0556   ALBUMIN 4.2 10/20/2014 0556   AST 19 10/20/2014 0556   ALT 20 10/20/2014 0556   ALKPHOS 48 10/20/2014 0556   BILITOT 0.9 10/20/2014 0556   GFRNONAA >60 10/20/2014 0556   GFRAA >60 10/20/2014 0556       Component Value Date/Time   WBC 5.6 10/20/2014 0556   WBC 6.8 10/19/2014 0607   WBC 10.0 10/18/2014 0305   HGB 12.6 10/20/2014 0556   HGB 12.0 10/19/2014 0607   HGB 13.8 10/18/2014 0305   HCT 39.0 10/20/2014 0556   HCT 37.8 10/19/2014 0607   HCT 42.5 10/18/2014 0305   MCV 98.5 10/20/2014 0556   MCV 98.7 10/19/2014 0607   MCV 98.8 10/18/2014 0305    Lipid Panel     Component Value Date/Time   CHOL 119 04/02/2009 0950   TRIG 172 04/02/2009 0950   HDL 34 04/02/2009 0950   CHOLHDL 5.1 05/09/2008 0332   VLDL 42* 05/09/2008 0332   LDLCALC 51 04/02/2009 0950    ABG    Component Value Date/Time   TCO2 30 05/08/2008 0742     Lab Results  Component Value Date   TSH 10.726* 10/18/2014   BNP (last 3 results)  Recent Labs  07/22/14 0230 10/18/14 0305  BNP 626.0* 672.0*    ProBNP (last 3 results)  Recent Labs  05/19/14 0304  PROBNP 1012.0*    Cardiac Panel (last 3 results) No results for input(s): CKTOTAL, CKMB, TROPONINI, RELINDX in the last 72 hours.  Iron/TIBC/Ferritin/ %Sat No results found for: IRON, TIBC, FERRITIN, IRONPCTSAT   EKG Orders placed or performed during the hospital encounter of 10/18/14  . EKG 12-Lead  . EKG 12-Lead  . EKG     Prior Assessment and Plan Problem List as of 10/28/2014      Cardiovascular and Mediastinum   Left bundle branch block   VENTRICULAR TACHYCARDIA   Last  Assessment & Plan 06/14/2012 Office Visit Written 06/15/2012 10:54 AM by Yehuda Savannah, MD    Recent laboratory studies  from patient's multiple physicians will be sought.  If any necessary testing for monitoring of amiodarone therapy has not been performed, we will arrange for it to be completed.  Chest x-ray has been requested.      CEREBROVASCULAR DISEASE   Last Assessment & Plan 06/14/2012 Office Visit Written 06/15/2012 10:50 AM by Yehuda Savannah, MD    Asymptomatic carotid bruit with atherosclerosis but no focal stenosis on ultrasound study in 12/2006 No bruit heard in 2013-does not appear to be an active problem.        Cardiomyopathy, nonischemic   Last Assessment & Plan 06/14/2012 Office Visit Written 06/15/2012 10:49 AM by Yehuda Savannah, MD    The patient remains well compensated with respect to nonischemic cardiomyopathy.  EF has been somewhat variable in the past, but has been consistently below 30% for at least the past decade.  Despite this, Ms. Wimmer's functional status is excellent.  Current therapy will be maintained.  Electrolytes and renal function will be monitored.      Hypertension   Last Assessment & Plan 06/14/2012 Office Visit Written 06/15/2012 10:53 AM by Yehuda Savannah, MD    Cardiac medications utilized to treat cardiomyopathy have provided excellent control of hypertension for many years.      CHF exacerbation   Acute exacerbation of CHF (congestive heart failure)   CHF (congestive heart failure)   Acute on chronic systolic heart failure     Respiratory   Acute respiratory failure with hypoxia     Digestive   GASTROESOPHAGEAL REFLUX DISEASE     Endocrine   Hypothyroidism   Last Assessment & Plan 06/15/2011 Office Visit Edited 06/22/2011 12:12 PM by Yehuda Savannah, MD    Euthyroid clinically and chemically as assessed by a normal TSH 7 months ago, on 0.075 mg of levothyroxine per day      Diabetes mellitus, type II   Last Assessment & Plan  06/14/2012 Office Visit Edited 06/17/2012 12:45 PM by Yehuda Savannah, MD    Control of diabetes has not been a major problem.  Recent CBGs of around 160 as reported by the patient suggests that there is still some room for improvement.        Other   Hyperlipidemia   Last Assessment & Plan 06/14/2012 Office Visit Written 06/15/2012 10:52 AM by Yehuda Savannah, MD    Lipid profile has been excellent with current therapy, most recently in 03/2011.  Dr. Luan Pulling is managing and monitoring this issue.      Abnormality of gait   Weakness of left hip       Imaging: Dg Chest Portable 1 View  10/18/2014   CLINICAL DATA:  History of CHF. Complains of congestion and chest pain. Shortness of breath.  EXAM: PORTABLE CHEST - 1 VIEW  COMPARISON:  07/22/2014  FINDINGS: Cardiac enlargement with pulmonary vascular congestion. Perihilar airspace and interstitial changes consistent with edema. Mild progression since previous study. No blunting of costophrenic angles. No pneumothorax. Calcification of the aorta.  IMPRESSION: Cardiac enlargement with pulmonary vascular congestion and edema. Progression since previous study.   Electronically Signed   By: Lucienne Capers M.D.   On: 10/18/2014 03:34

## 2014-10-28 NOTE — Patient Instructions (Addendum)
Your physician recommends that you schedule a follow-up appointment with Dr. Lovena Le ICD/ Pacemaker placement  Your physician recommends that you schedule a follow-up appointment in [place] office after visit with Dr. Lovena Le  Your physician recommends that you continue on your current medications as directed. Please refer to the Current Medication list given to you today.  Thank you for choosing [facility]!

## 2014-10-28 NOTE — Progress Notes (Signed)
Cardiology Office Note   Date:  10/28/2014   ID:  Donna, [name] March 31, 1941, MRN [id number]  PCP:  Alonza Bogus, MD  Cardiologist:  Cloria Spring, NP   Chief Complaint  Patient presents with  . Cardiomyopathy    NICM  . Hypertension      History of Present Illness: Donna [name] is a 74 y.o. female who presents for ongoing assessment and management of NICM, LVEF of 20% by echo, Hx of VT on amiodarone, and hyperlipidemia.  She was last seen on 08/29/2014. Unfortunately, she was admitted for decompensated CHF in May of 2016.She was treated with IV diuretics.now interested in ICD pacemaker.   Echo in May of 2016 Left ventricle: There is global hypokinesis with akinesis of the anterior, inferior walls and paradoxical septal motion. Overall LVEF is severely refused estimated at 15-20%. The cavity size was mildly dilated. There was moderate concentric hypertrophy. Systolic function was severely reduced. The estimated ejection fraction was in the range of 15-20%. Features are consistent with a pseudonormal left ventricular filling pattern, with concomitant abnormal relaxation and increased filling pressure (grade 2 diastolic dysfunction). Doppler parameters are consistent with elevated ventricular end-diastolic filling pressure. - Ventricular septum: Septal motion showed paradox. - Aortic valve: Trileaflet; normal thickness leaflets. There was no regurgitation. - Aortic root: The aortic root was normal in size. - Mitral valve: Structurally normal valve. There was moderate regurgitation. - Left atrium: The atrium was normal in size. - Right ventricle: Systolic function was mildly reduced. - Right atrium: The atrium was normal in size. - Tricuspid valve: There was mild regurgitation. - Pulmonary arteries: Systolic pressure was within the normal range. PA peak pressure: 34 mm Hg (S). - Inferior vena cava: The vessel was normal in size. -  Pericardium, extracardiac: A trivial pericardial effusion was identified.  Her only complaint today is that of occasional dizziness when she gets up and walks. She states she has "good days and bad days" concerning her energy level.  Past Medical History  Diagnosis Date  . Cardiomyopathy, nonischemic     Nl cors in 1989 and 1998; CHF in 12/97; EF of 40% in 5/01 and 7/03, 25% in 9/04 and 4/08.  systolic murmur without valvular abnormalities by echo; refused automatic implantable cardiac defibrillator  . Left bundle branch block   . Hypertension   . Hyperlipidemia   . Diabetes mellitus, type II     No insulin  . Cerebrovascular disease     Duplex study in 12/2006 shows atherosclerosis without focal stenosis  . Atrial flutter     Versus ventricular tachycardia; treated with amiodarone  . Gastroesophageal reflux disease   . Ventricular tachycardia     a. PMH it lists "atrial flutter versus ventricular tachycardia treated with amiodarone" - details of this are unclear. Notes from back to 2006 indicate she has been maintained on amiodarone but do not describe further indication. Patient's sister reports pt was told she had skipped beats that could cause her to drop dead - Dr. Lattie Haw put her on amiodarone and recommended a defibrillator.    Past Surgical History  Procedure Laterality Date  . Hernia repair      x2  . Abdominal hysterectomy  2006  . Colonoscopy  2008     Current Outpatient Prescriptions  Medication Sig Dispense Refill  . amiodarone (PACERONE) 200 MG tablet Take 100 mg by mouth daily.    Marland Kitchen aspirin 81 MG tablet Take 81 mg by mouth daily.    Marland Kitchen  atorvastatin (LIPITOR) 20 MG tablet Take 1 tablet (20 mg total) by mouth daily at 6 PM. 30 tablet 12  . calcium gluconate 500 MG tablet Take 500 mg by mouth 2 (two) times daily.      . carvedilol (COREG) 3.125 MG tablet Take 1 tablet (3.125 mg total) by mouth 2 (two) times daily. 60 tablet 6  . fish oil-omega-3 fatty acids 1000 MG  capsule Take 1 capsule by mouth 3 (three) times daily.      . furosemide (LASIX) 40 MG tablet Take 60 mg by mouth daily. Pt takes 1 tablet in the morning and 0.5 tablet at supper.    . insulin detemir (LEVEMIR) 100 UNIT/ML injection Inject 12 Units into the skin at bedtime.    Marland Kitchen levothyroxine (SYNTHROID, LEVOTHROID) 75 MCG tablet Take 75 mcg by mouth daily before breakfast.    . linagliptin (TRADJENTA) 5 MG TABS tablet Take 5 mg by mouth daily.    Marland Kitchen losartan (COZAAR) 100 MG tablet Take 100 mg by mouth daily.    Marland Kitchen omeprazole (PRILOSEC) 20 MG capsule Take 20 mg by mouth 2 (two) times daily.      . potassium chloride (KLOR-CON) 20 MEQ packet Take 20 mEq by mouth 2 (two) times daily.      Marland Kitchen spironolactone (ALDACTONE) 25 MG tablet Take 25 mg by mouth daily.       No current facility-administered medications for this visit.    Allergies:   Codeine and Decongestant    Social History:  The patient  reports that she has never smoked. She has never used smokeless tobacco. She reports that she does not drink alcohol or use illicit drugs.   Family History:  The patient's family history includes Diabetes in her mother and sister; Heart attack in her brother; Kidney disease in her mother.    ROS: .   All other systems are reviewed and negative.Unless otherwise mentioned in H&P above.   PHYSICAL EXAM: VS:  BP 112/56 mmHg  Pulse 55  Ht 5\' 1"  (1.549 m)  Wt 108 lb 12.8 oz (49.351 kg)  BMI 20.57 kg/m2  SpO2 98% , BMI Body mass index is 20.57 kg/(m^2). GEN: Well nourished, well developed, in no acute distress HEENT: normal Neck: no JVD, carotid bruits, or masses Cardiac: RRR; 1/6 systolic murmur  rubs, or gallops,no edema  Respiratory:  Clear to auscultation bilaterally, normal work of breathing GI: soft, nontender, nondistended, + BS MS: no deformity or atrophy Skin: warm and dry, no rash Neuro:  Strength and sensation are intact Psych: euthymic mood, full affect  Recent Labs: 05/19/2014: Pro  B Natriuretic peptide (BNP) 1012.0* 10/18/2014: B Natriuretic Peptide 672.0*; TSH 10.726* 10/20/2014: ALT 20; BUN 25*; Creatinine 0.49; Hemoglobin 12.6; Magnesium 2.1; Platelets 164; Potassium 4.0; Sodium 140    Lipid Panel    Component Value Date/Time   CHOL 119 04/02/2009 0950   TRIG 172 04/02/2009 0950   HDL 34 04/02/2009 0950   CHOLHDL 5.1 05/09/2008 0332   VLDL 42* 05/09/2008 0332   LDLCALC 51 04/02/2009 0950      Wt Readings from Last 3 Encounters:  10/28/14 108 lb 12.8 oz (49.351 kg)  10/27/14 101 lb (45.813 kg)  10/21/14 101 lb (45.813 kg)      Other studies Reviewed: Additional studies/ records that were reviewed today include: Echocardiogram report.  Review of the above records demonstrates: Reduced EF of 15%-20%.    ASSESSMENT AND PLAN:  1. NICM:  She is at baseline. She denies  palpitations, but has some dizziness. I will not make any changes in her medication regimen at this time. She is now ready to be referred for ICD placement. I have spoken to Dr.Branch about this as he discussed this in his last note while seeing in the hospital. He is in agreement that she would benefit from referral. She is anxious to proceed with implant. Will have her scheduled with Dr. Lovena Le in the next available appt, either in [place] or [place], whichever comes first. She may be a candidate for Entresto once her pacemaker is placed.   2. Hypertension: BP is controlled currently. She may need titration up on carvedilol once pacemaker is in place to maximize medication regimen. Will wait until after Dr. Lovena Le sees her and plans implant. Continue ARB and Spironolactone.     Current medicines are reviewed at length with the patient today.    Labs/ tests ordered today include: None No orders of the defined types were placed in this encounter.     Disposition:   FU with Dr. Lovena Le   Signed, [name], NP  10/28/2014 1:31 PM    [place]. [address] [place], [place] [address] Phone: [phone]; Fax: [phone]

## 2014-11-04 ENCOUNTER — Encounter: Payer: Self-pay | Admitting: *Deleted

## 2014-11-04 NOTE — Patient Outreach (Signed)
[facility]) Care Management  11/04/2014  DEIDREA GAETZ [birth year] [id number]   Mrs. Shibuya is enrolled in [facility] telemonitoring program. Her equipment (wireless tablet, scale) were delivered then installed by her grandson. Her [employer] telemonitoring contact is Havery Moros RN [phone].   I spoke with Ms. Farrel Conners today to discuss Mrs. Lewter's current status and plans to be worked up for [employer].   I will continue to follow Mrs. Weisheit closely by phone and in person.   Janalyn Shy MHA,BSN,RN,CCM [facility] Care Management  [phone]

## 2014-11-11 ENCOUNTER — Encounter: Payer: Self-pay | Admitting: Internal Medicine

## 2014-11-11 ENCOUNTER — Ambulatory Visit (INDEPENDENT_AMBULATORY_CARE_PROVIDER_SITE_OTHER): Payer: Medicare Other | Admitting: Internal Medicine

## 2014-11-11 VITALS — BP 120/68 | HR 63 | Ht 61.0 in | Wt 111.2 lb

## 2014-11-11 DIAGNOSIS — I4729 Other ventricular tachycardia: Secondary | ICD-10-CM

## 2014-11-11 DIAGNOSIS — I447 Left bundle-branch block, unspecified: Secondary | ICD-10-CM

## 2014-11-11 DIAGNOSIS — I429 Cardiomyopathy, unspecified: Secondary | ICD-10-CM | POA: Diagnosis not present

## 2014-11-11 DIAGNOSIS — I5023 Acute on chronic systolic (congestive) heart failure: Secondary | ICD-10-CM | POA: Diagnosis not present

## 2014-11-11 DIAGNOSIS — Z01812 Encounter for preprocedural laboratory examination: Secondary | ICD-10-CM | POA: Diagnosis not present

## 2014-11-11 DIAGNOSIS — I472 Ventricular tachycardia: Secondary | ICD-10-CM

## 2014-11-11 DIAGNOSIS — I10 Essential (primary) hypertension: Secondary | ICD-10-CM

## 2014-11-11 LAB — BASIC METABOLIC PANEL
BUN: 19 mg/dL (ref 6–23)
CALCIUM: 9.4 mg/dL (ref 8.4–10.5)
CHLORIDE: 100 meq/L (ref 96–112)
CO2: 31 meq/L (ref 19–32)
Creatinine, Ser: 0.6 mg/dL (ref 0.40–1.20)
GFR: 103.54 mL/min (ref >60.00)
Glucose, Bld: 98 mg/dL (ref 70–99)
Potassium: 4.4 mEq/L (ref 3.5–5.1)
Sodium: 137 mEq/L (ref 135–145)

## 2014-11-11 LAB — CBC WITH DIFFERENTIAL/PLATELET
BASOS ABS: 0 10*3/uL (ref 0.0–0.1)
BASOS PCT: 0.7 % (ref 0.0–3.0)
Eosinophils Absolute: 0.1 10*3/uL (ref 0.0–0.7)
Eosinophils Relative: 1.7 % (ref 0.0–5.0)
HEMATOCRIT: 37.1 % (ref 36.0–46.0)
HEMOGLOBIN: 12.3 g/dL (ref 12.0–15.0)
Lymphocytes Relative: 33.8 % (ref 12.0–46.0)
Lymphs Abs: 2 10*3/uL (ref 0.7–4.0)
MCHC: 33.1 g/dL (ref 30.0–36.0)
MCV: 96.3 fl (ref 78.0–100.0)
MONO ABS: 0.5 10*3/uL (ref 0.1–1.0)
Monocytes Relative: 7.7 % (ref 3.0–12.0)
NEUTROS PCT: 56.1 % (ref 43.0–77.0)
Neutro Abs: 3.3 10*3/uL (ref 1.4–7.7)
PLATELETS: 150 10*3/uL (ref 150.0–400.0)
RBC: 3.85 Mil/uL — ABNORMAL LOW (ref 3.87–5.11)
RDW: 14.3 % (ref 11.5–15.5)
WBC: 5.9 10*3/uL (ref 4.0–10.5)

## 2014-11-11 NOTE — Assessment & Plan Note (Signed)
It is unclear whether she has VT or atrial flutter or some other arrhythmia. I would anticipate stopping her amiodarone after her ICD is placed.

## 2014-11-11 NOTE — Patient Instructions (Addendum)
Medication Instructions:  Your physician recommends that you continue on your current medications as directed. Please refer to the Current Medication list given to you today.   Labwork: Your physician recommends that you return for lab work: TODAY (BMET, CBC)   Testing/Procedures: NONE  Follow-Up: Our office will be in contact with you before the end of the week to discuss the details of your procedure (Bi-V ICD).   Any Other Special Instructions Will Be Listed Below (If Applicable).  Pt aware to HOLD 12 units LEVEMIR night before procedure and morning blood sugar tends to run low normally.  Hand written on procedure letter.

## 2014-11-11 NOTE — Progress Notes (Signed)
HPI Donna [name] is referred today for consideration for BiV ICD implant. She is a pleasant 74 yo woman with chronic systolic heart, LBBB, HTN, and a wide QRS tachycardia for which she chronically been on amiodarone in low dose.  Allergies  Allergen Reactions  . Codeine Other (See Comments)    Reaction:  Racing heart   . Decongestant [Pseudoephedrine Hcl Er] Other (See Comments)    Reaction:  Racing heart      Current Outpatient Prescriptions  Medication Sig Dispense Refill  . amiodarone (PACERONE) 200 MG tablet Take 100 mg by mouth daily.    Marland Kitchen aspirin 81 MG tablet Take 81 mg by mouth daily.    Marland Kitchen atorvastatin (LIPITOR) 20 MG tablet Take 1 tablet (20 mg total) by mouth daily at 6 PM. 30 tablet 12  . calcium gluconate 500 MG tablet Take 500 mg by mouth 2 (two) times daily.      . carvedilol (COREG) 3.125 MG tablet Take 1 tablet (3.125 mg total) by mouth 2 (two) times daily. 60 tablet 6  . fish oil-omega-3 fatty acids 1000 MG capsule Take 1 capsule by mouth 3 (three) times daily.      . furosemide (LASIX) 40 MG tablet Take 60 mg by mouth daily. Pt takes 1 tablet in the morning and 0.5 tablet at supper.    . insulin detemir (LEVEMIR) 100 UNIT/ML injection Inject 12 Units into the skin at bedtime.    Marland Kitchen levothyroxine (SYNTHROID, LEVOTHROID) 75 MCG tablet Take 75 mcg by mouth daily before breakfast.    . linagliptin (TRADJENTA) 5 MG TABS tablet Take 5 mg by mouth daily.    Marland Kitchen losartan (COZAAR) 100 MG tablet Take 100 mg by mouth daily.    Marland Kitchen omeprazole (PRILOSEC) 20 MG capsule Take 20 mg by mouth 2 (two) times daily.      . potassium chloride (KLOR-CON) 20 MEQ packet Take 20 mEq by mouth 2 (two) times daily.      Marland Kitchen spironolactone (ALDACTONE) 25 MG tablet Take 25 mg by mouth daily.       No current facility-administered medications for this visit.     Past Medical History  Diagnosis Date  . Cardiomyopathy, nonischemic     Nl cors in 1989 and 1998; CHF in 12/97; EF of 40% in 5/01  and 7/03, 25% in 9/04 and 4/08.  systolic murmur without valvular abnormalities by echo; refused automatic implantable cardiac defibrillator  . Left bundle branch block   . Hypertension   . Hyperlipidemia   . Diabetes mellitus, type II     No insulin  . Cerebrovascular disease     Duplex study in 12/2006 shows atherosclerosis without focal stenosis  . Atrial flutter     Versus ventricular tachycardia; treated with amiodarone  . Gastroesophageal reflux disease   . Ventricular tachycardia     a. PMH it lists "atrial flutter versus ventricular tachycardia treated with amiodarone" - details of this are unclear. Notes from back to 2006 indicate she has been maintained on amiodarone but do not describe further indication. Patient's sister reports pt was told she had skipped beats that could cause her to drop dead - Dr. Lattie Haw put her on amiodarone and recommended a defibrillator.    ROS:   All systems reviewed and negative except as noted in the HPI.   Past Surgical History  Procedure Laterality Date  . Hernia repair      x2  . Abdominal hysterectomy  2006  .  Colonoscopy  2008     Family History  Problem Relation Age of Onset  . Diabetes Mother   . Kidney disease Mother   . Diabetes Sister   . Heart attack Brother      History   Social History  . Marital Status: Widowed    Spouse Name: N/A  . Number of Children: N/A  . Years of Education: N/A   Occupational History  . Retired    Social History Main Topics  . Smoking status: Never Smoker   . Smokeless tobacco: Never Used  . Alcohol Use: No  . Drug Use: No  . Sexual Activity: No   Other Topics Concern  . Not on file   Social History Narrative   Lives in [place] with son and grandson   Physically active     BP 120/68 mmHg  Pulse 63  Ht 5\' 1"  (1.549 m)  Wt 111 lb 3.2 oz (50.44 kg)  BMI 21.02 kg/m2  Physical Exam:  Well appearing 74 yo woman, NAD HEENT: Unremarkable Neck:  No JVD, no  thyromegally Lymphatics:  No adenopathy Back:  No CVA tenderness Lungs:  Clear with no wheezes HEART:  Regular rate rhythm, no murmurs, no rubs, no clicks, split S2 Abd:  soft, positive bowel sounds, no organomegally, no rebound, no guarding Ext:  2 plus pulses, no edema, no cyanosis, no clubbing Skin:  No rashes no nodules Neuro:  CN II through XII intact, motor grossly intact  EKG - nsr with lbbb  DEVICE  Normal device function.  See PaceArt for details.   Assess/Plan:

## 2014-11-11 NOTE — Assessment & Plan Note (Signed)
Her blood pressure is well controlled on maximal medical therapy. Will follow. She will maintain a low sodium diet.

## 2014-11-11 NOTE — Assessment & Plan Note (Signed)
Her symptoms are class 3. I have discussed the treatment options with the patient and her family. The risks/benefits/goals/expectations of the procedure and she wishes to proceed. Will schedule BiV ICD implant.

## 2014-11-14 ENCOUNTER — Telehealth: Payer: Self-pay | Admitting: Internal Medicine

## 2014-11-14 NOTE — Telephone Encounter (Signed)
Spoke with patient and she is aware of her instructions

## 2014-11-14 NOTE — Telephone Encounter (Signed)
New problem    Pt has questions about the medications she need to take before her procedure on 6.14.16. Please advise

## 2014-11-18 ENCOUNTER — Ambulatory Visit (HOSPITAL_COMMUNITY)
Admission: RE | Admit: 2014-11-18 | Discharge: 2014-11-19 | Disposition: A | Discharge status: Home / Self Care | Payer: Medicare Other | Source: Ambulatory Visit | Attending: Internal Medicine | Admitting: Internal Medicine

## 2014-11-18 ENCOUNTER — Encounter (HOSPITAL_COMMUNITY)
Admission: RE | Disposition: A | Discharge status: Home / Self Care | Payer: Medicare Other | Source: Ambulatory Visit | Attending: Internal Medicine

## 2014-11-18 ENCOUNTER — Encounter (HOSPITAL_COMMUNITY): Payer: Self-pay | Admitting: General Practice

## 2014-11-18 DIAGNOSIS — I447 Left bundle-branch block, unspecified: Secondary | ICD-10-CM | POA: Diagnosis not present

## 2014-11-18 DIAGNOSIS — Z79899 Other long term (current) drug therapy: Secondary | ICD-10-CM | POA: Insufficient documentation

## 2014-11-18 DIAGNOSIS — E785 Hyperlipidemia, unspecified: Secondary | ICD-10-CM | POA: Diagnosis not present

## 2014-11-18 DIAGNOSIS — Z7982 Long term (current) use of aspirin: Secondary | ICD-10-CM | POA: Diagnosis not present

## 2014-11-18 DIAGNOSIS — I10 Essential (primary) hypertension: Secondary | ICD-10-CM | POA: Diagnosis not present

## 2014-11-18 DIAGNOSIS — I420 Dilated cardiomyopathy: Secondary | ICD-10-CM | POA: Diagnosis not present

## 2014-11-18 DIAGNOSIS — K219 Gastro-esophageal reflux disease without esophagitis: Secondary | ICD-10-CM | POA: Diagnosis not present

## 2014-11-18 DIAGNOSIS — Z794 Long term (current) use of insulin: Secondary | ICD-10-CM | POA: Insufficient documentation

## 2014-11-18 DIAGNOSIS — I5022 Chronic systolic (congestive) heart failure: Secondary | ICD-10-CM | POA: Diagnosis not present

## 2014-11-18 DIAGNOSIS — E119 Type 2 diabetes mellitus without complications: Secondary | ICD-10-CM | POA: Diagnosis not present

## 2014-11-18 DIAGNOSIS — I509 Heart failure, unspecified: Secondary | ICD-10-CM | POA: Diagnosis not present

## 2014-11-18 DIAGNOSIS — I429 Cardiomyopathy, unspecified: Secondary | ICD-10-CM | POA: Diagnosis not present

## 2014-11-18 DIAGNOSIS — I428 Other cardiomyopathies: Secondary | ICD-10-CM

## 2014-11-18 DIAGNOSIS — Z95810 Presence of automatic (implantable) cardiac defibrillator: Secondary | ICD-10-CM

## 2014-11-18 HISTORY — PX: BI-VENTRICULAR IMPLANTABLE CARDIOVERTER DEFIBRILLATOR  (CRT-D): SHX5747

## 2014-11-18 HISTORY — PX: EP IMPLANTABLE DEVICE: SHX172B

## 2014-11-18 HISTORY — DX: Presence of automatic (implantable) cardiac defibrillator: Z95.810

## 2014-11-18 HISTORY — DX: Unspecified osteoarthritis, unspecified site: M19.90

## 2014-11-18 LAB — GLUCOSE, CAPILLARY
GLUCOSE-CAPILLARY: 125 mg/dL — AB (ref 65–99)
GLUCOSE-CAPILLARY: 168 mg/dL — AB (ref 65–99)
Glucose-Capillary: 124 mg/dL — ABNORMAL HIGH (ref 65–99)
Glucose-Capillary: 131 mg/dL — ABNORMAL HIGH (ref 65–99)

## 2014-11-18 LAB — SURGICAL PCR SCREEN
MRSA, PCR: NEGATIVE
Staphylococcus aureus: NEGATIVE

## 2014-11-18 SURGERY — BIV ICD INSERTION CRT-D
Anesthesia: LOCAL

## 2014-11-18 MED ORDER — ATORVASTATIN CALCIUM 20 MG PO TABS
20.0000 mg | ORAL_TABLET | Freq: Every day | ORAL | Status: DC
  Administered 2014-11-18: 20 mg via ORAL
  Filled 2014-11-18 (×2): qty 1

## 2014-11-18 MED ORDER — CEFAZOLIN SODIUM-DEXTROSE 2-3 GM-% IV SOLR
INTRAVENOUS | Status: DC | PRN
  Administered 2014-11-18: 2 g via INTRAVENOUS

## 2014-11-18 MED ORDER — MIDAZOLAM HCL 5 MG/5ML IJ SOLN
INTRAMUSCULAR | Status: AC
  Filled 2014-11-18: qty 5

## 2014-11-18 MED ORDER — SPIRONOLACTONE 25 MG PO TABS
25.0000 mg | ORAL_TABLET | Freq: Every day | ORAL | Status: DC
  Administered 2014-11-18 – 2014-11-19 (×2): 25 mg via ORAL
  Filled 2014-11-18 (×2): qty 1

## 2014-11-18 MED ORDER — IOHEXOL 350 MG/ML SOLN
INTRAVENOUS | Status: DC | PRN
  Administered 2014-11-18: 10 mL via INTRAVENOUS

## 2014-11-18 MED ORDER — CEFAZOLIN SODIUM-DEXTROSE 2-3 GM-% IV SOLR: 2.0000 g | INTRAVENOUS | Status: DC

## 2014-11-18 MED ORDER — POTASSIUM CHLORIDE CRYS ER 20 MEQ PO TBCR
20.0000 meq | EXTENDED_RELEASE_TABLET | Freq: Two times a day (BID) | ORAL | Status: DC
  Administered 2014-11-18 – 2014-11-19 (×2): 20 meq via ORAL
  Filled 2014-11-18 (×3): qty 1

## 2014-11-18 MED ORDER — PANTOPRAZOLE SODIUM 40 MG PO TBEC
40.0000 mg | DELAYED_RELEASE_TABLET | Freq: Every day | ORAL | Status: DC
  Administered 2014-11-18 – 2014-11-19 (×2): 40 mg via ORAL
  Filled 2014-11-18 (×2): qty 1

## 2014-11-18 MED ORDER — IOHEXOL 350 MG/ML SOLN
INTRAVENOUS | Status: DC | PRN
  Administered 2014-11-18: 45 mL via INTRACARDIAC

## 2014-11-18 MED ORDER — SODIUM CHLORIDE 0.9 % IR SOLN
Status: AC
  Filled 2014-11-18: qty 2

## 2014-11-18 MED ORDER — INSULIN DETEMIR 100 UNIT/ML ~~LOC~~ SOLN
12.0000 [IU] | Freq: Every day | SUBCUTANEOUS | Status: DC
  Administered 2014-11-18: 12 [IU] via SUBCUTANEOUS
  Filled 2014-11-18 (×2): qty 0.12

## 2014-11-18 MED ORDER — FUROSEMIDE 40 MG PO TABS
40.0000 mg | ORAL_TABLET | Freq: Every day | ORAL | Status: DC
  Administered 2014-11-19: 40 mg via ORAL
  Filled 2014-11-18 (×2): qty 1

## 2014-11-18 MED ORDER — MUPIROCIN 2 % EX OINT
1.0000 "application " | TOPICAL_OINTMENT | Freq: Once | CUTANEOUS | Status: AC
  Administered 2014-11-18: 1 via TOPICAL
  Filled 2014-11-18: qty 22

## 2014-11-18 MED ORDER — FUROSEMIDE 40 MG PO TABS: 60.0000 mg | ORAL_TABLET | Freq: Every day | ORAL | Status: DC

## 2014-11-18 MED ORDER — CEFAZOLIN SODIUM-DEXTROSE 2-3 GM-% IV SOLR
INTRAVENOUS | Status: AC
  Filled 2014-11-18: qty 50

## 2014-11-18 MED ORDER — CEFAZOLIN SODIUM 1-5 GM-% IV SOLN
1.0000 g | Freq: Four times a day (QID) | INTRAVENOUS | Status: AC
  Administered 2014-11-18 – 2014-11-19 (×3): 1 g via INTRAVENOUS
  Filled 2014-11-18 (×3): qty 50

## 2014-11-18 MED ORDER — FENTANYL CITRATE (PF) 100 MCG/2ML IJ SOLN
INTRAMUSCULAR | Status: AC
  Filled 2014-11-18: qty 2

## 2014-11-18 MED ORDER — GENTAMICIN SULFATE 40 MG/ML IJ SOLN
80.0000 mg | INTRAMUSCULAR | Status: AC
  Administered 2014-11-18: 80 mg
  Filled 2014-11-18: qty 2

## 2014-11-18 MED ORDER — LEVOTHYROXINE SODIUM 75 MCG PO TABS
75.0000 ug | ORAL_TABLET | Freq: Every day | ORAL | Status: DC
  Administered 2014-11-19: 75 ug via ORAL
  Filled 2014-11-18 (×2): qty 1

## 2014-11-18 MED ORDER — CARVEDILOL 3.125 MG PO TABS
3.1250 mg | ORAL_TABLET | Freq: Two times a day (BID) | ORAL | Status: DC
  Administered 2014-11-18 – 2014-11-19 (×2): 3.125 mg via ORAL
  Filled 2014-11-18 (×3): qty 1

## 2014-11-18 MED ORDER — LINAGLIPTIN 5 MG PO TABS
5.0000 mg | ORAL_TABLET | Freq: Every day | ORAL | Status: DC
  Administered 2014-11-18 – 2014-11-19 (×2): 5 mg via ORAL
  Filled 2014-11-18 (×2): qty 1

## 2014-11-18 MED ORDER — CALCIUM GLUCONATE 500 MG PO TABS
500.0000 mg | ORAL_TABLET | Freq: Two times a day (BID) | ORAL | Status: DC
  Administered 2014-11-18 – 2014-11-19 (×2): 500 mg via ORAL
  Filled 2014-11-18 (×3): qty 1

## 2014-11-18 MED ORDER — CHLORHEXIDINE GLUCONATE 4 % EX LIQD
60.0000 mL | Freq: Once | CUTANEOUS | Status: DC
  Filled 2014-11-18: qty 60

## 2014-11-18 MED ORDER — HEPARIN (PORCINE) IN NACL 2-0.9 UNIT/ML-% IJ SOLN
INTRAMUSCULAR | Status: AC
  Filled 2014-11-18: qty 500

## 2014-11-18 MED ORDER — SODIUM CHLORIDE 0.9 % IV SOLN
INTRAVENOUS | Status: DC
  Administered 2014-11-18: 11:00:00 via INTRAVENOUS

## 2014-11-18 MED ORDER — LIDOCAINE HCL (PF) 1 % IJ SOLN
INTRAMUSCULAR | Status: DC | PRN
Start: 2014-11-18 — End: 2014-11-18
  Administered 2014-11-18: 60 mL

## 2014-11-18 MED ORDER — ACETAMINOPHEN 325 MG PO TABS: 325.0000 mg | ORAL_TABLET | ORAL | Status: DC | PRN

## 2014-11-18 MED ORDER — ONDANSETRON HCL 4 MG/2ML IJ SOLN: 4.0000 mg | Freq: Four times a day (QID) | INTRAMUSCULAR | Status: DC | PRN

## 2014-11-18 MED ORDER — POTASSIUM CHLORIDE 20 MEQ PO PACK: 20.0000 meq | PACK | Freq: Two times a day (BID) | ORAL | Status: DC

## 2014-11-18 MED ORDER — FENTANYL CITRATE (PF) 100 MCG/2ML IJ SOLN
INTRAMUSCULAR | Status: DC | PRN
  Administered 2014-11-18: 12.5 ug via INTRAVENOUS
  Administered 2014-11-18: 25 ug via INTRAVENOUS
  Administered 2014-11-18 (×7): 12.5 ug via INTRAVENOUS

## 2014-11-18 MED ORDER — LOSARTAN POTASSIUM 50 MG PO TABS
100.0000 mg | ORAL_TABLET | Freq: Every day | ORAL | Status: DC
  Administered 2014-11-18 – 2014-11-19 (×2): 100 mg via ORAL
  Filled 2014-11-18 (×2): qty 2

## 2014-11-18 MED ORDER — MIDAZOLAM HCL 5 MG/5ML IJ SOLN
INTRAMUSCULAR | Status: DC | PRN
  Administered 2014-11-18 (×10): 1 mg via INTRAVENOUS

## 2014-11-18 MED ORDER — MUPIROCIN 2 % EX OINT
TOPICAL_OINTMENT | CUTANEOUS | Status: AC
  Filled 2014-11-18: qty 22

## 2014-11-18 MED ORDER — AMIODARONE HCL 100 MG PO TABS
100.0000 mg | ORAL_TABLET | Freq: Every day | ORAL | Status: DC
  Administered 2014-11-19: 100 mg via ORAL
  Filled 2014-11-18: qty 1

## 2014-11-18 MED ORDER — LIDOCAINE HCL (PF) 1 % IJ SOLN
INTRAMUSCULAR | Status: AC
  Filled 2014-11-18: qty 60

## 2014-11-18 MED ORDER — FUROSEMIDE 20 MG PO TABS
20.0000 mg | ORAL_TABLET | Freq: Every day | ORAL | Status: DC
  Administered 2014-11-18: 20 mg via ORAL
  Filled 2014-11-18 (×2): qty 1

## 2014-11-18 SURGICAL SUPPLY — 22 items
ASSURA CRTD CD3369-40C (ICD Generator) ×2 IMPLANT
CABLE SURGICAL S-101-97-12 (CABLE) ×1 IMPLANT
CATH CPS DIRECT 135 DS2C020 (CATHETERS) ×1 IMPLANT
CATH CPS QUART SUB DS2N027-59 (CATHETERS) ×1 IMPLANT
CATH HEX JOSEPH 2-5-2 65CM 6F (CATHETERS) ×1 IMPLANT
DEFIB ASSURA CRT-D (ICD Generator) IMPLANT
GUIDEWIRE ANGLED .035X150CM (WIRE) ×2 IMPLANT
KIT ESSENTIALS PG (KITS) ×1 IMPLANT
LEAD DURATA 7122-60CM (Lead) ×1 IMPLANT
LEAD QUARTET 1456Q-86 (Lead) IMPLANT
LEAD TENDRIL SDX 1688TC-52CM (Lead) ×1 IMPLANT
PAD DEFIB LIFELINK (PAD) ×1 IMPLANT
QUARTET 1456Q-86 (Lead) ×2 IMPLANT
SHEATH CLASSIC 9.5F (SHEATH) ×1 IMPLANT
SHEATH CLASSIC 9F (SHEATH) IMPLANT
SHEATH COOK PEEL AWAY SET 7F (SHEATH) ×1 IMPLANT
SHEATH COOK PEEL AWAY SET 8F (SHEATH) ×1 IMPLANT
SHEATH WORLEY 9FR 62CM (SHEATH) ×1 IMPLANT
SHIELD RADPAD SCOOP 12X17 (MISCELLANEOUS) ×1 IMPLANT
TRAY PACEMAKER INSERTION (CUSTOM PROCEDURE TRAY) ×1 IMPLANT
WIRE LUGE 182CM (WIRE) ×1 IMPLANT
WIRE MAILMAN 182CM (WIRE) ×2 IMPLANT

## 2014-11-18 NOTE — H&P (Signed)
  ICD Criteria  Current LVEF:20% ;Obtained < 1 month ago.  NYHA Functional Classification: Class III  Heart Failure History:  Yes, Duration of heart failure since onset is > 9 months  Non-Ischemic Dilated Cardiomyopathy History:  Yes, timeframe is > 9 months  Atrial Fibrillation/Atrial Flutter:  No.  Ventricular Tachycardia History:  No.  Cardiac Arrest History:  No  History of Syndromes with Risk of Sudden Death:  No.  Previous ICD:  No.  Electrophysiology Study: No.  Prior MI: No.  PPM: No.  OSA:  No  Patient Life Expectancy of >=1 year: Yes.  Anticoagulation Therapy:  Patient is NOT on anticoagulation therapy.   Beta Blocker Therapy:  Yes.   Ace Inhibitor/ARB Therapy:  Yes.

## 2014-11-18 NOTE — H&P (View-Only) (Signed)
HPI Donna [name] is referred today for consideration for BiV ICD implant. She is a pleasant 74 yo woman with chronic systolic heart, LBBB, HTN, and a wide QRS tachycardia for which she chronically been on amiodarone in low dose.  Allergies  Allergen Reactions  . Codeine Other (See Comments)    Reaction:  Racing heart   . Decongestant [Pseudoephedrine Hcl Er] Other (See Comments)    Reaction:  Racing heart      Current Outpatient Prescriptions  Medication Sig Dispense Refill  . amiodarone (PACERONE) 200 MG tablet Take 100 mg by mouth daily.    Marland Kitchen aspirin 81 MG tablet Take 81 mg by mouth daily.    Marland Kitchen atorvastatin (LIPITOR) 20 MG tablet Take 1 tablet (20 mg total) by mouth daily at 6 PM. 30 tablet 12  . calcium gluconate 500 MG tablet Take 500 mg by mouth 2 (two) times daily.      . carvedilol (COREG) 3.125 MG tablet Take 1 tablet (3.125 mg total) by mouth 2 (two) times daily. 60 tablet 6  . fish oil-omega-3 fatty acids 1000 MG capsule Take 1 capsule by mouth 3 (three) times daily.      . furosemide (LASIX) 40 MG tablet Take 60 mg by mouth daily. Pt takes 1 tablet in the morning and 0.5 tablet at supper.    . insulin detemir (LEVEMIR) 100 UNIT/ML injection Inject 12 Units into the skin at bedtime.    Marland Kitchen levothyroxine (SYNTHROID, LEVOTHROID) 75 MCG tablet Take 75 mcg by mouth daily before breakfast.    . linagliptin (TRADJENTA) 5 MG TABS tablet Take 5 mg by mouth daily.    Marland Kitchen losartan (COZAAR) 100 MG tablet Take 100 mg by mouth daily.    Marland Kitchen omeprazole (PRILOSEC) 20 MG capsule Take 20 mg by mouth 2 (two) times daily.      . potassium chloride (KLOR-CON) 20 MEQ packet Take 20 mEq by mouth 2 (two) times daily.      Marland Kitchen spironolactone (ALDACTONE) 25 MG tablet Take 25 mg by mouth daily.       No current facility-administered medications for this visit.     Past Medical History  Diagnosis Date  . Cardiomyopathy, nonischemic     Nl cors in 1989 and 1998; CHF in 12/97; EF of 40% in 5/01  and 7/03, 25% in 9/04 and 4/08.  systolic murmur without valvular abnormalities by echo; refused automatic implantable cardiac defibrillator  . Left bundle branch block   . Hypertension   . Hyperlipidemia   . Diabetes mellitus, type II     No insulin  . Cerebrovascular disease     Duplex study in 12/2006 shows atherosclerosis without focal stenosis  . Atrial flutter     Versus ventricular tachycardia; treated with amiodarone  . Gastroesophageal reflux disease   . Ventricular tachycardia     a. PMH it lists "atrial flutter versus ventricular tachycardia treated with amiodarone" - details of this are unclear. Notes from back to 2006 indicate she has been maintained on amiodarone but do not describe further indication. Patient's sister reports pt was told she had skipped beats that could cause her to drop dead - Dr. Lattie Haw put her on amiodarone and recommended a defibrillator.    ROS:   All systems reviewed and negative except as noted in the HPI.   Past Surgical History  Procedure Laterality Date  . Hernia repair      x2  . Abdominal hysterectomy  2006  .  Colonoscopy  2008     Family History  Problem Relation Age of Onset  . Diabetes Mother   . Kidney disease Mother   . Diabetes Sister   . Heart attack Brother      History   Social History  . Marital Status: Widowed    Spouse Name: N/A  . Number of Children: N/A  . Years of Education: N/A   Occupational History  . Retired    Social History Main Topics  . Smoking status: Never Smoker   . Smokeless tobacco: Never Used  . Alcohol Use: No  . Drug Use: No  . Sexual Activity: No   Other Topics Concern  . Not on file   Social History Narrative   Lives in [place] with son and grandson   Physically active     BP 120/68 mmHg  Pulse 63  Ht 5\' 1"  (1.549 m)  Wt 111 lb 3.2 oz (50.44 kg)  BMI 21.02 kg/m2  Physical Exam:  Well appearing 74 yo woman, NAD HEENT: Unremarkable Neck:  No JVD, no  thyromegally Lymphatics:  No adenopathy Back:  No CVA tenderness Lungs:  Clear with no wheezes HEART:  Regular rate rhythm, no murmurs, no rubs, no clicks, split S2 Abd:  soft, positive bowel sounds, no organomegally, no rebound, no guarding Ext:  2 plus pulses, no edema, no cyanosis, no clubbing Skin:  No rashes no nodules Neuro:  CN II through XII intact, motor grossly intact  EKG - nsr with lbbb  DEVICE  Normal device function.  See PaceArt for details.   Assess/Plan:

## 2014-11-18 NOTE — Interval H&P Note (Signed)
History and Physical Interval Note:  11/18/2014 11:23 AM  Donna [name]  has presented today for surgery, with the diagnosis of left bundle branch block, chronic systolic chf  The various methods of treatment have been discussed with the patient and family. After consideration of risks, benefits and other options for treatment, the patient has consented to  Procedure(s): BiV ICD Insertion CRT-D (N/A) as a surgical intervention .  The patient's history has been reviewed, patient examined, no change in status, stable for surgery.  I have reviewed the patient's chart and labs.  Questions were answered to the patient's satisfaction.     Cristopher Peru

## 2014-11-19 ENCOUNTER — Other Ambulatory Visit: Payer: Self-pay | Admitting: *Deleted

## 2014-11-19 ENCOUNTER — Encounter (HOSPITAL_COMMUNITY): Payer: Self-pay | Admitting: Internal Medicine

## 2014-11-19 ENCOUNTER — Ambulatory Visit (HOSPITAL_COMMUNITY): Payer: Medicare Other

## 2014-11-19 ENCOUNTER — Telehealth: Payer: Self-pay | Admitting: Internal Medicine

## 2014-11-19 DIAGNOSIS — E119 Type 2 diabetes mellitus without complications: Secondary | ICD-10-CM | POA: Diagnosis not present

## 2014-11-19 DIAGNOSIS — Z95810 Presence of automatic (implantable) cardiac defibrillator: Secondary | ICD-10-CM | POA: Diagnosis not present

## 2014-11-19 DIAGNOSIS — Z4889 Encounter for other specified surgical aftercare: Secondary | ICD-10-CM | POA: Diagnosis not present

## 2014-11-19 DIAGNOSIS — I10 Essential (primary) hypertension: Secondary | ICD-10-CM | POA: Diagnosis not present

## 2014-11-19 DIAGNOSIS — Z79899 Other long term (current) drug therapy: Secondary | ICD-10-CM | POA: Diagnosis not present

## 2014-11-19 DIAGNOSIS — Z794 Long term (current) use of insulin: Secondary | ICD-10-CM | POA: Diagnosis not present

## 2014-11-19 DIAGNOSIS — K219 Gastro-esophageal reflux disease without esophagitis: Secondary | ICD-10-CM | POA: Diagnosis not present

## 2014-11-19 DIAGNOSIS — Z9889 Other specified postprocedural states: Secondary | ICD-10-CM | POA: Diagnosis not present

## 2014-11-19 DIAGNOSIS — Z7982 Long term (current) use of aspirin: Secondary | ICD-10-CM | POA: Diagnosis not present

## 2014-11-19 DIAGNOSIS — E785 Hyperlipidemia, unspecified: Secondary | ICD-10-CM | POA: Diagnosis not present

## 2014-11-19 DIAGNOSIS — I420 Dilated cardiomyopathy: Secondary | ICD-10-CM | POA: Diagnosis not present

## 2014-11-19 DIAGNOSIS — I447 Left bundle-branch block, unspecified: Secondary | ICD-10-CM | POA: Diagnosis not present

## 2014-11-19 DIAGNOSIS — I5022 Chronic systolic (congestive) heart failure: Secondary | ICD-10-CM | POA: Diagnosis not present

## 2014-11-19 LAB — GLUCOSE, CAPILLARY: Glucose-Capillary: 102 mg/dL — ABNORMAL HIGH (ref 65–99)

## 2014-11-19 MED FILL — Heparin Sodium (Porcine) 2 Unit/ML in Sodium Chloride 0.9%: INTRAMUSCULAR | Qty: 500 | Status: AC

## 2014-11-19 NOTE — Telephone Encounter (Signed)
Pt returning [place] phone call

## 2014-11-19 NOTE — Telephone Encounter (Signed)
New Message  Pt nephew calling about pt's pacemaker and home monitor having compatibilty issues. Please call back and dsicuss.

## 2014-11-19 NOTE — Discharge Instructions (Signed)
° ° °  Supplemental Discharge Instructions for  Pacemaker/Defibrillator Patients  Activity No heavy lifting or vigorous activity with your left/right arm for 6 to 8 weeks.  Do not raise your left/right arm above your head for one week.  Gradually raise your affected arm as drawn below.           __      11/23/14                  11/24/14                     11/25/14                  11/26/14  NO DRIVING for   1 week  ; you may begin driving on  6/76/72   .  WOUND CARE - Keep the wound area clean and dry.  Do not get this area wet for one week. No showers for one week; you may shower on  11/26/14   . - The tape/steri-strips on your wound will fall off; do not pull them off.  No bandage is needed on the site.  DO  NOT apply any creams, oils, or ointments to the wound area. - If you notice any drainage or discharge from the wound, any swelling or bruising at the site, or you develop a fever > 101? F after you are discharged home, call the office at once.  Special Instructions - You are still able to use cellular telephones; use the ear opposite the side where you have your pacemaker/defibrillator.  Avoid carrying your cellular phone near your device. - When traveling through airports, show security personnel your identification card to avoid being screened in the metal detectors.  Ask the security personnel to use the hand wand. - Avoid arc welding equipment, MRI testing (magnetic resonance imaging), TENS units (transcutaneous nerve stimulators).  Call the office for questions about other devices. - Avoid electrical appliances that are in poor condition or are not properly grounded. - Microwave ovens are safe to be near or to operate.  Additional information for defibrillator patients should your device go off: - If your device goes off ONCE and you feel fine afterward, notify the device clinic nurses. - If your device goes off ONCE and you do not feel well afterward, call 911. - If your device  goes off TWICE, call 911. - If your device goes off THREE times in one day, call 911.  DO NOT DRIVE YOURSELF OR A FAMILY MEMBER WITH A DEFIBRILLATOR TO THE HOSPITAL--CALL 911.

## 2014-11-19 NOTE — Patient Outreach (Signed)
[facility]) Care Management  11/19/2014  ASHANI PUMPHREY Mar 08, 1941 [id number]  I spoke with Mrs. Cassata today to follow up on her AICD placement. Dr. Cristopher Peru performed Implantation of a STJ CRTD on 11/18/14. Mrs. Runk said returned to Dr. Tanna Furry office today for follow up and device interrogation and was told that "everything checked out fine." Mrs. Basso reports feeling tired and "worn out" but she denies having had chest pain, shortness of breath, fever, chills, or any concerns with device implantation site.  Mrs. Golab says she has had difficulty with her home monitoring device and has been trying to get in touch with the device company. She is enrolled in [facility] telemonitoring program. Her [employer] telemonitoring contact is Havery Moros RN [phone]. I will contact Ms. Ingle in the morning to provide an update on monitoring status.   [place] Management  [phone]

## 2014-11-19 NOTE — Discharge Summary (Signed)
ELECTROPHYSIOLOGY PROCEDURE DISCHARGE SUMMARY    Patient ID: Donna [name],  MRN: [id number], DOB/AGE: [birth year] 74 y.o.  Admit date: 11/18/2014 Discharge date: 11/19/2014  Primary Care Physician: Alonza Bogus, MD Primary Cardiologist: Harl Bowie Electrophysiologist: Lovena Le  Primary Discharge Diagnosis:  Non ischemic cardiomyopathy, congestive heart failure, and LBBB status post CRTD implantation this admission  Secondary Discharge Diagnosis:  1.  Hypertension 2.  Hyperlipidemia 3.  Diabetes 4.  Atrial flutter vs VT - records are not clear, she has been maintained on amiodarone  Allergies  Allergen Reactions  . Codeine Other (See Comments)    Reaction:  Racing heart   . Decongestant [Pseudoephedrine Hcl Er] Other (See Comments)    Reaction:  Racing heart      Procedures This Admission:  1.  Implantation of a STJ CRTD on 11/18/14 by Dr Lovena Le.  See op note for full details. DFT's were successful at 21 J.  There were no immediate post procedure complications. 2.  CXR on 11/19/14 demonstrated no pneumothorax status post device implantation.   Brief HPI: Donna [name] is a 74 y.o. female was referred to electrophysiology in the outpatient setting for consideration of ICD implantation.  Past medical history includes non ischemic cardiomyopathy, congestive heart failure, and LBBB.  The patient has persistent LV dysfunction despite guideline directed therapy.  Risks, benefits, and alternatives to CRTD implantation were reviewed with the patient who wished to proceed.   Hospital Course:  The patient was admitted and underwent implantation of a STJ CRTD with details as outlined above. She was monitored on telemetry overnight which demonstrated sinus rhythm with ventricular pacing.  Left chest was without hematoma or ecchymosis.  The device was interrogated and found to be functioning normally.  CXR was obtained and demonstrated no pneumothorax status post device implantation.   Wound care, arm mobility, and restrictions were reviewed with the patient.  The patient was examined and considered stable for discharge to home.   The patient's discharge medications include an ARB (Losartan) and beta blocker (Carvedilol).   Physical Exam: Filed Vitals:   11/18/14 1530 11/18/14 1604 11/18/14 1953 11/19/14 0440  BP: 138/57 136/58 114/50 118/55  Pulse: 60 60 66 60  Temp: 97.3 F (36.3 C) 97.4 F (36.3 C) 97.6 F (36.4 C) 97.9 F (36.6 C)  TempSrc: Axillary Axillary Oral Oral  Resp: 18 18 18 19   Height:      Weight:      SpO2: 95% 95% 96% 98%    GEN- The patient is well appearing, alert and oriented x 3 today.   HEENT: normocephalic, atraumatic; sclera clear, conjunctiva pink; hearing intact; oropharynx clear; neck supple, no JVP Lymph- no cervical lymphadenopathy Lungs- Clear to ausculation bilaterally, normal work of breathing.  No wheezes, rales, rhonchi Heart- Regular rate and rhythm, no murmurs, rubs or gallops  GI- soft, non-tender, non-distended, bowel sounds present  Extremities- no clubbing, cyanosis, or edema; DP/PT/radial pulses 2+ bilaterally MS- no significant deformity or atrophy Skin- warm and dry, no rash or lesion, left chest without hematoma/ecchymosis Psych- euthymic mood, full affect Neuro- strength and sensation are intact   Labs:   Lab Results  Component Value Date   WBC 5.9 11/11/2014   HGB 12.3 11/11/2014   HCT 37.1 11/11/2014   MCV 96.3 11/11/2014   PLT 150.0 11/11/2014   No results for input(s): NA, K, CL, CO2, BUN, CREATININE, CALCIUM, PROT, BILITOT, ALKPHOS, ALT, AST, GLUCOSE in the last 168 hours.  Invalid input(s): LABALBU  Discharge  Medications:    Medication List    STOP taking these medications        amiodarone 200 MG tablet  Commonly known as:  PACERONE      TAKE these medications        aspirin 81 MG tablet  Take 81 mg by mouth daily.     atorvastatin 20 MG tablet  Commonly known as:  LIPITOR  Take 1  tablet (20 mg total) by mouth daily at 6 PM.     calcium gluconate 500 MG tablet  Take 500 mg by mouth 2 (two) times daily.     carvedilol 3.125 MG tablet  Commonly known as:  COREG  Take 1 tablet (3.125 mg total) by mouth 2 (two) times daily.     fish oil-omega-3 fatty acids 1000 MG capsule  Take 1 capsule by mouth 3 (three) times daily.     furosemide 40 MG tablet  Commonly known as:  LASIX  Take 20-40 mg by mouth 2 (two) times daily after a meal. Takes 40 mg = 1 tablet at breakfast and 20 mg = 1/2 tablet with supper     insulin detemir 100 UNIT/ML injection  Commonly known as:  LEVEMIR  Inject 12 Units into the skin at bedtime.     levothyroxine 75 MCG tablet  Commonly known as:  SYNTHROID, LEVOTHROID  Take 75 mcg by mouth daily before breakfast.     linagliptin 5 MG Tabs tablet  Commonly known as:  TRADJENTA  Take 5 mg by mouth daily.     losartan 100 MG tablet  Commonly known as:  COZAAR  Take 100 mg by mouth daily.     omeprazole 20 MG capsule  Commonly known as:  PRILOSEC  Take 20 mg by mouth 2 (two) times daily.     potassium chloride 20 MEQ packet  Commonly known as:  KLOR-CON  Take 20 mEq by mouth 2 (two) times daily.     spironolactone 25 MG tablet  Commonly known as:  ALDACTONE  Take 25 mg by mouth daily.        Disposition:  Discharge Instructions    Diet - low sodium heart healthy    Complete by:  As directed      Increase activity slowly    Complete by:  As directed           Follow-up Information    Follow up with [facility] On 11/27/2014.   Why:  at 10AM for wound check   Contact information:   [place] [address]       Follow up with Cristopher Peru, MD On 02/19/2015.   Specialty:  Cardiology   Why:  at 10:15AM   Contact information:   [place] [address] [phone]       Duration of Discharge Encounter: Greater than 30 minutes including physician  time.  Signed, Chanetta Marshall, NP 11/19/2014 8:21 AM      EP Attending  Patient seen and examined. Agree with above. [place] for discharge home. Usual followup. Device is working well.  Mikle Bosworth.D.

## 2014-11-19 NOTE — Telephone Encounter (Signed)
Spoke w/ pt grandson and informed him that I would need to call [employer] support and I'd call him back. He verbalized understanding.

## 2014-11-19 NOTE — Telephone Encounter (Signed)
Spoke w/ pt daughter and informed her that pt ICD is not compatiabile w/ home monitoring at this time but there is a plan for these devices to become compatible w/ home monitoring in the future to keep the monitor for now and that pt will have to come into office every 3 months. Pt daughter verbalized understanding.

## 2014-11-19 NOTE — Telephone Encounter (Signed)
LMOVM for pt to return call 

## 2014-11-20 ENCOUNTER — Other Ambulatory Visit: Payer: Self-pay | Admitting: *Deleted

## 2014-11-27 ENCOUNTER — Ambulatory Visit (INDEPENDENT_AMBULATORY_CARE_PROVIDER_SITE_OTHER): Payer: Medicare Other | Admitting: *Deleted

## 2014-11-27 DIAGNOSIS — I429 Cardiomyopathy, unspecified: Secondary | ICD-10-CM | POA: Diagnosis not present

## 2014-11-27 DIAGNOSIS — I5022 Chronic systolic (congestive) heart failure: Secondary | ICD-10-CM

## 2014-11-27 DIAGNOSIS — Z95810 Presence of automatic (implantable) cardiac defibrillator: Secondary | ICD-10-CM | POA: Diagnosis not present

## 2014-11-27 DIAGNOSIS — I428 Other cardiomyopathies: Secondary | ICD-10-CM

## 2014-11-27 LAB — CUP PACEART INCLINIC DEVICE CHECK
Battery Remaining Longevity: 51.6 mo
Brady Statistic RV Percent Paced: 98 %
HIGH POWER IMPEDANCE MEASURED VALUE: 51.75 Ohm
Lead Channel Impedance Value: 537.5 Ohm
Lead Channel Impedance Value: 550 Ohm
Lead Channel Pacing Threshold Amplitude: 0.5 V
Lead Channel Pacing Threshold Amplitude: 0.75 V
Lead Channel Pacing Threshold Amplitude: 1 V
Lead Channel Pacing Threshold Pulse Width: 0.5 ms
Lead Channel Pacing Threshold Pulse Width: 0.5 ms
Lead Channel Pacing Threshold Pulse Width: 0.5 ms
Lead Channel Pacing Threshold Pulse Width: 0.5 ms
Lead Channel Sensing Intrinsic Amplitude: 11.7 mV
Lead Channel Setting Pacing Amplitude: 3.5 V
Lead Channel Setting Sensing Sensitivity: 0.5 mV
MDC IDC MSMT LEADCHNL LV IMPEDANCE VALUE: 775 Ohm
MDC IDC MSMT LEADCHNL LV PACING THRESHOLD AMPLITUDE: 1 V
MDC IDC MSMT LEADCHNL RA PACING THRESHOLD AMPLITUDE: 0.5 V
MDC IDC MSMT LEADCHNL RA PACING THRESHOLD PULSEWIDTH: 0.5 ms
MDC IDC MSMT LEADCHNL RA SENSING INTR AMPL: 2.8 mV
MDC IDC MSMT LEADCHNL RV PACING THRESHOLD AMPLITUDE: 0.75 V
MDC IDC MSMT LEADCHNL RV PACING THRESHOLD PULSEWIDTH: 0.5 ms
MDC IDC SESS DTM: 20160623104708
MDC IDC SET LEADCHNL LV PACING AMPLITUDE: 3.5 V
MDC IDC SET LEADCHNL LV PACING PULSEWIDTH: 0.5 ms
MDC IDC SET LEADCHNL RA PACING AMPLITUDE: 3.5 V
MDC IDC SET LEADCHNL RV PACING PULSEWIDTH: 0.5 ms
MDC IDC STAT BRADY RA PERCENT PACED: 87 %
Pulse Gen Serial Number: 1210376
Zone Setting Detection Interval: 300 ms
Zone Setting Detection Interval: 350 ms

## 2014-11-27 NOTE — Progress Notes (Signed)
CRT-D (MPP) Wound check appointment. Steri-strips removed. Wound without redness or edema. Incision edges approximated, wound well healed. Normal device function. Thresholds, sensing, and impedances consistent with implant measurements. Device programmed at 3.5V for extra safety margin until 3 month visit. Histogram distribution appropriate for patient and level of activity. Bi-Vpacing 98%. No mode switches or ventricular arrhythmias noted. Patient educated about wound care, arm mobility, lifting restrictions, shock plan. ROV w/ GT/R 02/19/15.

## 2014-12-03 ENCOUNTER — Telehealth: Payer: Self-pay | Admitting: *Deleted

## 2014-12-03 NOTE — Telephone Encounter (Signed)
Per industry, pt's MPP device now FDA approved to pair w/ [employer]. I spoke w/ pt's grandson, Darnelle Maffucci, who ran a test transmission with me. Per Darnelle Maffucci, icons illuminated in order including stars. At time of phone call, remote not received on our end. Darnelle Maffucci aware manual transmissions are not instantly received due to sending through cell signals.   Pt aware ROV w/ Dr. Lovena Le in [place] 02/19/15.

## 2014-12-09 ENCOUNTER — Other Ambulatory Visit: Payer: Self-pay | Admitting: *Deleted

## 2014-12-09 NOTE — Patient Outreach (Signed)
[facility]) Care Management   12/09/2014  Donna [name] 11-Sep-1940 [id number]  Donna [name] is an 74 y.o. female  Subjective: Donna [name] was discharged from [facility] on 5/16 after an unexpected admission for CHF exacerbation. I began following Donna [name] for CHF disease management after referral from her primary care provider Dr. Sinda Du. Donna [name] was very open to new information about self health management and has been exceptionally adherent to the plan of care. Over the last 2 years, device placement has been recommended to Donna [name] for her low output heart failure and high risk for sudden death. She has declined until this recent admission and says she has a better understanding of benefits of device placement.   Donna [name] had BiV/AICD device placement on 11/18/14 and has had an uncomplicated post operative course. She has been back for interrogation of device and received a good report. She says she still feels a little tired and doesn't think she is "over it" (the procedure) yet but is encouraged and feels better about having her new device. She is to see Dr. Luan Pulling (primary care) next week in the office and is excited about letting him see how well she is doing.   Objective:  BP 124/70 mmHg  Pulse 67  Ht 1.549 m (5\' 1" )  Wt 108 lb (48.988 kg)  BMI 20.42 kg/m2  SpO2 98%  Review of Systems  Constitutional: Negative.   HENT: Negative.   Eyes: Negative.   Respiratory: Negative.   Cardiovascular: Negative.   Gastrointestinal: Negative.   Genitourinary: Negative.   Musculoskeletal: Negative.   Skin: Negative.   Neurological: Negative.   Endo/Heme/Allergies: Negative.   Psychiatric/Behavioral: Negative.     Physical Exam  Constitutional: She is oriented to person, place, and time. Vital signs are normal. She appears well-developed and well-nourished. She is active.  Cardiovascular: Normal rate and regular rhythm.     Respiratory: Effort normal and breath sounds normal.  GI: Soft. Bowel sounds are normal.  Neurological: She is alert and oriented to person, place, and time.  Skin: Skin is warm, dry and intact. No bruising and no ecchymosis noted.     Psychiatric: She has a normal mood and affect. Her speech is normal and behavior is normal. Judgment and thought content normal. Cognition and memory are normal.    Current Medications:   Current Outpatient Prescriptions  Medication Sig Dispense Refill  . aspirin 81 MG tablet Take 81 mg by mouth daily.    Marland Kitchen atorvastatin (LIPITOR) 20 MG tablet Take 1 tablet (20 mg total) by mouth daily at 6 PM. 30 tablet 12  . calcium gluconate 500 MG tablet Take 500 mg by mouth 2 (two) times daily.      . carvedilol (COREG) 3.125 MG tablet Take 1 tablet (3.125 mg total) by mouth 2 (two) times daily. 60 tablet 6  . fish oil-omega-3 fatty acids 1000 MG capsule Take 1 capsule by mouth 3 (three) times daily.      . furosemide (LASIX) 40 MG tablet Take 20-40 mg by mouth 2 (two) times daily after a meal. Takes 40 mg = 1 tablet at breakfast and 20 mg = 1/2 tablet with supper    . insulin detemir (LEVEMIR) 100 UNIT/ML injection Inject 12 Units into the skin at bedtime.    Marland Kitchen levothyroxine (SYNTHROID, LEVOTHROID) 75 MCG tablet Take 75 mcg by mouth daily before breakfast.    . linagliptin (TRADJENTA) 5 MG TABS tablet Take 5  mg by mouth daily.    Marland Kitchen losartan (COZAAR) 100 MG tablet Take 100 mg by mouth daily.    Marland Kitchen omeprazole (PRILOSEC) 20 MG capsule Take 20 mg by mouth 2 (two) times daily.      . potassium chloride (KLOR-CON) 20 MEQ packet Take 20 mEq by mouth 2 (two) times daily.      Marland Kitchen spironolactone (ALDACTONE) 25 MG tablet Take 25 mg by mouth daily.       No current facility-administered medications for this visit.    Functional Status:   In your present state of health, do you have any difficulty performing the following activities: 11/18/2014 11/18/2014  Hearing? - N  Vision? -  N  Difficulty concentrating or making decisions? - N  Walking or climbing stairs? - N  Dressing or bathing? - N  Doing errands, shopping? N -  Preparing Food and eating ? - -  Using the Toilet? - -    Fall/Depression Screening:    PHQ 2/9 Scores 09/16/2014  PHQ - 2 Score 0    Assessment:    Chronic Health Condition (CHF) -  taking medications as prescribed, weighing daily, adhering to provider appointment schedule, adhering to fluid restriction, adhering to prescribed diet. Can verbalize s/s CHF exacerbation and when to call for help.   Recent new device placement - doing well and having uneventful post operative course; she has been to provider for follow up and is to see Dr. Luan Pulling (primary  Care) Tuesday.   Plan:   If Donna [name] continues to do well, I will likely only see her a few more times then will transition her to the telephonic case management program. She is exceptionally adherent, very open to new information, and invested in her own health management.         Patient Outreach from 12/09/2014 in [place] Problem  Post operative management of new device   Care Plan for Problem  Active   THN Long Term Goal (31-90) days  over the next 60 days, patient will verbalize understanding of long term plan of care for new device and management of CHF   THN Long Term Goal Start Date  12/09/14   Interventions for Problem Three Long Term Goal  utilizing teachback method and Emmi educational materials, reviewed rationale for device placement as part of treatment plan for CHF   THN CM Short Term Goal #1 (0-30 days)  over the next 30 days patient will attend all scheduled appointments for follow up after device placement   THN CM Short Term Goal #1 Start Date  12/09/14   Interventions for Short Term Goal #1  reviewed scheduled appointments with patient   THN CM Short Term Goal #2 (0-30 days)  over the next 30 days patient will verbalize understanding of  importance of ongoing close monitoring of prescribed heart healthy/low sodium diet, medication adherence, and adherence to previously established plan of care for CHF   [facility] CM Short Term Goal #2 Start Date  12/09/14   Interventions for Short Term Goal #2  utilizing teachback method and Emmi CHF materials, reviewed established plan of care for self health management as it relates to CHF diagnosis   THN CM Short Term Goal #3 (0-30 days)  over the next 30 days patient will verbalize understanding of plan of care and maintenance for newly implanted device   THN  CM Short Term Goal #3 Start Date  12/09/14   Interventions for Short  Term Goal #3  utilizing teachback method and Emmi educational materials, reviewed care and reporting guidelines for new device      [place] Care Management  [phone]

## 2014-12-16 DIAGNOSIS — E1165 Type 2 diabetes mellitus with hyperglycemia: Secondary | ICD-10-CM | POA: Diagnosis not present

## 2014-12-16 DIAGNOSIS — I5022 Chronic systolic (congestive) heart failure: Secondary | ICD-10-CM | POA: Diagnosis not present

## 2014-12-16 DIAGNOSIS — Z95810 Presence of automatic (implantable) cardiac defibrillator: Secondary | ICD-10-CM | POA: Diagnosis not present

## 2014-12-16 DIAGNOSIS — I10 Essential (primary) hypertension: Secondary | ICD-10-CM | POA: Diagnosis not present

## 2014-12-17 ENCOUNTER — Encounter: Payer: Self-pay | Admitting: Internal Medicine

## 2014-12-18 DIAGNOSIS — I5022 Chronic systolic (congestive) heart failure: Secondary | ICD-10-CM | POA: Diagnosis not present

## 2014-12-18 DIAGNOSIS — Z95810 Presence of automatic (implantable) cardiac defibrillator: Secondary | ICD-10-CM | POA: Diagnosis not present

## 2014-12-18 DIAGNOSIS — E1165 Type 2 diabetes mellitus with hyperglycemia: Secondary | ICD-10-CM | POA: Diagnosis not present

## 2014-12-18 DIAGNOSIS — I10 Essential (primary) hypertension: Secondary | ICD-10-CM | POA: Diagnosis not present

## 2014-12-26 ENCOUNTER — Encounter: Payer: Self-pay | Admitting: *Deleted

## 2015-01-08 ENCOUNTER — Encounter: Payer: Self-pay | Admitting: *Deleted

## 2015-01-08 ENCOUNTER — Other Ambulatory Visit: Payer: Self-pay | Admitting: *Deleted

## 2015-01-08 NOTE — Patient Outreach (Signed)
[facility]) Care Management   01/08/2015  Donna [name] 1940/12/03 [id number]  Donna [name] is an 74 y.o. female  Subjective: [name] was discharged from [facility] on 5/16 after an unexpected admission for CHF exacerbation. I began following [name] for CHF disease management after referral from her primary care provider Dr. Sinda [name]. [name] was very open to new information about self health management and has been exceptionally adherent to the plan of care. Over the last 2 years, device placement has been recommended to [name] for her low output heart failure and high risk for sudden death. She has declined until this recent admission and says she has a better understanding of benefits of device placement.   [name] had BiV/AICD device placement on 11/18/14 and has had an uncomplicated post operative course. She has been back for interrogation of device and received a good report. She says she still feels a little tired and doesn't think she is "over it" (the procedure) yet but is encouraged and feels better about having her new device. She is to see Dr. Luan [name] (primary care) next week in the office and is excited about letting him see how well she is doing.   Objective:   Review of Systems  Constitutional: Negative.   HENT: Negative.   Eyes: Negative.   Respiratory: Negative.   Cardiovascular: Negative.   Gastrointestinal: Negative.   Genitourinary: Negative.   Musculoskeletal: Negative.   Skin: Negative.   Neurological: Negative.   Endo/Heme/Allergies: Negative.   Psychiatric/Behavioral: Negative.     Physical Exam  Constitutional: She is oriented to person, place, and time. Vital signs are normal. She appears well-developed and well-nourished. She is active.  Cardiovascular: Normal rate and regular rhythm.   No murmur heard. Device pocket unremarkable  Respiratory: Effort normal.  GI: Soft.  Neurological:  She is alert and oriented to person, place, and time.  Skin: Skin is warm, dry and intact.  Psychiatric: She has a normal mood and affect. Her speech is normal and behavior is normal. Judgment and thought content normal. Cognition and memory are normal.    Current Medications:   Current Outpatient Prescriptions  Medication Sig Dispense Refill  . aspirin 81 MG tablet Take 81 mg by mouth daily.    Marland Kitchen atorvastatin (LIPITOR) 20 MG tablet Take 1 tablet (20 mg total) by mouth daily at 6 PM. 30 tablet 12  . calcium gluconate 500 MG tablet Take 500 mg by mouth 2 (two) times daily.      . carvedilol (COREG) 3.125 MG tablet Take 1 tablet (3.125 mg total) by mouth 2 (two) times daily. 60 tablet 6  . fish oil-omega-3 fatty acids 1000 MG capsule Take 1 capsule by mouth 3 (three) times daily.      . furosemide (LASIX) 40 MG tablet Take 20-40 mg by mouth 2 (two) times daily after a meal. Takes 40 mg = 1 tablet at breakfast and 20 mg = 1/2 tablet with supper    . insulin detemir (LEVEMIR) 100 UNIT/ML injection Inject 12 Units into the skin at bedtime.    Marland Kitchen levothyroxine (SYNTHROID, LEVOTHROID) 75 MCG tablet Take 75 mcg by mouth daily before breakfast.    . linagliptin (TRADJENTA) 5 MG TABS tablet Take 5 mg by mouth daily.    Marland Kitchen losartan (COZAAR) 100 MG tablet Take 100 mg by mouth daily.    Marland Kitchen omeprazole (PRILOSEC) 20 MG capsule Take 20 mg by mouth 2 (two) times  daily.      . potassium chloride (KLOR-CON) 20 MEQ packet Take 20 mEq by mouth 2 (two) times daily.      Marland Kitchen spironolactone (ALDACTONE) 25 MG tablet Take 25 mg by mouth daily.          Assessment:    Chronic Health Condition (CHF) - taking medications as prescribed, weighing daily, adhering to provider appointment schedule, adhering to fluid restriction, adhering to prescribed diet. Can verbalize s/s CHF exacerbation and when to call for help.   Recent new device placement - doing well and having uneventful post operative course.   Plan:   If Mrs.  Donna [name] continues to do well, I will transition her to the telephonic case management program after her appointment with cardiology in 2 weeks. She is exceptionally adherent, very open to new information, and invested in her own health management.   Donna [name]         Patient Outreach from 01/08/2015 in [place] Problem Three  Post operative management of new device   Care Plan for Problem Three  Active   THN Long Term Goal (31-90) days  over the next 60 days, patient will verbalize understanding of long term plan of care for new device and management of CHF   THN Long Term Goal Start Date  12/09/14   Interventions for Problem Three Long Term Goal  utilizing teachback method and Emmi educational materials, reviewed rationale for device placement as part of treatment plan for CHF   THN CM Short Term Goal #1 (0-30 days)  over the next 30 days patient will attend all scheduled appointments for follow up after device placement   THN CM Short Term Goal #1 Start Date  01/08/15   THN CM Short Term Goal #1 Met Date     Interventions for Short Term Goal #1  reviewed scheduled appointments with patient   THN CM Short Term Goal #2 (0-30 days)  over the next 30 days patient will verbalize understanding of importance of ongoing close monitoring of prescribed heart healthy/low sodium diet, medication adherence, and adherence to previously established plan of care for CHF   [facility] CM Short Term Goal #2 Start Date  01/08/15   [facility] CM Short Term Goal #2 Met Date          [place] Care Management  [phone]

## 2015-01-18 ENCOUNTER — Other Ambulatory Visit: Payer: Self-pay | Admitting: Cardiology

## 2015-02-05 ENCOUNTER — Telehealth: Payer: Self-pay | Admitting: Cardiology

## 2015-02-05 NOTE — Telephone Encounter (Signed)
Error/tg °

## 2015-02-18 ENCOUNTER — Encounter: Payer: Self-pay | Admitting: Internal Medicine

## 2015-02-19 ENCOUNTER — Encounter: Payer: Self-pay | Admitting: Internal Medicine

## 2015-02-19 ENCOUNTER — Ambulatory Visit (INDEPENDENT_AMBULATORY_CARE_PROVIDER_SITE_OTHER): Payer: Medicare Other | Admitting: Internal Medicine

## 2015-02-19 VITALS — BP 116/62 | HR 69 | Ht 61.0 in | Wt 113.5 lb

## 2015-02-19 DIAGNOSIS — I10 Essential (primary) hypertension: Secondary | ICD-10-CM

## 2015-02-19 DIAGNOSIS — I5023 Acute on chronic systolic (congestive) heart failure: Secondary | ICD-10-CM | POA: Diagnosis not present

## 2015-02-19 DIAGNOSIS — I429 Cardiomyopathy, unspecified: Secondary | ICD-10-CM

## 2015-02-19 DIAGNOSIS — I428 Other cardiomyopathies: Secondary | ICD-10-CM

## 2015-02-19 DIAGNOSIS — I472 Ventricular tachycardia: Secondary | ICD-10-CM

## 2015-02-19 DIAGNOSIS — I4729 Other ventricular tachycardia: Secondary | ICD-10-CM

## 2015-02-19 LAB — CUP PACEART INCLINIC DEVICE CHECK
Brady Statistic RA Percent Paced: 90 %
Brady Statistic RV Percent Paced: 99 %
HighPow Impedance: 58.5 Ohm
Lead Channel Impedance Value: 562.5 Ohm
Lead Channel Impedance Value: 850 Ohm
Lead Channel Pacing Threshold Amplitude: 0.5 V
Lead Channel Pacing Threshold Amplitude: 0.75 V
Lead Channel Pacing Threshold Amplitude: 1 V
Lead Channel Pacing Threshold Amplitude: 1 V
Lead Channel Pacing Threshold Pulse Width: 0.5 ms
Lead Channel Pacing Threshold Pulse Width: 0.5 ms
Lead Channel Pacing Threshold Pulse Width: 0.5 ms
Lead Channel Pacing Threshold Pulse Width: 0.5 ms
Lead Channel Sensing Intrinsic Amplitude: 11.7 mV
Lead Channel Sensing Intrinsic Amplitude: 2.3 mV
Lead Channel Setting Pacing Amplitude: 2.5 V
Lead Channel Setting Pacing Pulse Width: 0.5 ms
Lead Channel Setting Pacing Pulse Width: 0.5 ms
Lead Channel Setting Sensing Sensitivity: 0.5 mV
MDC IDC MSMT BATTERY REMAINING LONGEVITY: 79.2 mo
MDC IDC MSMT LEADCHNL LV PACING THRESHOLD PULSEWIDTH: 0.5 ms
MDC IDC MSMT LEADCHNL RA PACING THRESHOLD AMPLITUDE: 0.5 V
MDC IDC MSMT LEADCHNL RA PACING THRESHOLD PULSEWIDTH: 0.5 ms
MDC IDC MSMT LEADCHNL RV IMPEDANCE VALUE: 575 Ohm
MDC IDC MSMT LEADCHNL RV PACING THRESHOLD AMPLITUDE: 0.75 V
MDC IDC PG SERIAL: 1210376
MDC IDC SESS DTM: 20160915142608
MDC IDC SET LEADCHNL LV PACING AMPLITUDE: 2 V
MDC IDC SET LEADCHNL RA PACING AMPLITUDE: 2 V
Zone Setting Detection Interval: 300 ms
Zone Setting Detection Interval: 350 ms

## 2015-02-19 NOTE — Assessment & Plan Note (Signed)
Her blood pressure is well controlled. She will continue her current meds.  

## 2015-02-19 NOTE — Assessment & Plan Note (Signed)
Since her ICD Implant, she has had no ventricular tachycardia. She will continue low dose amiodarone.

## 2015-02-19 NOTE — Progress Notes (Signed)
HPI Mrs. Somoza returns today for followup after her BiV ICD implant. She is a pleasant 74 yo woman with chronic systolic heart, LBBB, HTN, and a wide QRS tachycardia for which she chronically been on amiodarone in low dose. She underwent BiV ICD implant approx. 3 months ago. In the interim, she has done well. No chest pain or sob. She is walking . She notes some tingling in her legs.  Allergies  Allergen Reactions  . Codeine Other (See Comments)    Reaction:  Racing heart   . Decongestant [Pseudoephedrine Hcl Er] Other (See Comments)    Reaction:  Racing heart      Current Outpatient Prescriptions  Medication Sig Dispense Refill  . aspirin 81 MG tablet Take 81 mg by mouth daily.    Marland Kitchen atorvastatin (LIPITOR) 20 MG tablet Take 1 tablet (20 mg total) by mouth daily at 6 PM. 30 tablet 12  . calcium gluconate 500 MG tablet Take 500 mg by mouth 2 (two) times daily.      . carvedilol (COREG) 3.125 MG tablet TAKE ONE TABLET TWICE DAILY 60 tablet 3  . fish oil-omega-3 fatty acids 1000 MG capsule Take 1 capsule by mouth 3 (three) times daily.      . furosemide (LASIX) 40 MG tablet Take 20-40 mg by mouth 2 (two) times daily after a meal. Takes 40 mg = 1 tablet at breakfast and 20 mg = 1/2 tablet with supper    . insulin detemir (LEVEMIR) 100 UNIT/ML injection Inject 12 Units into the skin at bedtime.    Marland Kitchen levothyroxine (SYNTHROID, LEVOTHROID) 75 MCG tablet Take 75 mcg by mouth daily before breakfast.    . linagliptin (TRADJENTA) 5 MG TABS tablet Take 5 mg by mouth daily.    Marland Kitchen losartan (COZAAR) 100 MG tablet Take 100 mg by mouth daily.    Marland Kitchen omeprazole (PRILOSEC) 20 MG capsule Take 20 mg by mouth 2 (two) times daily.      . potassium chloride (KLOR-CON) 20 MEQ packet Take 20 mEq by mouth 2 (two) times daily.      Marland Kitchen spironolactone (ALDACTONE) 25 MG tablet Take 25 mg by mouth daily.       No current facility-administered medications for this visit.     Past Medical History  Diagnosis  Date  . Cardiomyopathy, nonischemic     Nl cors in 1989 and 1998; CHF in 12/97; EF of 40% in 5/01 and 7/03, 25% in 9/04 and 4/08.  systolic murmur without valvular abnormalities by echo; refused automatic implantable cardiac defibrillator  . Left bundle branch block   . Hypertension   . Hyperlipidemia   . Cerebrovascular disease     Duplex study in 12/2006 shows atherosclerosis without focal stenosis  . Atrial flutter     Versus ventricular tachycardia; treated with amiodarone  . Gastroesophageal reflux disease   . Ventricular tachycardia     a. PMH it lists "atrial flutter versus ventricular tachycardia treated with amiodarone" - details of this are unclear. Notes from back to 2006 indicate she has been maintained on amiodarone but do not describe further indication. Patient's sister reports pt was told she had skipped beats that could cause her to drop dead - Dr. Lattie Haw put her on amiodarone and recommended a defibrillator.  Marland Kitchen AICD (automatic cardioverter/defibrillator) present   . Diabetes mellitus, type II     No insulin  . CHF (congestive heart failure)   . Arthritis     "fingers" (11/18/2014)  ROS:   All systems reviewed and negative except as noted in the HPI.   Past Surgical History  Procedure Laterality Date  . Colonoscopy  2008  . Bi-ventricular implantable cardioverter defibrillator  (crt-d)  11/18/2014  . Tonsillectomy    . Hernia repair Right   . Abdominal hysterectomy  2006  . Ep implantable device N/A 11/18/2014    Procedure: BiV ICD Insertion CRT-D;  Surgeon: Evans Lance, MD;  Location: [facility];  Service: Cardiovascular;  Laterality: N/A;     Family History  Problem Relation Age of Onset  . Diabetes Mother   . Kidney disease Mother   . Diabetes Sister   . Heart attack Brother      Social History   Social History  . Marital Status: Widowed    Spouse Name: N/A  . Number of Children: N/A  . Years of Education: N/A   Occupational  History  . Retired    Social History Main Topics  . Smoking status: Never Smoker   . Smokeless tobacco: Never Used  . Alcohol Use: No  . Drug Use: No  . Sexual Activity: No   Other Topics Concern  . Not on file   Social History Narrative   Lives in [place] with son and grandson   Physically active     BP 116/62 mmHg  Pulse 69  Ht 5\' 1"  (1.549 m)  Wt 113 lb 8 oz (51.483 kg)  BMI 21.46 kg/m2  SpO2 99%  Physical Exam:  Well appearing 74 yo woman, NAD HEENT: Unremarkable Neck:  No JVD, no thyromegally Lymphatics:  No adenopathy Back:  No CVA tenderness Lungs:  Clear with no wheezes. Well healed ICD incision. HEART:  Regular rate rhythm, no murmurs, no rubs, no clicks, split S2 Abd:  soft, positive bowel sounds, no organomegally, no rebound, no guarding Ext:  2 plus pulses, no edema, no cyanosis, no clubbing Skin:  No rashes no nodules Neuro:  CN II through XII intact, motor grossly intact  EKG - nsr with lbbb  DEVICE  Normal device function.  See PaceArt for details.   Assess/Plan:

## 2015-02-19 NOTE — Assessment & Plan Note (Signed)
Her symptoms are class 2. She will continue her current meds, increase her physical activity and reduce her sodium intake.

## 2015-02-19 NOTE — Patient Instructions (Signed)
Your physician wants you to follow-up in: 9 months with Dr. Lovena Le. You will receive a reminder letter in the mail two months in advance. If you don't receive a letter, please call our office to schedule the follow-up appointment.   Remote monitoring is used to monitor your Pacemaker of ICD from home. This monitoring reduces the number of office visits required to check your device to one time per year. It allows [place] to keep an eye on the functioning of your device to ensure it is working properly. You are scheduled for a device check from home on 05/21/15.. You may send your transmission at any time that day. If you have a wireless device, the transmission will be sent automatically. After your physician reviews your transmission, you will receive a postcard with your next transmission date.   Your physician recommends that you continue on your current medications as directed. Please refer to the Current Medication list given to you today.   Thank you for choosing [facility]!

## 2015-03-20 DIAGNOSIS — E785 Hyperlipidemia, unspecified: Secondary | ICD-10-CM | POA: Diagnosis not present

## 2015-03-20 DIAGNOSIS — E039 Hypothyroidism, unspecified: Secondary | ICD-10-CM | POA: Diagnosis not present

## 2015-03-20 DIAGNOSIS — E1165 Type 2 diabetes mellitus with hyperglycemia: Secondary | ICD-10-CM | POA: Diagnosis not present

## 2015-03-20 DIAGNOSIS — Z23 Encounter for immunization: Secondary | ICD-10-CM | POA: Diagnosis not present

## 2015-03-20 DIAGNOSIS — Z0000 Encounter for general adult medical examination without abnormal findings: Secondary | ICD-10-CM | POA: Diagnosis not present

## 2015-03-20 DIAGNOSIS — F419 Anxiety disorder, unspecified: Secondary | ICD-10-CM | POA: Diagnosis not present

## 2015-05-07 ENCOUNTER — Encounter: Payer: Self-pay | Admitting: *Deleted

## 2015-05-15 ENCOUNTER — Other Ambulatory Visit: Payer: Self-pay | Admitting: Cardiology

## 2015-05-16 ENCOUNTER — Emergency Department (HOSPITAL_COMMUNITY): Payer: Medicare Other

## 2015-05-16 ENCOUNTER — Inpatient Hospital Stay (HOSPITAL_COMMUNITY)
Admission: EM | Admit: 2015-05-16 | Discharge: 2015-05-18 | DRG: 293 | Disposition: A | Discharge status: Home / Self Care | Payer: Medicare Other | Attending: Pulmonary Disease | Admitting: Pulmonary Disease

## 2015-05-16 ENCOUNTER — Encounter (HOSPITAL_COMMUNITY): Payer: Self-pay | Admitting: Emergency Medicine

## 2015-05-16 DIAGNOSIS — Z7982 Long term (current) use of aspirin: Secondary | ICD-10-CM

## 2015-05-16 DIAGNOSIS — I428 Other cardiomyopathies: Secondary | ICD-10-CM

## 2015-05-16 DIAGNOSIS — I110 Hypertensive heart disease with heart failure: Principal | ICD-10-CM | POA: Diagnosis present

## 2015-05-16 DIAGNOSIS — E119 Type 2 diabetes mellitus without complications: Secondary | ICD-10-CM

## 2015-05-16 DIAGNOSIS — Z8249 Family history of ischemic heart disease and other diseases of the circulatory system: Secondary | ICD-10-CM

## 2015-05-16 DIAGNOSIS — E785 Hyperlipidemia, unspecified: Secondary | ICD-10-CM | POA: Diagnosis present

## 2015-05-16 DIAGNOSIS — I10 Essential (primary) hypertension: Secondary | ICD-10-CM | POA: Diagnosis not present

## 2015-05-16 DIAGNOSIS — Z833 Family history of diabetes mellitus: Secondary | ICD-10-CM

## 2015-05-16 DIAGNOSIS — I5023 Acute on chronic systolic (congestive) heart failure: Secondary | ICD-10-CM | POA: Diagnosis not present

## 2015-05-16 DIAGNOSIS — K219 Gastro-esophageal reflux disease without esophagitis: Secondary | ICD-10-CM | POA: Diagnosis present

## 2015-05-16 DIAGNOSIS — I447 Left bundle-branch block, unspecified: Secondary | ICD-10-CM | POA: Diagnosis present

## 2015-05-16 DIAGNOSIS — I429 Cardiomyopathy, unspecified: Secondary | ICD-10-CM | POA: Diagnosis not present

## 2015-05-16 DIAGNOSIS — R0602 Shortness of breath: Secondary | ICD-10-CM | POA: Diagnosis not present

## 2015-05-16 DIAGNOSIS — Z90710 Acquired absence of both cervix and uterus: Secondary | ICD-10-CM | POA: Diagnosis not present

## 2015-05-16 DIAGNOSIS — Z794 Long term (current) use of insulin: Secondary | ICD-10-CM

## 2015-05-16 DIAGNOSIS — E876 Hypokalemia: Secondary | ICD-10-CM | POA: Diagnosis present

## 2015-05-16 DIAGNOSIS — Z95810 Presence of automatic (implantable) cardiac defibrillator: Secondary | ICD-10-CM

## 2015-05-16 DIAGNOSIS — I509 Heart failure, unspecified: Secondary | ICD-10-CM | POA: Diagnosis not present

## 2015-05-16 LAB — BRAIN NATRIURETIC PEPTIDE: B NATRIURETIC PEPTIDE 5: 684 pg/mL — AB (ref 0.0–100.0)

## 2015-05-16 LAB — COMPREHENSIVE METABOLIC PANEL
ALT: 21 U/L (ref 14–54)
ANION GAP: 11 (ref 5–15)
AST: 22 U/L (ref 15–41)
Albumin: 4.5 g/dL (ref 3.5–5.0)
Alkaline Phosphatase: 64 U/L (ref 38–126)
BUN: 25 mg/dL — ABNORMAL HIGH (ref 6–20)
CO2: 29 mmol/L (ref 22–32)
CREATININE: 0.67 mg/dL (ref 0.44–1.00)
Calcium: 9.6 mg/dL (ref 8.9–10.3)
Chloride: 98 mmol/L — ABNORMAL LOW (ref 101–111)
Glucose, Bld: 131 mg/dL — ABNORMAL HIGH (ref 65–99)
Potassium: 4.4 mmol/L (ref 3.5–5.1)
Sodium: 138 mmol/L (ref 135–145)
Total Bilirubin: 0.8 mg/dL (ref 0.3–1.2)
Total Protein: 7.6 g/dL (ref 6.5–8.1)

## 2015-05-16 LAB — CBC WITH DIFFERENTIAL/PLATELET
Basophils Absolute: 0 10*3/uL (ref 0.0–0.1)
Basophils Relative: 0 %
EOS PCT: 1 %
Eosinophils Absolute: 0.1 10*3/uL (ref 0.0–0.7)
HCT: 40.2 % (ref 36.0–46.0)
Hemoglobin: 13.2 g/dL (ref 12.0–15.0)
LYMPHS PCT: 20 %
Lymphs Abs: 1.8 10*3/uL (ref 0.7–4.0)
MCH: 32.6 pg (ref 26.0–34.0)
MCHC: 32.8 g/dL (ref 30.0–36.0)
MCV: 99.3 fL (ref 78.0–100.0)
MONO ABS: 0.5 10*3/uL (ref 0.1–1.0)
Monocytes Relative: 6 %
Neutro Abs: 6.6 10*3/uL (ref 1.7–7.7)
Neutrophils Relative %: 73 %
Platelets: 205 10*3/uL (ref 150–400)
RBC: 4.05 MIL/uL (ref 3.87–5.11)
RDW: 13.5 % (ref 11.5–15.5)
WBC: 9 10*3/uL (ref 4.0–10.5)

## 2015-05-16 LAB — GLUCOSE, CAPILLARY
GLUCOSE-CAPILLARY: 112 mg/dL — AB (ref 65–99)
GLUCOSE-CAPILLARY: 159 mg/dL — AB (ref 65–99)

## 2015-05-16 LAB — TROPONIN I
TROPONIN I: 0.05 ng/mL — AB (ref <0.031)
TROPONIN I: 0.05 ng/mL — AB (ref <0.031)
TROPONIN I: 0.04 ng/mL — AB (ref <0.031)
Troponin I: 0.05 ng/mL — ABNORMAL HIGH (ref <0.031)

## 2015-05-16 MED ORDER — LOSARTAN POTASSIUM 50 MG PO TABS
100.0000 mg | ORAL_TABLET | Freq: Every day | ORAL | Status: DC
  Administered 2015-05-16 – 2015-05-18 (×3): 100 mg via ORAL
  Filled 2015-05-16 (×3): qty 2

## 2015-05-16 MED ORDER — INSULIN DETEMIR 100 UNIT/ML ~~LOC~~ SOLN
12.0000 [IU] | Freq: Every day | SUBCUTANEOUS | Status: DC
  Administered 2015-05-17: 12 [IU] via SUBCUTANEOUS
  Filled 2015-05-16 (×3): qty 0.12

## 2015-05-16 MED ORDER — LOSARTAN POTASSIUM 50 MG PO TABS: 100.0000 mg | ORAL_TABLET | Freq: Every day | ORAL | Status: DC

## 2015-05-16 MED ORDER — INSULIN ASPART 100 UNIT/ML ~~LOC~~ SOLN
0.0000 [IU] | Freq: Three times a day (TID) | SUBCUTANEOUS | Status: DC
  Administered 2015-05-17: 3 [IU] via SUBCUTANEOUS
  Administered 2015-05-17 – 2015-05-18 (×2): 2 [IU] via SUBCUTANEOUS

## 2015-05-16 MED ORDER — LINAGLIPTIN 5 MG PO TABS
5.0000 mg | ORAL_TABLET | Freq: Every day | ORAL | Status: DC
  Administered 2015-05-16 – 2015-05-18 (×3): 5 mg via ORAL
  Filled 2015-05-16 (×3): qty 1

## 2015-05-16 MED ORDER — SPIRONOLACTONE 25 MG PO TABS
25.0000 mg | ORAL_TABLET | Freq: Every day | ORAL | Status: DC
Start: 2015-05-16 — End: 2015-05-18
  Administered 2015-05-16 – 2015-05-18 (×3): 25 mg via ORAL
  Filled 2015-05-16 (×3): qty 1

## 2015-05-16 MED ORDER — PANTOPRAZOLE SODIUM 40 MG PO TBEC
40.0000 mg | DELAYED_RELEASE_TABLET | Freq: Every day | ORAL | Status: DC
  Administered 2015-05-16 – 2015-05-18 (×3): 40 mg via ORAL
  Filled 2015-05-16 (×3): qty 1

## 2015-05-16 MED ORDER — ASPIRIN EC 81 MG PO TBEC
81.0000 mg | DELAYED_RELEASE_TABLET | Freq: Every day | ORAL | Status: DC
  Administered 2015-05-16 – 2015-05-18 (×3): 81 mg via ORAL
  Filled 2015-05-16 (×3): qty 1

## 2015-05-16 MED ORDER — POTASSIUM CHLORIDE 20 MEQ PO PACK
20.0000 meq | PACK | Freq: Two times a day (BID) | ORAL | Status: DC
  Administered 2015-05-17 – 2015-05-18 (×3): 20 meq via ORAL
  Filled 2015-05-16 (×3): qty 1

## 2015-05-16 MED ORDER — CARVEDILOL 3.125 MG PO TABS
3.1250 mg | ORAL_TABLET | Freq: Two times a day (BID) | ORAL | Status: DC
  Administered 2015-05-16 – 2015-05-18 (×4): 3.125 mg via ORAL
  Filled 2015-05-16 (×4): qty 1

## 2015-05-16 MED ORDER — ATORVASTATIN CALCIUM 20 MG PO TABS
20.0000 mg | ORAL_TABLET | Freq: Every day | ORAL | Status: DC
  Administered 2015-05-16 – 2015-05-17 (×2): 20 mg via ORAL
  Filled 2015-05-16 (×2): qty 1

## 2015-05-16 MED ORDER — ONDANSETRON HCL 4 MG PO TABS: 4.0000 mg | ORAL_TABLET | Freq: Four times a day (QID) | ORAL | Status: DC | PRN

## 2015-05-16 MED ORDER — CALCIUM GLUCONATE 500 MG PO TABS
500.0000 mg | ORAL_TABLET | Freq: Two times a day (BID) | ORAL | Status: DC
  Filled 2015-05-16 (×4): qty 1

## 2015-05-16 MED ORDER — OMEGA-3 FATTY ACIDS 1000 MG PO CAPS: 1.0000 | ORAL_CAPSULE | Freq: Three times a day (TID) | ORAL | Status: DC

## 2015-05-16 MED ORDER — SODIUM CHLORIDE 0.9 % IJ SOLN
3.0000 mL | Freq: Two times a day (BID) | INTRAMUSCULAR | Status: DC
  Administered 2015-05-17: 3 mL via INTRAVENOUS

## 2015-05-16 MED ORDER — INSULIN ASPART 100 UNIT/ML ~~LOC~~ SOLN: 0.0000 [IU] | Freq: Every day | SUBCUTANEOUS | Status: DC

## 2015-05-16 MED ORDER — ENOXAPARIN SODIUM 40 MG/0.4ML ~~LOC~~ SOLN
40.0000 mg | SUBCUTANEOUS | Status: DC
  Administered 2015-05-16 – 2015-05-17 (×2): 40 mg via SUBCUTANEOUS
  Filled 2015-05-16 (×2): qty 0.4

## 2015-05-16 MED ORDER — ASPIRIN 81 MG PO TABS: 81.0000 mg | ORAL_TABLET | Freq: Every day | ORAL | Status: DC

## 2015-05-16 MED ORDER — ACETAMINOPHEN 500 MG PO TABS
500.0000 mg | ORAL_TABLET | Freq: Four times a day (QID) | ORAL | Status: DC | PRN
  Administered 2015-05-18: 500 mg via ORAL
  Filled 2015-05-16: qty 1

## 2015-05-16 MED ORDER — FUROSEMIDE 10 MG/ML IJ SOLN
40.0000 mg | Freq: Two times a day (BID) | INTRAMUSCULAR | Status: DC
  Administered 2015-05-16 – 2015-05-17 (×2): 40 mg via INTRAVENOUS
  Filled 2015-05-16 (×4): qty 4

## 2015-05-16 MED ORDER — LEVOTHYROXINE SODIUM 75 MCG PO TABS
75.0000 ug | ORAL_TABLET | Freq: Every day | ORAL | Status: DC
  Administered 2015-05-16 – 2015-05-18 (×3): 75 ug via ORAL
  Filled 2015-05-16 (×3): qty 1

## 2015-05-16 MED ORDER — OMEGA-3-ACID ETHYL ESTERS 1 G PO CAPS
1.0000 g | ORAL_CAPSULE | Freq: Two times a day (BID) | ORAL | Status: DC
  Administered 2015-05-16 – 2015-05-18 (×4): 1 g via ORAL
  Filled 2015-05-16 (×4): qty 1

## 2015-05-16 MED ORDER — FUROSEMIDE 10 MG/ML IJ SOLN
80.0000 mg | Freq: Once | INTRAMUSCULAR | Status: AC
Start: 2015-05-16 — End: 2015-05-16
  Administered 2015-05-16: 80 mg via INTRAVENOUS
  Filled 2015-05-16: qty 8

## 2015-05-16 MED ORDER — ONDANSETRON HCL 4 MG/2ML IJ SOLN
4.0000 mg | Freq: Four times a day (QID) | INTRAMUSCULAR | Status: DC | PRN
  Administered 2015-05-17: 4 mg via INTRAVENOUS
  Filled 2015-05-16: qty 2

## 2015-05-16 NOTE — ED Notes (Signed)
PT c/o SOB on exertion x1 day with a congested non-productive cough.

## 2015-05-16 NOTE — ED Provider Notes (Addendum)
CSN: [id number]     Arrival date & time 05/16/15  [id number] History  By signing my name below, I, Donna [name], attest that this documentation has been prepared under the direction and in the presence of Donna Sorrow, MD. Electronically Signed: Hilda [name], ED Scribe. 05/16/2015. 10:08 AM.    Chief Complaint  Patient presents with  . Shortness of Breath     Patient is a 74 y.o. female presenting with shortness of breath. The history is provided by the patient. No language interpreter was used.  Shortness of Breath Severity:  Moderate Onset quality:  Gradual Duration:  10 hours Timing:  Constant Progression:  Worsening Chronicity:  New Relieved by:  None tried Worsened by:  Exertion Ineffective treatments:  None tried Associated symptoms: no abdominal pain, no chest pain, no cough, no fever, no headaches, no rash and no vomiting    HPI Comments: Donna [name] is a 74 y.o. female with a who presents to the Emergency Department complaining of constant, worsening SOB with asociated congestion and rhinorrhea that has been present since last night. Pt denies having any cold symptoms lately. Pt denies cough, chest pain, fever, chills, visual changes, abdominal pain, nausea, vomiting, diarrhea, dysuria, leg swelling, rash.    Past Medical History  Diagnosis Date  . Cardiomyopathy, nonischemic ([place])     Nl cors in [place]; CHF in 12/97; EF of 40% in 5/01 and 7/03, 25% in 9/04 and 4/08.  systolic murmur without valvular abnormalities by echo; refused automatic implantable cardiac defibrillator  . Left bundle branch block   . Hypertension   . Hyperlipidemia   . Cerebrovascular disease     Duplex study in 12/2006 shows atherosclerosis without focal stenosis  . Atrial flutter ([facility])     Versus ventricular tachycardia; treated with amiodarone  . Gastroesophageal reflux disease   . Ventricular tachycardia ([place])     a. PMH it lists "atrial flutter versus ventricular tachycardia  treated with amiodarone" - details of this are unclear. Notes from back to 2006 indicate she has been maintained on amiodarone but do not describe further indication. Patient's sister reports pt was told she had skipped beats that could cause her to drop dead - Dr. Lattie Haw put her on amiodarone and recommended a defibrillator.  Marland Kitchen AICD (automatic cardioverter/defibrillator) present   . Diabetes mellitus, type II ([place])     No insulin  . CHF (congestive heart failure) ([place])   . Arthritis     "fingers" (11/18/2014)   Past Surgical History  Procedure Laterality Date  . Colonoscopy  2008  . Bi-ventricular implantable cardioverter defibrillator  (crt-d)  11/18/2014  . Tonsillectomy    . Hernia repair Right   . Abdominal hysterectomy  2006  . Ep implantable device N/A 11/18/2014    Procedure: BiV ICD Insertion CRT-D;  Surgeon: Evans Lance, MD;  Location: [facility];  Service: Cardiovascular;  Laterality: N/A;   Family History  Problem Relation Age of Onset  . Diabetes Mother   . Kidney disease Mother   . Diabetes Sister   . Heart attack Brother    Social History  Substance Use Topics  . Smoking status: Never Smoker   . Smokeless tobacco: Never Used  . Alcohol Use: No   OB History    Gravida Para Term Preterm AB TAB SAB Ectopic Multiple Living            2     Review of Systems  Constitutional: Negative for fever  and chills.  HENT: Negative for congestion.   Eyes: Negative.  Negative for visual disturbance.  Respiratory: Positive for shortness of breath. Negative for cough.   Cardiovascular: Negative for chest pain and leg swelling.  Gastrointestinal: Negative for nausea, vomiting, abdominal pain and diarrhea.  Genitourinary: Negative for dysuria.  Musculoskeletal: Positive for back pain.  Skin: Negative for rash.  Neurological: Negative for headaches.  Hematological: Negative.  Does not bruise/bleed easily.  Psychiatric/Behavioral: Negative for confusion.  All other  systems reviewed and are negative.     Allergies  Codeine and Decongestant  Home Medications   Prior to Admission medications   Medication Sig Start Date End Date Taking? Authorizing Provider  acetaminophen (TYLENOL) 500 MG tablet Take 500 mg by mouth every 6 (six) hours as needed.   Yes Historical Provider, MD  aspirin 81 MG tablet Take 81 mg by mouth daily.   Yes Historical Provider, MD  atorvastatin (LIPITOR) 20 MG tablet Take 1 tablet (20 mg total) by mouth daily at 6 PM. 07/24/14  Yes Sinda Du, MD  calcium gluconate 500 MG tablet Take 500 mg by mouth 2 (two) times daily.     Yes Historical Provider, MD  carvedilol (COREG) 3.125 MG tablet TAKE ONE TABLET TWICE DAILY 05/15/15  Yes Evans Lance, MD  fish oil-omega-3 fatty acids 1000 MG capsule Take 1 capsule by mouth 3 (three) times daily.     Yes Historical Provider, MD  furosemide (LASIX) 40 MG tablet Take 20-40 mg by mouth 2 (two) times daily after a meal. Takes 40 mg = 1 tablet at breakfast and 20 mg = 1/2 tablet with supper   Yes Historical Provider, MD  insulin detemir (LEVEMIR) 100 UNIT/ML injection Inject 12 Units into the skin at bedtime.   Yes Sinda Du, MD  levothyroxine (SYNTHROID, LEVOTHROID) 75 MCG tablet Take 75 mcg by mouth daily before breakfast.   Yes Historical Provider, MD  linagliptin (TRADJENTA) 5 MG TABS tablet Take 5 mg by mouth daily.   Yes Historical Provider, MD  losartan (COZAAR) 100 MG tablet Take 100 mg by mouth daily.   Yes Historical Provider, MD  omeprazole (PRILOSEC) 20 MG capsule Take 20 mg by mouth 2 (two) times daily.     Yes Historical Provider, MD  potassium chloride (KLOR-CON) 20 MEQ packet Take 20 mEq by mouth 2 (two) times daily.     Yes Historical Provider, MD  spironolactone (ALDACTONE) 25 MG tablet Take 25 mg by mouth daily.     Yes Historical Provider, MD   BP 133/62 mmHg  Pulse 64  Temp(Src) 98 F (36.7 C) (Oral)  Resp 14  SpO2 99% Physical Exam  Constitutional: She is  oriented to person, place, and time. She appears well-developed and well-nourished.  HENT:  Head: Normocephalic and atraumatic.  Mucous membranes moist   Eyes: Pupils are equal, round, and reactive to light.  Sclera clear Eyes track normal  Cardiovascular: Normal rate and regular rhythm.   Pulmonary/Chest: Effort normal.  Bilateral rales  Abdominal: Soft. Bowel sounds are normal. She exhibits no distension. There is no tenderness.  Neurological: She is alert and oriented to person, place, and time. No cranial nerve deficit. She exhibits normal muscle tone. Coordination normal.  Skin: Skin is warm and dry.  Psychiatric: She has a normal mood and affect.  Nursing note and vitals reviewed.   ED Course  Procedures (including critical care time) DIAGNOSTIC STUDIES: Oxygen Saturation is 96% on 2L [place], adequate by my interpretation.  COORDINATION OF CARE: 9:55 AM Discussed treatment plan with pt at bedside and pt agreed to plan.   Labs Review Labs Reviewed  COMPREHENSIVE METABOLIC PANEL - Abnormal; Notable for the following:    Chloride 98 (*)    Glucose, Bld 131 (*)    BUN 25 (*)    All other components within normal limits  BRAIN NATRIURETIC PEPTIDE - Abnormal; Notable for the following:    B Natriuretic Peptide 684.0 (*)    All other components within normal limits  TROPONIN I - Abnormal; Notable for the following:    Troponin I 0.05 (*)    All other components within normal limits  TROPONIN I - Abnormal; Notable for the following:    Troponin I 0.05 (*)    All other components within normal limits  CBC WITH DIFFERENTIAL/PLATELET   Results for orders placed or performed during the hospital encounter of 05/16/15  CBC with Differential  Result Value Ref Range   WBC 9.0 4.0 - 10.5 K/uL   RBC 4.05 3.87 - 5.11 MIL/uL   Hemoglobin 13.2 12.0 - 15.0 g/dL   HCT 40.2 36.0 - 46.0 %   MCV 99.3 78.0 - 100.0 fL   MCH 32.6 26.0 - 34.0 pg   MCHC 32.8 30.0 - 36.0 g/dL   RDW 13.5  11.5 - 15.5 %   Platelets 205 150 - 400 K/uL   Neutrophils Relative % 73 %   Neutro Abs 6.6 1.7 - 7.7 K/uL   Lymphocytes Relative 20 %   Lymphs Abs 1.8 0.7 - 4.0 K/uL   Monocytes Relative 6 %   Monocytes Absolute 0.5 0.1 - 1.0 K/uL   Eosinophils Relative 1 %   Eosinophils Absolute 0.1 0.0 - 0.7 K/uL   Basophils Relative 0 %   Basophils Absolute 0.0 0.0 - 0.1 K/uL  Comprehensive metabolic panel  Result Value Ref Range   Sodium 138 135 - 145 mmol/L   Potassium 4.4 3.5 - 5.1 mmol/L   Chloride 98 (L) 101 - 111 mmol/L   CO2 29 22 - 32 mmol/L   Glucose, Bld 131 (H) 65 - 99 mg/dL   BUN 25 (H) 6 - 20 mg/dL   Creatinine, Ser 0.67 0.44 - 1.00 mg/dL   Calcium 9.6 8.9 - 10.3 mg/dL   Total Protein 7.6 6.5 - 8.1 g/dL   Albumin 4.5 3.5 - 5.0 g/dL   AST 22 15 - 41 U/L   ALT 21 14 - 54 U/L   Alkaline Phosphatase 64 38 - 126 U/L   Total Bilirubin 0.8 0.3 - 1.2 mg/dL   GFR calc non Af Amer >60 >60 mL/min   GFR calc Af Amer >60 >60 mL/min   Anion gap 11 5 - 15  Brain natriuretic peptide  Result Value Ref Range   B Natriuretic Peptide 684.0 (H) 0.0 - 100.0 pg/mL  Troponin I  Result Value Ref Range   Troponin I 0.05 (H) <0.031 ng/mL  Troponin I  Result Value Ref Range   Troponin I 0.05 (H) <0.031 ng/mL     Imaging Review Dg Chest 2 View  05/16/2015  CLINICAL DATA:  Shortness of breath, weakness, wheezing EXAM: CHEST  2 VIEW COMPARISON:  11/19/2014 FINDINGS: Left subclavian pacemaker noted. Moderate cardiomegaly with new developing mid and lower lung interstitial opacities with peripheral Kerley B-lines compatible with developing edema. Minor associated basilar atelectasis. No significant effusion. Negative for pneumothorax. Atherosclerosis of the aorta. Trachea remains midline. IMPRESSION: Cardiomegaly with early developing interstitial edema and  basilar atelectasis. Aortic atherosclerosis Electronically Signed   By: Jerilynn Mages.  Shick M.D.   On: 05/16/2015 10:17   Ct Chest Wo Contrast  05/16/2015   CLINICAL DATA:  Go to the emergency department with acute onset of progressively worsening shortness of breath which began yesterday. Current history of nonischemic cardiomyopathy with an indwelling biventricular pacemaker. EXAM: CT CHEST WITHOUT CONTRAST TECHNIQUE: Multidetector CT imaging of the chest was performed following the standard protocol without IV contrast. COMPARISON:  No prior CT. Chest x-rays earlier same day 0944 hr and previously. FINDINGS: Mediastinum/Lymph Nodes: No pathologically enlarged mediastinal, hilar or axillary lymph nodes. No mediastinal masses. Normal-appearing esophagus. Cardiovascular: Severe cardiomegaly with severe left ventricular enlargement. Left subclavian biventricular pacemaker with the lead tips at the right atrial appendage, RV apex, and left coronary vein. Severe LAD coronary atherosclerosis and mild right coronary atherosclerosis. Small pericardial effusion inferiorly. Moderate to severe atherosclerosis involving the thoracic and upper abdominal aorta and their visualized branches without evidence of aneurysm. Lungs/Pleura: Interstitial pulmonary edema and ground-glass airspace pulmonary edema diffusely throughout both lungs. Moderately large right pleural effusion with associated mild passive atelectasis in the right lower lobe. Small left pleural effusion. Calcified tracheobronchial cartilages. Eventration of esophagus into the posterior trachea approximately 5-6 cm below the larynx at the level of the upper sternoclavicular joints (series 2, image 15). Upper abdomen: No acute findings in the upper abdomen. Musculoskeletal: Mild degenerative disc disease and spondylosis involving the upper and mid thoracic spine. IMPRESSION: 1. Moderate CHF, with diffuse interstitial and ground-glass airspace pulmonary edema throughout both lungs. 2. Moderate size right pleural effusion and small left pleural effusion. Associated mild passive atelectasis in the right lower lobe. 3.  Small pericardial effusion. 4. Marked cardiomegaly with massive left ventricular enlargement. Biventricular pacemaker lead tips appropriately positioned. 5. Possible tracheomalacia involving the upper thoracic trachea as there is eventration of the esophagus into the posterior trachea approximately 5-6 cm below the larynx. Electronically Signed   By: Evangeline Dakin M.D.   On: 05/16/2015 11:42   I have personally reviewed and evaluated these images and lab results as part of my medical decision-making.   EKG Interpretation   Date/Time:  Saturday May 16 2015 09:30:35 EST Ventricular Rate:  78 PR Interval:  74 QRS Duration: 131 QT Interval:  446 QTC Calculation: 508 R Axis:   -60 Text Interpretation:  Ventricular-paced rhythm No further analysis  attempted due to paced rhythm Confirmed by Stanly Si  MD, Mirka Barbone ([id number]) on  05/16/2015 9:39:22 AM      MDM   Final diagnoses:  Acute on chronic systolic congestive heart failure ([place])    Patient with a history of congestive heart failure. Came in with trouble breathing. Requiring 2 L of oxygen room air sats were 88%. Chest x-ray raises some question of interstitial edema however CT of the chest confirmed moderate congestive heart failure. Also a moderate size right pleural effusion and small left pleural effusion. Patient's troponins 2 are slightly elevated that his norm for her there is no trend upward. EKG is a paced rhythm patient has defibrillator. Patient received IV Lasix 80 mg with some improvement in her breathing. All require admission. Patient is followed by Dr. Luan Pulling. Patient's BMP is elevated usually in the realm of when she gets admitted for her congestive heart failure.  The patient's blood pressure showed signs of improvement. She came in moderately elevated on the systolics [id number] but currently now she is 133/62. No acute intervention on the blood pressure required.  I personally  performed the services described in this  documentation, which was scribed in my presence. The recorded information has been reviewed and is accurate.       Donna Sorrow, MD 05/16/15 1442  Donna Sorrow, MD 05/16/15 [id number]

## 2015-05-16 NOTE — H&P (Signed)
[facility] History and Physical  Donna [name] [id number] DOB: [birth year] DOA: 05/16/2015  Referring physician: ER PCP: Alonza Bogus, MD   Chief Complaint: Dyspnea  HPI: Donna [name] is a 74 y.o. female  This is a very pleasant 74 year old lady who has ejection fraction of 20-25% and is known to have chronic systolic congestive heart failure with biventricular ICD implant now presents with dyspnea for the last 10-12 hours. She has had congestion and associated rhinorrhea but no significant productive cough. There is no fever or chills. She denies any chest pain. Evaluation in the emergency room is consistent with congestive heart failure. She is now being admitted for further management.   Review of Systems:  Apart from symptoms above, all systems are negative.  Past Medical History  Diagnosis Date  . Cardiomyopathy, nonischemic ([place])     Nl cors in [place]; CHF in 12/97; EF of 40% in 5/01 and 7/03, 25% in 9/04 and 4/08.  systolic murmur without valvular abnormalities by echo; refused automatic implantable cardiac defibrillator  . Left bundle branch block   . Hypertension   . Hyperlipidemia   . Cerebrovascular disease     Duplex study in 12/2006 shows atherosclerosis without focal stenosis  . Atrial flutter (HCC)     Versus ventricular tachycardia; treated with amiodarone  . Gastroesophageal reflux disease   . Ventricular tachycardia ([place])     a. PMH it lists "atrial flutter versus ventricular tachycardia treated with amiodarone" - details of this are unclear. Notes from back to 2006 indicate she has been maintained on amiodarone but do not describe further indication. Patient's sister reports pt was told she had skipped beats that could cause her to drop dead - Dr. Lattie Haw put her on amiodarone and recommended a defibrillator.  Marland Kitchen AICD (automatic cardioverter/defibrillator) present   . Diabetes mellitus, type II ([place])     No insulin  . CHF (congestive  heart failure) ([place])   . Arthritis     "fingers" (11/18/2014)   Past Surgical History  Procedure Laterality Date  . Colonoscopy  2008  . Bi-ventricular implantable cardioverter defibrillator  (crt-d)  11/18/2014  . Tonsillectomy    . Hernia repair Right   . Abdominal hysterectomy  2006  . Ep implantable device N/A 11/18/2014    Procedure: BiV ICD Insertion CRT-D;  Surgeon: Evans Lance, MD;  Location: [facility];  Service: Cardiovascular;  Laterality: N/A;   Social History:  reports that she has never smoked. She has never used smokeless tobacco. She reports that she does not drink alcohol or use illicit drugs.  Allergies  Allergen Reactions  . Codeine Other (See Comments)    Reaction:  Racing heart   . Decongestant [Pseudoephedrine Hcl Er] Other (See Comments)    Reaction:  Racing heart     Family History  Problem Relation Age of Onset  . Diabetes Mother   . Kidney disease Mother   . Diabetes Sister   . Heart attack Brother     Prior to Admission medications   Medication Sig Start Date End Date Taking? Authorizing Provider  acetaminophen (TYLENOL) 500 MG tablet Take 500 mg by mouth every 6 (six) hours as needed.   Yes Historical Provider, MD  aspirin 81 MG tablet Take 81 mg by mouth daily.   Yes Historical Provider, MD  atorvastatin (LIPITOR) 20 MG tablet Take 1 tablet (20 mg total) by mouth daily at 6 PM. 07/24/14  Yes Sinda Du, MD  calcium  gluconate 500 MG tablet Take 500 mg by mouth 2 (two) times daily.     Yes Historical Provider, MD  carvedilol (COREG) 3.125 MG tablet TAKE ONE TABLET TWICE DAILY 05/15/15  Yes Evans Lance, MD  fish oil-omega-3 fatty acids 1000 MG capsule Take 1 capsule by mouth 3 (three) times daily.     Yes Historical Provider, MD  furosemide (LASIX) 40 MG tablet Take 20-40 mg by mouth 2 (two) times daily after a meal. Takes 40 mg = 1 tablet at breakfast and 20 mg = 1/2 tablet with supper   Yes Historical Provider, MD  insulin detemir  (LEVEMIR) 100 UNIT/ML injection Inject 12 Units into the skin at bedtime.   Yes Sinda Du, MD  levothyroxine (SYNTHROID, LEVOTHROID) 75 MCG tablet Take 75 mcg by mouth daily before breakfast.   Yes Historical Provider, MD  linagliptin (TRADJENTA) 5 MG TABS tablet Take 5 mg by mouth daily.   Yes Historical Provider, MD  losartan (COZAAR) 100 MG tablet Take 100 mg by mouth daily.   Yes Historical Provider, MD  omeprazole (PRILOSEC) 20 MG capsule Take 20 mg by mouth 2 (two) times daily.     Yes Historical Provider, MD  potassium chloride (KLOR-CON) 20 MEQ packet Take 20 mEq by mouth 2 (two) times daily.     Yes Historical Provider, MD  spironolactone (ALDACTONE) 25 MG tablet Take 25 mg by mouth daily.     Yes Historical Provider, MD   Physical Exam: Filed Vitals:   05/16/15 1500 05/16/15 1530 05/16/15 1537 05/16/15 1620  BP: 121/61 125/62 125/62 128/63  Pulse: 64 66 66 71  Temp:   97.8 F (36.6 C) 98.4 F (36.9 C)  TempSrc:   Oral Oral  Resp: 16 14 14    Height:    5\' 1"  (1.549 m)  Weight:    53.434 kg (117 lb 12.8 oz)  SpO2: 97% 98% 97% 98%    Wt Readings from Last 3 Encounters:  05/16/15 53.434 kg (117 lb 12.8 oz)  02/19/15 51.483 kg (113 lb 8 oz)  01/08/15 49.442 kg (109 lb)    General:  Appears calm and comfortable Eyes: PERRL, normal lids, irises & conjunctiva ENT: grossly normal hearing, lips & tongue Neck: no LAD, masses or thyromegaly Cardiovascular: RRR, no m/r/g. She does have some ankle area pitting edema, minimal. Telemetry: SR, no arrhythmias  Respiratory: Few inspiratory bilateral crackles at the bases. Abdomen: soft, ntnd Skin: no rash or induration seen on limited exam Musculoskeletal: grossly normal tone BUE/BLE Psychiatric: grossly normal mood and affect, speech fluent and appropriate Neurologic: grossly non-focal.          Labs on Admission:  Basic Metabolic Panel:  Recent Labs Lab 05/16/15 0930  NA 138  K 4.4  CL 98*  CO2 29  GLUCOSE 131*    BUN 25*  CREATININE 0.67  CALCIUM 9.6   Liver Function Tests:  Recent Labs Lab 05/16/15 0930  AST 22  ALT 21  ALKPHOS 64  BILITOT 0.8  PROT 7.6  ALBUMIN 4.5   No results for input(s): LIPASE, AMYLASE in the last 168 hours. No results for input(s): AMMONIA in the last 168 hours. CBC:  Recent Labs Lab 05/16/15 0930  WBC 9.0  NEUTROABS 6.6  HGB 13.2  HCT 40.2  MCV 99.3  PLT 205   Cardiac Enzymes:  Recent Labs Lab 05/16/15 0936 05/16/15 1326  TROPONINI 0.05* 0.05*    BNP (last 3 results)  Recent Labs  07/22/14 0230 10/18/14  0305 05/16/15 0946  BNP 626.0* 672.0* 684.0*    ProBNP (last 3 results)  Recent Labs  05/19/14 0304  PROBNP 1012.0*    CBG: No results for input(s): GLUCAP in the last 168 hours.  Radiological Exams on Admission: Dg Chest 2 View  05/16/2015  CLINICAL DATA:  Shortness of breath, weakness, wheezing EXAM: CHEST  2 VIEW COMPARISON:  11/19/2014 FINDINGS: Left subclavian pacemaker noted. Moderate cardiomegaly with new developing mid and lower lung interstitial opacities with peripheral Kerley B-lines compatible with developing edema. Minor associated basilar atelectasis. No significant effusion. Negative for pneumothorax. Atherosclerosis of the aorta. Trachea remains midline. IMPRESSION: Cardiomegaly with early developing interstitial edema and basilar atelectasis. Aortic atherosclerosis Electronically Signed   By: Jerilynn Mages.  Shick M.D.   On: 05/16/2015 10:17   Ct Chest Wo Contrast  05/16/2015  CLINICAL DATA:  Go to the emergency department with acute onset of progressively worsening shortness of breath which began yesterday. Current history of nonischemic cardiomyopathy with an indwelling biventricular pacemaker. EXAM: CT CHEST WITHOUT CONTRAST TECHNIQUE: Multidetector CT imaging of the chest was performed following the standard protocol without IV contrast. COMPARISON:  No prior CT. Chest x-rays earlier same day 0944 hr and previously.  FINDINGS: Mediastinum/Lymph Nodes: No pathologically enlarged mediastinal, hilar or axillary lymph nodes. No mediastinal masses. Normal-appearing esophagus. Cardiovascular: Severe cardiomegaly with severe left ventricular enlargement. Left subclavian biventricular pacemaker with the lead tips at the right atrial appendage, RV apex, and left coronary vein. Severe LAD coronary atherosclerosis and mild right coronary atherosclerosis. Small pericardial effusion inferiorly. Moderate to severe atherosclerosis involving the thoracic and upper abdominal aorta and their visualized branches without evidence of aneurysm. Lungs/Pleura: Interstitial pulmonary edema and ground-glass airspace pulmonary edema diffusely throughout both lungs. Moderately large right pleural effusion with associated mild passive atelectasis in the right lower lobe. Small left pleural effusion. Calcified tracheobronchial cartilages. Eventration of esophagus into the posterior trachea approximately 5-6 cm below the larynx at the level of the upper sternoclavicular joints (series 2, image 15). Upper abdomen: No acute findings in the upper abdomen. Musculoskeletal: Mild degenerative disc disease and spondylosis involving the upper and mid thoracic spine. IMPRESSION: 1. Moderate CHF, with diffuse interstitial and ground-glass airspace pulmonary edema throughout both lungs. 2. Moderate size right pleural effusion and small left pleural effusion. Associated mild passive atelectasis in the right lower lobe. 3. Small pericardial effusion. 4. Marked cardiomegaly with massive left ventricular enlargement. Biventricular pacemaker lead tips appropriately positioned. 5. Possible tracheomalacia involving the upper thoracic trachea as there is eventration of the esophagus into the posterior trachea approximately 5-6 cm below the larynx. Electronically Signed   By: Evangeline Dakin M.D.   On: 05/16/2015 11:42    EKG: Independently reviewed. This shows a ventricular  paced rhythm  Assessment/Plan   1. Acute on chronic systolic congestive heart failure. She will be treated with intravenous Lasix. Continue with other home medications. She may need cardiology consultation if she does not improve. 2. Diabetes. Continue with home medications and sliding scale of insulin. 3. Hypertension. Stable. Continue home medications. 4. Elevated troponin. This is stable and I do not believe there is an acute coronary syndrome here but we will cycle these enzymes.  She'll be admitted to telemetry. Further recommendations will depend on patient's hospital progress.   Code Status: Full code.   DVT Prophylaxis: Lovenox  Family Communication: I discussed the plan with the patient the bedside  Disposition Plan: Home when medically stable.   Time spent: 60 minutes.  Chical C  [facility] Pager [phone].

## 2015-05-16 NOTE — ED Notes (Signed)
Room air sats down to 88%,  Placed on 2 liters oxygen and sats up to 91%.  Increased oxygen to 3 liters and sats up to 96%.

## 2015-05-17 LAB — CBC
HCT: 35.2 % — ABNORMAL LOW (ref 36.0–46.0)
Hemoglobin: 11.5 g/dL — ABNORMAL LOW (ref 12.0–15.0)
MCH: 31.9 pg (ref 26.0–34.0)
MCHC: 32.7 g/dL (ref 30.0–36.0)
MCV: 97.5 fL (ref 78.0–100.0)
Platelets: 181 10*3/uL (ref 150–400)
RBC: 3.61 MIL/uL — ABNORMAL LOW (ref 3.87–5.11)
RDW: 13.5 % (ref 11.5–15.5)
WBC: 6.7 10*3/uL (ref 4.0–10.5)

## 2015-05-17 LAB — COMPREHENSIVE METABOLIC PANEL
ALBUMIN: 3.7 g/dL (ref 3.5–5.0)
ALK PHOS: 51 U/L (ref 38–126)
ALT: 16 U/L (ref 14–54)
AST: 18 U/L (ref 15–41)
Anion gap: 8 (ref 5–15)
BUN: 30 mg/dL — AB (ref 6–20)
CALCIUM: 8.4 mg/dL — AB (ref 8.9–10.3)
CHLORIDE: 101 mmol/L (ref 101–111)
CO2: 29 mmol/L (ref 22–32)
CREATININE: 0.74 mg/dL (ref 0.44–1.00)
GFR calc non Af Amer: >60 mL/min (ref >60)
GLUCOSE: 108 mg/dL — AB (ref 65–99)
Potassium: 3.1 mmol/L — ABNORMAL LOW (ref 3.5–5.1)
SODIUM: 138 mmol/L (ref 135–145)
Total Bilirubin: 0.7 mg/dL (ref 0.3–1.2)
Total Protein: 6.4 g/dL — ABNORMAL LOW (ref 6.5–8.1)

## 2015-05-17 LAB — GLUCOSE, CAPILLARY
GLUCOSE-CAPILLARY: 130 mg/dL — AB (ref 65–99)
GLUCOSE-CAPILLARY: 154 mg/dL — AB (ref 65–99)
Glucose-Capillary: 109 mg/dL — ABNORMAL HIGH (ref 65–99)
Glucose-Capillary: 159 mg/dL — ABNORMAL HIGH (ref 65–99)

## 2015-05-17 LAB — TROPONIN I: TROPONIN I: 0.04 ng/mL — AB (ref <0.031)

## 2015-05-17 MED ORDER — CALCIUM CARBONATE 1250 (500 CA) MG PO TABS
1.0000 | ORAL_TABLET | Freq: Two times a day (BID) | ORAL | Status: DC
  Administered 2015-05-17 (×2): 500 mg via ORAL
  Filled 2015-05-17 (×2): qty 1

## 2015-05-17 NOTE — Progress Notes (Signed)
Subjective: She was admitted with acute on chronic systolic heart failure. Chest x-ray showed early pulmonary edema. She has not had any changes in treatment. She says her weight has not changed.  Objective: Vital signs in last 24 hours: Temp:  [97.8 F (36.6 C)-98.5 F (36.9 C)] 98 F (36.7 C) (12/11 0529) Pulse Rate:  [63-87] 65 (12/11 0529) Resp:  [13-25] 16 (12/10 2136) BP: (93-153)/(46-87) 112/53 mmHg (12/11 0529) SpO2:  [90 %-100 %] 96 % (12/11 0529) Weight:  [53.388 kg (117 lb 11.2 oz)-53.434 kg (117 lb 12.8 oz)] 53.388 kg (117 lb 11.2 oz) (12/10 2254) Weight change:  Last BM Date: 05/16/15  Intake/Output from previous day: 12/10 0701 - 12/11 0700 In: -  Out: 1450 [Urine:1450]  PHYSICAL EXAM General appearance: alert, cooperative and mild distress Resp: rales bilaterally Cardio: regular rate and rhythm, S1, S2 normal, no murmur, click, rub or gallop GI: soft, non-tender; bowel sounds normal; no masses,  no organomegaly Extremities: extremities normal, atraumatic, no cyanosis or edema  Lab Results:  Results for orders placed or performed during the hospital encounter of 05/16/15 (from the past 48 hour(s))  CBC with Differential     Status: None   Collection Time: 05/16/15  9:30 AM  Result Value Ref Range   WBC 9.0 4.0 - 10.5 K/uL   RBC 4.05 3.87 - 5.11 MIL/uL   Hemoglobin 13.2 12.0 - 15.0 g/dL   HCT 40.2 36.0 - 46.0 %   MCV 99.3 78.0 - 100.0 fL   MCH 32.6 26.0 - 34.0 pg   MCHC 32.8 30.0 - 36.0 g/dL   RDW 13.5 11.5 - 15.5 %   Platelets 205 150 - 400 K/uL   Neutrophils Relative % 73 %   Neutro Abs 6.6 1.7 - 7.7 K/uL   Lymphocytes Relative 20 %   Lymphs Abs 1.8 0.7 - 4.0 K/uL   Monocytes Relative 6 %   Monocytes Absolute 0.5 0.1 - 1.0 K/uL   Eosinophils Relative 1 %   Eosinophils Absolute 0.1 0.0 - 0.7 K/uL   Basophils Relative 0 %   Basophils Absolute 0.0 0.0 - 0.1 K/uL  Comprehensive metabolic panel     Status: Abnormal   Collection Time: 05/16/15  9:30 AM   Result Value Ref Range   Sodium 138 135 - 145 mmol/L   Potassium 4.4 3.5 - 5.1 mmol/L   Chloride 98 (L) 101 - 111 mmol/L   CO2 29 22 - 32 mmol/L   Glucose, Bld 131 (H) 65 - 99 mg/dL   BUN 25 (H) 6 - 20 mg/dL   Creatinine, Ser 0.67 0.44 - 1.00 mg/dL   Calcium 9.6 8.9 - 10.3 mg/dL   Total Protein 7.6 6.5 - 8.1 g/dL   Albumin 4.5 3.5 - 5.0 g/dL   AST 22 15 - 41 U/L   ALT 21 14 - 54 U/L   Alkaline Phosphatase 64 38 - 126 U/L   Total Bilirubin 0.8 0.3 - 1.2 mg/dL   GFR calc non Af Amer >60 >60 mL/min   GFR calc Af Amer >60 >60 mL/min    Comment: (NOTE) The eGFR has been calculated using the CKD EPI equation. This calculation has not been validated in all clinical situations. eGFR's persistently <60 mL/min signify possible Chronic Kidney Disease.    Anion gap 11 5 - 15  Troponin I     Status: Abnormal   Collection Time: 05/16/15  9:36 AM  Result Value Ref Range   Troponin I 0.05 (H) <0.031  ng/mL    Comment:        PERSISTENTLY INCREASED TROPONIN VALUES IN THE RANGE OF 0.04-0.49 ng/mL CAN BE SEEN IN:       -UNSTABLE ANGINA       -CONGESTIVE HEART FAILURE       -MYOCARDITIS       -CHEST TRAUMA       -ARRYHTHMIAS       -LATE PRESENTING MYOCARDIAL INFARCTION       -COPD   CLINICAL FOLLOW-UP RECOMMENDED.   Brain natriuretic peptide     Status: Abnormal   Collection Time: 05/16/15  9:46 AM  Result Value Ref Range   B Natriuretic Peptide 684.0 (H) 0.0 - 100.0 pg/mL  Troponin I     Status: Abnormal   Collection Time: 05/16/15  1:26 PM  Result Value Ref Range   Troponin I 0.05 (H) <0.031 ng/mL    Comment:        PERSISTENTLY INCREASED TROPONIN VALUES IN THE RANGE OF 0.04-0.49 ng/mL CAN BE SEEN IN:       -UNSTABLE ANGINA       -CONGESTIVE HEART FAILURE       -MYOCARDITIS       -CHEST TRAUMA       -ARRYHTHMIAS       -LATE PRESENTING MYOCARDIAL INFARCTION       -COPD   CLINICAL FOLLOW-UP RECOMMENDED.   Glucose, capillary     Status: Abnormal   Collection Time: 05/16/15   4:53 PM  Result Value Ref Range   Glucose-Capillary 112 (H) 65 - 99 mg/dL  Troponin I     Status: Abnormal   Collection Time: 05/16/15  5:14 PM  Result Value Ref Range   Troponin I 0.05 (H) <0.031 ng/mL    Comment:        PERSISTENTLY INCREASED TROPONIN VALUES IN THE RANGE OF 0.04-0.49 ng/mL CAN BE SEEN IN:       -UNSTABLE ANGINA       -CONGESTIVE HEART FAILURE       -MYOCARDITIS       -CHEST TRAUMA       -ARRYHTHMIAS       -LATE PRESENTING MYOCARDIAL INFARCTION       -COPD   CLINICAL FOLLOW-UP RECOMMENDED.   Troponin I     Status: Abnormal   Collection Time: 05/16/15 10:43 PM  Result Value Ref Range   Troponin I 0.04 (H) <0.031 ng/mL    Comment:        PERSISTENTLY INCREASED TROPONIN VALUES IN THE RANGE OF 0.04-0.49 ng/mL CAN BE SEEN IN:       -UNSTABLE ANGINA       -CONGESTIVE HEART FAILURE       -MYOCARDITIS       -CHEST TRAUMA       -ARRYHTHMIAS       -LATE PRESENTING MYOCARDIAL INFARCTION       -COPD   CLINICAL FOLLOW-UP RECOMMENDED.   Glucose, capillary     Status: Abnormal   Collection Time: 05/16/15 11:24 PM  Result Value Ref Range   Glucose-Capillary 159 (H) 65 - 99 mg/dL   Comment 1 Notify RN    Comment 2 Document in Chart   Troponin I     Status: Abnormal   Collection Time: 05/17/15  4:33 AM  Result Value Ref Range   Troponin I 0.04 (H) <0.031 ng/mL    Comment:        PERSISTENTLY INCREASED TROPONIN VALUES IN THE RANGE OF 0.04-0.49 ng/mL  CAN BE SEEN IN:       -UNSTABLE ANGINA       -CONGESTIVE HEART FAILURE       -MYOCARDITIS       -CHEST TRAUMA       -ARRYHTHMIAS       -LATE PRESENTING MYOCARDIAL INFARCTION       -COPD   CLINICAL FOLLOW-UP RECOMMENDED.   Comprehensive metabolic panel     Status: Abnormal   Collection Time: 05/17/15  4:33 AM  Result Value Ref Range   Sodium 138 135 - 145 mmol/L   Potassium 3.1 (L) 3.5 - 5.1 mmol/L    Comment: DELTA CHECK NOTED   Chloride 101 101 - 111 mmol/L   CO2 29 22 - 32 mmol/L   Glucose, Bld 108 (H)  65 - 99 mg/dL   BUN 30 (H) 6 - 20 mg/dL   Creatinine, Ser 0.74 0.44 - 1.00 mg/dL   Calcium 8.4 (L) 8.9 - 10.3 mg/dL   Total Protein 6.4 (L) 6.5 - 8.1 g/dL   Albumin 3.7 3.5 - 5.0 g/dL   AST 18 15 - 41 U/L   ALT 16 14 - 54 U/L   Alkaline Phosphatase 51 38 - 126 U/L   Total Bilirubin 0.7 0.3 - 1.2 mg/dL   GFR calc non Af Amer >60 >60 mL/min   GFR calc Af Amer >60 >60 mL/min    Comment: (NOTE) The eGFR has been calculated using the CKD EPI equation. This calculation has not been validated in all clinical situations. eGFR's persistently <60 mL/min signify possible Chronic Kidney Disease.    Anion gap 8 5 - 15  CBC     Status: Abnormal   Collection Time: 05/17/15  4:33 AM  Result Value Ref Range   WBC 6.7 4.0 - 10.5 K/uL   RBC 3.61 (L) 3.87 - 5.11 MIL/uL   Hemoglobin 11.5 (L) 12.0 - 15.0 g/dL   HCT 35.2 (L) 36.0 - 46.0 %   MCV 97.5 78.0 - 100.0 fL   MCH 31.9 26.0 - 34.0 pg   MCHC 32.7 30.0 - 36.0 g/dL   RDW 13.5 11.5 - 15.5 %   Platelets 181 150 - 400 K/uL  Glucose, capillary     Status: Abnormal   Collection Time: 05/17/15  8:06 AM  Result Value Ref Range   Glucose-Capillary 130 (H) 65 - 99 mg/dL    ABGS No results for input(s): PHART, PO2ART, TCO2, HCO3 in the last 72 hours.  Invalid input(s): PCO2 CULTURES No results found for this or any previous visit (from the past 240 hour(s)). Studies/Results: Dg Chest 2 View  05/16/2015  CLINICAL DATA:  Shortness of breath, weakness, wheezing EXAM: CHEST  2 VIEW COMPARISON:  11/19/2014 FINDINGS: Left subclavian pacemaker noted. Moderate cardiomegaly with new developing mid and lower lung interstitial opacities with peripheral Kerley B-lines compatible with developing edema. Minor associated basilar atelectasis. No significant effusion. Negative for pneumothorax. Atherosclerosis of the aorta. Trachea remains midline. IMPRESSION: Cardiomegaly with early developing interstitial edema and basilar atelectasis. Aortic atherosclerosis  Electronically Signed   By: Jerilynn Mages.  Shick M.D.   On: 05/16/2015 10:17   Ct Chest Wo Contrast  05/16/2015  CLINICAL DATA:  Go to the emergency department with acute onset of progressively worsening shortness of breath which began yesterday. Current history of nonischemic cardiomyopathy with an indwelling biventricular pacemaker. EXAM: CT CHEST WITHOUT CONTRAST TECHNIQUE: Multidetector CT imaging of the chest was performed following the standard protocol without IV contrast. COMPARISON:  No prior  CT. Chest x-rays earlier same day 0944 hr and previously. FINDINGS: Mediastinum/Lymph Nodes: No pathologically enlarged mediastinal, hilar or axillary lymph nodes. No mediastinal masses. Normal-appearing esophagus. Cardiovascular: Severe cardiomegaly with severe left ventricular enlargement. Left subclavian biventricular pacemaker with the lead tips at the right atrial appendage, RV apex, and left coronary vein. Severe LAD coronary atherosclerosis and mild right coronary atherosclerosis. Small pericardial effusion inferiorly. Moderate to severe atherosclerosis involving the thoracic and upper abdominal aorta and their visualized branches without evidence of aneurysm. Lungs/Pleura: Interstitial pulmonary edema and ground-glass airspace pulmonary edema diffusely throughout both lungs. Moderately large right pleural effusion with associated mild passive atelectasis in the right lower lobe. Small left pleural effusion. Calcified tracheobronchial cartilages. Eventration of esophagus into the posterior trachea approximately 5-6 cm below the larynx at the level of the upper sternoclavicular joints (series 2, image 15). Upper abdomen: No acute findings in the upper abdomen. Musculoskeletal: Mild degenerative disc disease and spondylosis involving the upper and mid thoracic spine. IMPRESSION: 1. Moderate CHF, with diffuse interstitial and ground-glass airspace pulmonary edema throughout both lungs. 2. Moderate size right pleural  effusion and small left pleural effusion. Associated mild passive atelectasis in the right lower lobe. 3. Small pericardial effusion. 4. Marked cardiomegaly with massive left ventricular enlargement. Biventricular pacemaker lead tips appropriately positioned. 5. Possible tracheomalacia involving the upper thoracic trachea as there is eventration of the esophagus into the posterior trachea approximately 5-6 cm below the larynx. Electronically Signed   By: Evangeline Dakin M.D.   On: 05/16/2015 11:42    Medications:  Prior to Admission:  Prescriptions prior to admission  Medication Sig Dispense Refill Last Dose  . acetaminophen (TYLENOL) 500 MG tablet Take 500 mg by mouth every 6 (six) hours as needed.   05/15/2015 at Unknown time  . aspirin 81 MG tablet Take 81 mg by mouth daily.   05/15/2015 at Unknown time  . atorvastatin (LIPITOR) 20 MG tablet Take 1 tablet (20 mg total) by mouth daily at 6 PM. 30 tablet 12 05/15/2015 at Unknown time  . calcium gluconate 500 MG tablet Take 500 mg by mouth 2 (two) times daily.     05/15/2015 at Unknown time  . carvedilol (COREG) 3.125 MG tablet TAKE ONE TABLET TWICE DAILY 60 tablet 11 05/15/2015 at 1700  . fish oil-omega-3 fatty acids 1000 MG capsule Take 1 capsule by mouth 3 (three) times daily.     05/15/2015 at Unknown time  . furosemide (LASIX) 40 MG tablet Take 20-40 mg by mouth 2 (two) times daily after a meal. Takes 40 mg = 1 tablet at breakfast and 20 mg = 1/2 tablet with supper   05/15/2015 at Unknown time  . insulin detemir (LEVEMIR) 100 UNIT/ML injection Inject 12 Units into the skin at bedtime.   05/15/2015 at Unknown time  . levothyroxine (SYNTHROID, LEVOTHROID) 75 MCG tablet Take 75 mcg by mouth daily before breakfast.   05/15/2015 at Unknown time  . linagliptin (TRADJENTA) 5 MG TABS tablet Take 5 mg by mouth daily.   05/15/2015 at Unknown time  . losartan (COZAAR) 100 MG tablet Take 100 mg by mouth daily.   05/15/2015 at Unknown time  . omeprazole (PRILOSEC)  20 MG capsule Take 20 mg by mouth 2 (two) times daily.     05/15/2015 at Unknown time  . potassium chloride (KLOR-CON) 20 MEQ packet Take 20 mEq by mouth 2 (two) times daily.     05/15/2015 at Unknown time  . spironolactone (ALDACTONE) 25 MG tablet Take  25 mg by mouth daily.     05/15/2015 at Unknown time   Scheduled: . aspirin EC  81 mg Oral Daily  . atorvastatin  20 mg Oral q1800  . calcium carbonate  1 tablet Oral BID WC  . carvedilol  3.125 mg Oral BID  . enoxaparin (LOVENOX) injection  40 mg Subcutaneous Q24H  . furosemide  40 mg Intravenous BID  . insulin aspart  0-15 Units Subcutaneous TID WC  . insulin aspart  0-5 Units Subcutaneous QHS  . insulin detemir  12 Units Subcutaneous QHS  . levothyroxine  75 mcg Oral QAC breakfast  . linagliptin  5 mg Oral Daily  . losartan  100 mg Oral Daily  . omega-3 acid ethyl esters  1 g Oral BID  . pantoprazole  40 mg Oral Daily  . potassium chloride  20 mEq Oral BID  . sodium chloride  3 mL Intravenous Q12H  . spironolactone  25 mg Oral Daily   Continuous:  [id number], ondansetron **OR** ondansetron (ZOFRAN) IV  Assesment: She was admitted with acute on chronic systolic heart failure. I think she is better. Her potassium is low. I don't think she is quite ready for discharge. Active Problems:   Hypertension   Diabetes mellitus, type II (H[facility])   Acute on chronic systolic congestive heart failure ([facility])   Acute on chronic systolic (congestive) heart failure ([place])    Plan: Continue current treatments. Replace her potassium. Probable discharge in the morning    LOS: 1 day   Rayni Nemitz L 05/17/2015, 10:30 AM

## 2015-05-18 DIAGNOSIS — E876 Hypokalemia: Secondary | ICD-10-CM | POA: Diagnosis present

## 2015-05-18 LAB — BASIC METABOLIC PANEL
Anion gap: 6 (ref 5–15)
BUN: 27 mg/dL — AB (ref 6–20)
CALCIUM: 9.1 mg/dL (ref 8.9–10.3)
CHLORIDE: 101 mmol/L (ref 101–111)
CO2: 30 mmol/L (ref 22–32)
CREATININE: 0.68 mg/dL (ref 0.44–1.00)
GFR calc Af Amer: >60 mL/min (ref >60)
Glucose, Bld: 147 mg/dL — ABNORMAL HIGH (ref 65–99)
POTASSIUM: 3.6 mmol/L (ref 3.5–5.1)
SODIUM: 137 mmol/L (ref 135–145)

## 2015-05-18 LAB — GLUCOSE, CAPILLARY: Glucose-Capillary: 130 mg/dL — ABNORMAL HIGH (ref 65–99)

## 2015-05-18 MED ORDER — FUROSEMIDE 40 MG PO TABS: 40.0000 mg | ORAL_TABLET | Freq: Two times a day (BID) | ORAL | Status: DC

## 2015-05-18 NOTE — Discharge Summary (Signed)
Physician Discharge Summary  Patient ID: Donna [name] MRN: [id number] DOB/AGE: [birth year] 75 y.o. Primary Care Physician:Jalynn Waddell L, MD Admit date: 05/16/2015 Discharge date: 05/18/2015    Discharge Diagnoses:   Active Problems:   Left bundle branch block   Hypertension   Diabetes mellitus, type II ([place])   NICM (nonischemic cardiomyopathy) ([place])   Acute on chronic systolic congestive heart failure (HCC)   Acute on chronic systolic (congestive) heart failure (HCC)   Hypokalemia     Medication List    TAKE these medications        acetaminophen 500 MG tablet  Commonly known as:  TYLENOL  Take 500 mg by mouth every 6 (six) hours as needed.     aspirin 81 MG tablet  Take 81 mg by mouth daily.     atorvastatin 20 MG tablet  Commonly known as:  LIPITOR  Take 1 tablet (20 mg total) by mouth daily at 6 PM.     calcium gluconate 500 MG tablet  Take 500 mg by mouth 2 (two) times daily.     carvedilol 3.125 MG tablet  Commonly known as:  COREG  TAKE ONE TABLET TWICE DAILY     fish oil-omega-3 fatty acids 1000 MG capsule  Take 1 capsule by mouth 3 (three) times daily.     furosemide 40 MG tablet  Commonly known as:  LASIX  Take 1 tablet (40 mg total) by mouth 2 (two) times daily. Takes 40 mg = 1 tablet at breakfast and 20 mg = 1/2 tablet with supper     insulin detemir 100 UNIT/ML injection  Commonly known as:  LEVEMIR  Inject 12 Units into the skin at bedtime.     levothyroxine 75 MCG tablet  Commonly known as:  SYNTHROID, LEVOTHROID  Take 75 mcg by mouth daily before breakfast.     linagliptin 5 MG Tabs tablet  Commonly known as:  TRADJENTA  Take 5 mg by mouth daily.     losartan 100 MG tablet  Commonly known as:  COZAAR  Take 100 mg by mouth daily.     omeprazole 20 MG capsule  Commonly known as:  PRILOSEC  Take 20 mg by mouth 2 (two) times daily.     potassium chloride 20 MEQ packet  Commonly known as:  KLOR-CON  Take 20 mEq by mouth 2 (two)  times daily.     spironolactone 25 MG tablet  Commonly known as:  ALDACTONE  Take 25 mg by mouth daily.        Discharged Condition: Improved    Consults: None  Significant Diagnostic Studies: Dg Chest 2 View  05/16/2015  CLINICAL DATA:  Shortness of breath, weakness, wheezing EXAM: CHEST  2 VIEW COMPARISON:  11/19/2014 FINDINGS: Left subclavian pacemaker noted. Moderate cardiomegaly with new developing mid and lower lung interstitial opacities with peripheral Kerley B-lines compatible with developing edema. Minor associated basilar atelectasis. No significant effusion. Negative for pneumothorax. Atherosclerosis of the aorta. Trachea remains midline. IMPRESSION: Cardiomegaly with early developing interstitial edema and basilar atelectasis. Aortic atherosclerosis Electronically Signed   By: Jerilynn Mages.  Shick M.D.   On: 05/16/2015 10:17   Ct Chest Wo Contrast  05/16/2015  CLINICAL DATA:  Go to the emergency department with acute onset of progressively worsening shortness of breath which began yesterday. Current history of nonischemic cardiomyopathy with an indwelling biventricular pacemaker. EXAM: CT CHEST WITHOUT CONTRAST TECHNIQUE: Multidetector CT imaging of the chest was performed following the standard protocol without IV contrast. COMPARISON:  No  prior CT. Chest x-rays earlier same day 0944 hr and previously. FINDINGS: Mediastinum/Lymph Nodes: No pathologically enlarged mediastinal, hilar or axillary lymph nodes. No mediastinal masses. Normal-appearing esophagus. Cardiovascular: Severe cardiomegaly with severe left ventricular enlargement. Left subclavian biventricular pacemaker with the lead tips at the right atrial appendage, RV apex, and left coronary vein. Severe LAD coronary atherosclerosis and mild right coronary atherosclerosis. Small pericardial effusion inferiorly. Moderate to severe atherosclerosis involving the thoracic and upper abdominal aorta and their visualized branches without  evidence of aneurysm. Lungs/Pleura: Interstitial pulmonary edema and ground-glass airspace pulmonary edema diffusely throughout both lungs. Moderately large right pleural effusion with associated mild passive atelectasis in the right lower lobe. Small left pleural effusion. Calcified tracheobronchial cartilages. Eventration of esophagus into the posterior trachea approximately 5-6 cm below the larynx at the level of the upper sternoclavicular joints (series 2, image 15). Upper abdomen: No acute findings in the upper abdomen. Musculoskeletal: Mild degenerative disc disease and spondylosis involving the upper and mid thoracic spine. IMPRESSION: 1. Moderate CHF, with diffuse interstitial and ground-glass airspace pulmonary edema throughout both lungs. 2. Moderate size right pleural effusion and small left pleural effusion. Associated mild passive atelectasis in the right lower lobe. 3. Small pericardial effusion. 4. Marked cardiomegaly with massive left ventricular enlargement. Biventricular pacemaker lead tips appropriately positioned. 5. Possible tracheomalacia involving the upper thoracic trachea as there is eventration of the esophagus into the posterior trachea approximately 5-6 cm below the larynx. Electronically Signed   By: Evangeline Dakin M.D.   On: 05/16/2015 11:42    Lab Results: Basic Metabolic Panel:  Recent Labs  05/17/15 0433 05/18/15 0601  NA 138 137  K 3.1* 3.6  CL 101 101  CO2 29 30  GLUCOSE 108* 147*  BUN 30* 27*  CREATININE 0.74 0.68  CALCIUM 8.4* 9.1   Liver Function Tests:  Recent Labs  05/16/15 0930 05/17/15 0433  AST 22 18  ALT 21 16  ALKPHOS 64 51  BILITOT 0.8 0.7  PROT 7.6 6.4*  ALBUMIN 4.5 3.7     CBC:  Recent Labs  05/16/15 0930 05/17/15 0433  WBC 9.0 6.7  NEUTROABS 6.6  --   HGB 13.2 11.5*  HCT 40.2 35.2*  MCV 99.3 97.5  PLT 205 181    No results found for this or any previous visit (from the past 240 hour(s)).   Hospital Course: She was  admitted with shortness of breath. Chest x-ray showed what looked like early pulmonary edema. She was treated with intravenous diuresis. She did not notice a change in her weight and she has not changed her diet at home. She was hypokalemic and this was replaced. After about 48 hours in the hospital she was back at baseline able to lie flat to sleep with no shortness of breath and she was discharged home  Discharge Exam: Blood pressure 118/59, pulse 63, temperature 97.8 F (36.6 C), temperature source Oral, resp. rate 20, height 5\' 1"  (1.549 m), weight 53.116 kg (117 lb 1.6 oz), SpO2 95 %. She is awake and alert. Her chest is clear. Her heart is regular without gallop.  Disposition: Home she does not want home health services. She will have early follow-up with cardiology. I did increase her Lasix to 40 mg twice a day from 40 mg in the morning and 20 in the evening      Discharge Instructions    Discharge patient    Complete by:  As directed  Follow-up Information    Follow up with [name], NP On 05/25/2015.   Specialties:  Nurse Practitioner, Radiology, Cardiology   Why:  3:10   Contact information:   [place] [address] [phone]       Signed: Jadi Deyarmin [name]   05/18/2015, 8:26 AM

## 2015-05-18 NOTE — Progress Notes (Signed)
Subjective: She feels better. She has no new complaints. She is listed as 1.2 [name] negative since admission but I don't think we are collecting all of her urine. She was able to lie flat to sleep last night.  Objective: Vital signs in last 24 hours: Temp:  [97.5 F (36.4 C)-98.3 F (36.8 C)] 97.8 F (36.6 C) (12/12 0520) Pulse Rate:  [61-65] 63 (12/12 0520) Resp:  [16-20] 20 (12/12 0520) BP: (116-122)/(59-68) 118/59 mmHg (12/12 0520) SpO2:  [95 %-99 %] 95 % (12/12 0520) Weight:  [53.116 kg (117 lb 1.6 oz)] 53.116 kg (117 lb 1.6 oz) (12/12 0255) Weight change: -0.318 kg (-11.2 oz) Last BM Date: 05/16/15  Intake/Output from previous day: 12/11 0701 - 12/12 0700 In: 243 [P.O.:240; I.V.:3] Out: -   PHYSICAL EXAM General appearance: alert, cooperative and no distress Resp: clear to auscultation bilaterally Cardio: Her heart is regular with no gallop GI: soft, non-tender; bowel sounds normal; no masses,  no organomegaly Extremities: extremities normal, atraumatic, no cyanosis or edema  Lab Results:  Results for orders placed or performed during the hospital encounter of 05/16/15 (from the past 48 hour(s))  CBC with Differential     Status: None   Collection Time: 05/16/15  9:30 AM  Result Value Ref Range   WBC 9.0 4.0 - 10.5 K/uL   RBC 4.05 3.87 - 5.11 MIL/uL   Hemoglobin 13.2 12.0 - 15.0 g/dL   HCT 40.2 36.0 - 46.0 %   MCV 99.3 78.0 - 100.0 fL   MCH 32.6 26.0 - 34.0 pg   MCHC 32.8 30.0 - 36.0 g/dL   RDW 13.5 11.5 - 15.5 %   Platelets 205 150 - 400 K/uL   Neutrophils Relative % 73 %   Neutro Abs 6.6 1.7 - 7.7 K/uL   Lymphocytes Relative 20 %   Lymphs Abs 1.8 0.7 - 4.0 K/uL   Monocytes Relative 6 %   Monocytes Absolute 0.5 0.1 - 1.0 K/uL   Eosinophils Relative 1 %   Eosinophils Absolute 0.1 0.0 - 0.7 K/uL   Basophils Relative 0 %   Basophils Absolute 0.0 0.0 - 0.1 K/uL  Comprehensive metabolic panel     Status: Abnormal   Collection Time: 05/16/15  9:30 AM  Result Value  Ref Range   Sodium 138 135 - 145 mmol/[name]   Potassium 4.4 3.5 - 5.1 mmol/[name]   Chloride 98 ([name]) 101 - 111 mmol/[name]   CO2 29 22 - 32 mmol/[name]   Glucose, Bld 131 (H) 65 - 99 mg/dL   BUN 25 (H) 6 - 20 mg/dL   Creatinine, Ser 0.67 0.44 - 1.00 mg/dL   Calcium 9.6 8.9 - 10.3 mg/dL   Total Protein 7.6 6.5 - 8.1 g/dL   Albumin 4.5 3.5 - 5.0 g/dL   AST 22 15 - 41 U/[name]   ALT 21 14 - 54 U/[name]   Alkaline Phosphatase 64 38 - 126 U/[name]   Total Bilirubin 0.8 0.3 - 1.2 mg/dL   GFR calc non Af Amer >60 >60 mL/min   GFR calc Af Amer >60 >60 mL/min    Comment: (NOTE) The eGFR has been calculated using the CKD EPI equation. This calculation has not been validated in all clinical situations. eGFR's persistently <60 mL/min signify possible Chronic Kidney Disease.    Anion gap 11 5 - 15  Troponin I     Status: Abnormal   Collection Time: 05/16/15  9:36 AM  Result Value Ref Range   Troponin I 0.05 (  H) <0.031 ng/mL    Comment:        PERSISTENTLY INCREASED TROPONIN VALUES IN THE RANGE OF 0.04-0.49 ng/mL CAN BE SEEN IN:       -UNSTABLE ANGINA       -CONGESTIVE HEART FAILURE       -MYOCARDITIS       -CHEST TRAUMA       -ARRYHTHMIAS       -LATE PRESENTING MYOCARDIAL INFARCTION       -COPD   CLINICAL FOLLOW-UP RECOMMENDED.   Brain natriuretic peptide     Status: Abnormal   Collection Time: 05/16/15  9:46 AM  Result Value Ref Range   B Natriuretic Peptide 684.0 (H) 0.0 - 100.0 pg/mL  Troponin I     Status: Abnormal   Collection Time: 05/16/15  1:26 PM  Result Value Ref Range   Troponin I 0.05 (H) <0.031 ng/mL    Comment:        PERSISTENTLY INCREASED TROPONIN VALUES IN THE RANGE OF 0.04-0.49 ng/mL CAN BE SEEN IN:       -UNSTABLE ANGINA       -CONGESTIVE HEART FAILURE       -MYOCARDITIS       -CHEST TRAUMA       -ARRYHTHMIAS       -LATE PRESENTING MYOCARDIAL INFARCTION       -COPD   CLINICAL FOLLOW-UP RECOMMENDED.   Glucose, capillary     Status: Abnormal   Collection Time: 05/16/15  4:53 PM   Result Value Ref Range   Glucose-Capillary 112 (H) 65 - 99 mg/dL  Troponin I     Status: Abnormal   Collection Time: 05/16/15  5:14 PM  Result Value Ref Range   Troponin I 0.05 (H) <0.031 ng/mL    Comment:        PERSISTENTLY INCREASED TROPONIN VALUES IN THE RANGE OF 0.04-0.49 ng/mL CAN BE SEEN IN:       -UNSTABLE ANGINA       -CONGESTIVE HEART FAILURE       -MYOCARDITIS       -CHEST TRAUMA       -ARRYHTHMIAS       -LATE PRESENTING MYOCARDIAL INFARCTION       -COPD   CLINICAL FOLLOW-UP RECOMMENDED.   Troponin I     Status: Abnormal   Collection Time: 05/16/15 10:43 PM  Result Value Ref Range   Troponin I 0.04 (H) <0.031 ng/mL    Comment:        PERSISTENTLY INCREASED TROPONIN VALUES IN THE RANGE OF 0.04-0.49 ng/mL CAN BE SEEN IN:       -UNSTABLE ANGINA       -CONGESTIVE HEART FAILURE       -MYOCARDITIS       -CHEST TRAUMA       -ARRYHTHMIAS       -LATE PRESENTING MYOCARDIAL INFARCTION       -COPD   CLINICAL FOLLOW-UP RECOMMENDED.   Glucose, capillary     Status: Abnormal   Collection Time: 05/16/15 11:24 PM  Result Value Ref Range   Glucose-Capillary 159 (H) 65 - 99 mg/dL   Comment 1 Notify RN    Comment 2 Document in Chart   Troponin I     Status: Abnormal   Collection Time: 05/17/15  4:33 AM  Result Value Ref Range   Troponin I 0.04 (H) <0.031 ng/mL    Comment:        PERSISTENTLY INCREASED TROPONIN VALUES IN THE RANGE OF  0.04-0.49 ng/mL CAN BE SEEN IN:       -UNSTABLE ANGINA       -CONGESTIVE HEART FAILURE       -MYOCARDITIS       -CHEST TRAUMA       -ARRYHTHMIAS       -LATE PRESENTING MYOCARDIAL INFARCTION       -COPD   CLINICAL FOLLOW-UP RECOMMENDED.   Comprehensive metabolic panel     Status: Abnormal   Collection Time: 05/17/15  4:33 AM  Result Value Ref Range   Sodium 138 135 - 145 mmol/[name]   Potassium 3.1 ([name]) 3.5 - 5.1 mmol/[name]    Comment: DELTA CHECK NOTED   Chloride 101 101 - 111 mmol/[name]   CO2 29 22 - 32 mmol/[name]   Glucose, Bld 108 (H) 65 - 99  mg/dL   BUN 30 (H) 6 - 20 mg/dL   Creatinine, Ser 0.74 0.44 - 1.00 mg/dL   Calcium 8.4 ([name]) 8.9 - 10.3 mg/dL   Total Protein 6.4 ([name]) 6.5 - 8.1 g/dL   Albumin 3.7 3.5 - 5.0 g/dL   AST 18 15 - 41 U/[name]   ALT 16 14 - 54 U/[name]   Alkaline Phosphatase 51 38 - 126 U/[name]   Total Bilirubin 0.7 0.3 - 1.2 mg/dL   GFR calc non Af Amer >60 >60 mL/min   GFR calc Af Amer >60 >60 mL/min    Comment: (NOTE) The eGFR has been calculated using the CKD EPI equation. This calculation has not been validated in all clinical situations. eGFR's persistently <60 mL/min signify possible Chronic Kidney Disease.    Anion gap 8 5 - 15  CBC     Status: Abnormal   Collection Time: 05/17/15  4:33 AM  Result Value Ref Range   WBC 6.7 4.0 - 10.5 K/uL   RBC 3.61 ([name]) 3.87 - 5.11 MIL/uL   Hemoglobin 11.5 ([name]) 12.0 - 15.0 g/dL   HCT 35.2 ([name]) 36.0 - 46.0 %   MCV 97.5 78.0 - 100.0 fL   MCH 31.9 26.0 - 34.0 pg   MCHC 32.7 30.0 - 36.0 g/dL   RDW 13.5 11.5 - 15.5 %   Platelets 181 150 - 400 K/uL  Glucose, capillary     Status: Abnormal   Collection Time: 05/17/15  8:06 AM  Result Value Ref Range   Glucose-Capillary 130 (H) 65 - 99 mg/dL  Glucose, capillary     Status: Abnormal   Collection Time: 05/17/15 12:08 PM  Result Value Ref Range   Glucose-Capillary 154 (H) 65 - 99 mg/dL  Glucose, capillary     Status: Abnormal   Collection Time: 05/17/15  4:52 PM  Result Value Ref Range   Glucose-Capillary 109 (H) 65 - 99 mg/dL  Glucose, capillary     Status: Abnormal   Collection Time: 05/17/15  8:29 PM  Result Value Ref Range   Glucose-Capillary 159 (H) 65 - 99 mg/dL   Comment 1 Notify RN    Comment 2 Document in Chart   Basic metabolic panel     Status: Abnormal   Collection Time: 05/18/15  6:01 AM  Result Value Ref Range   Sodium 137 135 - 145 mmol/[name]   Potassium 3.6 3.5 - 5.1 mmol/[name]   Chloride 101 101 - 111 mmol/[name]   CO2 30 22 - 32 mmol/[name]   Glucose, Bld 147 (H) 65 - 99 mg/dL   BUN 27 (H) 6 - 20 mg/dL   Creatinine,  Ser 0.68 0.44 - 1.00  mg/dL   Calcium 9.1 8.9 - 10.3 mg/dL   GFR calc non Af Amer >60 >60 mL/min   GFR calc Af Amer >60 >60 mL/min    Comment: (NOTE) The eGFR has been calculated using the CKD EPI equation. This calculation has not been validated in all clinical situations. eGFR's persistently <60 mL/min signify possible Chronic Kidney Disease.    Anion gap 6 5 - 15  Glucose, capillary     Status: Abnormal   Collection Time: 05/18/15  8:04 AM  Result Value Ref Range   Glucose-Capillary 130 (H) 65 - 99 mg/dL    ABGS No results for input(s): PHART, PO2ART, TCO2, HCO3 in the last 72 hours.  Invalid input(s): PCO2 CULTURES No results found for this or any previous visit (from the past 240 hour(s)). Studies/Results: Dg Chest 2 View  05/16/2015  CLINICAL DATA:  Shortness of breath, weakness, wheezing EXAM: CHEST  2 VIEW COMPARISON:  11/19/2014 FINDINGS: Left subclavian pacemaker noted. Moderate cardiomegaly with new developing mid and lower lung interstitial opacities with peripheral Kerley B-lines compatible with developing edema. Minor associated basilar atelectasis. No significant effusion. Negative for pneumothorax. Atherosclerosis of the aorta. Trachea remains midline. IMPRESSION: Cardiomegaly with early developing interstitial edema and basilar atelectasis. Aortic atherosclerosis Electronically Signed   By: Jerilynn Mages.  Shick M.D.   On: 05/16/2015 10:17   Ct Chest Wo Contrast  05/16/2015  CLINICAL DATA:  Go to the emergency department with acute onset of progressively worsening shortness of breath which began yesterday. Current history of nonischemic cardiomyopathy with an indwelling biventricular pacemaker. EXAM: CT CHEST WITHOUT CONTRAST TECHNIQUE: Multidetector CT imaging of the chest was performed following the standard protocol without IV contrast. COMPARISON:  No prior CT. Chest x-rays earlier same day 0944 hr and previously. FINDINGS: Mediastinum/Lymph Nodes: No pathologically enlarged  mediastinal, hilar or axillary lymph nodes. No mediastinal masses. Normal-appearing esophagus. Cardiovascular: Severe cardiomegaly with severe left ventricular enlargement. Left subclavian biventricular pacemaker with the lead tips at the right atrial appendage, RV apex, and left coronary vein. Severe LAD coronary atherosclerosis and mild right coronary atherosclerosis. Small pericardial effusion inferiorly. Moderate to severe atherosclerosis involving the thoracic and upper abdominal aorta and their visualized branches without evidence of aneurysm. Lungs/Pleura: Interstitial pulmonary edema and ground-glass airspace pulmonary edema diffusely throughout both lungs. Moderately large right pleural effusion with associated mild passive atelectasis in the right lower lobe. Small left pleural effusion. Calcified tracheobronchial cartilages. Eventration of esophagus into the posterior trachea approximately 5-6 cm below the larynx at the level of the upper sternoclavicular joints (series 2, image 15). Upper abdomen: No acute findings in the upper abdomen. Musculoskeletal: Mild degenerative disc disease and spondylosis involving the upper and mid thoracic spine. IMPRESSION: 1. Moderate CHF, with diffuse interstitial and ground-glass airspace pulmonary edema throughout both lungs. 2. Moderate size right pleural effusion and small left pleural effusion. Associated mild passive atelectasis in the right lower lobe. 3. Small pericardial effusion. 4. Marked cardiomegaly with massive left ventricular enlargement. Biventricular pacemaker lead tips appropriately positioned. 5. Possible tracheomalacia involving the upper thoracic trachea as there is eventration of the esophagus into the posterior trachea approximately 5-6 cm below the larynx. Electronically Signed   By: Evangeline Dakin M.D.   On: 05/16/2015 11:42    Medications:  Prior to Admission:  Prescriptions prior to admission  Medication Sig Dispense Refill Last Dose   . acetaminophen (TYLENOL) 500 MG tablet Take 500 mg by mouth every 6 (six) hours as needed.   05/15/2015 at Unknown time  .  aspirin 81 MG tablet Take 81 mg by mouth daily.   05/15/2015 at Unknown time  . atorvastatin (LIPITOR) 20 MG tablet Take 1 tablet (20 mg total) by mouth daily at 6 PM. 30 tablet 12 05/15/2015 at Unknown time  . calcium gluconate 500 MG tablet Take 500 mg by mouth 2 (two) times daily.     05/15/2015 at Unknown time  . carvedilol (COREG) 3.125 MG tablet TAKE ONE TABLET TWICE DAILY 60 tablet 11 05/15/2015 at 1700  . fish oil-omega-3 fatty acids 1000 MG capsule Take 1 capsule by mouth 3 (three) times daily.     05/15/2015 at Unknown time  . insulin detemir (LEVEMIR) 100 UNIT/ML injection Inject 12 Units into the skin at bedtime.   05/15/2015 at Unknown time  . levothyroxine (SYNTHROID, LEVOTHROID) 75 MCG tablet Take 75 mcg by mouth daily before breakfast.   05/15/2015 at Unknown time  . linagliptin (TRADJENTA) 5 MG TABS tablet Take 5 mg by mouth daily.   05/15/2015 at Unknown time  . losartan (COZAAR) 100 MG tablet Take 100 mg by mouth daily.   05/15/2015 at Unknown time  . omeprazole (PRILOSEC) 20 MG capsule Take 20 mg by mouth 2 (two) times daily.     05/15/2015 at Unknown time  . potassium chloride (KLOR-CON) 20 MEQ packet Take 20 mEq by mouth 2 (two) times daily.     05/15/2015 at Unknown time  . spironolactone (ALDACTONE) 25 MG tablet Take 25 mg by mouth daily.     05/15/2015 at Unknown time  . [DISCONTINUED] furosemide (LASIX) 40 MG tablet Take 20-40 mg by mouth 2 (two) times daily after a meal. Takes 40 mg = 1 tablet at breakfast and 20 mg = 1/2 tablet with supper   05/15/2015 at Unknown time   Scheduled: . aspirin EC  81 mg Oral Daily  . atorvastatin  20 mg Oral q1800  . calcium carbonate  1 tablet Oral BID WC  . carvedilol  3.125 mg Oral BID  . enoxaparin (LOVENOX) injection  40 mg Subcutaneous Q24H  . furosemide  40 mg Intravenous BID  . insulin aspart  0-15 Units Subcutaneous  TID WC  . insulin aspart  0-5 Units Subcutaneous QHS  . insulin detemir  12 Units Subcutaneous QHS  . levothyroxine  75 mcg Oral QAC breakfast  . linagliptin  5 mg Oral Daily  . losartan  100 mg Oral Daily  . omega-3 acid ethyl esters  1 g Oral BID  . pantoprazole  40 mg Oral Daily  . potassium chloride  20 mEq Oral BID  . sodium chloride  3 mL Intravenous Q12H  . spironolactone  25 mg Oral Daily   Continuous:  TOI:[id number], ondansetron **OR** ondansetron (ZOFRAN) IV  Assesment: She was admitted with acute on chronic systolic heart failure. This has improved. She has diabetes at baseline which is stable. She is ready for discharge Active Problems:   Hypertension   Diabetes mellitus, type II ([place])   Acute on chronic systolic congestive heart failure ([facility])   Acute on chronic systolic (congestive) heart failure ([place])    Plan: Discharge home    LOS: 2 days   Donna [name] 05/18/2015, 8:24 AM

## 2015-05-18 NOTE — Progress Notes (Signed)
Patient states understanding of discharge instructions.  

## 2015-05-21 ENCOUNTER — Ambulatory Visit (INDEPENDENT_AMBULATORY_CARE_PROVIDER_SITE_OTHER): Payer: Medicare Other | Admitting: *Deleted

## 2015-05-21 DIAGNOSIS — I429 Cardiomyopathy, unspecified: Secondary | ICD-10-CM

## 2015-05-21 DIAGNOSIS — I5023 Acute on chronic systolic (congestive) heart failure: Secondary | ICD-10-CM

## 2015-05-21 DIAGNOSIS — I5022 Chronic systolic (congestive) heart failure: Secondary | ICD-10-CM | POA: Diagnosis not present

## 2015-05-21 DIAGNOSIS — I428 Other cardiomyopathies: Secondary | ICD-10-CM

## 2015-05-21 NOTE — Progress Notes (Signed)
Remote ICD transmission.   

## 2015-05-25 ENCOUNTER — Encounter: Payer: Self-pay | Admitting: Adult Health

## 2015-05-25 ENCOUNTER — Ambulatory Visit (INDEPENDENT_AMBULATORY_CARE_PROVIDER_SITE_OTHER): Payer: Medicare Other | Admitting: Adult Health

## 2015-05-25 ENCOUNTER — Other Ambulatory Visit (HOSPITAL_COMMUNITY)
Admission: RE | Admit: 2015-05-25 | Discharge: 2015-05-25 | Disposition: A | Discharge status: Home / Self Care | Payer: Medicare Other | Source: Ambulatory Visit | Attending: Adult Health | Admitting: Adult Health

## 2015-05-25 ENCOUNTER — Telehealth: Payer: Self-pay | Admitting: *Deleted

## 2015-05-25 VITALS — BP 142/86 | HR 67 | Ht 61.0 in | Wt 118.0 lb

## 2015-05-25 DIAGNOSIS — I10 Essential (primary) hypertension: Secondary | ICD-10-CM

## 2015-05-25 LAB — BASIC METABOLIC PANEL
Anion gap: 7 (ref 5–15)
BUN: 25 mg/dL — AB (ref 6–20)
CHLORIDE: 99 mmol/L — AB (ref 101–111)
CO2: 30 mmol/L (ref 22–32)
Calcium: 9 mg/dL (ref 8.9–10.3)
Creatinine, Ser: 0.86 mg/dL (ref 0.44–1.00)
GFR calc Af Amer: >60 mL/min (ref >60)
Glucose, Bld: 140 mg/dL — ABNORMAL HIGH (ref 65–99)
POTASSIUM: 3.6 mmol/L (ref 3.5–5.1)
SODIUM: 136 mmol/L (ref 135–145)

## 2015-05-25 LAB — CUP PACEART REMOTE DEVICE CHECK
Battery Remaining Longevity: 79 mo
Battery Remaining Percentage: 91 %
Brady Statistic AP VP Percent: 77 %
Brady Statistic AS VS Percent: 1 %
HIGH POWER IMPEDANCE MEASURED VALUE: 62 Ohm
HIGH POWER IMPEDANCE MEASURED VALUE: 62 Ohm
Implantable Lead Implant Date: 20160614
Implantable Lead Location: 753860
Implantable Lead Model: 7122
Lead Channel Impedance Value: 550 Ohm
Lead Channel Sensing Intrinsic Amplitude: 12 mV
Lead Channel Sensing Intrinsic Amplitude: 2.7 mV
Lead Channel Setting Pacing Amplitude: 2 V
Lead Channel Setting Pacing Amplitude: 2.5 V
Lead Channel Setting Pacing Pulse Width: 0.5 ms
Lead Channel Setting Pacing Pulse Width: 0.5 ms
MDC IDC LEAD IMPLANT DT: 20160614
MDC IDC LEAD IMPLANT DT: 20160614
MDC IDC LEAD LOCATION: 753858
MDC IDC LEAD LOCATION: 753859
MDC IDC MSMT BATTERY VOLTAGE: 3.14 V
MDC IDC MSMT LEADCHNL LV IMPEDANCE VALUE: 1050 Ohm
MDC IDC MSMT LEADCHNL RV IMPEDANCE VALUE: 540 Ohm
MDC IDC PG SERIAL: 1210376
MDC IDC SESS DTM: 20161215075633
MDC IDC SET LEADCHNL LV PACING AMPLITUDE: 2 V
MDC IDC SET LEADCHNL RV SENSING SENSITIVITY: 0.5 mV
MDC IDC STAT BRADY AP VS PERCENT: 2.6 %
MDC IDC STAT BRADY AS VP PERCENT: 20 %
MDC IDC STAT BRADY RA PERCENT PACED: 79 %

## 2015-05-25 LAB — MAGNESIUM: MAGNESIUM: 2.4 mg/dL (ref 1.7–2.4)

## 2015-05-25 NOTE — Patient Instructions (Signed)
Your physician recommends that you schedule a follow-up appointment in: 3 Months with Dr. Bronson Ing.  Your physician recommends that you continue on your current medications as directed. Please refer to the Current Medication list given to you today.  Your physician recommends that you return for lab work in: (BMET, Mg)  If you need a refill on your cardiac medications before your next appointment, please call your pharmacy.  Thank you for choosing [facility]!

## 2015-05-25 NOTE — Progress Notes (Signed)
Name: Donna [name]    DOB: [birth year]  Age: 74 y.o.  MR#: [id number]       PCP:  Alonza Bogus, MD      Insurance: Payor: [employer] MEDICARE / Plan: [facility] MEDICARE / Product Type: *No Product type* /   CC:   No chief complaint on file.   VS Filed Vitals:   05/25/15 1503  BP: 142/86  Pulse: 67  Height: 5\' 1"  (1.549 m)  Weight: 118 lb (53.524 kg)  SpO2: 99%    Weights Current Weight  05/25/15 118 lb (53.524 kg)  05/18/15 117 lb 1.6 oz (53.116 kg)  02/19/15 113 lb 8 oz (51.483 kg)    Blood Pressure  BP Readings from Last 3 Encounters:  05/25/15 142/86  05/18/15 118/59  02/19/15 116/62     Admit date:  (Not on file) Last encounter with RMR:  Visit date not found   Allergy Codeine and Decongestant  Current Outpatient Prescriptions  Medication Sig Dispense Refill  . acetaminophen (TYLENOL) 500 MG tablet Take 500 mg by mouth every 6 (six) hours as needed.    Marland Kitchen aspirin 81 MG tablet Take 81 mg by mouth daily.    Marland Kitchen atorvastatin (LIPITOR) 20 MG tablet Take 1 tablet (20 mg total) by mouth daily at 6 PM. 30 tablet 12  . calcium gluconate 500 MG tablet Take 500 mg by mouth 2 (two) times daily.      . carvedilol (COREG) 3.125 MG tablet TAKE ONE TABLET TWICE DAILY 60 tablet 11  . fish oil-omega-3 fatty acids 1000 MG capsule Take 1 capsule by mouth 3 (three) times daily.      . furosemide (LASIX) 40 MG tablet Take 1 tablet (40 mg total) by mouth 2 (two) times daily. Takes 40 mg = 1 tablet at breakfast and 20 mg = 1/2 tablet with supper 60 tablet 12  . insulin detemir (LEVEMIR) 100 UNIT/ML injection Inject 12 Units into the skin at bedtime.    Marland Kitchen levothyroxine (SYNTHROID, LEVOTHROID) 75 MCG tablet Take 75 mcg by mouth daily before breakfast.    . linagliptin (TRADJENTA) 5 MG TABS tablet Take 5 mg by mouth daily.    Marland Kitchen losartan (COZAAR) 100 MG tablet Take 100 mg by mouth daily.    Marland Kitchen omeprazole (PRILOSEC) 20 MG capsule Take 20 mg by mouth 2 (two) times daily.      . potassium  chloride (KLOR-CON) 20 MEQ packet Take 20 mEq by mouth 2 (two) times daily.      Marland Kitchen spironolactone (ALDACTONE) 25 MG tablet Take 25 mg by mouth daily.       No current facility-administered medications for this visit.    Discontinued Meds:   There are no discontinued medications.  Patient Active Problem List   Diagnosis Date Noted  . Hypokalemia 05/18/2015  . Acute on chronic systolic congestive heart failure ([place]) 05/16/2015  . Acute on chronic systolic (congestive) heart failure ([place]) 05/16/2015  . Chronic systolic heart failure ([place]) 11/18/2014  . NICM (nonischemic cardiomyopathy) ([place]) 11/18/2014  . Acute on chronic systolic heart failure ([place]) 10/20/2014  . Acute respiratory failure with hypoxia ([place]) 10/18/2014  . CHF exacerbation ([place]) 07/22/2014  . Acute exacerbation of CHF (congestive heart failure) ([place]) 07/22/2014  . Abnormality of gait 09/26/2013  . Weakness of left hip 09/26/2013  . Diabetes mellitus, type II ([place]) 06/15/2011  . Cardiomyopathy, nonischemic ([place])   . Hypertension   . Hypothyroidism 05/28/2009  . Left bundle branch block 05/28/2009  .  GASTROESOPHAGEAL REFLUX DISEASE 05/28/2009  . Hyperlipidemia 05/14/2008  . VENTRICULAR TACHYCARDIA 05/14/2008  . CEREBROVASCULAR DISEASE 05/14/2008    LABS    Component Value Date/Time   NA 137 05/18/2015 0601   NA 138 05/17/2015 0433   NA 138 05/16/2015 0930   K 3.6 05/18/2015 0601   K 3.1* 05/17/2015 0433   K 4.4 05/16/2015 0930   CL 101 05/18/2015 0601   CL 101 05/17/2015 0433   CL 98* 05/16/2015 0930   CO2 30 05/18/2015 0601   CO2 29 05/17/2015 0433   CO2 29 05/16/2015 0930   GLUCOSE 147* 05/18/2015 0601   GLUCOSE 108* 05/17/2015 0433   GLUCOSE 131* 05/16/2015 0930   BUN 27* 05/18/2015 0601   BUN 30* 05/17/2015 0433   BUN 25* 05/16/2015 0930   CREATININE 0.68 05/18/2015 0601   CREATININE 0.74 05/17/2015 0433   CREATININE 0.67 05/16/2015 0930   CREATININE 0.51 08/21/2014 1012   CREATININE 0.57  12/06/2010 0816   CALCIUM 9.1 05/18/2015 0601   CALCIUM 8.4* 05/17/2015 0433   CALCIUM 9.6 05/16/2015 0930   GFRNONAA >60 05/18/2015 0601   GFRNONAA >60 05/17/2015 0433   GFRNONAA >60 05/16/2015 0930   GFRAA >60 05/18/2015 0601   GFRAA >60 05/17/2015 0433   GFRAA >60 05/16/2015 0930   CMP     Component Value Date/Time   NA 137 05/18/2015 0601   K 3.6 05/18/2015 0601   CL 101 05/18/2015 0601   CO2 30 05/18/2015 0601   GLUCOSE 147* 05/18/2015 0601   BUN 27* 05/18/2015 0601   CREATININE 0.68 05/18/2015 0601   CREATININE 0.51 08/21/2014 1012   CALCIUM 9.1 05/18/2015 0601   PROT 6.4* 05/17/2015 0433   ALBUMIN 3.7 05/17/2015 0433   AST 18 05/17/2015 0433   ALT 16 05/17/2015 0433   ALKPHOS 51 05/17/2015 0433   BILITOT 0.7 05/17/2015 0433   GFRNONAA >60 05/18/2015 0601   GFRAA >60 05/18/2015 0601       Component Value Date/Time   WBC 6.7 05/17/2015 0433   WBC 9.0 05/16/2015 0930   WBC 5.9 11/11/2014 1134   HGB 11.5* 05/17/2015 0433   HGB 13.2 05/16/2015 0930   HGB 12.3 11/11/2014 1134   HCT 35.2* 05/17/2015 0433   HCT 40.2 05/16/2015 0930   HCT 37.1 11/11/2014 1134   MCV 97.5 05/17/2015 0433   MCV 99.3 05/16/2015 0930   MCV 96.3 11/11/2014 1134    Lipid Panel     Component Value Date/Time   CHOL 119 04/02/2009 0950   TRIG 172 04/02/2009 0950   HDL 34 04/02/2009 0950   CHOLHDL 5.1 05/09/2008 0332   VLDL 42* 05/09/2008 0332   LDLCALC 51 04/02/2009 0950    ABG    Component Value Date/Time   TCO2 30 05/08/2008 0742     Lab Results  Component Value Date   TSH 10.726* 10/18/2014   BNP (last 3 results)  Recent Labs  07/22/14 0230 10/18/14 0305 05/16/15 0946  BNP 626.0* 672.0* 684.0*    ProBNP (last 3 results) No results for input(s): PROBNP in the last 8760 hours.  Cardiac Panel (last 3 results) No results for input(s): CKTOTAL, CKMB, TROPONINI, RELINDX in the last 72 hours.  Iron/TIBC/Ferritin/ %Sat No results found for: IRON, TIBC, FERRITIN,  IRONPCTSAT   EKG Orders placed or performed during the hospital encounter of 05/16/15  . ED EKG  . ED EKG  . EKG 12-Lead  . EKG 12-Lead  . EKG     Prior Assessment  and Plan Problem List as of 05/25/2015      Cardiovascular and Mediastinum   Left bundle branch block   VENTRICULAR TACHYCARDIA   Last Assessment & Plan 02/19/2015 Office Visit Written 02/19/2015 10:42 AM by Evans Lance, MD    Since her ICD Implant, she has had no ventricular tachycardia. She will continue low dose amiodarone.      CEREBROVASCULAR DISEASE   Last Assessment & Plan 06/14/2012 Office Visit Written 06/15/2012 10:50 AM by Yehuda Savannah, MD    Asymptomatic carotid bruit with atherosclerosis but no focal stenosis on ultrasound study in 12/2006 No bruit heard in 2013-does not appear to be an active problem.        Cardiomyopathy, nonischemic [facility])   Last Assessment & Plan 06/14/2012 Office Visit Written 06/15/2012 10:49 AM by Yehuda Savannah, MD    The patient remains well compensated with respect to nonischemic cardiomyopathy.  EF has been somewhat variable in the past, but has been consistently below 30% for at least the past decade.  Despite this, Ms. Wulff's functional status is excellent.  Current therapy will be maintained.  Electrolytes and renal function will be monitored.      Hypertension   Last Assessment & Plan 02/19/2015 Office Visit Written 02/19/2015 10:43 AM by Evans Lance, MD    Her blood pressure is well controlled. She will continue her current meds.       CHF exacerbation (HCC)   Acute exacerbation of CHF (congestive heart failure) ([place])   Acute on chronic systolic heart failure [facility])   Last Assessment & Plan 02/19/2015 Office Visit Written 02/19/2015 10:42 AM by Evans Lance, MD    Her symptoms are class 2. She will continue her current meds, increase her physical activity and reduce her sodium intake.      Chronic systolic heart failure (HCC)   NICM (nonischemic  cardiomyopathy) (HCC)   Acute on chronic systolic congestive heart failure (HCC)   Acute on chronic systolic (congestive) heart failure (HCC)     Respiratory   Acute respiratory failure with hypoxia [facility])     Digestive   GASTROESOPHAGEAL REFLUX DISEASE     Endocrine   Hypothyroidism   Last Assessment & Plan 06/15/2011 Office Visit Edited 06/22/2011 12:12 PM by Yehuda Savannah, MD    Euthyroid clinically and chemically as assessed by a normal TSH 7 months ago, on 0.075 mg of levothyroxine per day      Diabetes mellitus, type II [facility])   Last Assessment & Plan 06/14/2012 Office Visit Edited 06/17/2012 12:45 PM by Yehuda Savannah, MD    Control of diabetes has not been a major problem.  Recent CBGs of around 160 as reported by the patient suggests that there is still some room for improvement.        Other   Hyperlipidemia   Last Assessment & Plan 06/14/2012 Office Visit Written 06/15/2012 10:52 AM by Yehuda Savannah, MD    Lipid profile has been excellent with current therapy, most recently in 03/2011.  Dr. Luan Pulling is managing and monitoring this issue.      Abnormality of gait   Weakness of left hip   Hypokalemia       Imaging: Dg Chest 2 View  05/16/2015  CLINICAL DATA:  Shortness of breath, weakness, wheezing EXAM: CHEST  2 VIEW COMPARISON:  11/19/2014 FINDINGS: Left subclavian pacemaker noted. Moderate cardiomegaly with new developing mid and lower lung interstitial opacities with peripheral Kerley B-lines compatible with  developing edema. Minor associated basilar atelectasis. No significant effusion. Negative for pneumothorax. Atherosclerosis of the aorta. Trachea remains midline. IMPRESSION: Cardiomegaly with early developing interstitial edema and basilar atelectasis. Aortic atherosclerosis Electronically Signed   By: Jerilynn Mages.  Shick M.D.   On: 05/16/2015 10:17   Ct Chest Wo Contrast  05/16/2015  CLINICAL DATA:  Go to the emergency department with acute onset of progressively  worsening shortness of breath which began yesterday. Current history of nonischemic cardiomyopathy with an indwelling biventricular pacemaker. EXAM: CT CHEST WITHOUT CONTRAST TECHNIQUE: Multidetector CT imaging of the chest was performed following the standard protocol without IV contrast. COMPARISON:  No prior CT. Chest x-rays earlier same day 0944 hr and previously. FINDINGS: Mediastinum/Lymph Nodes: No pathologically enlarged mediastinal, hilar or axillary lymph nodes. No mediastinal masses. Normal-appearing esophagus. Cardiovascular: Severe cardiomegaly with severe left ventricular enlargement. Left subclavian biventricular pacemaker with the lead tips at the right atrial appendage, RV apex, and left coronary vein. Severe LAD coronary atherosclerosis and mild right coronary atherosclerosis. Small pericardial effusion inferiorly. Moderate to severe atherosclerosis involving the thoracic and upper abdominal aorta and their visualized branches without evidence of aneurysm. Lungs/Pleura: Interstitial pulmonary edema and ground-glass airspace pulmonary edema diffusely throughout both lungs. Moderately large right pleural effusion with associated mild passive atelectasis in the right lower lobe. Small left pleural effusion. Calcified tracheobronchial cartilages. Eventration of esophagus into the posterior trachea approximately 5-6 cm below the larynx at the level of the upper sternoclavicular joints (series 2, image 15). Upper abdomen: No acute findings in the upper abdomen. Musculoskeletal: Mild degenerative disc disease and spondylosis involving the upper and mid thoracic spine. IMPRESSION: 1. Moderate CHF, with diffuse interstitial and ground-glass airspace pulmonary edema throughout both lungs. 2. Moderate size right pleural effusion and small left pleural effusion. Associated mild passive atelectasis in the right lower lobe. 3. Small pericardial effusion. 4. Marked cardiomegaly with massive left ventricular  enlargement. Biventricular pacemaker lead tips appropriately positioned. 5. Possible tracheomalacia involving the upper thoracic trachea as there is eventration of the esophagus into the posterior trachea approximately 5-6 cm below the larynx. Electronically Signed   By: Evangeline Dakin M.D.   On: 05/16/2015 11:42

## 2015-05-25 NOTE — Progress Notes (Signed)
Cardiology Office Note   Date:  05/25/2015   ID:  Clata, Bircher 1941-05-09, MRN [id number]  PCP:  Alonza Bogus, MD  Cardiologist:  Bryna Colander, NP   No chief complaint on file.     History of Present Illness: Donna [name] is a 74 y.o. female who presents for ongoing assessment and management of NICM, EF of 20%,S/P BiV pacemaker, on amiodarone for wide QRS tachycardia, hypercholesterolemia, hypertension. She comes today post hospitalization for A/C systolic CHF. She was given IV lasix and diuresed to dry wt of 116 lbs. She was found to be hypokalemic and given potassium replacement.   She comes today with some fatigue, but is maintaining her weight and avoiding salt. She occasionally eats out at [employer]. She is trying very hard to be conscious of her salt intake. PPM is interrogated by phone and she sees Dr.Taylor annually.   Past Medical History  Diagnosis Date  . Cardiomyopathy, nonischemic ([place])     Nl cors in [place]; CHF in 12/97; EF of 40% in 5/01 and 7/03, 25% in 9/04 and 4/08.  systolic murmur without valvular abnormalities by echo; refused automatic implantable cardiac defibrillator  . Left bundle branch block   . Hypertension   . Hyperlipidemia   . Cerebrovascular disease     Duplex study in 12/2006 shows atherosclerosis without focal stenosis  . Atrial flutter (HCC)     Versus ventricular tachycardia; treated with amiodarone  . Gastroesophageal reflux disease   . Ventricular tachycardia ([place])     a. PMH it lists "atrial flutter versus ventricular tachycardia treated with amiodarone" - details of this are unclear. Notes from back to 2006 indicate she has been maintained on amiodarone but do not describe further indication. Patient's sister reports pt was told she had skipped beats that could cause her to drop dead - Dr. Lattie Haw put her on amiodarone and recommended a defibrillator.  Marland Kitchen AICD (automatic cardioverter/defibrillator)  present   . Diabetes mellitus, type II ([place])     No insulin  . CHF (congestive heart failure) ([place])   . Arthritis     "fingers" (11/18/2014)    Past Surgical History  Procedure Laterality Date  . Colonoscopy  2008  . Bi-ventricular implantable cardioverter defibrillator  (crt-d)  11/18/2014  . Tonsillectomy    . Hernia repair Right   . Abdominal hysterectomy  2006  . Ep implantable device N/A 11/18/2014    Procedure: BiV ICD Insertion CRT-D;  Surgeon: Evans Lance, MD;  Location: [place] [facility];  Service: Cardiovascular;  Laterality: N/A;     Current Outpatient Prescriptions  Medication Sig Dispense Refill  . acetaminophen (TYLENOL) 500 MG tablet Take 500 mg by mouth every 6 (six) hours as needed.    Marland Kitchen aspirin 81 MG tablet Take 81 mg by mouth daily.    Marland Kitchen atorvastatin (LIPITOR) 20 MG tablet Take 1 tablet (20 mg total) by mouth daily at 6 PM. 30 tablet 12  . calcium gluconate 500 MG tablet Take 500 mg by mouth 2 (two) times daily.      . carvedilol (COREG) 3.125 MG tablet TAKE ONE TABLET TWICE DAILY 60 tablet 11  . fish oil-omega-3 fatty acids 1000 MG capsule Take 1 capsule by mouth 3 (three) times daily.      . furosemide (LASIX) 40 MG tablet Take 40 mg by mouth 2 (two) times daily.    . insulin detemir (LEVEMIR) 100 UNIT/ML injection Inject 12 Units  into the skin at bedtime.    Marland Kitchen levothyroxine (SYNTHROID, LEVOTHROID) 75 MCG tablet Take 75 mcg by mouth daily before breakfast.    . linagliptin (TRADJENTA) 5 MG TABS tablet Take 5 mg by mouth daily.    Marland Kitchen losartan (COZAAR) 100 MG tablet Take 100 mg by mouth daily.    Marland Kitchen omeprazole (PRILOSEC) 20 MG capsule Take 20 mg by mouth 2 (two) times daily.      . potassium chloride (KLOR-CON) 20 MEQ packet Take 20 mEq by mouth 2 (two) times daily.      Marland Kitchen spironolactone (ALDACTONE) 25 MG tablet Take 25 mg by mouth daily.       No current facility-administered medications for this visit.    Allergies:   Codeine and Decongestant    Social  History:  The patient  reports that she has never smoked. She has never used smokeless tobacco. She reports that she does not drink alcohol or use illicit drugs.   Family History:  The patient's family history includes Diabetes in her mother and sister; Heart attack in her brother; Kidney disease in her mother.    ROS: All other systems are reviewed and negative. Unless otherwise mentioned in H&P    PHYSICAL EXAM: VS:  BP 142/86 mmHg  Pulse 67  Ht 5\' 1"  (1.549 m)  Wt 118 lb (53.524 kg)  BMI 22.31 kg/m2  SpO2 99% , BMI Body mass index is 22.31 kg/(m^2). GEN: Well nourished, well developed, in no acute distress HEENT: normal Neck: no JVD, carotid bruits, or masses Cardiac: RRR; no murmurs, rubs, or gallops,no edema  Respiratory:  clear to auscultation bilaterally, normal work of breathing GI: soft, nontender, nondistended, + BS MS: no deformity or atrophy Skin: warm and dry, no rash Neuro:  Strength and sensation are intact Psych: euthymic mood, full affect   Recent Labs: 10/18/2014: TSH 10.726* 10/20/2014: Magnesium 2.1 05/16/2015: B Natriuretic Peptide 684.0* 05/17/2015: ALT 16; Hemoglobin 11.5*; Platelets 181 05/18/2015: BUN 27*; Creatinine, Ser 0.68; Potassium 3.6; Sodium 137    Lipid Panel    Component Value Date/Time   CHOL 119 04/02/2009 0950   TRIG 172 04/02/2009 0950   HDL 34 04/02/2009 0950   CHOLHDL 5.1 05/09/2008 0332   VLDL 42* 05/09/2008 0332   LDLCALC 51 04/02/2009 0950      Wt Readings from Last 3 Encounters:  05/25/15 118 lb (53.524 kg)  05/18/15 117 lb 1.6 oz (53.116 kg)  02/19/15 113 lb 8 oz (51.483 kg)     ASSESSMENT AND PLAN:  1. NICM: Recent admission for decompensation of CHF. She was diuresed with dry wt of 116 lbs. She is being mindful of her salt intake and is now on higher dose of lasix, 40 mg BID, along with spironolactone and ARB.Continue Coreg BID. I will check BMET to evaluate kidney status and potassium status. She will be seen again  in 3 months. Low salt diet is provided in writing.   2. PPM in situ: Last PPM check was on 12/15/12016.  She will continue PPM checks remotely and see Dr. Lovena Le on follow up appointments.    Current medicines are reviewed at length with the patient today.    Labs/ tests ordered today include: BMET Orders Placed This Encounter  Procedures  . Basic Metabolic Panel (BMET)  . Magnesium     Disposition:   FU with 3 months.    Signed, [name], NP  05/25/2015 4:30 PM    [place]  [facility] [address], [place], [place] [address] Phone: [phone]; Fax: [phone]

## 2015-05-25 NOTE — Telephone Encounter (Signed)
Called patient with test results. No answer. Left message to call back.  

## 2015-05-25 NOTE — Telephone Encounter (Signed)
-----   Message from Lendon Colonel, NP sent at 05/25/2015  4:40 PM EST ----- Increase potassium to 40 mEq in am, and 20 mEq in pm. Potassium should be 4.0. Awaiting Magnesium. Kidney function is doing well.

## 2015-05-28 ENCOUNTER — Encounter: Payer: Self-pay | Admitting: Cardiology

## 2015-06-03 DIAGNOSIS — H401110 Primary open-angle glaucoma, right eye, stage unspecified: Secondary | ICD-10-CM | POA: Diagnosis not present

## 2015-06-16 ENCOUNTER — Ambulatory Visit (HOSPITAL_COMMUNITY)
Admission: RE | Admit: 2015-06-16 | Discharge: 2015-06-16 | Disposition: A | Discharge status: Home / Self Care | Payer: PPO | Source: Ambulatory Visit | Attending: Pulmonary Disease | Admitting: Pulmonary Disease

## 2015-06-16 ENCOUNTER — Other Ambulatory Visit (HOSPITAL_COMMUNITY): Payer: Self-pay | Admitting: Pulmonary Disease

## 2015-06-16 DIAGNOSIS — M79605 Pain in left leg: Secondary | ICD-10-CM | POA: Insufficient documentation

## 2015-06-16 DIAGNOSIS — M25522 Pain in left elbow: Secondary | ICD-10-CM | POA: Diagnosis not present

## 2015-06-16 DIAGNOSIS — W19XXXA Unspecified fall, initial encounter: Secondary | ICD-10-CM

## 2015-06-16 DIAGNOSIS — M79602 Pain in left arm: Secondary | ICD-10-CM | POA: Insufficient documentation

## 2015-06-16 DIAGNOSIS — W1789XA Other fall from one level to another, initial encounter: Secondary | ICD-10-CM | POA: Diagnosis not present

## 2015-06-16 DIAGNOSIS — Y92488 Other paved roadways as the place of occurrence of the external cause: Secondary | ICD-10-CM | POA: Diagnosis not present

## 2015-06-16 DIAGNOSIS — M79632 Pain in left forearm: Secondary | ICD-10-CM | POA: Insufficient documentation

## 2015-06-16 DIAGNOSIS — M25552 Pain in left hip: Secondary | ICD-10-CM | POA: Diagnosis not present

## 2015-07-06 ENCOUNTER — Encounter: Payer: Self-pay | Admitting: *Deleted

## 2015-07-08 ENCOUNTER — Encounter: Payer: PPO | Attending: Pulmonary Disease | Admitting: Nutrition

## 2015-07-08 ENCOUNTER — Encounter: Payer: Self-pay | Admitting: Nutrition

## 2015-07-08 VITALS — Ht 61.0 in | Wt 121.0 lb

## 2015-07-08 DIAGNOSIS — E119 Type 2 diabetes mellitus without complications: Secondary | ICD-10-CM

## 2015-07-08 DIAGNOSIS — I5020 Unspecified systolic (congestive) heart failure: Secondary | ICD-10-CM

## 2015-07-08 NOTE — Patient Instructions (Addendum)
Goals: 1.Follow Low Sodium High Fiber Diet 2. Add fruit with meals. 3. Add 1/2 banana with breakfast instead of the juice 4. Use Mrs. Dash and other herbs and spices to cook with. 5. Walk 15-30 minutes a day as tolerated.

## 2015-07-08 NOTE — Progress Notes (Signed)
  Medical Nutrition Therapy:  Appt start time: 1100 end time:  1200.  Assessment:  Primary concerns today:  DM and Congestive Heart Failure. She lives with by herself. Cooks meals at home; baked and broiled. Eats lunch with her sister sometimes out at [facility]. Was told to be on a low salt diet. Wants to know how to read labels and how to prevent fluid build up. She says her DM is under control. Tests her blood sugars in am. Didn't bring meter. Says BS are low 100's in am.  Unsure of recent A1C.  Had pacemaker/defibrilater put in June. DIet is fairly good. Needs to cut out diet sodas and drink more water and fresh fruits and vegetables. Doesn't eat a lot processed foods. Looks at labels but want more information about sodium. She is taking 12 units of Levemir  at night. ON Trajenta also.  Denies any problems or symptoms of low blood sugars.   Lab Results  Component Value Date   HGBA1C 6.3* 10/18/2014    Preferred Learning Style:   Auditory  Visual  Hands on  No preference indicated   Learning Readiness:  Ready  Change in progress   MEDICATIONS: See list   DIETARY INTAKE:   24-hr recall:  B ( AM): Cherrios with 1% milk, Juice and coffee Snk ( AM):   L ( PM): Hamburger from [place], few fries(5), Diet Coke Snk ( PM): none D ( PM): Chicken, peas, Diet coke 50/50 with water Snk ( PM):  Beverages: water, Diet soda  Usual physical activity: Walk 1 mile per day.  Estimated energy needs: 1200-1500 calories 135 g carbohydrates 90 g protein 33 g fat  Progress Towards Goal(s):  In progress.   Nutritional Diagnosis:  NB-1.1 Food and nutrition-related knowledge deficit As related to DM.  As evidenced by A1C 6.3% .    Intervention:  Nutrition and Diabetes education provided on My Plate, CHO counting, meal planning, portion sizes, timing of meals, avoiding snacks between meals unless having a low blood sugar, target ranges for A1C and blood sugars, signs/symptoms and treatment of  hyper/hypoglycemia, monitoring blood sugars, taking medications as prescribed, benefits of exercising 30 minutes per day and prevention of complications of DM. Low Sodium diet, reading food labels and a high fiber diet.. Goals: 1.Follow Low Sodium High Fiber Diet 2. Add fruit with meals. 3. Add 1/2 banana with breakfast instead of the juice 4. Use Mrs. Dash and other herbs and spices to cook with. 5. Walk 15-30 minutes a day as tolerated. 6. Eat 2-3 carb choices to prevent low blood sugars.   Teaching Method Utilized:  Visual Auditory Hands on  Handouts given during visit include:  The Plate Method  Low Sodium Diet/CHF        Meal Plan Card  Barriers to learning/adherence to lifestyle change: None  Demonstrated degree of understanding via:  Teach Back   Monitoring/Evaluation:  Dietary intake, exercise, meal planning, food journal , and body weight in 1 month(s).

## 2015-07-24 ENCOUNTER — Telehealth: Payer: Self-pay | Admitting: Adult Health

## 2015-07-24 MED ORDER — POTASSIUM CHLORIDE 20 MEQ PO PACK: 20.0000 meq | PACK | Freq: Two times a day (BID) | ORAL | Status: DC

## 2015-07-24 NOTE — Telephone Encounter (Signed)
SENT IN REFILL 

## 2015-07-24 NOTE — Telephone Encounter (Signed)
Pt is out of her Potassium due to the change KL made on her last visit, she is now out of her Potassium --pt uses [employer]

## 2015-08-10 ENCOUNTER — Other Ambulatory Visit: Payer: Self-pay | Admitting: Cardiology

## 2015-08-20 ENCOUNTER — Ambulatory Visit (INDEPENDENT_AMBULATORY_CARE_PROVIDER_SITE_OTHER): Payer: PPO | Admitting: *Deleted

## 2015-08-20 DIAGNOSIS — I428 Other cardiomyopathies: Secondary | ICD-10-CM

## 2015-08-20 DIAGNOSIS — I5022 Chronic systolic (congestive) heart failure: Secondary | ICD-10-CM

## 2015-08-20 DIAGNOSIS — I429 Cardiomyopathy, unspecified: Secondary | ICD-10-CM | POA: Diagnosis not present

## 2015-08-20 NOTE — Progress Notes (Signed)
Remote ICD transmission.   

## 2015-08-26 ENCOUNTER — Encounter: Payer: Self-pay | Admitting: Cardiovascular Disease

## 2015-08-26 ENCOUNTER — Ambulatory Visit (INDEPENDENT_AMBULATORY_CARE_PROVIDER_SITE_OTHER): Payer: PPO | Admitting: Cardiovascular Disease

## 2015-08-26 VITALS — BP 116/62 | HR 87 | Ht 61.0 in | Wt 122.0 lb

## 2015-08-26 DIAGNOSIS — E785 Hyperlipidemia, unspecified: Secondary | ICD-10-CM

## 2015-08-26 DIAGNOSIS — I10 Essential (primary) hypertension: Secondary | ICD-10-CM

## 2015-08-26 DIAGNOSIS — Z95810 Presence of automatic (implantable) cardiac defibrillator: Secondary | ICD-10-CM | POA: Diagnosis not present

## 2015-08-26 DIAGNOSIS — I5022 Chronic systolic (congestive) heart failure: Secondary | ICD-10-CM | POA: Diagnosis not present

## 2015-08-26 DIAGNOSIS — I429 Cardiomyopathy, unspecified: Secondary | ICD-10-CM | POA: Diagnosis not present

## 2015-08-26 DIAGNOSIS — I428 Other cardiomyopathies: Secondary | ICD-10-CM

## 2015-08-26 NOTE — Patient Instructions (Signed)
Medication Instructions:  Your physician recommends that you continue on your current medications as directed. Please refer to the Current Medication list given to you today.   Labwork: none  Testing/Procedures: none  Follow-Up: Your physician wants you to follow-up in: 6 months with Dr. Harl Bowie.  You will receive a reminder letter in the mail two months in advance. If you don't receive a letter, please call our office to schedule the follow-up appointment.   Any Other Special Instructions Will Be Listed Below (If Applicable).  Thanks for choosing [facility]!!!     If you need a refill on your cardiac medications before your next appointment, please call your pharmacy.

## 2015-08-26 NOTE — Progress Notes (Signed)
Patient ID: Donna [name], female   DOB: 19-May-1941, 75 y.o.   MRN: [id number]      SUBJECTIVE: The patient is a 75 year old woman who normally sees Dr. Harl Bowie. She has a history of nonischemic cardiomyopathy, hypertension, hyperlipidemia, and ventricular tachycardia. She has a biventricular ICD and is followed by Dr. Lovena Le. Device interrogation on 05/21/15 demonstrated normal function.  She has been bothered by sinus issues related to seasonal allergies. She denies chest pain and significant exertional dyspnea. She is able to vacuum, mop, and sweep her house without difficulty. She tries to walk 1 mile daily.  Review of Systems: As per "subjective", otherwise negative.  Allergies  Allergen Reactions  . Codeine Other (See Comments)    Reaction:  Racing heart   . Decongestant [Pseudoephedrine Hcl Er] Other (See Comments)    Reaction:  Racing heart     Current Outpatient Prescriptions  Medication Sig Dispense Refill  . acetaminophen (TYLENOL) 500 MG tablet Take 500 mg by mouth every 6 (six) hours as needed.    Marland Kitchen aspirin 81 MG tablet Take 81 mg by mouth daily.    Marland Kitchen atorvastatin (LIPITOR) 20 MG tablet Take 1 tablet (20 mg total) by mouth daily at 6 PM. 30 tablet 12  . calcium gluconate 500 MG tablet Take 500 mg by mouth 2 (two) times daily.      . carvedilol (COREG) 3.125 MG tablet TAKE ONE TABLET TWICE DAILY 60 tablet 11  . fish oil-omega-3 fatty acids 1000 MG capsule Take 1 capsule by mouth 3 (three) times daily.      . furosemide (LASIX) 40 MG tablet Take 40 mg by mouth 2 (two) times daily. Reported on 08/26/2015    . insulin detemir (LEVEMIR) 100 UNIT/ML injection Inject 12 Units into the skin at bedtime.    Marland Kitchen levothyroxine (SYNTHROID, LEVOTHROID) 75 MCG tablet Take 75 mcg by mouth daily before breakfast.    . linagliptin (TRADJENTA) 5 MG TABS tablet Take 5 mg by mouth daily.    Marland Kitchen losartan (COZAAR) 100 MG tablet Take 100 mg by mouth daily.    Marland Kitchen losartan (COZAAR) 100 MG tablet TAKE  ONE (1) TABLET EACH DAY 90 tablet 1  . omeprazole (PRILOSEC) 20 MG capsule Take 20 mg by mouth 2 (two) times daily.      . potassium chloride (KLOR-CON) 20 MEQ packet Take 20 mEq by mouth 2 (two) times daily. 60 tablet 1  . spironolactone (ALDACTONE) 25 MG tablet Take 25 mg by mouth daily.       No current facility-administered medications for this visit.    Past Medical History  Diagnosis Date  . Cardiomyopathy, nonischemic ([place])     Nl cors in [place]; CHF in 12/97; EF of 40% in 5/01 and 7/03, 25% in 9/04 and 4/08.  systolic murmur without valvular abnormalities by echo; refused automatic implantable cardiac defibrillator  . Left bundle branch block   . Hypertension   . Hyperlipidemia   . Cerebrovascular disease     Duplex study in 12/2006 shows atherosclerosis without focal stenosis  . Atrial flutter (H[facility])     Versus ventricular tachycardia; treated with amiodarone  . Gastroesophageal reflux disease   . Ventricular tachycardia ([place])     a. PMH it lists "atrial flutter versus ventricular tachycardia treated with amiodarone" - details of this are unclear. Notes from back to 2006 indicate she has been maintained on amiodarone but do not describe further indication. Patient's sister reports pt was told she had  skipped beats that could cause her to drop dead - Dr. Lattie Haw put her on amiodarone and recommended a defibrillator.  Marland Kitchen AICD (automatic cardioverter/defibrillator) present   . Diabetes mellitus, type II ([place])     No insulin  . CHF (congestive heart failure) ([place])   . Arthritis     "fingers" (11/18/2014)    Past Surgical History  Procedure Laterality Date  . Colonoscopy  2008  . Bi-ventricular implantable cardioverter defibrillator  (crt-d)  11/18/2014  . Tonsillectomy    . Hernia repair Right   . Abdominal hysterectomy  2006  . Ep implantable device N/A 11/18/2014    Procedure: BiV ICD Insertion CRT-D;  Surgeon: Evans Lance, MD;  Location: [facility];   Service: Cardiovascular;  Laterality: N/A;    Social History   Social History  . Marital Status: Widowed    Spouse Name: N/A  . Number of Children: N/A  . Years of Education: N/A   Occupational History  . Retired    Social History Main Topics  . Smoking status: Never Smoker   . Smokeless tobacco: Never Used  . Alcohol Use: No  . Drug Use: No  . Sexual Activity: No   Other Topics Concern  . Not on file   Social History Narrative   Lives in [place] with son and grandson   Physically active     Filed Vitals:   08/26/15 0901  BP: 116/62  Pulse: 87  Height: 5\' 1"  (1.549 m)  Weight: 122 lb (55.339 kg)  SpO2: 96%    PHYSICAL EXAM General: NAD HEENT: Normal. Neck: No JVD, no thyromegaly. Lungs: Clear to auscultation bilaterally with normal respiratory effort. CV: Nondisplaced PMI.  Regular rate and rhythm, normal S1/split S2, no S3/S4, no murmur. No pretibial or periankle edema.    Abdomen: Soft, nontender, no distention.  Neurologic: Alert and oriented.  Psych: Normal affect. Skin: Normal. Musculoskeletal: No gross deformities.  ECG: Most recent ECG reviewed.      ASSESSMENT AND PLAN: 1. Chronic systolic heart failure with BiV ICD: Euvolemic with NYHA class II symptoms on current diuretic regimen. Device interrogation on 05/21/15 demonstrated normal function. No changes to therapy.  2. Essential HTN: Controlled. No changes.  3. Hyperlipidemia: Continue statin.  Dispo: f/u 6 months with Dr. Neldon Labella, M.D., F.A.C.C.

## 2015-08-31 ENCOUNTER — Encounter: Payer: Self-pay | Admitting: Nutrition

## 2015-09-18 ENCOUNTER — Other Ambulatory Visit: Payer: Self-pay | Admitting: "Endocrinology

## 2015-09-22 LAB — CUP PACEART REMOTE DEVICE CHECK
Battery Voltage: 3.07 V
Brady Statistic AP VS Percent: 1.9 %
Brady Statistic AS VP Percent: 16 %
Brady Statistic RA Percent Paced: 83 %
Date Time Interrogation Session: 20170316060017
HighPow Impedance: 63 Ohm
HighPow Impedance: 63 Ohm
Implantable Lead Implant Date: 20160614
Implantable Lead Implant Date: 20160614
Implantable Lead Location: 753858
Implantable Lead Location: 753859
Implantable Lead Model: 7122
Lead Channel Impedance Value: 1100 Ohm
Lead Channel Pacing Threshold Pulse Width: 0.5 ms
Lead Channel Sensing Intrinsic Amplitude: 11.7 mV
Lead Channel Setting Pacing Amplitude: 2 V
Lead Channel Setting Pacing Pulse Width: 0.5 ms
Lead Channel Setting Sensing Sensitivity: 0.5 mV
MDC IDC LEAD IMPLANT DT: 20160614
MDC IDC LEAD LOCATION: 753860
MDC IDC MSMT BATTERY REMAINING LONGEVITY: 76 mo
MDC IDC MSMT BATTERY REMAINING PERCENTAGE: 88 %
MDC IDC MSMT LEADCHNL LV PACING THRESHOLD AMPLITUDE: 1 V
MDC IDC MSMT LEADCHNL RA IMPEDANCE VALUE: 540 Ohm
MDC IDC MSMT LEADCHNL RA SENSING INTR AMPL: 2 mV
MDC IDC MSMT LEADCHNL RV IMPEDANCE VALUE: 510 Ohm
MDC IDC SET LEADCHNL LV PACING AMPLITUDE: 2 V
MDC IDC SET LEADCHNL RV PACING AMPLITUDE: 2.5 V
MDC IDC SET LEADCHNL RV PACING PULSEWIDTH: 0.5 ms
MDC IDC STAT BRADY AP VP PERCENT: 82 %
MDC IDC STAT BRADY AS VS PERCENT: 1 %
Pulse Gen Serial Number: 1210376

## 2015-09-23 ENCOUNTER — Encounter: Payer: Self-pay | Admitting: Cardiology

## 2015-10-05 ENCOUNTER — Ambulatory Visit: Payer: Self-pay | Admitting: Nutrition

## 2015-12-14 ENCOUNTER — Encounter: Payer: Self-pay | Admitting: Internal Medicine

## 2015-12-14 ENCOUNTER — Ambulatory Visit (INDEPENDENT_AMBULATORY_CARE_PROVIDER_SITE_OTHER): Payer: PPO | Admitting: Internal Medicine

## 2015-12-14 VITALS — BP 106/62 | HR 94 | Ht 61.0 in | Wt 123.0 lb

## 2015-12-14 DIAGNOSIS — I5022 Chronic systolic (congestive) heart failure: Secondary | ICD-10-CM

## 2015-12-14 DIAGNOSIS — I428 Other cardiomyopathies: Secondary | ICD-10-CM

## 2015-12-14 DIAGNOSIS — I429 Cardiomyopathy, unspecified: Secondary | ICD-10-CM | POA: Diagnosis not present

## 2015-12-14 LAB — CUP PACEART INCLINIC DEVICE CHECK
Brady Statistic RV Percent Paced: 98 %
Date Time Interrogation Session: 20170710104438
HIGH POWER IMPEDANCE MEASURED VALUE: 65.25 Ohm
Implantable Lead Implant Date: 20160614
Implantable Lead Implant Date: 20160614
Implantable Lead Location: 753859
Implantable Lead Location: 753860
Implantable Lead Model: 7122
Lead Channel Impedance Value: 1100 Ohm
Lead Channel Pacing Threshold Amplitude: 0.5 V
Lead Channel Pacing Threshold Amplitude: 0.5 V
Lead Channel Pacing Threshold Amplitude: 1.25 V
Lead Channel Pacing Threshold Pulse Width: 0.5 ms
Lead Channel Pacing Threshold Pulse Width: 0.5 ms
Lead Channel Sensing Intrinsic Amplitude: 1.6 mV
Lead Channel Setting Sensing Sensitivity: 0.5 mV
MDC IDC LEAD IMPLANT DT: 20160614
MDC IDC LEAD LOCATION: 753858
MDC IDC MSMT BATTERY REMAINING LONGEVITY: 70.8
MDC IDC MSMT LEADCHNL LV PACING THRESHOLD AMPLITUDE: 1.25 V
MDC IDC MSMT LEADCHNL LV PACING THRESHOLD PULSEWIDTH: 0.5 ms
MDC IDC MSMT LEADCHNL LV PACING THRESHOLD PULSEWIDTH: 0.5 ms
MDC IDC MSMT LEADCHNL RA IMPEDANCE VALUE: 537.5 Ohm
MDC IDC MSMT LEADCHNL RA PACING THRESHOLD PULSEWIDTH: 0.5 ms
MDC IDC MSMT LEADCHNL RV IMPEDANCE VALUE: 475 Ohm
MDC IDC MSMT LEADCHNL RV PACING THRESHOLD AMPLITUDE: 0.75 V
MDC IDC MSMT LEADCHNL RV PACING THRESHOLD AMPLITUDE: 0.75 V
MDC IDC MSMT LEADCHNL RV PACING THRESHOLD PULSEWIDTH: 0.5 ms
MDC IDC MSMT LEADCHNL RV SENSING INTR AMPL: 11.7 mV
MDC IDC SET LEADCHNL LV PACING AMPLITUDE: 2 V
MDC IDC SET LEADCHNL LV PACING PULSEWIDTH: 0.5 ms
MDC IDC SET LEADCHNL RA PACING AMPLITUDE: 2 V
MDC IDC SET LEADCHNL RV PACING AMPLITUDE: 2.5 V
MDC IDC SET LEADCHNL RV PACING PULSEWIDTH: 0.5 ms
MDC IDC STAT BRADY RA PERCENT PACED: 84 %
Pulse Gen Serial Number: 1210376

## 2015-12-14 MED ORDER — LOSARTAN POTASSIUM 50 MG PO TABS: 50.0000 mg | ORAL_TABLET | Freq: Every day | ORAL | Status: DC

## 2015-12-14 NOTE — Progress Notes (Signed)
HPI Donna [name] returns today for followup after her BiV ICD implant. She is a pleasant 75 yo woman with chronic systolic heart, LBBB, HTN, and a wide QRS tachycardia for which she chronically been on amiodarone in low dose. She underwent BiV ICD implant approx. One year ago. In the interim, she has done well. No chest pain or sob. She is walking . She notes some tingling in her legs. Her main complaint is that she is dizzy at times. Allergies  Allergen Reactions  . Codeine Other (See Comments)    Reaction:  Racing heart   . Decongestant [Pseudoephedrine Hcl Er] Other (See Comments)    Reaction:  Racing heart      Current Outpatient Prescriptions  Medication Sig Dispense Refill  . acetaminophen (TYLENOL) 500 MG tablet Take 500 mg by mouth every 6 (six) hours as needed.    Marland Kitchen aspirin 81 MG tablet Take 81 mg by mouth daily.    Marland Kitchen atorvastatin (LIPITOR) 20 MG tablet Take 1 tablet (20 mg total) by mouth daily at 6 PM. 30 tablet 12  . calcium gluconate 500 MG tablet Take 500 mg by mouth 2 (two) times daily.      . carvedilol (COREG) 3.125 MG tablet TAKE ONE TABLET TWICE DAILY 60 tablet 11  . fish oil-omega-3 fatty acids 1000 MG capsule Take 1 capsule by mouth 3 (three) times daily.      . furosemide (LASIX) 40 MG tablet Take 40 mg by mouth 2 (two) times daily. Reported on 08/26/2015    . insulin detemir (LEVEMIR) 100 UNIT/ML injection Inject 12 Units into the skin at bedtime.    Marland Kitchen levothyroxine (SYNTHROID, LEVOTHROID) 75 MCG tablet Take 75 mcg by mouth daily before breakfast.    . linagliptin (TRADJENTA) 5 MG TABS tablet Take 5 mg by mouth daily.    Marland Kitchen losartan (COZAAR) 100 MG tablet Take 100 mg by mouth daily.    Marland Kitchen losartan (COZAAR) 100 MG tablet TAKE ONE (1) TABLET EACH DAY 90 tablet 1  . omeprazole (PRILOSEC) 20 MG capsule Take 20 mg by mouth 2 (two) times daily.      . potassium chloride (KLOR-CON) 20 MEQ packet Take 20 mEq by mouth 2 (two) times daily. 60 tablet 1  . spironolactone  (ALDACTONE) 25 MG tablet Take 25 mg by mouth daily.       No current facility-administered medications for this visit.     Past Medical History  Diagnosis Date  . Cardiomyopathy, nonischemic ([place])     Nl cors in [place]; CHF in 12/97; EF of 40% in 5/01 and 7/03, 25% in 9/04 and 4/08.  systolic murmur without valvular abnormalities by echo; refused automatic implantable cardiac defibrillator  . Left bundle branch block   . Hypertension   . Hyperlipidemia   . Cerebrovascular disease     Duplex study in 12/2006 shows atherosclerosis without focal stenosis  . Atrial flutter ([facility])     Versus ventricular tachycardia; treated with amiodarone  . Gastroesophageal reflux disease   . Ventricular tachycardia ([place])     a. PMH it lists "atrial flutter versus ventricular tachycardia treated with amiodarone" - details of this are unclear. Notes from back to 2006 indicate she has been maintained on amiodarone but do not describe further indication. Patient's sister reports pt was told she had skipped beats that could cause her to drop dead - Dr. Lattie Haw put her on amiodarone and recommended a defibrillator.  Marland Kitchen AICD (automatic  cardioverter/defibrillator) present   . Diabetes mellitus, type II ([place])     No insulin  . CHF (congestive heart failure) ([place])   . Arthritis     "fingers" (11/18/2014)    ROS:   All systems reviewed and negative except as noted in the HPI.   Past Surgical History  Procedure Laterality Date  . Colonoscopy  2008  . Bi-ventricular implantable cardioverter defibrillator  (crt-d)  11/18/2014  . Tonsillectomy    . Hernia repair Right   . Abdominal hysterectomy  2006  . Ep implantable device N/A 11/18/2014    Procedure: BiV ICD Insertion CRT-D;  Surgeon: Evans Lance, MD;  Location: [facility];  Service: Cardiovascular;  Laterality: N/A;     Family History  Problem Relation Age of Onset  . Diabetes Mother   . Kidney disease Mother   . Diabetes Sister   .  Heart attack Brother      Social History   Social History  . Marital Status: Widowed    Spouse Name: N/A  . Number of Children: N/A  . Years of Education: N/A   Occupational History  . Retired    Social History Main Topics  . Smoking status: Never Smoker   . Smokeless tobacco: Never Used  . Alcohol Use: No  . Drug Use: No  . Sexual Activity: No   Other Topics Concern  . Not on file   Social History Narrative   Lives in [place] with son and grandson   Physically active     BP 106/62 mmHg  Pulse 94  Ht 5\' 1"  (1.549 m)  Wt 123 lb (55.792 kg)  BMI 23.25 kg/m2  SpO2 98%  Physical Exam:  Well appearing 75 yo woman, NAD HEENT: Unremarkable Neck:  6 cm JVD, no thyromegally Lymphatics:  No adenopathy Back:  No CVA tenderness Lungs:  Clear with no wheezes. Well healed ICD incision. HEART:  Regular rate rhythm, no murmurs, no rubs, no clicks, split S2 Abd:  soft, positive bowel sounds, no organomegally, no rebound, no guarding Ext:  2 plus pulses, no edema, no cyanosis, no clubbing Skin:  No rashes no nodules Neuro:  CN II through XII intact, motor grossly intact   DEVICE  Normal device function.  See PaceArt for details.   Assess/Plan: 1. Dizzy - I have asked her to reduce her losartan to 50 mg daily from 100 mg daily. Her blood pressure has been low. 2. Chronic systolic heart failure - she is class 2. She will continue her current meds except as noted above. 3. VT - she has had no ventricular arrhythmias and is on no anti-arrhythmic meds. 4. ICD - her [employer] BiV ICD is working normally. Will follow.  Mikle Bosworth.D.

## 2015-12-14 NOTE — Patient Instructions (Addendum)
Your physician wants you to follow-up in: 1 Year with Dr. Lovena Le. You will receive a reminder letter in the mail two months in advance. If you don't receive a letter, please call our office to schedule the follow-up appointment.  Remote monitoring is used to monitor your Pacemaker of ICD from home. This monitoring reduces the number of office visits required to check your device to one time per year. It allows [place] to keep an eye on the functioning of your device to ensure it is working properly. You are scheduled for a device check from home on 03/14/16. You may send your transmission at any time that day. If you have a wireless device, the transmission will be sent automatically. After your physician reviews your transmission, you will receive a postcard with your next transmission date.  Your physician has recommended you make the following change in your medication: Decrease Losartan to 50 mg Daily   If you need a refill on your cardiac medications before your next appointment, please call your pharmacy.  Thank you for choosing [facility]!

## 2015-12-29 ENCOUNTER — Emergency Department (HOSPITAL_COMMUNITY): Payer: PPO

## 2015-12-29 ENCOUNTER — Encounter (HOSPITAL_COMMUNITY): Payer: Self-pay | Admitting: *Deleted

## 2015-12-29 ENCOUNTER — Observation Stay (HOSPITAL_COMMUNITY)
Admission: EM | Admit: 2015-12-29 | Discharge: 2015-12-30 | Disposition: A | Discharge status: Home / Self Care | Payer: PPO | Attending: Pulmonary Disease | Admitting: Pulmonary Disease

## 2015-12-29 ENCOUNTER — Other Ambulatory Visit: Payer: Self-pay | Admitting: *Deleted

## 2015-12-29 DIAGNOSIS — I4729 Other ventricular tachycardia: Secondary | ICD-10-CM

## 2015-12-29 DIAGNOSIS — Z95810 Presence of automatic (implantable) cardiac defibrillator: Secondary | ICD-10-CM

## 2015-12-29 DIAGNOSIS — E119 Type 2 diabetes mellitus without complications: Secondary | ICD-10-CM

## 2015-12-29 DIAGNOSIS — Z794 Long term (current) use of insulin: Secondary | ICD-10-CM | POA: Insufficient documentation

## 2015-12-29 DIAGNOSIS — I110 Hypertensive heart disease with heart failure: Secondary | ICD-10-CM | POA: Diagnosis not present

## 2015-12-29 DIAGNOSIS — R0902 Hypoxemia: Secondary | ICD-10-CM | POA: Diagnosis not present

## 2015-12-29 DIAGNOSIS — J9601 Acute respiratory failure with hypoxia: Secondary | ICD-10-CM | POA: Diagnosis not present

## 2015-12-29 DIAGNOSIS — Z9111 Patient's noncompliance with dietary regimen: Secondary | ICD-10-CM | POA: Diagnosis not present

## 2015-12-29 DIAGNOSIS — I10 Essential (primary) hypertension: Secondary | ICD-10-CM | POA: Insufficient documentation

## 2015-12-29 DIAGNOSIS — Z7982 Long term (current) use of aspirin: Secondary | ICD-10-CM | POA: Insufficient documentation

## 2015-12-29 DIAGNOSIS — R0602 Shortness of breath: Secondary | ICD-10-CM | POA: Diagnosis present

## 2015-12-29 DIAGNOSIS — R140 Abdominal distension (gaseous): Secondary | ICD-10-CM | POA: Insufficient documentation

## 2015-12-29 DIAGNOSIS — Z79899 Other long term (current) drug therapy: Secondary | ICD-10-CM | POA: Diagnosis not present

## 2015-12-29 DIAGNOSIS — I472 Ventricular tachycardia: Secondary | ICD-10-CM | POA: Diagnosis not present

## 2015-12-29 DIAGNOSIS — I509 Heart failure, unspecified: Secondary | ICD-10-CM | POA: Diagnosis not present

## 2015-12-29 DIAGNOSIS — E785 Hyperlipidemia, unspecified: Secondary | ICD-10-CM | POA: Insufficient documentation

## 2015-12-29 DIAGNOSIS — I5023 Acute on chronic systolic (congestive) heart failure: Secondary | ICD-10-CM | POA: Diagnosis present

## 2015-12-29 DIAGNOSIS — M1990 Unspecified osteoarthritis, unspecified site: Secondary | ICD-10-CM | POA: Diagnosis not present

## 2015-12-29 LAB — CBC WITH DIFFERENTIAL/PLATELET
BASOS PCT: 0 %
Basophils Absolute: 0 10*3/uL (ref 0.0–0.1)
EOS ABS: 0.1 10*3/uL (ref 0.0–0.7)
Eosinophils Relative: 1 %
HCT: 36.1 % (ref 36.0–46.0)
HEMOGLOBIN: 12 g/dL (ref 12.0–15.0)
Lymphocytes Relative: 13 %
Lymphs Abs: 1.3 10*3/uL (ref 0.7–4.0)
MCH: 32.3 pg (ref 26.0–34.0)
MCHC: 33.2 g/dL (ref 30.0–36.0)
MCV: 97.3 fL (ref 78.0–100.0)
MONO ABS: 0.5 10*3/uL (ref 0.1–1.0)
MONOS PCT: 5 %
NEUTROS PCT: 81 %
Neutro Abs: 7.9 10*3/uL — ABNORMAL HIGH (ref 1.7–7.7)
Platelets: 154 10*3/uL (ref 150–400)
RBC: 3.71 MIL/uL — ABNORMAL LOW (ref 3.87–5.11)
RDW: 12.9 % (ref 11.5–15.5)
WBC: 9.8 10*3/uL (ref 4.0–10.5)

## 2015-12-29 LAB — URINALYSIS, ROUTINE W REFLEX MICROSCOPIC
BILIRUBIN URINE: NEGATIVE
Glucose, UA: NEGATIVE mg/dL
Ketones, ur: NEGATIVE mg/dL
Leukocytes, UA: NEGATIVE
Nitrite: NEGATIVE
PROTEIN: NEGATIVE mg/dL
Specific Gravity, Urine: 1.015 (ref 1.005–1.030)
pH: 5.5 (ref 5.0–8.0)

## 2015-12-29 LAB — BLOOD GAS, ARTERIAL
ACID-BASE EXCESS: 3.1 mmol/L — AB (ref 0.0–2.0)
Bicarbonate: 27 mEq/L — ABNORMAL HIGH (ref 20.0–24.0)
DRAWN BY: 213101
Delivery systems: POSITIVE
Expiratory PAP: 6
FIO2: 50
Inspiratory PAP: 12
O2 SAT: 98.8 %
PCO2 ART: 43.5 mmHg (ref 35.0–45.0)
PO2 ART: 140 mmHg — AB (ref 80.0–100.0)
Patient temperature: 37
TCO2: 16.9 mmol/L (ref 0–100)
pH, Arterial: 7.415 (ref 7.350–7.450)

## 2015-12-29 LAB — COMPREHENSIVE METABOLIC PANEL
ALK PHOS: 45 U/L (ref 38–126)
ALT: 18 U/L (ref 14–54)
AST: 21 U/L (ref 15–41)
Albumin: 4.1 g/dL (ref 3.5–5.0)
Anion gap: 8 (ref 5–15)
BILIRUBIN TOTAL: 0.7 mg/dL (ref 0.3–1.2)
BUN: 26 mg/dL — ABNORMAL HIGH (ref 6–20)
CALCIUM: 8.8 mg/dL — AB (ref 8.9–10.3)
CO2: 26 mmol/L (ref 22–32)
CREATININE: 0.79 mg/dL (ref 0.44–1.00)
Chloride: 100 mmol/L — ABNORMAL LOW (ref 101–111)
Glucose, Bld: 217 mg/dL — ABNORMAL HIGH (ref 65–99)
Potassium: 3.6 mmol/L (ref 3.5–5.1)
SODIUM: 134 mmol/L — AB (ref 135–145)
TOTAL PROTEIN: 7 g/dL (ref 6.5–8.1)

## 2015-12-29 LAB — BASIC METABOLIC PANEL
ANION GAP: 8 (ref 5–15)
BUN: 25 mg/dL — AB (ref 6–20)
CALCIUM: 8.9 mg/dL (ref 8.9–10.3)
CO2: 30 mmol/L (ref 22–32)
CREATININE: 0.72 mg/dL (ref 0.44–1.00)
Chloride: 99 mmol/L — ABNORMAL LOW (ref 101–111)
GFR calc Af Amer: >60 mL/min (ref >60)
GLUCOSE: 154 mg/dL — AB (ref 65–99)
Potassium: 3.5 mmol/L (ref 3.5–5.1)
Sodium: 137 mmol/L (ref 135–145)

## 2015-12-29 LAB — GLUCOSE, CAPILLARY
GLUCOSE-CAPILLARY: 146 mg/dL — AB (ref 65–99)
GLUCOSE-CAPILLARY: 151 mg/dL — AB (ref 65–99)
Glucose-Capillary: 153 mg/dL — ABNORMAL HIGH (ref 65–99)
Glucose-Capillary: 142 mg/dL — ABNORMAL HIGH (ref 65–99)
Glucose-Capillary: 92 mg/dL (ref 65–99)

## 2015-12-29 LAB — TROPONIN I
TROPONIN I: 0.05 ng/mL — AB (ref <0.03)
TROPONIN I: 0.06 ng/mL — AB (ref <0.03)
TROPONIN I: 0.06 ng/mL — AB (ref <0.03)
TROPONIN I: 0.05 ng/mL — AB (ref <0.03)

## 2015-12-29 LAB — URINE MICROSCOPIC-ADD ON

## 2015-12-29 LAB — TSH: TSH: 3.009 u[IU]/mL (ref 0.350–4.500)

## 2015-12-29 LAB — BRAIN NATRIURETIC PEPTIDE: B Natriuretic Peptide: 872 pg/mL — ABNORMAL HIGH (ref 0.0–100.0)

## 2015-12-29 LAB — CBC
HCT: 37.7 % (ref 36.0–46.0)
Hemoglobin: 12.5 g/dL (ref 12.0–15.0)
MCH: 32.4 pg (ref 26.0–34.0)
MCHC: 33.2 g/dL (ref 30.0–36.0)
MCV: 97.7 fL (ref 78.0–100.0)
PLATELETS: 160 10*3/uL (ref 150–400)
RBC: 3.86 MIL/uL — AB (ref 3.87–5.11)
RDW: 13 % (ref 11.5–15.5)
WBC: 8.4 10*3/uL (ref 4.0–10.5)

## 2015-12-29 MED ORDER — LEVOTHYROXINE SODIUM 75 MCG PO TABS
75.0000 ug | ORAL_TABLET | Freq: Every day | ORAL | Status: DC
  Administered 2015-12-29 – 2015-12-30 (×2): 75 ug via ORAL
  Filled 2015-12-29 (×2): qty 1

## 2015-12-29 MED ORDER — ASPIRIN EC 81 MG PO TBEC
81.0000 mg | DELAYED_RELEASE_TABLET | Freq: Every day | ORAL | Status: DC
  Administered 2015-12-29 – 2015-12-30 (×2): 81 mg via ORAL
  Filled 2015-12-29 (×2): qty 1

## 2015-12-29 MED ORDER — PANTOPRAZOLE SODIUM 40 MG PO TBEC
40.0000 mg | DELAYED_RELEASE_TABLET | Freq: Every day | ORAL | Status: DC
  Administered 2015-12-29 – 2015-12-30 (×2): 40 mg via ORAL
  Filled 2015-12-29 (×2): qty 1

## 2015-12-29 MED ORDER — LINAGLIPTIN 5 MG PO TABS
5.0000 mg | ORAL_TABLET | Freq: Every day | ORAL | Status: DC
  Administered 2015-12-29 – 2015-12-30 (×2): 5 mg via ORAL
  Filled 2015-12-29 (×2): qty 1

## 2015-12-29 MED ORDER — CARVEDILOL 3.125 MG PO TABS
3.1250 mg | ORAL_TABLET | Freq: Two times a day (BID) | ORAL | Status: DC
  Administered 2015-12-29 – 2015-12-30 (×3): 3.125 mg via ORAL
  Filled 2015-12-29 (×3): qty 1

## 2015-12-29 MED ORDER — NITROGLYCERIN 2 % TD OINT
1.0000 [in_us] | TOPICAL_OINTMENT | Freq: Once | TRANSDERMAL | Status: AC
  Administered 2015-12-29: 1 [in_us] via TOPICAL
  Filled 2015-12-29: qty 1

## 2015-12-29 MED ORDER — CALCIUM GLUCONATE 500 MG PO TABS
500.0000 mg | ORAL_TABLET | Freq: Two times a day (BID) | ORAL | Status: DC
  Filled 2015-12-29 (×3): qty 1

## 2015-12-29 MED ORDER — FUROSEMIDE 10 MG/ML IJ SOLN
60.0000 mg | Freq: Once | INTRAMUSCULAR | Status: AC
  Administered 2015-12-29: 60 mg via INTRAVENOUS
  Filled 2015-12-29: qty 6

## 2015-12-29 MED ORDER — HEPARIN SODIUM (PORCINE) 5000 UNIT/ML IJ SOLN
5000.0000 [IU] | Freq: Three times a day (TID) | INTRAMUSCULAR | Status: DC
  Administered 2015-12-29 – 2015-12-30 (×4): 5000 [IU] via SUBCUTANEOUS
  Filled 2015-12-29 (×4): qty 1

## 2015-12-29 MED ORDER — SPIRONOLACTONE 25 MG PO TABS
25.0000 mg | ORAL_TABLET | Freq: Every day | ORAL | Status: DC
  Administered 2015-12-29 – 2015-12-30 (×2): 25 mg via ORAL
  Filled 2015-12-29 (×2): qty 1

## 2015-12-29 MED ORDER — POTASSIUM CHLORIDE 20 MEQ PO PACK
20.0000 meq | PACK | Freq: Two times a day (BID) | ORAL | Status: DC
  Administered 2015-12-29: 20 meq via ORAL
  Filled 2015-12-29 (×2): qty 1

## 2015-12-29 MED ORDER — FUROSEMIDE 40 MG PO TABS
40.0000 mg | ORAL_TABLET | Freq: Two times a day (BID) | ORAL | Status: DC
  Administered 2015-12-29 – 2015-12-30 (×3): 40 mg via ORAL
  Filled 2015-12-29: qty 2
  Filled 2015-12-29 (×2): qty 1

## 2015-12-29 MED ORDER — POTASSIUM CHLORIDE CRYS ER 20 MEQ PO TBCR
20.0000 meq | EXTENDED_RELEASE_TABLET | Freq: Two times a day (BID) | ORAL | Status: DC
  Administered 2015-12-29 – 2015-12-30 (×2): 20 meq via ORAL
  Filled 2015-12-29 (×2): qty 1

## 2015-12-29 MED ORDER — ATORVASTATIN CALCIUM 20 MG PO TABS
20.0000 mg | ORAL_TABLET | Freq: Every day | ORAL | Status: DC
  Administered 2015-12-29: 20 mg via ORAL
  Filled 2015-12-29: qty 1

## 2015-12-29 MED ORDER — INSULIN ASPART 100 UNIT/ML ~~LOC~~ SOLN: 0.0000 [IU] | Freq: Every day | SUBCUTANEOUS | Status: DC

## 2015-12-29 MED ORDER — LOSARTAN POTASSIUM 50 MG PO TABS
50.0000 mg | ORAL_TABLET | Freq: Every day | ORAL | Status: DC
  Administered 2015-12-29 – 2015-12-30 (×2): 50 mg via ORAL
  Filled 2015-12-29 (×2): qty 1

## 2015-12-29 MED ORDER — OMEGA-3-ACID ETHYL ESTERS 1 G PO CAPS
1.0000 | ORAL_CAPSULE | Freq: Three times a day (TID) | ORAL | Status: DC
  Administered 2015-12-29 – 2015-12-30 (×4): 1 g via ORAL
  Filled 2015-12-29 (×4): qty 1

## 2015-12-29 MED ORDER — CALCIUM CARBONATE ANTACID 500 MG PO CHEW
1.0000 | CHEWABLE_TABLET | Freq: Two times a day (BID) | ORAL | Status: DC
  Administered 2015-12-29 – 2015-12-30 (×3): 200 mg via ORAL
  Filled 2015-12-29 (×3): qty 1

## 2015-12-29 MED ORDER — ACETAMINOPHEN 500 MG PO TABS: 500.0000 mg | ORAL_TABLET | Freq: Four times a day (QID) | ORAL | Status: DC | PRN

## 2015-12-29 MED ORDER — INSULIN ASPART 100 UNIT/ML ~~LOC~~ SOLN
0.0000 [IU] | Freq: Three times a day (TID) | SUBCUTANEOUS | Status: DC
  Administered 2015-12-29 – 2015-12-30 (×3): 2 [IU] via SUBCUTANEOUS
  Administered 2015-12-30: 3 [IU] via SUBCUTANEOUS

## 2015-12-29 NOTE — ED Provider Notes (Signed)
[facility] DEPT Provider Note   CSN: [id number] Arrival date & time: 12/29/15  [id number]  First Provider Contact:  12:40 AM By signing my name below, I, Dyke Brackett, attest that this documentation has been prepared under the direction and in the presence of Rolland Porter, MD . Electronically Signed: Dyke Brackett, Scribe. 12/29/2015. 1:20 AM.    History   Chief Complaint Chief Complaint  Patient presents with  . Shortness of Breath   The history is provided by the patient and the EMS personnel. No language interpreter was used.    HPI LEVEL FIVE CAVEAT DUE TO RESPIRATORY DISTRESS  Donna [name] is a 75 y.o. female with PMHx of CHF who presents to the Emergency Department brought in by EMS complaining of sudden onset shortness of breath onset one hour ago. Per EMS, her initial O2 was 75% on scene. No medication given by EMS. Pt arrived on bi-pap machine. She states she was experiencing periumbilical pain this morning that has persisted. She denies chest pain, vomiting and diarrhea. She has PSHx of hysterectomy. Pt denies leg swelling.   The history is provided by the patient and the EMS personnel. No language interpreter was used.   PCP Dr Luan Pulling  Past Medical History:  Diagnosis Date  . AICD (automatic cardioverter/defibrillator) present   . Arthritis    "fingers" (11/18/2014)  . Atrial flutter (HCC)    Versus ventricular tachycardia; treated with amiodarone  . Cardiomyopathy, nonischemic ([place])    Nl cors in [place]; CHF in 12/97; EF of 40% in 5/01 and 7/03, 25% in 9/04 and 4/08.  systolic murmur without valvular abnormalities by echo; refused automatic implantable cardiac defibrillator  . Cerebrovascular disease    Duplex study in 12/2006 shows atherosclerosis without focal stenosis  . CHF (congestive heart failure) ([place])   . Diabetes mellitus, type II ([place])    No insulin  . Gastroesophageal reflux disease   . Hyperlipidemia   . Hypertension   . Left bundle branch  block   . Ventricular tachycardia ([place])    a. PMH it lists "atrial flutter versus ventricular tachycardia treated with amiodarone" - details of this are unclear. Notes from back to 2006 indicate she has been maintained on amiodarone but do not describe further indication. Patient's sister reports pt was told she had skipped beats that could cause her to drop dead - Dr. Lattie Haw put her on amiodarone and recommended a defibrillator.    Patient Active Problem List   Diagnosis Date Noted  . Hypokalemia 05/18/2015  . Acute on chronic systolic congestive heart failure ([place]) 05/16/2015  . Acute on chronic systolic (congestive) heart failure ([place]) 05/16/2015  . Chronic systolic heart failure ([place]) 11/18/2014  . NICM (nonischemic cardiomyopathy) ([place]) 11/18/2014  . Acute on chronic systolic heart failure ([place]) 10/20/2014  . Acute respiratory failure with hypoxia ([place]) 10/18/2014  . CHF exacerbation ([place]) 07/22/2014  . Acute exacerbation of CHF (congestive heart failure) ([place]) 07/22/2014  . Abnormality of gait 09/26/2013  . Weakness of left hip 09/26/2013  . Diabetes mellitus, type II ([place]) 06/15/2011  . Cardiomyopathy, nonischemic ([place])   . Hypertension   . Hypothyroidism 05/28/2009  . Left bundle branch block 05/28/2009  . GASTROESOPHAGEAL REFLUX DISEASE 05/28/2009  . Hyperlipidemia 05/14/2008  . VENTRICULAR TACHYCARDIA 05/14/2008  . CEREBROVASCULAR DISEASE 05/14/2008    Past Surgical History:  Procedure Laterality Date  . ABDOMINAL HYSTERECTOMY  2006  . BI-VENTRICULAR IMPLANTABLE CARDIOVERTER DEFIBRILLATOR  (CRT-D)  11/18/2014  . COLONOSCOPY  2008  .  EP IMPLANTABLE DEVICE N/A 11/18/2014   Procedure: BiV ICD Insertion CRT-D;  Surgeon: Evans Lance, MD;  Location: [facility];  Service: Cardiovascular;  Laterality: N/A;  . HERNIA REPAIR Right   . TONSILLECTOMY      OB History    Gravida Para Term Preterm AB Living             2   SAB TAB Ectopic Multiple Live Births                    Home Medications    Prior to Admission medications   Medication Sig Start Date End Date Taking? Authorizing Provider  acetaminophen (TYLENOL) 500 MG tablet Take 500 mg by mouth every 6 (six) hours as needed.    Historical Provider, MD  aspirin 81 MG tablet Take 81 mg by mouth daily.    Historical Provider, MD  atorvastatin (LIPITOR) 20 MG tablet Take 1 tablet (20 mg total) by mouth daily at 6 PM. 07/24/14   Sinda Du, MD  calcium gluconate 500 MG tablet Take 500 mg by mouth 2 (two) times daily.      Historical Provider, MD  carvedilol (COREG) 3.125 MG tablet TAKE ONE TABLET TWICE DAILY 05/15/15   Evans Lance, MD  fish oil-omega-3 fatty acids 1000 MG capsule Take 1 capsule by mouth 3 (three) times daily.      Historical Provider, MD  furosemide (LASIX) 40 MG tablet Take 40 mg by mouth 2 (two) times daily. Reported on 08/26/2015    Historical Provider, MD  insulin detemir (LEVEMIR) 100 UNIT/ML injection Inject 12 Units into the skin at bedtime.    Sinda Du, MD  levothyroxine (SYNTHROID, LEVOTHROID) 75 MCG tablet Take 75 mcg by mouth daily before breakfast.    Historical Provider, MD  linagliptin (TRADJENTA) 5 MG TABS tablet Take 5 mg by mouth daily.    Historical Provider, MD  losartan (COZAAR) 50 MG tablet Take 1 tablet (50 mg total) by mouth daily. 12/14/15   Evans Lance, MD  omeprazole (PRILOSEC) 20 MG capsule Take 20 mg by mouth 2 (two) times daily.      Historical Provider, MD  potassium chloride (KLOR-CON) 20 MEQ packet Take 20 mEq by mouth 2 (two) times daily. 07/24/15   Lendon Colonel, NP  spironolactone (ALDACTONE) 25 MG tablet Take 25 mg by mouth daily.      Historical Provider, MD    Family History Family History  Problem Relation Age of Onset  . Diabetes Mother   . Kidney disease Mother   . Diabetes Sister   . Heart attack Brother     Social History Social History  Substance Use Topics  . Smoking status: Never Smoker  . Smokeless tobacco:  Never Used  . Alcohol use No   Lives at home  Allergies   Codeine and Decongestant [pseudoephedrine hcl er]   Review of Systems Review of Systems  Unable to perform ROS: Severe respiratory distress    Physical Exam Updated Vital Signs BP 109/60   Pulse 79   Temp 97.7 F (36.5 C) (Oral)   Resp 18   Ht 5\' 1"  (1.549 m)   Wt 112 lb (50.8 kg)   SpO2 98%   BMI 21.16 kg/m    Vital signs normal    Physical Exam  Constitutional: She is oriented to person, place, and time.  Non-toxic appearance. She does not appear ill. No distress.  Frail elderly female wearing BiPAP  HENT:  Head: Normocephalic and atraumatic.  Right Ear: External ear normal.  Left Ear: External ear normal.  Nose: Nose normal. No mucosal edema or rhinorrhea.  Mouth/Throat: Mucous membranes are normal. No dental abscesses or uvula swelling.  Eyes: Conjunctivae and EOM are normal. Pupils are equal, round, and reactive to light.  Neck: Normal range of motion and full passive range of motion without pain. Neck supple.  Cardiovascular: Normal rate, regular rhythm and normal heart sounds.  Exam reveals no gallop and no friction rub.   No murmur heard. Pulmonary/Chest: Effort normal. No respiratory distress. She has no wheezes. She has no rhonchi. She has rales. She exhibits no tenderness and no crepitus.  Diffuse rales in all lung fields. Worse at the bases. Patient appears comfortable on the BiPAP  Abdominal: Soft. Normal appearance and bowel sounds are normal. She exhibits distension. There is tenderness. There is no rebound and no guarding.  Diistended with tenderness around the umbilicus  Musculoskeletal: Normal range of motion. She exhibits no edema or tenderness.  Moves all extremities well.   Neurological: She is alert and oriented to person, place, and time. She has normal strength. No cranial nerve deficit.  Skin: Skin is warm, dry and intact. No rash noted. No erythema. There is pallor.  Psychiatric: She  has a normal mood and affect. Her speech is normal and behavior is normal. Her mood appears not anxious.  Nursing note and vitals reviewed.    ED Treatments / Results   Medications  furosemide (LASIX) injection 60 mg (60 mg Intravenous Given 12/29/15 0058)  nitroGLYCERIN (NITROGLYN) 2 % ointment 1 inch (1 inch Topical Given 12/29/15 0058)    DIAGNOSTIC STUDIES:  Oxygen Saturation is 98% on Bi-PAP, adequate by my interpretation.    COORDINATION OF CARE:  12:51 AM Discussed treatment plan with pt at bedside and pt agreed to plan.Patient was maintained on BiPAP initiated by EMS. She was given Lasix and nitroglycerin was applied to her chest for presumed acute congestive heart failure.  Recheck at 2:30 AM nurse reports patient has had about 800 mL urine output after the lasix. Patient is noted to be lying flat in her stretcher, still on BiPAP. She states she's feeling much improved. On lung exam she still has some rales but not nearly as many as before. She states her abdomen is also feeling better. We discussed that she would need admission and she is agreeable.  02:57 AM Dr Marin Comment, will see patient for admission.   Labs (all labs ordered are listed, but only abnormal results are displayed) Results for orders placed or performed during the hospital encounter of 12/29/15  Brain natriuretic peptide  Result Value Ref Range   B Natriuretic Peptide 872.0 (H) 0.0 - 100.0 pg/mL  Comprehensive metabolic panel  Result Value Ref Range   Sodium 134 (L) 135 - 145 mmol/L   Potassium 3.6 3.5 - 5.1 mmol/L   Chloride 100 (L) 101 - 111 mmol/L   CO2 26 22 - 32 mmol/L   Glucose, Bld 217 (H) 65 - 99 mg/dL   BUN 26 (H) 6 - 20 mg/dL   Creatinine, Ser 0.79 0.44 - 1.00 mg/dL   Calcium 8.8 (L) 8.9 - 10.3 mg/dL   Total Protein 7.0 6.5 - 8.1 g/dL   Albumin 4.1 3.5 - 5.0 g/dL   AST 21 15 - 41 U/L   ALT 18 14 - 54 U/L   Alkaline Phosphatase 45 38 - 126 U/L   Total Bilirubin 0.7 0.3 -  1.2 mg/dL   GFR calc non  Af Amer >60 >60 mL/min   GFR calc Af Amer >60 >60 mL/min   Anion gap 8 5 - 15  CBC with Differential  Result Value Ref Range   WBC 9.8 4.0 - 10.5 K/uL   RBC 3.71 (L) 3.87 - 5.11 MIL/uL   Hemoglobin 12.0 12.0 - 15.0 g/dL   HCT 36.1 36.0 - 46.0 %   MCV 97.3 78.0 - 100.0 fL   MCH 32.3 26.0 - 34.0 pg   MCHC 33.2 30.0 - 36.0 g/dL   RDW 12.9 11.5 - 15.5 %   Platelets 154 150 - 400 K/uL   Neutrophils Relative % 81 %   Neutro Abs 7.9 (H) 1.7 - 7.7 K/uL   Lymphocytes Relative 13 %   Lymphs Abs 1.3 0.7 - 4.0 K/uL   Monocytes Relative 5 %   Monocytes Absolute 0.5 0.1 - 1.0 K/uL   Eosinophils Relative 1 %   Eosinophils Absolute 0.1 0.0 - 0.7 K/uL   Basophils Relative 0 %   Basophils Absolute 0.0 0.0 - 0.1 K/uL  Troponin I  Result Value Ref Range   Troponin I 0.05 (HH) <0.03 ng/mL  Urinalysis, Routine w reflex microscopic (not at [facility])  Result Value Ref Range   Color, Urine YELLOW YELLOW   APPearance CLEAR CLEAR   Specific Gravity, Urine 1.015 1.005 - 1.030   pH 5.5 5.0 - 8.0   Glucose, UA NEGATIVE NEGATIVE mg/dL   Hgb urine dipstick TRACE (A) NEGATIVE   Bilirubin Urine NEGATIVE NEGATIVE   Ketones, ur NEGATIVE NEGATIVE mg/dL   Protein, ur NEGATIVE NEGATIVE mg/dL   Nitrite NEGATIVE NEGATIVE   Leukocytes, UA NEGATIVE NEGATIVE  Urine microscopic-add on  Result Value Ref Range   Squamous Epithelial / LPF 0-5 (A) NONE SEEN   WBC, UA 0-5 0 - 5 WBC/hpf   RBC / HPF 0-5 0 - 5 RBC/hpf   Bacteria, UA MANY (A) NONE SEEN   Casts HYALINE CASTS (A) NEGATIVE   Urine-Other MUCOUS PRESENT   Blood gas, arterial ([facility] & [facility] ONLY)  Result Value Ref Range   FIO2 50.00    Delivery systems BILEVEL POSITIVE AIRWAY PRESSURE    Inspiratory PAP 12.0    Expiratory PAP 6.0    pH, Arterial 7.415 7.350 - 7.450   pCO2 arterial 43.5 35.0 - 45.0 mmHg   pO2, Arterial 140.0 (H) 80.0 - 100.0 mmHg   Bicarbonate 27.0 (H) 20.0 - 24.0 mEq/L   TCO2 16.9 0 - 100 mmol/L   Acid-Base Excess 3.1 (H) 0.0 - 2.0 mmol/L    O2 Saturation 98.8 %   Patient temperature 37.0    Collection site RIGHT RADIAL    Drawn by 213,101    Sample type ARTERIAL    Allens test (pass/fail) PASS PASS   Laboratory interpretation all normal except Elevated BNP, normal ABG on BiPAP, hyperglycemia    EKG  EKG Interpretation  Date/Time:  Tuesday December 29 2015 00:46:32 EDT Ventricular Rate:  81 PR Interval:    QRS Duration: 143 QT Interval:  440 QTC Calculation: 511 R Axis:   -74 Text Interpretation:  Atrial-sensed ventricular-paced rhythm No further analysis attempted due to paced rhythm Confirmed by Kynslee Baham  MD-I, Minah Axelrod ([id number]) on 12/29/2015 1:31:09 AM       Radiology Dg Chest Port 1 View  Result Date: 12/29/2015 CLINICAL DATA:  Initial evaluation for acute shortness of breath for 1 day. History of CHF. EXAM: PORTABLE CHEST 1 VIEW COMPARISON:  Prior radiograph from 05/16/2015. FINDINGS: Patient is slightly rotated to the right. Left-sided transvenous pacemaker/ AICD in place. Cardiomegaly stable. Mediastinal silhouette within normal limits. Aortic atherosclerosis noted. Lungs normally inflated. Diffuse vascular congestion with indistinctness of the interstitial markings, most consistent with pulmonary edema. More confluent opacity within the right lower lobe favored to reflect edema and/or summation of shadows related to patient rotation. Superimposed infection could be considered in the correct clinical setting. No definite pleural effusion. No pneumothorax. No acute osseous abnormality. IMPRESSION: 1. Cardiomegaly with mild to moderate diffuse pulmonary edema, consistent with acute CHF exacerbation. Superimposed infection could be considered in the correct clinical setting. 2. Aortic atherosclerosis. Electronically Signed   By: Jeannine Boga M.D.   On: 12/29/2015 01:03   Procedures Procedures (including critical care time)     Initial Impression / Assessment and Plan / ED Course  I have reviewed the triage vital  signs and the nursing notes.  Pertinent labs & imaging results that were available during my care of the patient were reviewed by me and considered in my medical decision making (see chart for details).   Final Clinical Impressions(s) / ED Diagnoses   Final diagnoses:  Acute on chronic congestive heart failure, unspecified congestive heart failure type ([place])  Hypoxia  Abdominal distension   Plan admission  Rolland Porter, MD, FACEP   CRITICAL CARE Performed by: Lorenzo Pereyra L Bluford Sedler Total critical care time: 39 minutes Critical care time was exclusive of separately billable procedures and treating other patients. Critical care was necessary to treat or prevent imminent or life-threatening deterioration. Critical care was time spent personally by me on the following activities: development of treatment plan with patient and/or surrogate as well as nursing, discussions with consultants, evaluation of patient's response to treatment, examination of patient, obtaining history from patient or surrogate, ordering and performing treatments and interventions, ordering and review of laboratory studies, ordering and review of radiographic studies, pulse oximetry and re-evaluation of patient's condition.   I personally performed the services described in this documentation, which was scribed in my presence. The recorded information has been reviewed and considered.  Rolland Porter, MD, Barbette Or, MD 12/29/15 0300

## 2015-12-29 NOTE — Patient Outreach (Signed)
Donna [name]) Care Management  12/29/2015  Donna [name] 03-16-1941 [id number]   Notified by Donna Amor RN, BSN - [facility] Liaison of Donna [name]'s [name] admission. I followed Donna [name] in the community last year when she was referred to [facility] after a [name] admission for CHF Exacerbation. She subsequently have AICD/BiVpacemaker device placement. I followed for a few months after device placement and referred her to [facility] case management department for ongoing telephonic case management support and tele-monitoring equipment program. Donna [name] was very engaged in self health management at that time and did very well with setting and meeting self health management goals.   Donna [name] lives in [place] in her home with an adult grandson. Her sister also lives in the area and has a close relationship with Donna [name]. When I last saw Donna [name], she had been cleared to drive and was performing all ADL's and IADL's, home management, and driving independently.   Plan: I will follow Donna [name]'s [name] progress and reach out to her by phone for transition of care assessment and care coordination upon [name] discharge.    [facility]  [phone]

## 2015-12-29 NOTE — Consult Note (Signed)
CARDIOLOGY CONSULT NOTE   Patient ID: Donna [name] MRN: [id number] DOB/AGE: [birth year] 75 y.o.  Admit Date: 12/29/2015 Referring Physician: Sinda Du MD Primary Physician: Alonza Bogus, MD Consulting Cardiologist: Kate Sable MD Primary Cardiologist: Carlyle Dolly MD Reason for Consultation:  CHF  Clinical Summary Donna [name] is a 75 y.o.female with known history of NICM, hypertension, hyperlipidemia, and ventricular tachycardia. S/P BiV ICD implant, LBBB. She presented to ER with complaints of dyspnea and orthopnea, and was diagnosed with pulmonary edema. She required BiPAP due to O2 Sat of 75%.   The patient states she was in Donna usual state of health but does have chronic generalized fatigue. She states that she and Donna [name] went out to lunch, ate some barbecue, she went shopping, came home watch some soap operas on television, a small meal later, and went to bed. She states when she became supine, she wasn't able to breathe and had to sit up. She called Donna [name] who brought Donna to the emergency room. She states she's been noticing that Donna abdomen has been a little full lately, but not distended. She does not complain of constipation or diarrhea but she has had occasional abdominal pain. She has been medically compliant. And had taken Donna medications the morning of admission.  In ER 141/95 HR 86, afebrile. BNP 872. Na 134, Creatinine 0.79. Glucose elevated at 217. Troponin slightly elevated at 0.05. CXR determined that she had diffuse pulmonary edema, acute CHF, with possible infection. EKG revealed paced rhythm. She was treated with lasix 60 mg IV and NTG 2% topically. Has diuresed 2 liters per I/O since admission.   Currently she is feeling much better, is talkative and animated. Denies any shortness of breath is able to lie flat now. But remains on oxygen.  Allergies  Allergen Reactions  . Codeine Other (See Comments)    Reaction:  Racing heart   .  Decongestant [Pseudoephedrine Hcl Er] Other (See Comments)    Reaction:  Racing heart     Medications Scheduled Medications: . aspirin EC  81 mg Oral Daily  . atorvastatin  20 mg Oral q1800  . calcium gluconate  500 mg Oral BID  . carvedilol  3.125 mg Oral BID WC  . furosemide  40 mg Oral BID  . heparin  5,000 Units Subcutaneous Q8H  . insulin aspart  0-15 Units Subcutaneous TID WC  . insulin aspart  0-5 Units Subcutaneous QHS  . levothyroxine  75 mcg Oral QAC breakfast  . linagliptin  5 mg Oral Daily  . losartan  50 mg Oral Daily  . omega-3 acid ethyl esters  1 capsule Oral TID  . pantoprazole  40 mg Oral Daily  . potassium chloride  20 mEq Oral BID  . spironolactone  25 mg Oral Daily        PRN Medications:  acetaminophen   Past Medical History:  Diagnosis Date  . AICD (automatic cardioverter/defibrillator) present   . Arthritis    "fingers" (11/18/2014)  . Atrial flutter (HCC)    Versus ventricular tachycardia; treated with amiodarone  . Cardiomyopathy, nonischemic ([place])    Nl cors in [place]; CHF in 12/97; EF of 40% in 5/01 and 7/03, 25% in 9/04 and 4/08.  systolic murmur without valvular abnormalities by echo; refused automatic implantable cardiac defibrillator  . Cerebrovascular disease    Duplex study in 12/2006 shows atherosclerosis without focal stenosis  . CHF (congestive heart failure) ([place])   . Diabetes mellitus, type II (  [place])    No insulin  . Gastroesophageal reflux disease   . Hyperlipidemia   . Hypertension   . Left bundle branch block   . Ventricular tachycardia ([place])    a. PMH it lists "atrial flutter versus ventricular tachycardia treated with amiodarone" - details of this are unclear. Notes from back to 2006 indicate she has been maintained on amiodarone but do not describe further indication. Patient's [name] reports pt was told she had skipped beats that could cause Donna to drop dead - Dr. Lattie Haw put Donna on amiodarone and recommended a  defibrillator.    Past Surgical History:  Procedure Laterality Date  . ABDOMINAL HYSTERECTOMY  2006  . BI-VENTRICULAR IMPLANTABLE CARDIOVERTER DEFIBRILLATOR  (CRT-D)  11/18/2014  . COLONOSCOPY  2008  . EP IMPLANTABLE DEVICE N/A 11/18/2014   Procedure: BiV ICD Insertion CRT-D;  Surgeon: Evans Lance, MD;  Location: [facility];  Service: Cardiovascular;  Laterality: N/A;  . HERNIA REPAIR Right   . TONSILLECTOMY      Family History  Problem Relation Age of Onset  . Diabetes Mother   . Kidney disease Mother   . Diabetes [name]   . Heart attack Brother     Social History Donna [name] reports that she has never smoked. She has never used smokeless tobacco. Donna [name] reports that she does not drink alcohol.  Review of Systems Complete review of systems are found to be negative unless outlined in H&P above.  Physical Examination Blood pressure (!) 123/59, pulse 65, temperature 97.5 F (36.4 C), temperature source Oral, resp. rate (!) 27, height 5\' 1"  (1.549 m), weight 123 lb 11.2 oz (56.1 kg), SpO2 97 %.  Intake/Output Summary (Last 24 hours) at 12/29/15 0829 Last data filed at 12/29/15 0524  Gross per 24 hour  Intake                0 ml  Output             2000 ml  Net            -2000 ml    Telemetry:Paced rhythm  GEN: No acute distress HEENT: Conjunctiva and lids normal, oropharynx clear with moist mucosa. Neck: Supple, no elevated JVP or carotid bruits, no thyromegaly. Lungs: Bibasilar crackles right greater than left not cleared with cough. No wheezes or rhonchi Cardiac: Regular rate and rhythm, distant heart sounds no S3 or significant systolic murmur, no pericardial rub. Abdomen: Soft, nontender, a little fullness. No distention no hepatomegaly, bowel sounds present, no guarding or rebound. Extremities: No pitting edema, distal pulses 2+. Skin: Warm and dry. Musculoskeletal: No kyphosis. Neuropsychiatric: Alert and oriented x3, affect grossly  appropriate.  Prior Cardiac Testing/Procedures 1. Echocardiogram 10/08/2014 Left ventricle: There is global hypokinesis with akinesis of the   anterior, inferior walls and paradoxical septal motion. Overall   LVEF is severely refused estimated at 15-20%. The cavity size was   mildly dilated. There was moderate concentric hypertrophy.   Systolic function was severely reduced. The estimated ejection   fraction was in the range of 15-20%. Features are consistent with   a pseudonormal left ventricular filling pattern, with concomitant   abnormal relaxation and increased filling pressure (grade 2   diastolic dysfunction). Doppler parameters are consistent with   elevated ventricular end-diastolic filling pressure. - Ventricular septum: Septal motion showed paradox. - Aortic valve: Trileaflet; normal thickness leaflets. There was no   regurgitation. - Aortic root: The aortic root was normal in size. -  Mitral valve: Structurally normal valve. There was moderate   regurgitation. - Left atrium: The atrium was normal in size. - Right ventricle: Systolic function was mildly reduced. - Right atrium: The atrium was normal in size. - Tricuspid valve: There was mild regurgitation. - Pulmonary arteries: Systolic pressure was within the normal   range. PA peak pressure: 34 mm Hg (S). - Inferior vena cava: The vessel was normal in size. - Pericardium, extracardiac: A trivial pericardial effusion was   identified.  2. ICD Im[employer]ant St. Jude 11/18/2014 1. Biventricular ICD implantation.  2. Defibrillation threshold testing 3. Venography of the coronary sinus 4. Venography of the left upper extremity 5. ICD DFT testing  Lab Results  Basic Metabolic Panel:  Recent Labs Lab 12/29/15 0116 12/29/15 0529  NA 134* 137  K 3.6 3.5  CL 100* 99*  CO2 26 30  GLUCOSE 217* 154*  BUN 26* 25*  CREATININE 0.79 0.72  CALCIUM 8.8* 8.9    Liver Function Tests:  Recent Labs Lab 12/29/15 0116  AST 21   ALT 18  ALKPHOS 45  BILITOT 0.7  PROT 7.0  ALBUMIN 4.1    CBC:  Recent Labs Lab 12/29/15 0116 12/29/15 0529  WBC 9.8 8.4  NEUTROABS 7.9*  --   HGB 12.0 12.5  HCT 36.1 37.7  MCV 97.3 97.7  PLT 154 160    Cardiac Enzymes:  Recent Labs Lab 12/29/15 0116 12/29/15 0529  TROPONINI 0.05* 0.06*   Echocardiogram 10/18/2014 Left ventricle: There is global hypokinesis with akinesis of the   anterior, inferior walls and paradoxical septal motion. Overall   LVEF is severely refused estimated at 15-20%. The cavity size was   mildly dilated. There was moderate concentric hypertrophy.   Systolic function was severely reduced. The estimated ejection   fraction was in the range of 15-20%. Features are consistent with   a pseudonormal left ventricular filling pattern, with concomitant   abnormal relaxation and increased filling pressure (grade 2   diastolic dysfunction). Doppler parameters are consistent with   elevated ventricular end-diastolic filling pressure. - Ventricular septum: Septal motion showed paradox. - Aortic valve: Trileaflet; normal thickness leaflets. There was no   regurgitation. - Aortic root: The aortic root was normal in size. - Mitral valve: Structurally normal valve. There was moderate   regurgitation. - Left atrium: The atrium was normal in size. - Right ventricle: Systolic function was mildly reduced. - Right atrium: The atrium was normal in size. - Tricuspid valve: There was mild regurgitation. - Pulmonary arteries: Systolic pressure was within the normal   range. PA peak pressure: 34 mm Hg (S). - Inferior vena cava: The vessel was normal in size. - Pericardium, extracardiac: A trivial pericardial effusion was   identified.  ICD interrogation 12/14/2015 CRT-D device check in office. Thresholds and sensing consistent with previous device measurements. Lead impedance trends stable over time. No mode switch episodes recorded. No ventricular arrhythmia  episodes recorded. Patient bi-ventricularly pacing 98%  of the time. Device programmed with appropriate safety margins. Heart failure diagnostics reviewed and trends are stable for patient. No changes made this session. Estimated longevity 5.9-6.2 years. Patient will follow up via [facility] on 10/9 and with  GT/R in 12 months.[facility]  Radiology: Dg Chest Port 1 View  Result Date: 12/29/2015 CLINICAL DATA:  Initial evaluation for acute shortness of breath for 1 day. History of CHF. EXAM: PORTABLE CHEST 1 VIEW COMPARISON:  Prior radiograph from 05/16/2015. FINDINGS: Patient is slightly rotated to the right. Left-sided  transvenous pacemaker/ AICD in place. Cardiomegaly stable. Mediastinal silhouette within normal limits. Aortic atherosclerosis noted. Lungs normally inflated. Diffuse vascular congestion with indistinctness of the interstitial markings, most consistent with pulmonary edema. More confluent opacity within the right lower lobe favored to reflect edema and/or summation of shadows related to patient rotation. Superimposed infection could be considered in the correct clinical setting. No definite pleural effusion. No pneumothorax. No acute osseous abnormality. IMPRESSION: 1. Cardiomegaly with mild to moderate diffuse pulmonary edema, consistent with acute CHF exacerbation. Superimposed infection could be considered in the correct clinical setting. 2. Aortic atherosclerosis. Electronically Signed   By: Jeannine Boga M.D.   On: 12/29/2015 01:03    ECG: AV pacing.   Impression and Recommendations 1. Acute on Chronic Systolic CHF with pulmonary edema: Likely from dietary non-compliance Has diuresed 2 liters since admission. Creatinine 0.72, CO2 30. Will need another day of IV lasix On last office visit in 12/14/2015 wt was 123 lbs. This is consistent with weight in hospital this am.  May need lower dry wt. Also consider changing lasix to torsemide for better bioavailability. Repeat BMET  in am. Consider Entresto if BP tolerates. Currently stable on coreg 3.125 mg BID and spironolactone 25 mg daily.Taken off of ACE due to hypotension.   2. NICM: EF of 25%-20%: On appropriate medications. No ACE due to hypotension in the past.   3. ICD in situ: St. Jude. Interrogated December 14, 2015. As above. Continue interrogation per protocol.   4. Hypothyroidism:TSH normal    Signed: Phill Myron. Lawrence NP [facility]  12/29/2015, 8:29 AM Co-Sign MD  The patient was seen and examined, and I agree with the history, physical exam, assessment and plan as documented above which has been discussed with Arnold Long, with modifications as noted below.  Pt feeling better. Admitted with acute on chronic systolic heart failure due to dietary indiscretion. Taking Lasix 40 mg bid at home, along with Coreg, losartan, and spironolactone. Has been stable for some time. I encouraged Donna to take an extra 40 mg for increasing SOB and/or leg swelling. Encouraged Donna to avoid sodium-laden foods.  Would have Donna fu with Dr. Harl Bowie in August. Will defer decision regarding torsemide to him.  Kate Sable, MD, [facility]  12/29/2015 9:23 AM

## 2015-12-29 NOTE — ED Triage Notes (Signed)
Pt brought in by [facility] for c/o sob that started today; pt denies any cough; pt's abd is distended; pt arrived on bi-pap machine

## 2015-12-29 NOTE — Progress Notes (Signed)
Notified MD, critical value for troponins of 0.06. No new orders given at this time.

## 2015-12-29 NOTE — ED Notes (Signed)
CRITICAL VALUE ALERT  Critical value received: Troponin 0.05 Date of notification: 12/29/15 0210  Critical value read back:Yes.    Nurse who received alert: GMP MD notified (1st page):  Dr Tomi Bamberger Time of first page: 0210 Responding MD:  Dr Tomi Bamberger  Time MD responded:  [phone]

## 2015-12-29 NOTE — ED Notes (Signed)
Per orders of Dr Marin Comment, BIPAP discontinued and pt placed on O2 @ 2L/hr. Will continue to monitor.

## 2015-12-29 NOTE — Care Management Note (Signed)
Case Management Note  Patient Details  Name: Donna [name] MRN: [id number] Date of Birth: [birth year]  Subjective/Objective: Patient adm from home with CHF, lives with grandson. Patient is ind with ADL's. Dr. Luan Pulling is her PCP and she reports no issues with obtaining medications.                    Action/Plan: [place] with self care. Will need 02 assessment prior to discharge.    Expected Discharge Date:                  Expected Discharge Plan:  Home/Self Care  In-House Referral:  NA  Discharge planning Services  CM Consult  Post Acute Care Choice:  NA Choice offered to:  NA  DME Arranged:    DME Agency:     HH Arranged:    HH Agency:     Status of Service:  Completed, signed off  If discussed at [employer] of Stay Meetings, dates discussed:    Additional Comments:  Jacquita Mulhearn, Chauncey Reading, RN 12/29/2015, 2:04 PM

## 2015-12-29 NOTE — Progress Notes (Signed)
Subjective: She was brought in for observation this morning after developing another episode of pulmonary edema. She has been weighing herself daily and says there has been no change but she is not using a digital scale. She has not had any significant changes in her diet. She did have low [reduced from 100 mg to 50 mg when she saw her electrophysiologist about 2 weeks ago. Her blood pressure had been somewhat low and she was complaining of dizziness at that time. She has diuresed extensively and feels much better. She has no complaints now. No chest pain. She did not have any swelling of her legs.  Objective: Vital signs in last 24 hours: Temp:  [97.5 F (36.4 C)-97.7 F (36.5 C)] 97.5 F (36.4 C) (07/25 0516) Pulse Rate:  [58-86] 66 (07/25 0830) Resp:  [11-29] 27 (07/25 0516) BP: (97-141)/(56-95) 123/59 (07/25 0516) SpO2:  [96 %-100 %] 97 % (07/25 0516) Weight:  [50.8 kg (112 lb)-56.1 kg (123 lb 11.2 oz)] 56.1 kg (123 lb 11.2 oz) (07/25 0516) Weight change:  Last BM Date: 12/27/15  Intake/Output from previous day: 07/24 0701 - 07/25 0700 In: -  Out: 2000 [Urine:2000]  PHYSICAL EXAM General appearance: alert, cooperative and no distress Resp: clear to auscultation bilaterally Cardio: Her heart is regular and I do not hear a gallop GI: soft, non-tender; bowel sounds normal; no masses,  no organomegaly Extremities: extremities normal, atraumatic, no cyanosis or edema  Lab Results:  Results for orders placed or performed during the hospital encounter of 12/29/15 (from the past 48 hour(s))  Urinalysis, Routine w reflex microscopic (not at [facility])     Status: Abnormal   Collection Time: 12/29/15  1:05 AM  Result Value Ref Range   Color, Urine YELLOW YELLOW   APPearance CLEAR CLEAR   Specific Gravity, Urine 1.015 1.005 - 1.030   pH 5.5 5.0 - 8.0   Glucose, UA NEGATIVE NEGATIVE mg/dL   Hgb urine dipstick TRACE (A) NEGATIVE   Bilirubin Urine NEGATIVE NEGATIVE   Ketones, ur NEGATIVE  NEGATIVE mg/dL   Protein, ur NEGATIVE NEGATIVE mg/dL   Nitrite NEGATIVE NEGATIVE   Leukocytes, UA NEGATIVE NEGATIVE  Urine microscopic-add on     Status: Abnormal   Collection Time: 12/29/15  1:05 AM  Result Value Ref Range   Squamous Epithelial / LPF 0-5 (A) NONE SEEN   WBC, UA 0-5 0 - 5 WBC/hpf   RBC / HPF 0-5 0 - 5 RBC/hpf   Bacteria, UA MANY (A) NONE SEEN   Casts HYALINE CASTS (A) NEGATIVE   Urine-Other MUCOUS PRESENT   Brain natriuretic peptide     Status: Abnormal   Collection Time: 12/29/15  1:16 AM  Result Value Ref Range   B Natriuretic Peptide 872.0 (H) 0.0 - 100.0 pg/mL  Comprehensive metabolic panel     Status: Abnormal   Collection Time: 12/29/15  1:16 AM  Result Value Ref Range   Sodium 134 (L) 135 - 145 mmol/L   Potassium 3.6 3.5 - 5.1 mmol/L   Chloride 100 (L) 101 - 111 mmol/L   CO2 26 22 - 32 mmol/L   Glucose, Bld 217 (H) 65 - 99 mg/dL   BUN 26 (H) 6 - 20 mg/dL   Creatinine, Ser 0.79 0.44 - 1.00 mg/dL   Calcium 8.8 (L) 8.9 - 10.3 mg/dL   Total Protein 7.0 6.5 - 8.1 g/dL   Albumin 4.1 3.5 - 5.0 g/dL   AST 21 15 - 41 U/L   ALT 18 14 -  54 U/L   Alkaline Phosphatase 45 38 - 126 U/L   Total Bilirubin 0.7 0.3 - 1.2 mg/dL   GFR calc non Af Amer >60 >60 mL/min   GFR calc Af Amer >60 >60 mL/min    Comment: (NOTE) The eGFR has been calculated using the CKD EPI equation. This calculation has not been validated in all clinical situations. eGFR's persistently <60 mL/min signify possible Chronic Kidney Disease.    Anion gap 8 5 - 15  CBC with Differential     Status: Abnormal   Collection Time: 12/29/15  1:16 AM  Result Value Ref Range   WBC 9.8 4.0 - 10.5 K/uL   RBC 3.71 (L) 3.87 - 5.11 MIL/uL   Hemoglobin 12.0 12.0 - 15.0 g/dL   HCT 36.1 36.0 - 46.0 %   MCV 97.3 78.0 - 100.0 fL   MCH 32.3 26.0 - 34.0 pg   MCHC 33.2 30.0 - 36.0 g/dL   RDW 12.9 11.5 - 15.5 %   Platelets 154 150 - 400 K/uL   Neutrophils Relative % 81 %   Neutro Abs 7.9 (H) 1.7 - 7.7 K/uL    Lymphocytes Relative 13 %   Lymphs Abs 1.3 0.7 - 4.0 K/uL   Monocytes Relative 5 %   Monocytes Absolute 0.5 0.1 - 1.0 K/uL   Eosinophils Relative 1 %   Eosinophils Absolute 0.1 0.0 - 0.7 K/uL   Basophils Relative 0 %   Basophils Absolute 0.0 0.0 - 0.1 K/uL  Troponin I     Status: Abnormal   Collection Time: 12/29/15  1:16 AM  Result Value Ref Range   Troponin I 0.05 (HH) <0.03 ng/mL    Comment: CRITICAL RESULT CALLED TO, READ BACK BY AND VERIFIED WITH: PRUITT G AT 0209 ON [id number] BY FORSYTH K   Blood gas, arterial ([facility] & [facility] ONLY)     Status: Abnormal   Collection Time: 12/29/15  2:30 AM  Result Value Ref Range   FIO2 50.00    Delivery systems BILEVEL POSITIVE AIRWAY PRESSURE    Inspiratory PAP 12.0    Expiratory PAP 6.0    pH, Arterial 7.415 7.350 - 7.450   pCO2 arterial 43.5 35.0 - 45.0 mmHg   pO2, Arterial 140.0 (H) 80.0 - 100.0 mmHg   Bicarbonate 27.0 (H) 20.0 - 24.0 mEq/L   TCO2 16.9 0 - 100 mmol/L   Acid-Base Excess 3.1 (H) 0.0 - 2.0 mmol/L   O2 Saturation 98.8 %   Patient temperature 37.0    Collection site RIGHT RADIAL    Drawn by 254,270    Sample type ARTERIAL    Allens test (pass/fail) PASS PASS  Glucose, capillary     Status: Abnormal   Collection Time: 12/29/15  5:12 AM  Result Value Ref Range   Glucose-Capillary 153 (H) 65 - 99 mg/dL  CBC     Status: Abnormal   Collection Time: 12/29/15  5:29 AM  Result Value Ref Range   WBC 8.4 4.0 - 10.5 K/uL   RBC 3.86 (L) 3.87 - 5.11 MIL/uL   Hemoglobin 12.5 12.0 - 15.0 g/dL   HCT 37.7 36.0 - 46.0 %   MCV 97.7 78.0 - 100.0 fL   MCH 32.4 26.0 - 34.0 pg   MCHC 33.2 30.0 - 36.0 g/dL   RDW 13.0 11.5 - 15.5 %   Platelets 160 150 - 400 K/uL  Troponin I     Status: Abnormal   Collection Time: 12/29/15  5:29 AM  Result Value  Ref Range   Troponin I 0.06 (HH) <0.03 ng/mL    Comment: CRITICAL RESULT CALLED TO, READ BACK BY AND VERIFIED WITH: HAMILTON,S AT 6:30AM ON 12/29/15 BY [facility]   Basic metabolic panel      Status: Abnormal   Collection Time: 12/29/15  5:29 AM  Result Value Ref Range   Sodium 137 135 - 145 mmol/L   Potassium 3.5 3.5 - 5.1 mmol/L   Chloride 99 (L) 101 - 111 mmol/L   CO2 30 22 - 32 mmol/L   Glucose, Bld 154 (H) 65 - 99 mg/dL   BUN 25 (H) 6 - 20 mg/dL   Creatinine, Ser 0.72 0.44 - 1.00 mg/dL   Calcium 8.9 8.9 - 10.3 mg/dL   GFR calc non Af Amer >60 >60 mL/min   GFR calc Af Amer >60 >60 mL/min    Comment: (NOTE) The eGFR has been calculated using the CKD EPI equation. This calculation has not been validated in all clinical situations. eGFR's persistently <60 mL/min signify possible Chronic Kidney Disease.    Anion gap 8 5 - 15  TSH     Status: None   Collection Time: 12/29/15  5:42 AM  Result Value Ref Range   TSH 3.009 0.350 - 4.500 uIU/mL  Glucose, capillary     Status: Abnormal   Collection Time: 12/29/15  7:30 AM  Result Value Ref Range   Glucose-Capillary 142 (H) 65 - 99 mg/dL    ABGS  Recent Labs  12/29/15 0230  PHART 7.415  PO2ART 140.0*  TCO2 16.9  HCO3 27.0*   CULTURES No results found for this or any previous visit (from the past 240 hour(s)). Studies/Results: Dg Chest Port 1 View  Result Date: 12/29/2015 CLINICAL DATA:  Initial evaluation for acute shortness of breath for 1 day. History of CHF. EXAM: PORTABLE CHEST 1 VIEW COMPARISON:  Prior radiograph from 05/16/2015. FINDINGS: Patient is slightly rotated to the right. Left-sided transvenous pacemaker/ AICD in place. Cardiomegaly stable. Mediastinal silhouette within normal limits. Aortic atherosclerosis noted. Lungs normally inflated. Diffuse vascular congestion with indistinctness of the interstitial markings, most consistent with pulmonary edema. More confluent opacity within the right lower lobe favored to reflect edema and/or summation of shadows related to patient rotation. Superimposed infection could be considered in the correct clinical setting. No definite pleural effusion. No pneumothorax.  No acute osseous abnormality. IMPRESSION: 1. Cardiomegaly with mild to moderate diffuse pulmonary edema, consistent with acute CHF exacerbation. Superimposed infection could be considered in the correct clinical setting. 2. Aortic atherosclerosis. Electronically Signed   By: Jeannine Boga M.D.   On: 12/29/2015 01:03   Medications:  Prior to Admission:  Prescriptions Prior to Admission  Medication Sig Dispense Refill Last Dose  . acetaminophen (TYLENOL) 500 MG tablet Take 500 mg by mouth every 6 (six) hours as needed.   Taking  . aspirin 81 MG tablet Take 81 mg by mouth daily.   Taking  . atorvastatin (LIPITOR) 20 MG tablet Take 1 tablet (20 mg total) by mouth daily at 6 PM. 30 tablet 12 Taking  . calcium gluconate 500 MG tablet Take 500 mg by mouth 2 (two) times daily.     Taking  . carvedilol (COREG) 3.125 MG tablet TAKE ONE TABLET TWICE DAILY 60 tablet 11 Taking  . fish oil-omega-3 fatty acids 1000 MG capsule Take 1 capsule by mouth 3 (three) times daily.     Taking  . furosemide (LASIX) 40 MG tablet Take 40 mg by mouth 2 (two) times  daily. Reported on 08/26/2015   Taking  . insulin detemir (LEVEMIR) 100 UNIT/ML injection Inject 12 Units into the skin at bedtime.   Taking  . levothyroxine (SYNTHROID, LEVOTHROID) 75 MCG tablet Take 75 mcg by mouth daily before breakfast.   Taking  . linagliptin (TRADJENTA) 5 MG TABS tablet Take 5 mg by mouth daily.   Taking  . losartan (COZAAR) 50 MG tablet Take 1 tablet (50 mg total) by mouth daily. 90 tablet 3   . omeprazole (PRILOSEC) 20 MG capsule Take 20 mg by mouth 2 (two) times daily.     Taking  . potassium chloride (KLOR-CON) 20 MEQ packet Take 20 mEq by mouth 2 (two) times daily. 60 tablet 1 Taking  . spironolactone (ALDACTONE) 25 MG tablet Take 25 mg by mouth daily.     Taking   Scheduled: . aspirin EC  81 mg Oral Daily  . atorvastatin  20 mg Oral q1800  . calcium gluconate  500 mg Oral BID  . carvedilol  3.125 mg Oral BID WC  .  furosemide  40 mg Oral BID  . heparin  5,000 Units Subcutaneous Q8H  . insulin aspart  0-15 Units Subcutaneous TID WC  . insulin aspart  0-5 Units Subcutaneous QHS  . levothyroxine  75 mcg Oral QAC breakfast  . linagliptin  5 mg Oral Daily  . losartan  50 mg Oral Daily  . omega-3 acid ethyl esters  1 capsule Oral TID  . pantoprazole  40 mg Oral Daily  . potassium chloride  20 mEq Oral BID  . spironolactone  25 mg Oral Daily   Continuous:  IPP:[id number]  Assesment: She was admitted with acute on chronic systolic heart failure. She is much improved. She is known to have cardiomyopathy and has a defibrillator in place. She had acute hypoxic respiratory failure and briefly required BiPAP but once she diuresed she is back to her normal situation. She says she feels back to baseline now. Principal Problem:   Acute on chronic systolic (congestive) heart failure (HCC) Active Problems:   VENTRICULAR TACHYCARDIA   Diabetes mellitus, type II ([place])   Acute respiratory failure with hypoxia (HCC)   Acute on chronic systolic CHF (congestive heart failure) ([place])    Plan: I'm going to request cardiology consultation to see if we need to change any of her treatments at this point. She may be able to go home later today.    LOS: 0 days   Hudson Majkowski L 12/29/2015, 8:37 AM

## 2015-12-29 NOTE — H&P (Signed)
[facility] History and Physical  Donna [name] [id number] DOB: 01-Aug-1940    PCP:   Alonza Bogus, MD   Chief Complaint: SOB today.   HPI:  She is a pleasant 75 yo woman with chronic systolic heart, LBBB, HTN, and a wide QRS tachycardia for which she chronically been on amiodarone in low dose, with subsequent AICD, no longer on Amiodarone, doing well and saw Dr Lovena  on July 10, when he reduced her Losartan as her BP was soft and she felt occasional lightheadedness, lives at home with her son, presented to the ER with SOB and orthopnea.  She was found to have CXR with mild to moderate CHF, pulmonary edema, with slight elevation of her BNP to 800's, and normal renal fx test along with normal K.  She was placed on Bipap as she was hypoxic with Sat in the 75%, and given NTP and Lasix.  She diuresed immediately an amount of 800cc, and felt markedly better, able to come off her bipap to 2 L [place] with 99 percent Sat.  Her EKG showed paced rhythm, and her troponin was unremarkable at 0.05.  Hopitalist was asked to admit her for further monitoring of her acute on chronic systolic CHF.  She had no evidence of infection, or ACS.    Rewiew of Systems:  Constitutional: Negative for malaise, fever and chills. No significant weight loss or weight gain Eyes: Negative for eye pain, redness and discharge, diplopia, visual changes, or flashes of light. ENMT: Negative for ear pain, hoarseness, nasal congestion, sinus pressure and sore throat. No headaches; tinnitus, drooling, or problem swallowing. Cardiovascular: Negative for chest pain, palpitations, diaphoresis, dyspnea and peripheral edema. ; No orthopnea, PND Respiratory: Negative for cough, hemoptysis, wheezing and stridor. No pleuritic chestpain. Gastrointestinal: Negative for nausea, vomiting, diarrhea, constipation, abdominal pain, melena, blood in stool, hematemesis, jaundice and rectal bleeding.    Genitourinary: Negative for frequency,  dysuria, incontinence,flank pain and hematuria; Musculoskeletal: Negative for back pain and neck pain. Negative for swelling and trauma.;  Skin: . Negative for pruritus, rash, abrasions, bruising and skin lesion.; ulcerations Neuro: Negative for headache, lightheadedness and neck stiffness. Negative for weakness, altered level of consciousness , altered mental status, extremity weakness, burning feet, involuntary movement, seizure and syncope.  Psych: negative for anxiety, depression, insomnia, tearfulness, panic attacks, hallucinations, paranoia, suicidal or homicidal ideation   Past Medical History:  Diagnosis Date  . AICD (automatic cardioverter/defibrillator) present   . Arthritis    "fingers" (11/18/2014)  . Atrial flutter (HCC)    Versus ventricular tachycardia; treated with amiodarone  . Cardiomyopathy, nonischemic ([place])    Nl cors in [place]; CHF in 12/97; EF of 40% in 5/01 and 7/03, 25% in 9/04 and 4/08.  systolic murmur without valvular abnormalities by echo; refused automatic implantable cardiac defibrillator  . Cerebrovascular disease    Duplex study in 12/2006 shows atherosclerosis without focal stenosis  . CHF (congestive heart failure) ([place])   . Diabetes mellitus, type II ([place])    No insulin  . Gastroesophageal reflux disease   . Hyperlipidemia   . Hypertension   . ft bundle branch block   . Ventricular tachycardia ([place])    a. PMH it lists "atrial flutter versus ventricular tachycardia treated with amiodarone" - details of this are unclear. Notes from back to 2006 indicate she has been maintained on amiodarone but do not describe further indication. Patient's sister reports pt was told she had skipped beats that could cause her to drop dead -  Dr. Lattie Haw put her on amiodarone and recommended a defibrillator.    Past Surgical History:  Procedure Laterality Date  . ABDOMINAL HYSTERECTOMY  2006  . BI-VENTRICULAR IMPLANTABLE CARDIOVERTER DEFIBRILLATOR  (CRT-D)   11/18/2014  . COLONOSCOPY  2008  . EP IMPLANTABLE DEVICE N/A 11/18/2014   Procedure: BiV ICD Insertion CRT-D;  Surgeon: Evans Lance, MD;  Location: [facility];  Service: Cardiovascular;  Laterality: N/A;  . HERNIA REPAIR Right   . TONSILLECTOMY      Medications:  HOME MEDS: Prior to Admission medications   Medication Sig Start Date End Date Taking? Authorizing Provider  acetaminophen (TYLENOL) 500 MG tablet Take 500 mg by mouth every 6 (six) hours as needed.    Historical Provider, MD  aspirin 81 MG tablet Take 81 mg by mouth daily.    Historical Provider, MD  atorvastatin (LIPITOR) 20 MG tablet Take 1 tablet (20 mg total) by mouth daily at 6 PM. 07/24/14   Sinda Du, MD  calcium gluconate 500 MG tablet Take 500 mg by mouth 2 (two) times daily.      Historical Provider, MD  carvedilol (COREG) 3.125 MG tablet TAKE ONE TABLET TWICE DAILY 05/15/15   Evans Lance, MD  fish oil-omega-3 fatty acids 1000 MG capsule Take 1 capsule by mouth 3 (three) times daily.      Historical Provider, MD  furosemide (LASIX) 40 MG tablet Take 40 mg by mouth 2 (two) times daily. Reported on 08/26/2015    Historical Provider, MD  insulin detemir (LEVEMIR) 100 UNIT/ML injection Inject 12 Units into the skin at bedtime.    Sinda Du, MD  levothyroxine (SYNTHROID, LEVOTHROID) 75 MCG tablet Take 75 mcg by mouth daily before breakfast.    Historical Provider, MD  linagliptin (TRADJENTA) 5 MG TABS tablet Take 5 mg by mouth daily.    Historical Provider, MD  losartan (COZAAR) 50 MG tablet Take 1 tablet (50 mg total) by mouth daily. 12/14/15   Evans Lance, MD  omeprazole (PRILOSEC) 20 MG capsule Take 20 mg by mouth 2 (two) times daily.      Historical Provider, MD  potassium chloride (KLOR-CON) 20 MEQ packet Take 20 mEq by mouth 2 (two) times daily. 07/24/15   Lendon Colonel, NP  spironolactone (ALDACTONE) 25 MG tablet Take 25 mg by mouth daily.      Historical Provider, MD     Allergies:   Allergies  Allergen Reactions  . Codeine Other (See Comments)    Reaction:  Racing heart   . Decongestant [Pseudoephedrine Hcl Er] Other (See Comments)    Reaction:  Racing heart     Social History:   reports that she has never smoked. She has never used smokeless tobacco. She reports that she does not drink alcohol or use drugs.  Family History: Family History  Problem Relation Age of Onset  . Diabetes Mother   . Kidney disease Mother   . Diabetes Sister   . Heart attack Brother      Physical Exam: Vitals:   12/29/15 0215 12/29/15 0230 12/29/15 0245 12/29/15 0300  BP:  97/57  97/61  Pulse: (!) 58 (!) 59 (!) 59 62  Resp: 18 17 24 14   Temp:      TempSrc:      SpO2: 100% 100% 100% 100%  Weight:      Height:       Blood pressure 97/61, pulse 62, temperature 97.7 F (36.5 C), temperature source Oral, resp. rate  14, height 5\' 1"  (1.549 m), weight 50.8 kg (112 lb), SpO2 100 %.  GEN:  Pleasant patient lying in the stretcher in no acute distress; cooperative with exam. PSYCH:  alert and oriented x4; does not appear anxious or depressed; affect is appropriate. HEENT: Mucous membranes pink and anicteric; PERRLA; EOM intact; no cervical lymphadenopathy nor thyromegaly or carotid bruit; no JVD; There were no stridor. Neck is very supple. Breasts:: Not examined CHEST WALL: No tenderness CHEST: Normal respiration, clear to auscultation bilaterally.  HEART: Regular rate and rhythm.  There are no murmur, rub, or gallops.   BACK: No kyphosis or scoliosis; no CVA tenderness ABDOMEN: soft and non-tender; no masses, no organomegaly, normal abdominal bowel sounds; no pannus; no intertriginous candida. There is no rebound and no distention. Rectal Exam: Not done EXTREMITIES: No bone or joint deformity; age-appropriate arthropathy of the hands and knees; no edema; no ulcerations.  There is no calf tenderness. Genitalia: not examined PULSES: 2+ and symmetric SKIN: Normal hydration no rash  or ulceration CNS: Cranial nerves 2-12 grossly intact no focal lateralizing neurologic deficit.  Speech is fluent; uvula elevated with phonation, facial symmetry and tongue midline. DTR are normal bilaterally, cerebella exam is intact, barbinski is negative and strengths are equaled bilaterally.  No sensory loss.   Labs on Admission:  Basic Metabolic Panel:  Recent Labs Lab 12/29/15 0116  NA 134*  K 3.6  CL 100*  CO2 26  GLUCOSE 217*  BUN 26*  CREATININE 0.79  CALCIUM 8.8*   Liver Function Tests:  Recent Labs Lab 12/29/15 0116  AST 21  ALT 18  ALKPHOS 45  BILITOT 0.7  PROT 7.0  ALBUMIN 4.1   CBC:  Recent Labs Lab 12/29/15 0116  WBC 9.8  NEUTROABS 7.9*  HGB 12.0  HCT 36.1  MCV 97.3  PLT 154   Cardiac Enzymes:  Recent Labs Lab 12/29/15 0116  TROPONINI 0.05*   Radiological Exams on Admission: Dg Chest Port 1 View  Result Date: 12/29/2015 CLINICAL DATA:  Initial evaluation for acute shortness of breath for 1 day. History of CHF. EXAM: PORTABLE CHEST 1 VIEW COMPARISON:  Prior radiograph from 05/16/2015. FINDINGS: Patient is slightly rotated to the right. Left-sided transvenous pacemaker/ AICD in place. Cardiomegaly stable. Mediastinal silhouette within normal limits. Aortic atherosclerosis noted. Lungs normally inflated. Diffuse vascular congestion with indistinctness of the interstitial markings, most consistent with pulmonary edema. More confluent opacity within the right lower lobe favored to reflect edema and/or summation of shadows related to patient rotation. Superimposed infection could be considered in the correct clinical setting. No definite pleural effusion. No pneumothorax. No acute osseous abnormality. IMPRESSION: 1. Cardiomegaly with mild to moderate diffuse pulmonary edema, consistent with acute CHF exacerbation. Superimposed infection could be considered in the correct clinical setting. 2. Aortic atherosclerosis. Electronically Signed   By: Jeannine Boga M.D.   On: 12/29/2015 01:03   EKG: Independently reviewed.    Assessment/Plan  Acute on chronic systolic CHF:  She compensated quite well, and was a little volume overloaded, but had diuresed well, and is feeling better.  Will admit her for OBS in telemetry.  I have resumed her oral Lasix.  Continue with her meds.  She is stable and doesn't require nitroglycerin anymore at this time.  We will try to avoid overdiuresing her.  Her BP is a little soft at this time.  For her DM, will use her SSI.  For her hypothyroidism, will continue with supplement, and check TSH.  She is  stable full code, and will be admitted to Dr Miguel Rota service.      Other plans as per orders. Code Status: FULL Haskel Khan, MD. FACP [facility] Pager [phone] 7pm to 7am.  12/29/2015, 3:43 AM

## 2015-12-29 NOTE — Progress Notes (Signed)
CRITICAL VALUE ALERT  Critical value received:  Troponin 0.06  Date of notification:  12/29/2015  Time of notification:  06:26  Critical value read back: yes  Nurse who received alert:  Stephannie Peters  MD notified (1st page):  Dr. Marin Comment  Time of first page:  06:27  MD notified (2nd page):  Time of second page:  Responding MD:  Dr. Marin Comment  Time MD responded:  06:30

## 2015-12-29 NOTE — Progress Notes (Signed)
Pt. Ambulated around 50 feet in the hallway. Tolerated well. Oxygen was 100% after ambulation; pulse was 67.

## 2015-12-29 NOTE — Care Management Obs Status (Signed)
[facility] NOTIFICATION   Patient Details  Name: Donna [name] MRN: [id number] Date of Birth: [birth year]   Medicare Observation Status Notification Given:  Yes    Jamecia Lerman, Chauncey [place], RN 12/29/2015, 2:05 PM

## 2015-12-29 NOTE — Consult Note (Signed)
  Spoke with patient and daughter at bedside regarding the restart of services with [facility] Care Management. Patient verbalized interest in [facility] services and would love to restart services after hospitalization. Active consent on file. Patient will receive post hospital follow up calls and be assessed for home visits.  Of note, [facility] Care Management services does not replace or interfere with any services that are arranged by inpatient case management or social work.  For questions, please contact:  Royetta Crochet. Laymond Purser, RN, BSN, [place] [phone]) Business Cell  [phone]) Toll Free Office

## 2015-12-30 DIAGNOSIS — I5023 Acute on chronic systolic (congestive) heart failure: Secondary | ICD-10-CM | POA: Diagnosis not present

## 2015-12-30 DIAGNOSIS — Z9111 Patient's noncompliance with dietary regimen: Secondary | ICD-10-CM

## 2015-12-30 DIAGNOSIS — I472 Ventricular tachycardia: Secondary | ICD-10-CM | POA: Diagnosis not present

## 2015-12-30 DIAGNOSIS — J9601 Acute respiratory failure with hypoxia: Secondary | ICD-10-CM | POA: Diagnosis not present

## 2015-12-30 DIAGNOSIS — I519 Heart disease, unspecified: Secondary | ICD-10-CM

## 2015-12-30 LAB — GLUCOSE, CAPILLARY
Glucose-Capillary: 142 mg/dL — ABNORMAL HIGH (ref 65–99)
Glucose-Capillary: 162 mg/dL — ABNORMAL HIGH (ref 65–99)

## 2015-12-30 NOTE — Progress Notes (Signed)
Subjective: Donna [name] says Donna [name] feels okay. Donna [name] is not short of breath. Donna [name] tells me now that Donna [name] did eat barbecue and that may have been what set her off into another episode of pulmonary edema. Donna [name] is negative about 2-1/2 [name] now. Cardiology consult noted and appreciated.  Objective: Vital signs in last 24 hours: Temp:  [98 F (36.7 C)-98.7 F (37.1 C)] 98.7 F (37.1 C) (07/26 0500) Pulse Rate:  [62-68] 68 (07/26 0500) Resp:  [18-19] 18 (07/26 0500) BP: (102-110)/(52-62) 110/62 (07/26 0500) SpO2:  [96 %-99 %] 96 % (07/26 0500) Weight:  [53.3 kg (117 lb 8 oz)] 53.3 kg (117 lb 8 oz) (07/26 0500) Weight change: 2.495 kg (5 lb 8 oz) Last BM Date: 12/29/15  Intake/Output from previous day: 07/25 0701 - 07/26 0700 In: -  Out: 500 [Urine:500]  PHYSICAL EXAM General appearance: alert, cooperative and no distress Resp: clear to auscultation bilaterally Cardio: regular rate and rhythm, S1, S2 normal, no murmur, click, rub or gallop GI: soft, non-tender; bowel sounds normal; no masses,  no organomegaly Extremities: extremities normal, atraumatic, no cyanosis or edema  Lab Results:  Results for orders placed or performed during the hospital encounter of 12/29/15 (from the past 48 hour(s))  Urinalysis, Routine w reflex microscopic (not at [facility])     Status: Abnormal   Collection Time: 12/29/15  1:05 AM  Result Value Ref Range   Color, Urine YELLOW YELLOW   APPearance CLEAR CLEAR   Specific Gravity, Urine 1.015 1.005 - 1.030   pH 5.5 5.0 - 8.0   Glucose, UA NEGATIVE NEGATIVE mg/dL   Hgb urine dipstick TRACE (A) NEGATIVE   Bilirubin Urine NEGATIVE NEGATIVE   Ketones, ur NEGATIVE NEGATIVE mg/dL   Protein, ur NEGATIVE NEGATIVE mg/dL   Nitrite NEGATIVE NEGATIVE   Leukocytes, UA NEGATIVE NEGATIVE  Urine microscopic-add on     Status: Abnormal   Collection Time: 12/29/15  1:05 AM  Result Value Ref Range   Squamous Epithelial / LPF 0-5 (A) NONE SEEN   WBC, UA 0-5 0 - 5 WBC/hpf   RBC / HPF 0-5 0  - 5 RBC/hpf   Bacteria, UA MANY (A) NONE SEEN   Casts HYALINE CASTS (A) NEGATIVE   Urine-Other MUCOUS PRESENT   Brain natriuretic peptide     Status: Abnormal   Collection Time: 12/29/15  1:16 AM  Result Value Ref Range   B Natriuretic Peptide 872.0 (H) 0.0 - 100.0 pg/mL  Comprehensive metabolic panel     Status: Abnormal   Collection Time: 12/29/15  1:16 AM  Result Value Ref Range   Sodium 134 ([name]) 135 - 145 mmol/[name]   Potassium 3.6 3.5 - 5.1 mmol/[name]   Chloride 100 ([name]) 101 - 111 mmol/[name]   CO2 26 22 - 32 mmol/[name]   Glucose, Bld 217 (H) 65 - 99 mg/dL   BUN 26 (H) 6 - 20 mg/dL   Creatinine, Ser 0.79 0.44 - 1.00 mg/dL   Calcium 8.8 ([name]) 8.9 - 10.3 mg/dL   Total Protein 7.0 6.5 - 8.1 g/dL   Albumin 4.1 3.5 - 5.0 g/dL   AST 21 15 - 41 U/[name]   ALT 18 14 - 54 U/[name]   Alkaline Phosphatase 45 38 - 126 U/[name]   Total Bilirubin 0.7 0.3 - 1.2 mg/dL   GFR calc non Af Amer >60 >60 mL/min   GFR calc Af Amer >60 >60 mL/min    Comment: (NOTE) The eGFR has been calculated using the CKD EPI equation. This  calculation has not been validated in all clinical situations. eGFR's persistently <60 mL/min signify possible Chronic Kidney Disease.    Anion gap 8 5 - 15  CBC with Differential     Status: Abnormal   Collection Time: 12/29/15  1:16 AM  Result Value Ref Range   WBC 9.8 4.0 - 10.5 K/uL   RBC 3.71 ([name]) 3.87 - 5.11 MIL/uL   Hemoglobin 12.0 12.0 - 15.0 g/dL   HCT 36.1 36.0 - 46.0 %   MCV 97.3 78.0 - 100.0 fL   MCH 32.3 26.0 - 34.0 pg   MCHC 33.2 30.0 - 36.0 g/dL   RDW 12.9 11.5 - 15.5 %   Platelets 154 150 - 400 K/uL   Neutrophils Relative % 81 %   Neutro Abs 7.9 (H) 1.7 - 7.7 K/uL   Lymphocytes Relative 13 %   Lymphs Abs 1.3 0.7 - 4.0 K/uL   Monocytes Relative 5 %   Monocytes Absolute 0.5 0.1 - 1.0 K/uL   Eosinophils Relative 1 %   Eosinophils Absolute 0.1 0.0 - 0.7 K/uL   Basophils Relative 0 %   Basophils Absolute 0.0 0.0 - 0.1 K/uL  Troponin I     Status: Abnormal   Collection Time: 12/29/15   1:16 AM  Result Value Ref Range   Troponin I 0.05 (HH) <0.03 ng/mL    Comment: CRITICAL RESULT CALLED TO, READ BACK BY AND VERIFIED WITH: PRUITT G AT 0209 ON [id number] BY FORSYTH K   Blood gas, arterial ([facility] & [facility] ONLY)     Status: Abnormal   Collection Time: 12/29/15  2:30 AM  Result Value Ref Range   FIO2 50.00    Delivery systems BILEVEL POSITIVE AIRWAY PRESSURE    Inspiratory PAP 12.0    Expiratory PAP 6.0    pH, Arterial 7.415 7.350 - 7.450   pCO2 arterial 43.5 35.0 - 45.0 mmHg   pO2, Arterial 140.0 (H) 80.0 - 100.0 mmHg   Bicarbonate 27.0 (H) 20.0 - 24.0 mEq/[name]   TCO2 16.9 0 - 100 mmol/[name]   Acid-Base Excess 3.1 (H) 0.0 - 2.0 mmol/[name]   O2 Saturation 98.8 %   Patient temperature 37.0    Collection site RIGHT RADIAL    Drawn by [id number]    Sample type ARTERIAL    Allens test (pass/fail) PASS PASS  Glucose, capillary     Status: Abnormal   Collection Time: 12/29/15  5:12 AM  Result Value Ref Range   Glucose-Capillary 153 (H) 65 - 99 mg/dL  CBC     Status: Abnormal   Collection Time: 12/29/15  5:29 AM  Result Value Ref Range   WBC 8.4 4.0 - 10.5 K/uL   RBC 3.86 ([name]) 3.87 - 5.11 MIL/uL   Hemoglobin 12.5 12.0 - 15.0 g/dL   HCT 37.7 36.0 - 46.0 %   MCV 97.7 78.0 - 100.0 fL   MCH 32.4 26.0 - 34.0 pg   MCHC 33.2 30.0 - 36.0 g/dL   RDW 13.0 11.5 - 15.5 %   Platelets 160 150 - 400 K/uL  Troponin I     Status: Abnormal   Collection Time: 12/29/15  5:29 AM  Result Value Ref Range   Troponin I 0.06 (HH) <0.03 ng/mL    Comment: CRITICAL RESULT CALLED TO, READ BACK BY AND VERIFIED WITH: HAMILTON,S AT 6:30AM ON 12/29/15 BY [facility]   Basic metabolic panel     Status: Abnormal   Collection Time: 12/29/15  5:29 AM  Result Value Ref Range  Sodium 137 135 - 145 mmol/[name]   Potassium 3.5 3.5 - 5.1 mmol/[name]   Chloride 99 ([name]) 101 - 111 mmol/[name]   CO2 30 22 - 32 mmol/[name]   Glucose, Bld 154 (H) 65 - 99 mg/dL   BUN 25 (H) 6 - 20 mg/dL   Creatinine, Ser 0.72 0.44 - 1.00 mg/dL   Calcium 8.9 8.9 -  10.3 mg/dL   GFR calc non Af Amer >60 >60 mL/min   GFR calc Af Amer >60 >60 mL/min    Comment: (NOTE) The eGFR has been calculated using the CKD EPI equation. This calculation has not been validated in all clinical situations. eGFR's persistently <60 mL/min signify possible Chronic Kidney Disease.    Anion gap 8 5 - 15  TSH     Status: None   Collection Time: 12/29/15  5:42 AM  Result Value Ref Range   TSH 3.009 0.350 - 4.500 uIU/mL  Glucose, capillary     Status: Abnormal   Collection Time: 12/29/15  7:30 AM  Result Value Ref Range   Glucose-Capillary 142 (H) 65 - 99 mg/dL  Troponin I     Status: Abnormal   Collection Time: 12/29/15 11:25 AM  Result Value Ref Range   Troponin I 0.06 (HH) <0.03 ng/mL    Comment: CRITICAL VALUE NOTED.  VALUE IS CONSISTENT WITH PREVIOUSLY REPORTED AND CALLED VALUE.  Glucose, capillary     Status: Abnormal   Collection Time: 12/29/15 11:43 AM  Result Value Ref Range   Glucose-Capillary 146 (H) 65 - 99 mg/dL  Glucose, capillary     Status: None   Collection Time: 12/29/15  3:31 PM  Result Value Ref Range   Glucose-Capillary 92 65 - 99 mg/dL   Comment 1 Notify RN    Comment 2 Document in Chart   Troponin I     Status: Abnormal   Collection Time: 12/29/15  4:57 PM  Result Value Ref Range   Troponin I 0.05 (HH) <0.03 ng/mL    Comment: CRITICAL VALUE NOTED.  VALUE IS CONSISTENT WITH PREVIOUSLY REPORTED AND CALLED VALUE.  Glucose, capillary     Status: Abnormal   Collection Time: 12/29/15  8:16 PM  Result Value Ref Range   Glucose-Capillary 151 (H) 65 - 99 mg/dL  Glucose, capillary     Status: Abnormal   Collection Time: 12/30/15  7:42 AM  Result Value Ref Range   Glucose-Capillary 142 (H) 65 - 99 mg/dL    ABGS  Recent Labs  12/29/15 0230  PHART 7.415  PO2ART 140.0*  TCO2 16.9  HCO3 27.0*   CULTURES No results found for this or any previous visit (from the past 240 hour(s)). Studies/Results: Dg Chest Port 1 View  Result Date:  12/29/2015 CLINICAL DATA:  Initial evaluation for acute shortness of breath for 1 day. History of CHF. EXAM: PORTABLE CHEST 1 VIEW COMPARISON:  Prior radiograph from 05/16/2015. FINDINGS: Patient is slightly rotated to the right. Left-sided transvenous pacemaker/ AICD in place. Cardiomegaly stable. Mediastinal silhouette within normal limits. Aortic atherosclerosis noted. Lungs normally inflated. Diffuse vascular congestion with indistinctness of the interstitial markings, most consistent with pulmonary edema. More confluent opacity within the right lower lobe favored to reflect edema and/or summation of shadows related to patient rotation. Superimposed infection could be considered in the correct clinical setting. No definite pleural effusion. No pneumothorax. No acute osseous abnormality. IMPRESSION: 1. Cardiomegaly with mild to moderate diffuse pulmonary edema, consistent with acute CHF exacerbation. Superimposed infection could be considered in the  correct clinical setting. 2. Aortic atherosclerosis. Electronically Signed   By: Jeannine Boga M.D.   On: 12/29/2015 01:03   Medications:  Prior to Admission:  Prescriptions Prior to Admission  Medication Sig Dispense Refill Last Dose  . acetaminophen (TYLENOL) 500 MG tablet Take 500 mg by mouth every 6 (six) hours as needed.   Past Week at Unknown time  . aspirin 81 MG tablet Take 81 mg by mouth daily.   12/28/2015 at Unknown time  . atorvastatin (LIPITOR) 20 MG tablet Take 1 tablet (20 mg total) by mouth daily at 6 PM. 30 tablet 12 12/28/2015 at Unknown time  . calcium gluconate 500 MG tablet Take 500 mg by mouth 2 (two) times daily.     12/28/2015 at Unknown time  . carvedilol (COREG) 3.125 MG tablet TAKE ONE TABLET TWICE DAILY 60 tablet 11 12/28/2015 at Unknown time  . fish oil-omega-3 fatty acids 1000 MG capsule Take 1 capsule by mouth 3 (three) times daily.     12/28/2015 at Unknown time  . furosemide (LASIX) 40 MG tablet Take 40 mg by mouth 2  (two) times daily. Reported on 08/26/2015   12/28/2015 at Unknown time  . insulin detemir (LEVEMIR) 100 UNIT/ML injection Inject 12 Units into the skin at bedtime.   12/28/2015 at Unknown time  . levothyroxine (SYNTHROID, LEVOTHROID) 75 MCG tablet Take 75 mcg by mouth daily before breakfast.   12/28/2015 at Unknown time  . linagliptin (TRADJENTA) 5 MG TABS tablet Take 5 mg by mouth daily.   12/28/2015 at Unknown time  . losartan (COZAAR) 50 MG tablet Take 1 tablet (50 mg total) by mouth daily. 90 tablet 3 12/28/2015 at Unknown time  . omeprazole (PRILOSEC) 20 MG capsule Take 20 mg by mouth 2 (two) times daily.     12/28/2015 at Unknown time  . potassium chloride (KLOR-CON) 20 MEQ packet Take 20 mEq by mouth 2 (two) times daily. 60 tablet 1 12/28/2015 at Unknown time  . spironolactone (ALDACTONE) 25 MG tablet Take 25 mg by mouth daily.     12/28/2015 at Unknown time   Scheduled: . aspirin EC  81 mg Oral Daily  . atorvastatin  20 mg Oral q1800  . calcium carbonate  1 tablet Oral BID  . carvedilol  3.125 mg Oral BID WC  . furosemide  40 mg Oral BID  . heparin  5,000 Units Subcutaneous Q8H  . insulin aspart  0-15 Units Subcutaneous TID WC  . insulin aspart  0-5 Units Subcutaneous QHS  . levothyroxine  75 mcg Oral QAC breakfast  . linagliptin  5 mg Oral Daily  . losartan  50 mg Oral Daily  . omega-3 acid ethyl esters  1 capsule Oral TID  . pantoprazole  40 mg Oral Daily  . potassium chloride  20 mEq Oral BID  . spironolactone  25 mg Oral Daily   Continuous:  WLN:[id number]  Assesment: Donna [name] was admitted with acute on chronic systolic heart failure. Donna [name] is improved. This may be from eating inappropriately. 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  When Donna [name] was admitted Donna [name] had acute hypoxic respiratory failure briefly requiring BiPAP but Donna [name] is off oxygen now. Donna [name] was able to ambulate last night and O2 saturation remained at  100%.  Donna [name] has diabetes which is pretty well controlled   Principal Problem:   Acute on chronic systolic (congestive) heart failure (HCC) Active Problems:   VENTRICULAR TACHYCARDIA   Diabetes mellitus, type  II ([place])   Acute respiratory failure with hypoxia (HCC)   Acute on chronic systolic CHF (congestive heart failure) ([place])    Plan: Continue treatments. Discussed with cardiology consultants about timing of discharge and any changes in medications needed    LOS: 0 days   Donna [name] 12/30/2015, 8:06 AM

## 2015-12-30 NOTE — Progress Notes (Signed)
Pt IV and telemetry removed, tolerated well.  Reviewed Living Better with Heart Failure with pt.  Pt stated that she already owns scale to weigh self daily.  Reviewed discharge instructions with pt and family,  Answered all questions at this time.

## 2015-12-30 NOTE — Progress Notes (Addendum)
Subjective:  Feels good, walking in halls without difficulty. Wanting to go home.  Objective:  Vital Signs in the last 24 hours: Temp:  [98 F (36.7 C)-98.7 F (37.1 C)] 98.7 F (37.1 C) (07/26 0500) Pulse Rate:  [62-68] 68 (07/26 0500) Resp:  [18-19] 18 (07/26 0500) BP: (102-110)/(52-62) 110/62 (07/26 0500) SpO2:  [96 %-99 %] 96 % (07/26 0500) Weight:  [117 lb 8 oz (53.3 kg)] 117 lb 8 oz (53.3 kg) (07/26 0500)  Intake/Output from previous day: 07/25 0701 - 07/26 0700 In: -  Out: 500 [Urine:500] Intake/Output from this shift: No intake/output data recorded.  Physical Exam: NECK: Without JVD, HJR, or bruit LUNGS: Decreased breath sounds but clear HEART: Regular rate and rhythm, S4 gallop, 2/6 systolic murmur apex, positive heave, no rub, bruit, thrill EXTREMITIES: Without cyanosis, clubbing, or edema   Lab Results:  Recent Labs  12/29/15 0116 12/29/15 0529  WBC 9.8 8.4  HGB 12.0 12.5  PLT 154 160    Recent Labs  12/29/15 0116 12/29/15 0529  NA 134* 137  K 3.6 3.5  CL 100* 99*  CO2 26 30  GLUCOSE 217* 154*  BUN 26* 25*  CREATININE 0.79 0.72    Recent Labs  12/29/15 1125 12/29/15 1657  TROPONINI 0.06* 0.05*   Hepatic Function Panel  Recent Labs  12/29/15 0116  PROT 7.0  ALBUMIN 4.1  AST 21  ALT 18  ALKPHOS 45  BILITOT 0.7   No results for input(s): CHOL in the last 72 hours. No results for input(s): PROTIME in the last 72 hours.    Cardiac Studies: Echocardiogram 10/18/2014 Left ventricle: There is global hypokinesis with akinesis of the   anterior, inferior walls and paradoxical septal motion. Overall   LVEF is severely refused estimated at 15-20%. The cavity size was   mildly dilated. There was moderate concentric hypertrophy.   Systolic function was severely reduced. The estimated ejection   fraction was in the range of 15-20%. Features are consistent with   a pseudonormal left ventricular filling pattern, with concomitant   abnormal  relaxation and increased filling pressure (grade 2   diastolic dysfunction). Doppler parameters are consistent with   elevated ventricular end-diastolic filling pressure. - Ventricular septum: Septal motion showed paradox. - Aortic valve: Trileaflet; normal thickness leaflets. There was no   regurgitation. - Aortic root: The aortic root was normal in size. - Mitral valve: Structurally normal valve. There was moderate   regurgitation. - Left atrium: The atrium was normal in size. - Right ventricle: Systolic function was mildly reduced. - Right atrium: The atrium was normal in size. - Tricuspid valve: There was mild regurgitation. - Pulmonary arteries: Systolic pressure was within the normal   range. PA peak pressure: 34 mm Hg (S). - Inferior vena cava: The vessel was normal in size. - Pericardium, extracardiac: A trivial pericardial effusion was   identified.   ICD interrogation 12/14/2015 CRT-D device check in office. Thresholds and sensing consistent with previous device measurements. Lead impedance trends stable over time. No mode switch episodes recorded. No ventricular arrhythmia episodes recorded. Patient bi-ventricularly pacing 98%  of the time. Device programmed with appropriate safety margins. Heart failure diagnostics reviewed and trends are stable for patient. No changes made this session. Estimated longevity 5.9-6.2 years.  Patient will follow up via [facility] on 10/9 and with  GT/R in 12 months.Memory Dance   1. Echocardiogram 10/08/2014 Left ventricle: There is global hypokinesis with akinesis of the   anterior, inferior walls and paradoxical  septal motion. Overall   LVEF is severely refused estimated at 15-20%. The cavity size was   mildly dilated. There was moderate concentric hypertrophy.   Systolic function was severely reduced. The estimated ejection   fraction was in the range of 15-20%. Features are consistent with   a pseudonormal left ventricular filling pattern,  with concomitant   abnormal relaxation and increased filling pressure (grade 2   diastolic dysfunction). Doppler parameters are consistent with   elevated ventricular end-diastolic filling pressure. - Ventricular septum: Septal motion showed paradox. - Aortic valve: Trileaflet; normal thickness leaflets. There was no   regurgitation. - Aortic root: The aortic root was normal in size. - Mitral valve: Structurally normal valve. There was moderate   regurgitation. - Left atrium: The atrium was normal in size. - Right ventricle: Systolic function was mildly reduced. - Right atrium: The atrium was normal in size. - Tricuspid valve: There was mild regurgitation. - Pulmonary arteries: Systolic pressure was within the normal   range. PA peak pressure: 34 mm Hg (S). - Inferior vena cava: The vessel was normal in size. - Pericardium, extracardiac: A trivial pericardial effusion was   identified.   2. ICD [employer] 11/18/2014 1. Biventricular ICD implantation.  2. Defibrillation threshold testing 3. Venography of the coronary sinus 4. Venography of the left upper extremity 5. ICD DFT testing  Assessment/Plan:  1. Acute on Chronic Systolic CHF with pulmonary edema: Likely from dietary non-compliance Has diuresed 2.5 liters since admission. Creatinine 0.72, CO2 30. On last office visit in 12/14/2015 wt was 123 lbs., now 117 lbs. Ok to go home on current meds. Will arrange f/u with Dr. Harl Bowie.   2. NICM: EF of 25%-20%: On appropriate medications. No ACE due to hypotension in the past.    3. ICD in situ: [employer]. Interrogated December 14, 2015. As above. Continue interrogation per protocol.    4. Hypothyroidism:TSH normal     LOS: 0 days    Ermalinda Barrios 12/30/2015, 8:11 AM   The patient was seen and examined, and I agree with the physical exam, assessment and plan as documented above by Gerrianne Scale PA-C, with modifications as noted below. Pt feeling well.  I encouraged her to take an  extra 40 mg for increasing SOB and/or leg swelling. Encouraged her to avoid sodium-laden foods. Can be discharged from my standpoint.   Kate Sable, MD, [facility]  12/30/2015 10:03 AM

## 2015-12-31 ENCOUNTER — Other Ambulatory Visit: Payer: Self-pay | Admitting: *Deleted

## 2015-12-31 ENCOUNTER — Encounter: Payer: Self-pay | Admitting: *Deleted

## 2015-12-31 NOTE — Discharge Summary (Signed)
Physician Discharge Summary  Patient ID: Donna [name] MRN: [id number] DOB/AGE: 01-04-1941 75 y.o. Primary Care Physician:Malaak Stach L, MD Admit date: 12/29/2015 Discharge date: 12/31/2015    Discharge Diagnoses:   Principal Problem:   Acute on chronic systolic (congestive) heart failure (HCC) Active Problems:   VENTRICULAR TACHYCARDIA   Diabetes mellitus, type II ([place])   Acute respiratory failure with hypoxia (HCC)   Acute on chronic systolic CHF (congestive heart failure) (HCC)     Medication List    TAKE these medications   acetaminophen 500 MG tablet Commonly known as:  TYLENOL Take 500 mg by mouth every 6 (six) hours as needed.   aspirin 81 MG tablet Take 81 mg by mouth daily.   atorvastatin 20 MG tablet Commonly known as:  LIPITOR Take 1 tablet (20 mg total) by mouth daily at 6 PM.   calcium gluconate 500 MG tablet Take 500 mg by mouth 2 (two) times daily. Notes to patient:  Take medication as directed   carvedilol 3.125 MG tablet Commonly known as:  COREG TAKE ONE TABLET TWICE DAILY   fish oil-omega-3 fatty acids 1000 MG capsule Take 1 capsule by mouth 3 (three) times daily.   furosemide 40 MG tablet Commonly known as:  LASIX Take 40 mg by mouth 2 (two) times daily. Reported on 08/26/2015   insulin detemir 100 UNIT/ML injection Commonly known as:  LEVEMIR Inject 12 Units into the skin at bedtime.   levothyroxine 75 MCG tablet Commonly known as:  SYNTHROID, LEVOTHROID Take 75 mcg by mouth daily before breakfast.   linagliptin 5 MG Tabs tablet Commonly known as:  TRADJENTA Take 5 mg by mouth daily.   losartan 50 MG tablet Commonly known as:  COZAAR Take 1 tablet (50 mg total) by mouth daily.   omeprazole 20 MG capsule Commonly known as:  PRILOSEC Take 20 mg by mouth 2 (two) times daily.   potassium chloride 20 MEQ packet Commonly known as:  KLOR-CON Take 20 mEq by mouth 2 (two) times daily.   spironolactone 25 MG tablet Commonly known  as:  ALDACTONE Take 25 mg by mouth daily.       Discharged Condition:Improved    Consults: Cardiology  Significant Diagnostic Studies: Dg Chest Port 1 View  Result Date: 12/29/2015 CLINICAL DATA:  Initial evaluation for acute shortness of breath for 1 day. History of CHF. EXAM: PORTABLE CHEST 1 VIEW COMPARISON:  Prior radiograph from 05/16/2015. FINDINGS: Patient is slightly rotated to the right. Left-sided transvenous pacemaker/ AICD in place. Cardiomegaly stable. Mediastinal silhouette within normal limits. Aortic atherosclerosis noted. Lungs normally inflated. Diffuse vascular congestion with indistinctness of the interstitial markings, most consistent with pulmonary edema. More confluent opacity within the right lower lobe favored to reflect edema and/or summation of shadows related to patient rotation. Superimposed infection could be considered in the correct clinical setting. No definite pleural effusion. No pneumothorax. No acute osseous abnormality. IMPRESSION: 1. Cardiomegaly with mild to moderate diffuse pulmonary edema, consistent with acute CHF exacerbation. Superimposed infection could be considered in the correct clinical setting. 2. Aortic atherosclerosis. Electronically Signed   By: Jeannine Boga M.D.   On: 12/29/2015 01:03   Lab Results: Basic Metabolic Panel:  Recent Labs  12/29/15 0116 12/29/15 0529  NA 134* 137  K 3.6 3.5  CL 100* 99*  CO2 26 30  GLUCOSE 217* 154*  BUN 26* 25*  CREATININE 0.79 0.72  CALCIUM 8.8* 8.9   Liver Function Tests:  Recent Labs  12/29/15 0116  AST  21  ALT 18  ALKPHOS 45  BILITOT 0.7  PROT 7.0  ALBUMIN 4.1     CBC:  Recent Labs  12/29/15 0116 12/29/15 0529  WBC 9.8 8.4  NEUTROABS 7.9*  --   HGB 12.0 12.5  HCT 36.1 37.7  MCV 97.3 97.7  PLT 154 160    No results found for this or any previous visit (from the past 240 hour(s)).   Hospital Course: This is a 75 year old who woke up on the morning of  admission very short of breath. She said she had not changed anything but after she thought about it she said that she had gone out to eat and she had been told that she could eat pork chops despite her heart failure and she thought that she could eat barbecue without any problems. However after eating barbecue she developed increased shortness of breath and came to the emergency department was found to be in pulmonary edema with some respiratory distress briefly requiring BiPAP. She was diuresed about 2.5 L. She was back at baseline. She had cardiology consultation. Medications were not changed because it was felt that this was likely related to dietary indiscretion. She was discharged home in improved condition.  Discharge Exam: Blood pressure 110/62, pulse 68, temperature 98.7 F (37.1 C), temperature source Oral, resp. rate 18, height 5\' 1"  (1.549 m), weight 53.3 kg (117 lb 8 oz), SpO2 96 %. She is awake and alert. Chest is clear. Heart is regular. She is not on oxygen now. No edema.  Disposition: Home continue current meds  Discharge Instructions    AMB Referral to [facility]    Complete by:  As directed   Please refer to [place] for restart of [facility] program services and transition of care calls. Patient previously active with A. Gilboy,RN CM.  Patient has hx of CHF. For questions please contact:  Royetta Crochet. Laymond Purser, RN, BSN, [place] [phone]) Business Cell  [phone]) Toll Free Office   Reason for consult:  Restart [facility] program services   Diagnoses of:  Heart Failure   Expected date of contact:  1-3 days (reserved for hospital discharges)   Discharge patient    Complete by:  As directed      Follow-up Information    Kate Sable, MD On 01/20/2016.   Specialty:  Cardiology Why:  at 1:00 pm.  Linna Hoff office) Contact information: [place] [address] [phone]            Signed: Aarion Kittrell L   12/31/2015, 9:01 AM

## 2015-12-31 NOTE — Patient Outreach (Signed)
[facility]) Care Management  12/31/2015  CHERIS LAMMERT Jan 03, 1941 [id number]   Mrs. Macha is a delightful 75 year old lady I know from following her in the the community last year when she was referred to [facility] after a hospital admission for CHF Exacerbation. She subsequently had AICD/BiVpacemaker device placement. I followed for a few months after device placement and referred her to [facility] case management department for ongoing telephonic case management support and tele-monitoring equipment program. Mrs. Butikofer was very engaged in self health management at that time and did very well with setting and meeting self health management goals.   Mrs. Eline lives in [place] in her home with an adult grandson. Her sister also lives in the area and has a close relationship with Mrs. Groome. When I last saw Mrs. Boomhower, she had been cleared to drive and was performing all ADL's and IADL's, home management, and driving independently.   Today, I reached out to Mrs.Guercio for post hospital/transition of care assessment. She was discharged from the hospital yesterday after an admision for CHF. I have reviewed all medications with her. Mrs. Cardarelli attributes her sudden volume overload to an unusual meal of pork barbeque. She normally doesn't eat this type meal and is strictly adherent to her prescribed diet.  Mrs. Cassells is scheduled to see Dr. Luan Pulling in the office on Monday, January 04, 2016.  Mrs. Jeng is weighing daily and recording. Her weight at home is 114lb. Her hospital discharge weight was 117lb. She denies shortness of breath or any other symptom or discomfort other than feeling "a little tired".   Plan: I will reach out to Mrs. Lorensen by phone next week. She will see Dr.Hawkins in the office on Monday. My partner will reach out to Mrs. Giuffre by phone in 2 weeks in my absence and I will follow up upon my return. I plan to see Mrs. Porada  in person at home on January 26, 2016 @ 11:30am.    [facility] Management  [phone]

## 2016-01-04 ENCOUNTER — Other Ambulatory Visit (HOSPITAL_COMMUNITY): Payer: Self-pay | Admitting: Pulmonary Disease

## 2016-01-04 ENCOUNTER — Ambulatory Visit (HOSPITAL_COMMUNITY)
Admission: RE | Admit: 2016-01-04 | Discharge: 2016-01-04 | Disposition: A | Discharge status: Home / Self Care | Payer: PPO | Source: Ambulatory Visit | Attending: Pulmonary Disease | Admitting: Pulmonary Disease

## 2016-01-04 DIAGNOSIS — M5136 Other intervertebral disc degeneration, lumbar region: Secondary | ICD-10-CM | POA: Insufficient documentation

## 2016-01-04 DIAGNOSIS — M545 Low back pain: Secondary | ICD-10-CM

## 2016-01-05 ENCOUNTER — Encounter: Payer: Self-pay | Admitting: *Deleted

## 2016-01-05 ENCOUNTER — Other Ambulatory Visit: Payer: Self-pay | Admitting: "Endocrinology

## 2016-01-05 ENCOUNTER — Other Ambulatory Visit: Payer: Self-pay | Admitting: *Deleted

## 2016-01-05 NOTE — Patient Outreach (Signed)
[facility]) Care Management  01/05/2016  STACEY STAYNER January 01, 1941 [id number]   Unable to reach Mrs. Talmadge by phone today to follow up and provide transition of care coordination services regarding her recent hospitalization for CHF. I left a HIPPA compliant voice message requesting a return call.   Plan: I will reach out to Mrs. Cantin on ThursdayJanalyn Shy [facility]  [phone]

## 2016-01-07 ENCOUNTER — Encounter: Payer: Self-pay | Admitting: *Deleted

## 2016-01-07 ENCOUNTER — Other Ambulatory Visit: Payer: Self-pay | Admitting: *Deleted

## 2016-01-07 NOTE — Patient Outreach (Signed)
[name]) Care Management  01/07/2016  Donna [name] [birth year] [id number]   Donna [name] is a delightful 75 year old lady I know from following her in the the community last year when she was referred to [facility] after a hospital admission for CHF Exacerbation. She subsequently had AICD/BiVpacemaker device placement. I followed for a few months after device placement and referred her to [facility] case management department for ongoing telephonic case management support and tele-monitoring equipment program. Donna [name] was very engaged in self health management at that time and did very well with setting and meeting self health management goals.   Donna [name] lives in [place] in her home with an adult grandson. Her sister also lives in the area and has a close relationship with Donna [name]. When I last saw Donna [name], she had been cleared to drive and was performing all ADL's and IADL's, home management, and driving independently.   I have been following Donna [name] for post hospital/transition of care assessment. She was discharged from the hospital on 12/30/15 after an admision for CHF which Donna [name] attributes to dietary indiscretion, which is very unusual for her. DonnaCarson follows her prescribed treatment plan rigorously and I believe just lacked knowledge about the sodium content of a new dish she tried and liked (and  Was eating fairly regularly) at her favorite restaurant (one she frequents with her sister 2-3 times/week). We have discussed her prescribed low sodium, cardiac prudent diet at length and I have provided printed Emmi educational materials for Donna [name].   Donna [name] saw Dr. Luan [name] in the office on Monday, January 04, 2016.  Donna [name] is weighing daily and recording. Her weight at home is 115lb today and she is staying within 3 pounds of her hospital discharge weight of 117lb. She denies shortness of breath or any other  symptom or discomfort other than feeling "a little tired".   Plan: Donna [name] will be contacted by phone weekly for at least 30 days post hospitalization as part of our transition of care services to her. I will  plan to see Donna [name] in person at home on January 26, 2016 @ 11:30am.   [facility] CM Care Plan Problem One   Flowsheet Row Most Recent Value  Care Plan Problem One  Acute/Chronic Health Condition with recurrent symptoms (CHF)  Role Documenting the Problem One  Care Management Coordinator  Care Plan for Problem One  Active  [facility] Long Term Goal (31-90 days)  Over the next 60 days, patient will verbalize understanding of self health management/plan of care for CHF  [facility] Long Term Goal Start Date  12/31/15  Interventions for Problem One Long Term Goal  Utilizing teachback method, reviewed with patient discharge instructions and established plan of care for self health mangaement of CHF  [facility] CM Short Term Goal #1 (0-30 days)  Over the next 30 days, patient will attend all scheduled provider appointments  [facility] CM Short Term Goal #1 Start Date  12/31/15  Interventions for Short Term Goal #1  Reviewed upcoming provider appointments with patient,  ensured she has transportation  [facility] CM Short Term Goal #2 (0-30 days)  Over the next 30 days, patient will weigh daily and record, calling for weight gain of 3# overnight or 5# in a week  [facility] CM Short Term Goal #2 Start Date  12/31/15  Interventions for Short Term Goal #2  Reviewed with patient importance of daily weights and reporting of weight gain  [facility]N  CM Short Term Goal #3 (0-30 days)  Over the next 30 days, patient will verbalize understanding of signs and symptms of worsening chf  [facility] CM Short Term Goal #3 Start Date  12/31/15  Interventions for Short Tern Goal #3  Utilizing teachback method and [employer] (to be mailed) , rviewed with patient signs and symptoms of worsening CHF      [facility]  Management  [phone]

## 2016-01-12 ENCOUNTER — Other Ambulatory Visit: Payer: Self-pay | Admitting: *Deleted

## 2016-01-12 ENCOUNTER — Encounter: Payer: Self-pay | Admitting: *Deleted

## 2016-01-12 NOTE — Patient Outreach (Signed)
Transition of care call  Spoke with pleasant lady. She reports she is doing ok, patient reports weight at 112#, but she states she switches between 2 scales. Patient has questions about diet and foods she can eat and to avoid. Patient has all of her medications. She is to see Dr. Luan Pulling in September and Cardiologist later in month.  No new concerns other than diet questions this call.  Assessment: HF: educated on using only one scale for accuracy of monitoring fluid weight. Reviewed low salt diet and can food.   Plan: Patient will report an increase in symptoms or weight to MD. This in [facility] will report to assigned RNCM when she returns. Royetta Crochet. Laymond Purser, RN, BSN, [place] [phone]

## 2016-01-13 ENCOUNTER — Telehealth: Payer: Self-pay | Admitting: Cardiology

## 2016-01-13 NOTE — Telephone Encounter (Signed)
Error/tg °

## 2016-01-19 ENCOUNTER — Encounter: Payer: Self-pay | Admitting: Cardiology

## 2016-01-19 ENCOUNTER — Encounter: Payer: Self-pay | Admitting: *Deleted

## 2016-01-19 ENCOUNTER — Other Ambulatory Visit: Payer: Self-pay | Admitting: *Deleted

## 2016-01-19 DIAGNOSIS — W19XXXA Unspecified fall, initial encounter: Secondary | ICD-10-CM | POA: Insufficient documentation

## 2016-01-19 DIAGNOSIS — I5020 Unspecified systolic (congestive) heart failure: Secondary | ICD-10-CM

## 2016-01-19 NOTE — Patient Outreach (Signed)
[facility]) Care Management  01/19/2016  Donna [name] 04-27-1941 [id number]   I reached out to Donna [name] today to follow up on her CHF disease management and transition of care needs. Donna [name] says her weigh remains stable and she is asymptomatic. She is taking medications as prescribed and is scheduled to see Dr. Harl Bowie tomorrow.   Donna [name] reports a fall on Monday. She says she fell in the kitchen after tripping on the corner of a rug. She denies associated loss of consciousness, dizziness, lightheadedness, shortness of breath, chest pain, or other symptom.   Plan: I have schedule a face to face visit with Donna [name] for Thursday for follow up.    [facility] Management  [phone]

## 2016-01-20 ENCOUNTER — Ambulatory Visit: Payer: Self-pay | Admitting: Cardiovascular Disease

## 2016-01-21 ENCOUNTER — Other Ambulatory Visit: Payer: Self-pay | Admitting: *Deleted

## 2016-01-21 ENCOUNTER — Encounter: Payer: Self-pay | Admitting: *Deleted

## 2016-01-21 NOTE — Patient Outreach (Signed)
[facility]) Care Management  01/21/2016  LANAYSHA ORGAN July 24, 1940 [id number]   Mrs. Bredeson was not at home this morning when I arrived for our appointment. She left me a note stating she'd lost my number and had to go with her grandson to [place] and asked if I would return a call to her.   Plan: I will reach out to Mrs. Handshoe by phone and will keep our previously scheduled appointment for next Tuesday.    [place] Management  [phone]

## 2016-01-26 ENCOUNTER — Ambulatory Visit: Payer: Self-pay | Admitting: *Deleted

## 2016-01-28 ENCOUNTER — Encounter: Payer: Self-pay | Admitting: *Deleted

## 2016-01-28 ENCOUNTER — Other Ambulatory Visit: Payer: Self-pay | Admitting: *Deleted

## 2016-01-28 NOTE — Patient Outreach (Signed)
[facility]) Care Management  01/28/2016  Donna [name] 1941-02-23 [id number]   [name] was not at home today when I arrived for our scheduled home visit. I called the home and got no answer but was able to leave a voice message requesting a return call from [name].   Plan: I will reach out to [name] by phone next week.    [facility] Management  [phone]

## 2016-02-02 ENCOUNTER — Encounter: Payer: Self-pay | Admitting: *Deleted

## 2016-02-02 ENCOUNTER — Other Ambulatory Visit: Payer: Self-pay | Admitting: *Deleted

## 2016-02-02 NOTE — Patient Outreach (Signed)
[facility]) Care Management  02/02/2016  Donna [name] Oct 14, 1940 [id number]   I spoke with [name] today to follow up on her post hospital transition of care needs related to CHF. She says today her weight is 112lb 5oz. She denies shortness of breath or chest pain. She reports feeling "more tired and weak than usual" and wonders if her blood pressure is too low.   [name] is scheduled to see the cardiology PA in the office tomorrow. I advised that she tell her about the symptoms mentioned above.   Plan: I will see [name] at home next week for a face to face visit.    [place] Management  [phone]

## 2016-02-03 ENCOUNTER — Encounter: Payer: Self-pay | Admitting: Physician Assistant

## 2016-02-03 ENCOUNTER — Ambulatory Visit (INDEPENDENT_AMBULATORY_CARE_PROVIDER_SITE_OTHER): Payer: PPO | Admitting: Physician Assistant

## 2016-02-03 VITALS — BP 118/62 | HR 75 | Ht 61.0 in | Wt 120.0 lb

## 2016-02-03 DIAGNOSIS — R42 Dizziness and giddiness: Secondary | ICD-10-CM | POA: Diagnosis not present

## 2016-02-03 DIAGNOSIS — I10 Essential (primary) hypertension: Secondary | ICD-10-CM

## 2016-02-03 DIAGNOSIS — I429 Cardiomyopathy, unspecified: Secondary | ICD-10-CM

## 2016-02-03 DIAGNOSIS — I428 Other cardiomyopathies: Secondary | ICD-10-CM

## 2016-02-03 DIAGNOSIS — I5022 Chronic systolic (congestive) heart failure: Secondary | ICD-10-CM

## 2016-02-03 DIAGNOSIS — Z95810 Presence of automatic (implantable) cardiac defibrillator: Secondary | ICD-10-CM

## 2016-02-03 MED ORDER — LOSARTAN POTASSIUM 25 MG PO TABS: 25.0000 mg | ORAL_TABLET | Freq: Every day | ORAL | 3 refills | Status: DC

## 2016-02-03 NOTE — Patient Instructions (Signed)
Your physician recommends that you schedule a follow-up appointment in: 4-6 Weeks Dr. Harl Bowie  Your physician has recommended you make the following change in your medication:  Decrease Losartan 25 mg Daily   If you need a refill on your cardiac medications before your next appointment, please call your pharmacy.  Thank you for choosing [facility]!

## 2016-02-03 NOTE — Progress Notes (Signed)
Cardiology Office Note    Date:  02/03/2016   ID:  [name], [name] [birth year], MRN [id number]  PCP:  Alonza Bogus, MD  Cardiologist: Dr. Harl Bowie EPS:Dr. Lovena Le  No chief complaint on file.   History of Present Illness:  Donna [name] is a 75 y.o. female with history of nonischemic cardiomyopathy, hypertension, hyperlipidemia and ventricular tachycardia status post biventricular ICD, wide QRS tachycardia for which she is chronically been on amiodarone low-dose. She was having some dizziness and Dr. Lovena Le reduced her losartan to 50 mg daily as her blood pressure was running low. ICD check was normal at that time.  She was admitted to the hospital with pulmonary edema felt secondary to dietary noncompliance 12/2015.  Patient comes in today accompanied by her sister. She complains of being weak and dizzy as well as off balance. She denies dizziness when she changes positions but is just weak and dizzy all of time. She just doesn't feel good today. She has good days and bad days. Her weight is 120 pounds in our office and she was 112 pounds when she went home. On her scales at her home she was 112 pounds today. She's had no edema, dyspnea, chest pain or palpitations.      Past Medical History:  Diagnosis Date  . AICD (automatic cardioverter/defibrillator) present   . Arthritis    "fingers" (11/18/2014)  . Atrial flutter (HCC)    Versus ventricular tachycardia; treated with amiodarone  . Cardiomyopathy, nonischemic ([place])    Nl cors in [place]; CHF in 12/97; EF of 40% in 5/01 and 7/03, 25% in 9/04 and 4/08.  systolic murmur without valvular abnormalities by echo; refused automatic implantable cardiac defibrillator  . Cerebrovascular disease    Duplex study in 12/2006 shows atherosclerosis without focal stenosis  . CHF (congestive heart failure) ([place])   . Diabetes mellitus, type II ([place])    No insulin  . Gastroesophageal reflux disease   . Hyperlipidemia   .  Hypertension   . Left bundle branch block   . Ventricular tachycardia ([place])    a. PMH it lists "atrial flutter versus ventricular tachycardia treated with amiodarone" - details of this are unclear. Notes from back to 2006 indicate she has been maintained on amiodarone but do not describe further indication. Patient's sister reports pt was told she had skipped beats that could cause her to drop dead - Dr. Lattie Haw put her on amiodarone and recommended a defibrillator.    Past Surgical History:  Procedure Laterality Date  . ABDOMINAL HYSTERECTOMY  2006  . BI-VENTRICULAR IMPLANTABLE CARDIOVERTER DEFIBRILLATOR  (CRT-D)  11/18/2014  . COLONOSCOPY  2008  . EP IMPLANTABLE DEVICE N/A 11/18/2014   Procedure: BiV ICD Insertion CRT-D;  Surgeon: Evans Lance, MD;  Location: [facility];  Service: Cardiovascular;  Laterality: N/A;  . HERNIA REPAIR Right   . TONSILLECTOMY      Current Medications: Outpatient Medications Prior to Visit  Medication Sig Dispense Refill  . acetaminophen (TYLENOL) 500 MG tablet Take 500 mg by mouth every 6 (six) hours as needed.    Marland Kitchen aspirin 81 MG tablet Take 81 mg by mouth daily.    Marland Kitchen atorvastatin (LIPITOR) 20 MG tablet Take 1 tablet (20 mg total) by mouth daily at 6 PM. 30 tablet 12  . calcium gluconate 500 MG tablet Take 500 mg by mouth 2 (two) times daily.      . carvedilol (COREG) 3.125 MG tablet TAKE ONE TABLET TWICE  DAILY 60 tablet 11  . fish oil-omega-3 fatty acids 1000 MG capsule Take 1 capsule by mouth 3 (three) times daily.      . furosemide (LASIX) 40 MG tablet Take 40 mg by mouth 2 (two) times daily. Reported on 08/26/2015    . insulin detemir (LEVEMIR) 100 UNIT/ML injection Inject 12 Units into the skin at bedtime.    Marland Kitchen levothyroxine (SYNTHROID, LEVOTHROID) 75 MCG tablet Take 75 mcg by mouth daily before breakfast.    . linagliptin (TRADJENTA) 5 MG TABS tablet Take 5 mg by mouth daily.    Marland Kitchen omeprazole (PRILOSEC) 20 MG capsule Take 20 mg by mouth 2  (two) times daily.      . potassium chloride (KLOR-CON) 20 MEQ packet Take 20 mEq by mouth 2 (two) times daily. 60 tablet 1  . spironolactone (ALDACTONE) 25 MG tablet Take 25 mg by mouth daily.      Marland Kitchen UNIFINE PENTIPS 32G X 4 MM MISC USE TWICE A DAY TO TAKE INSULIN. 100 each 2  . losartan (COZAAR) 50 MG tablet Take 1 tablet (50 mg total) by mouth daily. 90 tablet 3   No facility-administered medications prior to visit.      Allergies:   Codeine and Decongestant [pseudoephedrine hcl er]   Social History   Social History  . Marital status: Widowed    Spouse name: N/A  . Number of children: N/A  . Years of education: N/A   Occupational History  . Retired Retired   Social History Main Topics  . Smoking status: Never Smoker  . Smokeless tobacco: Never Used  . Alcohol use No  . Drug use: No  . Sexual activity: No   Other Topics Concern  . None   Social History Narrative   Lives in [place] with son and grandson   Physically active     Family History:  The patient's   family history includes Diabetes in her mother and sister; Heart attack in her brother; Kidney disease in her mother.   ROS:   Please see the history of present illness.    Review of Systems  Constitution: Positive for weakness and malaise/fatigue.  HENT: Negative.   Eyes: Negative.   Cardiovascular: Negative.   Respiratory: Negative.   Hematologic/Lymphatic: Negative.   Musculoskeletal: Positive for muscle weakness. Negative for joint pain.  Gastrointestinal: Negative.   Genitourinary: Negative.   Neurological: Positive for dizziness and loss of balance.   All other systems reviewed and are negative.   PHYSICAL EXAM:   VS:  BP 118/62   Pulse 75   Ht 5\' 1"  (1.549 m)   Wt 120 lb (54.4 kg)   SpO2 95%   BMI 22.67 kg/m   Physical Exam  GEN: Well nourished, well developed, in no acute distress  Neck: no JVD, carotid bruits, or masses Cardiac:RRR; 1 to 2/6 systolic murmur at the left sternal border,  no rubs, or gallops  Respiratory:  clear to auscultation bilaterally, normal work of breathing GI: soft, nontender, nondistended, + BS Ext: without cyanosis, clubbing, or edema, Good distal pulses bilaterally MS: no deformity or atrophy  Skin: warm and dry, no rash Psych: euthymic mood, full affect  Wt Readings from Last 3 Encounters:  02/03/16 120 lb (54.4 kg)  02/02/16 112 lb 5 oz (50.9 kg)  01/12/16 112 lb (50.8 kg)      Studies/Labs Reviewed:   EKG:  EKG is Not ordered today.   Recent Labs: 05/25/2015: Magnesium 2.4 12/29/2015: ALT 18; B Natriuretic  Peptide 872.0; BUN 25; Creatinine, Ser 0.72; Hemoglobin 12.5; Platelets 160; Potassium 3.5; Sodium 137; TSH 3.009   Lipid Panel    Component Value Date/Time   CHOL 119 04/02/2009 0950   TRIG 172 04/02/2009 0950   HDL 34 04/02/2009 0950   CHOLHDL 5.1 05/09/2008 0332   VLDL 42 (H) 05/09/2008 0332   LDLCALC 51 04/02/2009 0950    Additional studies/ records that were reviewed today include:  Cardiac Studies: Echocardiogram 10/18/2014 Left ventricle: There is global hypokinesis with akinesis of the   anterior, inferior walls and paradoxical septal motion. Overall   LVEF is severely refused estimated at 15-20%. The cavity size was   mildly dilated. There was moderate concentric hypertrophy.   Systolic function was severely reduced. The estimated ejection   fraction was in the range of 15-20%. Features are consistent with   a pseudonormal left ventricular filling pattern, with concomitant   abnormal relaxation and increased filling pressure (grade 2   diastolic dysfunction). Doppler parameters are consistent with   elevated ventricular end-diastolic filling pressure. - Ventricular septum: Septal motion showed paradox. - Aortic valve: Trileaflet; normal thickness leaflets. There was no   regurgitation. - Aortic root: The aortic root was normal in size. - Mitral valve: Structurally normal valve. There was moderate    regurgitation. - Left atrium: The atrium was normal in size. - Right ventricle: Systolic function was mildly reduced. - Right atrium: The atrium was normal in size. - Tricuspid valve: There was mild regurgitation. - Pulmonary arteries: Systolic pressure was within the normal   range. PA peak pressure: 34 mm Hg (S). - Inferior vena cava: The vessel was normal in size. - Pericardium, extracardiac: A trivial pericardial effusion was   identified.   ICD interrogation 12/14/2015 CRT-D device check in office. Thresholds and sensing consistent with previous device measurements. Lead impedance trends stable over time. No mode switch episodes recorded. No ventricular arrhythmia episodes recorded. Patient bi-ventricularly pacing 98%  of the time. Device programmed with appropriate safety margins. Heart failure diagnostics reviewed and trends are stable for patient. No changes made this session. Estimated longevity 5.9-6.2 years.  Patient will follow up via [facility] on 10/9 and with  GT/R in 12 months.Memory Dance       ASSESSMENT:    1. Cardiomyopathy, nonischemic ([place])   2. Dizziness   3. Chronic systolic heart failure ([place])   4. Essential hypertension   5. S/P ICD (internal cardiac defibrillator) procedure      PLAN:  In order of problems listed above:  Nonischemic cardiomyopathy with recent admission with heart failure now well compensated. Weight is up on our scales but they're stable at home and she has no heart failure and exam. Continue current diuretics.  Dizziness with blood pressures running on the low side. Will decrease losartan 25 mg once daily to see if this helps follow-up with Dr. branch in 4-6 weeks.  Chronic systolic heart failure compensated see above about weight change  Essential hypertension blood pressures running on the low side. Patient checks her blood pressure at home daily and it has been low. We'll decrease losartan.  Status post ICD followed by Dr.  Lovena Le    Medication Adjustments/Labs and Tests Ordered: Current medicines are reviewed at length with the patient today.  Concerns regarding medicines are outlined above.  Medication changes, Labs and Tests ordered today are listed in the Patient Instructions below. There are no Patient Instructions on file for this visit.   Signed, Ermalinda Barrios, PA-C  02/03/2016 1:39 PM    [facility] [address], [place], [place]  [address] Phone: [phone]; Fax: [phone]

## 2016-02-03 NOTE — Addendum Note (Signed)
Addended by: Clerance Lav on: 02/03/2016 01:15 PM   Modules accepted: Orders

## 2016-02-09 ENCOUNTER — Encounter: Payer: Self-pay | Admitting: *Deleted

## 2016-02-09 ENCOUNTER — Other Ambulatory Visit: Payer: Self-pay | Admitting: *Deleted

## 2016-02-09 NOTE — Patient Outreach (Signed)
[facility]) Care Management   02/09/2016  Donna [name] 1940-12-12 [id number]  Donna [name] is a delightful 75 year old lady with recent hospital admission for acute on chronic systolic heart failure and history of ventricular tachycardia s/p AICD/BiV pacemaker device placement, diabetes mellitus, type II, and acute respiratory failure with hypoxia.  [name] lives in [place] in her home with an adult grandson. Her sister also lives in the area and has a close relationship with [name].   I have been following [name] for post hospital/transition of care assessment.   Subjective: "If I could just get rid of this allergy cold, I think I'd feel just fine."  Objective:  BP 118/72   Pulse 74   Wt 113 lb 2 oz (51.3 kg)   SpO2 97%   BMI 21.37 kg/m   Review of Systems  Constitutional: Negative.   HENT: Positive for congestion.   Eyes: Positive for discharge.       Patient reports "teary eyes"  Respiratory: Negative for cough, sputum production, shortness of breath and wheezing.   Cardiovascular: Negative.  Negative for chest pain, palpitations, orthopnea, leg swelling and PND.  Gastrointestinal: Negative for abdominal pain, heartburn, nausea and vomiting.  Genitourinary: Negative.   Musculoskeletal: Positive for myalgias.       Complains of muscles aches and "arthritis pain in the back"  Skin: Negative.   Neurological: Positive for headaches.  Endo/Heme/Allergies: Negative.   Psychiatric/Behavioral: Negative.     Physical Exam  Constitutional: She is oriented to person, place, and time. Vital signs are normal. She appears well-developed and well-nourished. She is active.  Cardiovascular: Normal rate, regular rhythm, S1 normal and S2 normal.  Exam reveals no gallop and no friction rub.   No murmur heard. Respiratory: Effort normal. She has no wheezes. She has no rhonchi. She has rales.  GI: Soft. Bowel sounds are normal.  Neurological: She  is alert and oriented to person, place, and time.  Skin: Skin is warm and dry.  Psychiatric: She has a normal mood and affect. Her speech is normal and behavior is normal. Judgment and thought content normal. Cognition and memory are normal.    Encounter Medications:   Outpatient Encounter Prescriptions as of 02/09/2016  Medication Sig  . acetaminophen (TYLENOL) 500 MG tablet Take 500 mg by mouth every 6 (six) hours as needed.  Marland Kitchen aspirin 81 MG tablet Take 81 mg by mouth daily.  Marland Kitchen atorvastatin (LIPITOR) 20 MG tablet Take 1 tablet (20 mg total) by mouth daily at 6 PM.  . calcium gluconate 500 MG tablet Take 500 mg by mouth 2 (two) times daily.    . carvedilol (COREG) 3.125 MG tablet TAKE ONE TABLET TWICE DAILY  . fish oil-omega-3 fatty acids 1000 MG capsule Take 1 capsule by mouth 3 (three) times daily.    . furosemide (LASIX) 40 MG tablet Take 40 mg by mouth 2 (two) times daily. Reported on 08/26/2015  . insulin detemir (LEVEMIR) 100 UNIT/ML injection Inject 12 Units into the skin at bedtime.  Marland Kitchen levothyroxine (SYNTHROID, LEVOTHROID) 75 MCG tablet Take 75 mcg by mouth daily before breakfast.  . linagliptin (TRADJENTA) 5 MG TABS tablet Take 5 mg by mouth daily.  Marland Kitchen losartan (COZAAR) 25 MG tablet Take 1 tablet (25 mg total) by mouth daily.  Marland Kitchen omeprazole (PRILOSEC) 20 MG capsule Take 20 mg by mouth 2 (two) times daily.    . potassium chloride (KLOR-CON) 20 MEQ packet Take 20 mEq by mouth  2 (two) times daily.  Marland Kitchen spironolactone (ALDACTONE) 25 MG tablet Take 25 mg by mouth daily.    Marland Kitchen UNIFINE PENTIPS 32G X 4 MM MISC USE TWICE A DAY TO TAKE INSULIN.    Assessment:  Pleasant 75 year old lady living in [place] in her home with her adult grandson, recently discharged from the hospital after an episode of acute/chronic heart failure.   Acute/Chronic Health Condition (CHF) - [name] has been weighing daily and recording; she is taking medications as prescribed; she is very knowledgeable about  and is adhering to her prescribed low sodium/heart healthy diet and fluid restriction; she visited the cardiology office last week and her losartan was decreased to 25 mg once daily when she reported feeling dizzy and weaker than usual since her hospital discharge; she is to see Dr. Harl Bowie in follow up on Tuesday, 02/23/16. We have reviewed signs and symptoms of worsening CHF using the STOP LIGHT tool and when to call for assistance.   Acute Health Condition - [name] reports 2-3 days of nasal congestion, "watery or teary eyes", and mild headache. She says she and her sister are experiencing the same symptoms and believes she is suffering from "hay fever". We discussed her symptoms and I advised that she report worsening of symptoms or fever to her primary care provider  Home Safety/Fall Risk - since returning home from the hospital, [name] has experienced dizziness as outlined above; her Losartan has been decreased; she is checking bp daily; she denies loss of consciousness or falls since our last discussion over the phone; we have discussed fall risk reduction strategies including rising slowly and standing for a few moments before moving forward/ambulating, removing area rugs, and reporting changes in BP as per provider recommendations  Medication Management/Adherence Concerns related to financial constraints - [name] is having trouble affording her Tradjenta; Dr. Luan Pulling office provided a few sample boxes last week and I referred her to our pharmacy team for medication assistance  Plan:   [name] will continue to measure her weight and blood pressure daily and record, calling her provider for findings outside established parameters or symptoms outside her baseline.   I will reach out to [name] next week to follow up on change in bp medication and dizziness and continued transition of care assessments.   [facility] CM Care Plan Problem One   Flowsheet Row Most Recent Value   Care Plan Problem One  Acute/Chronic Health Condition with recurrent symptoms (CHF)  Role Documenting the Problem One  Care Management Coordinator  Care Plan for Problem One  Active  THN Long Term Goal (31-90 days)  Over the next 60 days, patient will verbalize understanding of self health management/plan of care for CHF  [facility] Long Term Goal Start Date  12/31/15  Interventions for Problem One Long Term Goal  Reviewed heart healthy diet and low sodium food choices  THN CM Short Term Goal #1 (0-30 days)  Over the next 30 days, patient will attend all scheduled provider appointments  THN CM Short Term Goal #1 Start Date  12/31/15  THN CM Short Term Goal #2 (0-30 days)  Over the next 30 days, patient will weigh daily and record, calling for weight gain of 3# overnight or 5# in a week  THN CM Short Term Goal #2 Start Date  12/31/15  [facility] CM Short Term Goal #2 Met Date  02/02/16  Interventions for Short Term Goal #2  Instructed to use same scale  daily instead of switching in order to more close monitor weight with better accuracy  THN CM Short Term Goal #3 (0-30 days)  Over the next 30 days, patient will verbalize understanding of signs and symptms of worsening chf  THN CM Short Term Goal #3 Start Date  12/31/15  [facility] CM Short Term Goal #3 Met Date  02/02/16  Interventions for Short Tern Goal #3  instructed on importance ot self-monitoring    [facility] CM Care Plan Problem Two   Flowsheet Row Most Recent Value  Care Plan Problem Two  Home Safety/Fall Risk  Role Documenting the Problem Two  Care Management Coordinator [alone during the [place] for Problem Two  Active  Interventions for Problem Two Long Term Goal   Discussed fall risk reduction strategies with patient,  scheduled face to face appointment for follow up,  [facility] educational materials prescribed  THN Long Term Goal (31-90) days  Over the next 31 days, patient will verbalize understanding of fall risk reducation strategies  THN Long Term  Goal Start Date  01/20/16    [facility] CM Care Plan Problem Three   Flowsheet Row Most Recent Value  Care Plan Problem Three  Medication Mangaement/Adherence Concerns related to Financial Constraints  Role Documenting the Problem Three  Care Management Coordinator  Care Plan for Problem Three  Active  THN Long Term Goal (31-90) days  Over the next 30 days, patient will work with [place] team to address medication management needs  [facility] Long Term Goal Start Date  02/02/16      Janalyn Shy [place] Management  [phone]

## 2016-02-19 ENCOUNTER — Ambulatory Visit: Payer: Self-pay | Admitting: Cardiology

## 2016-02-22 ENCOUNTER — Other Ambulatory Visit: Payer: Self-pay | Admitting: *Deleted

## 2016-02-22 NOTE — Patient Outreach (Signed)
[facility]) Care Management  02/22/2016  Donna [name] 02/14/1941 [id number]  Unable to reach Donna [name] today at home for transition of care assessment. I left a HIPPA compliant message requesting a return call.   Plan: I will reach out to Donna [name] again next week by phone.    [place] Management  [phone]

## 2016-02-23 ENCOUNTER — Ambulatory Visit (INDEPENDENT_AMBULATORY_CARE_PROVIDER_SITE_OTHER): Payer: PPO | Admitting: Cardiology

## 2016-02-23 ENCOUNTER — Encounter: Payer: Self-pay | Admitting: Cardiology

## 2016-02-23 VITALS — BP 108/58 | HR 69 | Ht 61.0 in | Wt 120.0 lb

## 2016-02-23 DIAGNOSIS — E785 Hyperlipidemia, unspecified: Secondary | ICD-10-CM | POA: Diagnosis not present

## 2016-02-23 DIAGNOSIS — I5022 Chronic systolic (congestive) heart failure: Secondary | ICD-10-CM

## 2016-02-23 DIAGNOSIS — Z79899 Other long term (current) drug therapy: Secondary | ICD-10-CM | POA: Diagnosis not present

## 2016-02-23 DIAGNOSIS — R5383 Other fatigue: Secondary | ICD-10-CM | POA: Diagnosis not present

## 2016-02-23 DIAGNOSIS — R42 Dizziness and giddiness: Secondary | ICD-10-CM

## 2016-02-23 MED ORDER — LOSARTAN POTASSIUM 25 MG PO TABS: 12.5000 mg | ORAL_TABLET | Freq: Every day | ORAL | 3 refills | Status: DC

## 2016-02-23 NOTE — Progress Notes (Signed)
Clinical Summary Donna [name] is a 75 y.o.female seen today for follow up of the following medical problems  1. NICM  - long history of cardiomyopathy, prior caths without obstructive disease  - LVEF 20% by echo 06/2011,  - echo 10/2014 LVEF 15-20%.  - she has BiV AICD followed by Dr Lovena Le.   - no sob or DOE. No recent LE edema. Home weights stable around 115 lbs.  -she has chronic fatigue and dizziness, we have been downtitrating her CHF meds to see if symptoms improve, thus far they haven't. Has had some occasional soft bp's in clinic.   2. Ventricular tachycardia  - followed by EP. She has BiV AICD.   3. Hyperlipidemia - compliant with statin    Past Medical History:  Diagnosis Date  . AICD (automatic cardioverter/defibrillator) present   . Arthritis    "fingers" (11/18/2014)  . Atrial flutter (HCC)    Versus ventricular tachycardia; treated with amiodarone  . Cardiomyopathy, nonischemic ([place])    Nl cors in [place]; CHF in 12/97; EF of 40% in 5/01 and 7/03, 25% in 9/04 and 4/08.  systolic murmur without valvular abnormalities by echo; refused automatic implantable cardiac defibrillator  . Cerebrovascular disease    Duplex study in 12/2006 shows atherosclerosis without focal stenosis  . CHF (congestive heart failure) ([place])   . Diabetes mellitus, type II ([place])    No insulin  . Gastroesophageal reflux disease   . Hyperlipidemia   . Hypertension   . Left bundle branch block   . Ventricular tachycardia ([place])    a. PMH it lists "atrial flutter versus ventricular tachycardia treated with amiodarone" - details of this are unclear. Notes from back to 2006 indicate she has been maintained on amiodarone but do not describe further indication. Patient's sister reports pt was told she had skipped beats that could cause her to drop dead - Dr. Lattie Haw put her on amiodarone and recommended a defibrillator.     Allergies  Allergen Reactions  . Codeine Other (See  Comments)    Reaction:  Racing heart   . Decongestant [Pseudoephedrine Hcl Er] Other (See Comments)    Reaction:  Racing heart      Current Outpatient Prescriptions  Medication Sig Dispense Refill  . acetaminophen (TYLENOL) 500 MG tablet Take 500 mg by mouth every 6 (six) hours as needed.    Marland Kitchen aspirin 81 MG tablet Take 81 mg by mouth daily.    Marland Kitchen atorvastatin (LIPITOR) 20 MG tablet Take 1 tablet (20 mg total) by mouth daily at 6 PM. 30 tablet 12  . calcium gluconate 500 MG tablet Take 500 mg by mouth 2 (two) times daily.      . carvedilol (COREG) 3.125 MG tablet TAKE ONE TABLET TWICE DAILY 60 tablet 11  . fish oil-omega-3 fatty acids 1000 MG capsule Take 1 capsule by mouth 3 (three) times daily.      . furosemide (LASIX) 40 MG tablet Take 40 mg by mouth 2 (two) times daily. Reported on 08/26/2015    . insulin detemir (LEVEMIR) 100 UNIT/ML injection Inject 12 Units into the skin at bedtime.    Marland Kitchen levothyroxine (SYNTHROID, LEVOTHROID) 75 MCG tablet Take 75 mcg by mouth daily before breakfast.    . linagliptin (TRADJENTA) 5 MG TABS tablet Take 5 mg by mouth daily.    Marland Kitchen losartan (COZAAR) 25 MG tablet Take 1 tablet (25 mg total) by mouth daily. 90 tablet 3  . omeprazole (PRILOSEC) 20 MG  capsule Take 20 mg by mouth 2 (two) times daily.      . potassium chloride (KLOR-CON) 20 MEQ packet Take 20 mEq by mouth 2 (two) times daily. 60 tablet 1  . spironolactone (ALDACTONE) 25 MG tablet Take 25 mg by mouth daily.      Marland Kitchen UNIFINE PENTIPS 32G X 4 MM MISC USE TWICE A DAY TO TAKE INSULIN. 100 each 2   No current facility-administered medications for this visit.      Past Surgical History:  Procedure Laterality Date  . ABDOMINAL HYSTERECTOMY  2006  . BI-VENTRICULAR IMPLANTABLE CARDIOVERTER DEFIBRILLATOR  (CRT-D)  11/18/2014  . COLONOSCOPY  2008  . EP IMPLANTABLE DEVICE N/A 11/18/2014   Procedure: BiV ICD Insertion CRT-D;  Surgeon: Evans Lance, MD;  Location: [facility];  Service:  Cardiovascular;  Laterality: N/A;  . HERNIA REPAIR Right   . TONSILLECTOMY       Allergies  Allergen Reactions  . Codeine Other (See Comments)    Reaction:  Racing heart   . Decongestant [Pseudoephedrine Hcl Er] Other (See Comments)    Reaction:  Racing heart       Family History  Problem Relation Age of Onset  . Diabetes Mother   . Kidney disease Mother   . Diabetes Sister   . Heart attack Brother      Social History Donna [name] reports that she has never smoked. She has never used smokeless tobacco. Donna [name] reports that she does not drink alcohol.   Review of Systems CONSTITUTIONAL: +fatigue HEENT: Eyes: No visual loss, blurred vision, double vision or yellow sclerae.No hearing loss, sneezing, congestion, runny nose or sore throat.  SKIN: No rash or itching.  CARDIOVASCULAR: per HPI RESPIRATORY: No shortness of breath, cough or sputum.  GASTROINTESTINAL: No anorexia, nausea, vomiting or diarrhea. No abdominal pain or blood.  GENITOURINARY: No burning on urination, no polyuria NEUROLOGICAL: +dizziness MUSCULOSKELETAL: No muscle, back pain, joint pain or stiffness.  LYMPHATICS: No enlarged nodes. No history of splenectomy.  PSYCHIATRIC: No history of depression or anxiety.  ENDOCRINOLOGIC: No reports of sweating, cold or heat intolerance. No polyuria or polydipsia.  Marland Kitchen   Physical Examination Vitals:   02/23/16 1306  BP: (!) 108/58  Pulse: 69   Vitals:   02/23/16 1306  Weight: 120 lb (54.4 kg)  Height: 5\' 1"  (1.549 m)    Gen: resting comfortably, no acute distress HEENT: no scleral icterus, pupils equal round and reactive, no palptable cervical adenopathy,  CV: RRR, no m/r/g, no jvd Resp: Clear to auscultation bilaterally GI: abdomen is soft, non-tender, non-distended, normal bowel sounds, no hepatosplenomegaly MSK: extremities are warm, no edema.  Skin: warm, no rash Neuro:  no focal deficits Psych: appropriate affect   Diagnostic  Studies 06/2011 Echo  LVEF 20%, mildly dilated, mild LVH, grade I diastolic dysfunction, mild to mod MR, PASP 36   10/04/13 Echo  Study Conclusions  - Left ventricle: Left ventricular systolic function is severely reduced, EF 15-20%. Severe global hypokinesis to akinesis is seen. The cavity size was moderately to severely dilated. Wall thickness was increased in a pattern of mild LVH. Doppler parameters are consistent with abnormal left ventricular relaxation (grade 1 diastolic dysfunction). Doppler parameters are consistent with high ventricular filling pressure. - Regional wall motion abnormality: Akinesis of the mid anterior, mid anteroseptal, basal-mid inferoseptal, apical septal, and apical myocardium; severe hypokinesis of the mid inferior and apical lateral myocardium; moderate hypokinesis of the mid inferolateral and basal-mid anterolateral myocardium. -  Aortic valve: Mildly calcified annulus. Mildly thickened leaflets. - Mitral valve: Mildly dilated annulus. Mildly thickened leaflets . Mild to moderate regurgitation. - Left atrium: The atrium was moderately dilated. - Tricuspid valve: Mild regurgitation. - Pulmonary arteries: PA peak pressure: 31mm Hg (S). Mildly elevated pulmonary pressures. - Systemic veins: IVC is dilated, with normal respiratory variation. Estimated CVP 8 mmHg. - Pericardium, extracardiac: A trivial pericardial effusion was identified posterior to the heart.  10/2014 Echo Study Conclusions  - Left ventricle: There is global hypokinesis with akinesis of the   anterior, inferior walls and paradoxical septal motion. Overall   LVEF is severely refused estimated at 15-20%. The cavity size was   mildly dilated. There was moderate concentric hypertrophy.   Systolic function was severely reduced. The estimated ejection   fraction was in the range of 15-20%. Features are consistent with   a pseudonormal left ventricular filling pattern, with  concomitant   abnormal relaxation and increased filling pressure (grade 2   diastolic dysfunction). Doppler parameters are consistent with   elevated ventricular end-diastolic filling pressure. - Ventricular septum: Septal motion showed paradox. - Aortic valve: Trileaflet; normal thickness leaflets. There was no   regurgitation. - Aortic root: The aortic root was normal in size. - Mitral valve: Structurally normal valve. There was moderate   regurgitation. - Left atrium: The atrium was normal in size. - Right ventricle: Systolic function was mildly reduced. - Right atrium: The atrium was normal in size. - Tricuspid valve: There was mild regurgitation. - Pulmonary arteries: Systolic pressure was within the normal   range. PA peak pressure: 34 mm Hg (S). - Inferior vena cava: The vessel was normal in size. - Pericardium, extracardiac: A trivial pericardial effusion was   identified.  Assessment and Plan  1. NICM/Chronic systolic HF  - no recent chf symptoms. Ongoing fatigue and dizziness, unclear if medication related. She has had some soft bp's in clinic at times. We will continue to downtitrate meds and follow symptoms. Lower losartan to 12.5mg  and aldactone to 12.5mg  daily.   2. Ventricular tachycardia  - per EP, she has BiV AICD  3. Hyperlipidemia - continue statin - repeat labs  4. Fatigue - unclear cause - med changes as described above. Recheck labs.     F/u 2 months      Arnoldo Lenis, M.D., F.A.C.C.

## 2016-02-23 NOTE — Patient Instructions (Signed)
Medication Instructions:  DECREASE LOSARTAN TO 12.5 MG ONCE DAILY  DECREASE ALDACTONE TO 12.5 MG DAILY   Labwork: Your physician recommends that you return for lab work in:  [place]  TSH    Testing/Procedures: NONE  Follow-Up: Your physician recommends that you schedule a follow-up appointment in: 2 MONTHS    Any Other Special Instructions Will Be Listed Below (If Applicable).     If you need a refill on your cardiac medications before your next appointment, please call your pharmacy.

## 2016-02-24 ENCOUNTER — Other Ambulatory Visit: Payer: Self-pay | Admitting: Cardiology

## 2016-02-25 LAB — CBC WITH DIFFERENTIAL/PLATELET
BASOS ABS: 0 10*3/uL (ref 0.0–0.2)
BASOS: 0 %
EOS (ABSOLUTE): 0.1 10*3/uL (ref 0.0–0.4)
EOS: 1 %
HEMATOCRIT: 33.3 % — AB (ref 34.0–46.6)
Hemoglobin: 11.2 g/dL (ref 11.1–15.9)
Immature Grans (Abs): 0 10*3/uL (ref 0.0–0.1)
Immature Granulocytes: 0 %
LYMPHS ABS: 1.7 10*3/uL (ref 0.7–3.1)
Lymphs: 25 %
MCH: 31.8 pg (ref 26.6–33.0)
MCHC: 33.6 g/dL (ref 31.5–35.7)
MCV: 95 fL (ref 79–97)
MONOS ABS: 0.6 10*3/uL (ref 0.1–0.9)
Monocytes: 8 %
NEUTROS ABS: 4.4 10*3/uL (ref 1.4–7.0)
Neutrophils: 66 %
PLATELETS: 202 10*3/uL (ref 150–379)
RBC: 3.52 x10E6/uL — AB (ref 3.77–5.28)
RDW: 15 % (ref 12.3–15.4)
WBC: 6.8 10*3/uL (ref 3.4–10.8)

## 2016-02-25 LAB — COMPREHENSIVE METABOLIC PANEL
ALK PHOS: 50 IU/L (ref 39–117)
ALT: 13 IU/L (ref 0–32)
AST: 19 IU/L (ref 0–40)
Albumin/Globulin Ratio: 1.9 (ref 1.2–2.2)
Albumin: 4.4 g/dL (ref 3.5–4.8)
BUN/Creatinine Ratio: 24 (ref 12–28)
BUN: 18 mg/dL (ref 8–27)
Bilirubin Total: 0.5 mg/dL (ref 0.0–1.2)
CO2: 27 mmol/L (ref 18–29)
CREATININE: 0.74 mg/dL (ref 0.57–1.00)
Calcium: 9.4 mg/dL (ref 8.7–10.3)
Chloride: 99 mmol/L (ref 96–106)
GFR calc Af Amer: 91 mL/min/{1.73_m2} (ref >59)
GFR calc non Af Amer: 79 mL/min/{1.73_m2} (ref >59)
GLOBULIN, TOTAL: 2.3 g/dL (ref 1.5–4.5)
GLUCOSE: 98 mg/dL (ref 65–99)
Potassium: 3.9 mmol/L (ref 3.5–5.2)
SODIUM: 142 mmol/L (ref 134–144)
Total Protein: 6.7 g/dL (ref 6.0–8.5)

## 2016-02-25 LAB — LIPID PANEL W/O CHOL/HDL RATIO
CHOLESTEROL TOTAL: 113 mg/dL (ref 100–199)
HDL: 36 mg/dL — ABNORMAL LOW (ref >39)
LDL CALC: 60 mg/dL (ref 0–99)
TRIGLYCERIDES: 83 mg/dL (ref 0–149)
VLDL Cholesterol Cal: 17 mg/dL (ref 5–40)

## 2016-02-25 LAB — TSH: TSH: 4.8 u[IU]/mL — ABNORMAL HIGH (ref 0.450–4.500)

## 2016-02-25 LAB — MAGNESIUM: Magnesium: 2.3 mg/dL (ref 1.6–2.3)

## 2016-02-25 NOTE — Patient Outreach (Signed)
[facility]) Care Management  02/22/2016  Donna [name] 05/05/1941 [id number]  Unable to reach Donna [name] by phone this week for continued transition of care assessments. I left a HIPPA compliant voice message requesting return call.   Plan: I will reach out to Donna [name] in the next 7 days.    [place] [facility]  [phone]

## 2016-02-26 ENCOUNTER — Encounter (HOSPITAL_COMMUNITY): Payer: Self-pay | Admitting: *Deleted

## 2016-02-26 ENCOUNTER — Observation Stay (HOSPITAL_COMMUNITY)
Admission: EM | Admit: 2016-02-26 | Discharge: 2016-02-27 | Disposition: A | Discharge status: Home / Self Care | Payer: PPO | Attending: Pulmonary Disease | Admitting: Pulmonary Disease

## 2016-02-26 ENCOUNTER — Observation Stay (HOSPITAL_BASED_OUTPATIENT_CLINIC_OR_DEPARTMENT_OTHER): Payer: PPO

## 2016-02-26 ENCOUNTER — Emergency Department (HOSPITAL_COMMUNITY): Payer: PPO

## 2016-02-26 DIAGNOSIS — I509 Heart failure, unspecified: Secondary | ICD-10-CM

## 2016-02-26 DIAGNOSIS — Z79899 Other long term (current) drug therapy: Secondary | ICD-10-CM | POA: Diagnosis not present

## 2016-02-26 DIAGNOSIS — I110 Hypertensive heart disease with heart failure: Secondary | ICD-10-CM | POA: Diagnosis not present

## 2016-02-26 DIAGNOSIS — R0902 Hypoxemia: Secondary | ICD-10-CM

## 2016-02-26 DIAGNOSIS — I5022 Chronic systolic (congestive) heart failure: Secondary | ICD-10-CM | POA: Insufficient documentation

## 2016-02-26 DIAGNOSIS — I5043 Acute on chronic combined systolic (congestive) and diastolic (congestive) heart failure: Secondary | ICD-10-CM | POA: Diagnosis not present

## 2016-02-26 DIAGNOSIS — E039 Hypothyroidism, unspecified: Secondary | ICD-10-CM | POA: Diagnosis not present

## 2016-02-26 DIAGNOSIS — Z7982 Long term (current) use of aspirin: Secondary | ICD-10-CM | POA: Diagnosis not present

## 2016-02-26 DIAGNOSIS — I428 Other cardiomyopathies: Secondary | ICD-10-CM

## 2016-02-26 DIAGNOSIS — I429 Cardiomyopathy, unspecified: Secondary | ICD-10-CM

## 2016-02-26 DIAGNOSIS — J9601 Acute respiratory failure with hypoxia: Secondary | ICD-10-CM | POA: Diagnosis present

## 2016-02-26 DIAGNOSIS — Z794 Long term (current) use of insulin: Secondary | ICD-10-CM | POA: Diagnosis not present

## 2016-02-26 DIAGNOSIS — R0602 Shortness of breath: Secondary | ICD-10-CM | POA: Diagnosis present

## 2016-02-26 DIAGNOSIS — E119 Type 2 diabetes mellitus without complications: Secondary | ICD-10-CM | POA: Insufficient documentation

## 2016-02-26 DIAGNOSIS — N390 Urinary tract infection, site not specified: Secondary | ICD-10-CM | POA: Insufficient documentation

## 2016-02-26 DIAGNOSIS — R0600 Dyspnea, unspecified: Secondary | ICD-10-CM | POA: Diagnosis not present

## 2016-02-26 DIAGNOSIS — I5023 Acute on chronic systolic (congestive) heart failure: Secondary | ICD-10-CM

## 2016-02-26 LAB — ECHOCARDIOGRAM COMPLETE
CHL CUP MV DEC (S): 313
E decel time: 313 msec
EERAT: 16.07
FS: 13 % — AB (ref 28–44)
IV/PV OW: 0.79
LA vol A4C: 40.4 ml
LASIZE: 39 mm
LEFT ATRIUM END SYS DIAM: 39 mm
LV PW d: 12.6 mm — AB (ref 0.6–1.1)
LV TDI E'LATERAL: 4.5
LVEEAVG: 16.07
LVEEMED: 16.07
LVELAT: 4.5 cm/s
MV Peak grad: 2 mmHg
MV pk E vel: 72.3 m/s
MVPKAVEL: 32 m/s
TAPSE: 19.8 mm
TDI e' medial: 4.2
VTI: 184 cm
Weight: 1910.4 oz

## 2016-02-26 LAB — URINALYSIS, ROUTINE W REFLEX MICROSCOPIC
BILIRUBIN URINE: NEGATIVE
Glucose, UA: NEGATIVE mg/dL
HGB URINE DIPSTICK: NEGATIVE
KETONES UR: NEGATIVE mg/dL
NITRITE: NEGATIVE
PROTEIN: NEGATIVE mg/dL
Specific Gravity, Urine: 1.01 (ref 1.005–1.030)
pH: 5.5 (ref 5.0–8.0)

## 2016-02-26 LAB — COMPREHENSIVE METABOLIC PANEL
ALK PHOS: 58 U/L (ref 38–126)
ALT: 19 U/L (ref 14–54)
ANION GAP: 3 — AB (ref 5–15)
AST: 24 U/L (ref 15–41)
Albumin: 4.6 g/dL (ref 3.5–5.0)
BUN: 19 mg/dL (ref 6–20)
CALCIUM: 8.9 mg/dL (ref 8.9–10.3)
CO2: 27 mmol/L (ref 22–32)
CREATININE: 0.65 mg/dL (ref 0.44–1.00)
Chloride: 104 mmol/L (ref 101–111)
Glucose, Bld: 179 mg/dL — ABNORMAL HIGH (ref 65–99)
Potassium: 4.2 mmol/L (ref 3.5–5.1)
Sodium: 134 mmol/L — ABNORMAL LOW (ref 135–145)
Total Bilirubin: 0.5 mg/dL (ref 0.3–1.2)
Total Protein: 7.7 g/dL (ref 6.5–8.1)

## 2016-02-26 LAB — CBC WITH DIFFERENTIAL/PLATELET
Basophils Absolute: 0 10*3/uL (ref 0.0–0.1)
Basophils Relative: 0 %
EOS ABS: 0.1 10*3/uL (ref 0.0–0.7)
Eosinophils Relative: 1 %
HEMATOCRIT: 37.7 % (ref 36.0–46.0)
HEMOGLOBIN: 12.3 g/dL (ref 12.0–15.0)
LYMPHS ABS: 4 10*3/uL (ref 0.7–4.0)
Lymphocytes Relative: 35 %
MCH: 32.2 pg (ref 26.0–34.0)
MCHC: 32.6 g/dL (ref 30.0–36.0)
MCV: 98.7 fL (ref 78.0–100.0)
MONO ABS: 0.6 10*3/uL (ref 0.1–1.0)
MONOS PCT: 5 %
NEUTROS PCT: 59 %
Neutro Abs: 6.7 10*3/uL (ref 1.7–7.7)
Platelets: 215 10*3/uL (ref 150–400)
RBC: 3.82 MIL/uL — ABNORMAL LOW (ref 3.87–5.11)
RDW: 13.9 % (ref 11.5–15.5)
WBC: 11.5 10*3/uL — ABNORMAL HIGH (ref 4.0–10.5)

## 2016-02-26 LAB — CBC
HEMATOCRIT: 35.7 % — AB (ref 36.0–46.0)
Hemoglobin: 11.6 g/dL — ABNORMAL LOW (ref 12.0–15.0)
MCH: 31.8 pg (ref 26.0–34.0)
MCHC: 32.5 g/dL (ref 30.0–36.0)
MCV: 97.8 fL (ref 78.0–100.0)
PLATELETS: 179 10*3/uL (ref 150–400)
RBC: 3.65 MIL/uL — AB (ref 3.87–5.11)
RDW: 13.7 % (ref 11.5–15.5)
WBC: 9.3 10*3/uL (ref 4.0–10.5)

## 2016-02-26 LAB — TROPONIN I
TROPONIN I: 0.04 ng/mL — AB (ref <0.03)
TROPONIN I: 0.04 ng/mL — AB (ref <0.03)
TROPONIN I: 0.06 ng/mL — AB (ref <0.03)
Troponin I: 0.05 ng/mL (ref <0.03)

## 2016-02-26 LAB — BRAIN NATRIURETIC PEPTIDE: B NATRIURETIC PEPTIDE 5: 785 pg/mL — AB (ref 0.0–100.0)

## 2016-02-26 LAB — URINE MICROSCOPIC-ADD ON

## 2016-02-26 LAB — GLUCOSE, CAPILLARY
GLUCOSE-CAPILLARY: 139 mg/dL — AB (ref 65–99)
GLUCOSE-CAPILLARY: 97 mg/dL (ref 65–99)
Glucose-Capillary: 186 mg/dL — ABNORMAL HIGH (ref 65–99)
Glucose-Capillary: 150 mg/dL — ABNORMAL HIGH (ref 65–99)

## 2016-02-26 LAB — CBG MONITORING, ED: GLUCOSE-CAPILLARY: 176 mg/dL — AB (ref 65–99)

## 2016-02-26 LAB — CREATININE, SERUM: Creatinine, Ser: 0.63 mg/dL (ref 0.44–1.00)

## 2016-02-26 MED ORDER — SODIUM CHLORIDE 0.9 % IV SOLN: 250.0000 mL | INTRAVENOUS | Status: DC | PRN

## 2016-02-26 MED ORDER — CARVEDILOL 3.125 MG PO TABS
3.1250 mg | ORAL_TABLET | Freq: Two times a day (BID) | ORAL | Status: DC
  Administered 2016-02-26 – 2016-02-27 (×3): 3.125 mg via ORAL
  Filled 2016-02-26 (×3): qty 1

## 2016-02-26 MED ORDER — NITROGLYCERIN 2 % TD OINT
1.0000 [in_us] | TOPICAL_OINTMENT | Freq: Once | TRANSDERMAL | Status: AC
  Administered 2016-02-26: 1 [in_us] via TOPICAL
  Filled 2016-02-26: qty 1

## 2016-02-26 MED ORDER — INSULIN DETEMIR 100 UNIT/ML ~~LOC~~ SOLN
12.0000 [IU] | Freq: Every day | SUBCUTANEOUS | Status: DC
  Administered 2016-02-27: 12 [IU] via SUBCUTANEOUS
  Filled 2016-02-26 (×3): qty 0.12

## 2016-02-26 MED ORDER — IPRATROPIUM BROMIDE 0.02 % IN SOLN
0.5000 mg | Freq: Once | RESPIRATORY_TRACT | Status: AC
  Administered 2016-02-26: 0.5 mg via RESPIRATORY_TRACT
  Filled 2016-02-26: qty 2.5

## 2016-02-26 MED ORDER — ACETAMINOPHEN 325 MG PO TABS
650.0000 mg | ORAL_TABLET | ORAL | Status: DC | PRN
  Administered 2016-02-26: 650 mg via ORAL
  Filled 2016-02-26: qty 2

## 2016-02-26 MED ORDER — FUROSEMIDE 10 MG/ML IJ SOLN
60.0000 mg | Freq: Once | INTRAMUSCULAR | Status: AC
  Administered 2016-02-26: 60 mg via INTRAVENOUS
  Filled 2016-02-26: qty 6

## 2016-02-26 MED ORDER — SPIRONOLACTONE 25 MG PO TABS
12.5000 mg | ORAL_TABLET | Freq: Every day | ORAL | Status: DC
  Administered 2016-02-26 – 2016-02-27 (×2): 12.5 mg via ORAL
  Filled 2016-02-26 (×3): qty 1

## 2016-02-26 MED ORDER — POTASSIUM CHLORIDE 20 MEQ PO PACK
20.0000 meq | PACK | Freq: Two times a day (BID) | ORAL | Status: DC
  Administered 2016-02-26 – 2016-02-27 (×3): 20 meq via ORAL
  Filled 2016-02-26 (×3): qty 1

## 2016-02-26 MED ORDER — CALCIUM GLUCONATE 500 MG PO TABS: 500.0000 mg | ORAL_TABLET | Freq: Two times a day (BID) | ORAL | Status: DC

## 2016-02-26 MED ORDER — FUROSEMIDE 10 MG/ML IJ SOLN
20.0000 mg | Freq: Two times a day (BID) | INTRAMUSCULAR | Status: DC
Start: 2016-02-26 — End: 2016-02-27
  Administered 2016-02-26 – 2016-02-27 (×2): 20 mg via INTRAVENOUS
  Filled 2016-02-26 (×2): qty 2

## 2016-02-26 MED ORDER — ALBUTEROL SULFATE (2.5 MG/3ML) 0.083% IN NEBU
5.0000 mg | INHALATION_SOLUTION | Freq: Once | RESPIRATORY_TRACT | Status: AC
  Administered 2016-02-26: 5 mg via RESPIRATORY_TRACT
  Filled 2016-02-26: qty 6

## 2016-02-26 MED ORDER — INSULIN ASPART 100 UNIT/ML ~~LOC~~ SOLN
0.0000 [IU] | Freq: Three times a day (TID) | SUBCUTANEOUS | Status: DC
  Administered 2016-02-26: 2 [IU] via SUBCUTANEOUS

## 2016-02-26 MED ORDER — ASPIRIN EC 81 MG PO TBEC
81.0000 mg | DELAYED_RELEASE_TABLET | Freq: Every day | ORAL | Status: DC
  Administered 2016-02-26 – 2016-02-27 (×2): 81 mg via ORAL
  Filled 2016-02-26 (×3): qty 1

## 2016-02-26 MED ORDER — SODIUM CHLORIDE 0.9% FLUSH: 3.0000 mL | INTRAVENOUS | Status: DC | PRN

## 2016-02-26 MED ORDER — LEVOTHYROXINE SODIUM 75 MCG PO TABS
75.0000 ug | ORAL_TABLET | Freq: Every day | ORAL | Status: DC
  Administered 2016-02-26 – 2016-02-27 (×2): 75 ug via ORAL
  Filled 2016-02-26 (×3): qty 1

## 2016-02-26 MED ORDER — LOSARTAN POTASSIUM 25 MG PO TABS
12.5000 mg | ORAL_TABLET | Freq: Every day | ORAL | Status: DC
Start: 2016-02-26 — End: 2016-02-27
  Administered 2016-02-26 – 2016-02-27 (×2): 12.5 mg via ORAL
  Filled 2016-02-26 (×4): qty 0.5

## 2016-02-26 MED ORDER — ORAL CARE MOUTH RINSE
15.0000 mL | Freq: Two times a day (BID) | OROMUCOSAL | Status: DC
  Administered 2016-02-26 – 2016-02-27 (×3): 15 mL via OROMUCOSAL

## 2016-02-26 MED ORDER — ONDANSETRON HCL 4 MG/2ML IJ SOLN: 4.0000 mg | Freq: Four times a day (QID) | INTRAMUSCULAR | Status: DC | PRN

## 2016-02-26 MED ORDER — DEXTROSE 5 % IV SOLN
1.0000 g | Freq: Once | INTRAVENOUS | Status: AC
  Administered 2016-02-26: 1 g via INTRAVENOUS
  Filled 2016-02-26: qty 10

## 2016-02-26 MED ORDER — ATORVASTATIN CALCIUM 20 MG PO TABS
20.0000 mg | ORAL_TABLET | Freq: Every day | ORAL | Status: DC
  Administered 2016-02-26: 20 mg via ORAL
  Filled 2016-02-26: qty 1

## 2016-02-26 MED ORDER — ENOXAPARIN SODIUM 40 MG/0.4ML ~~LOC~~ SOLN
40.0000 mg | SUBCUTANEOUS | Status: DC
  Administered 2016-02-26: 40 mg via SUBCUTANEOUS
  Filled 2016-02-26 (×3): qty 0.4

## 2016-02-26 MED ORDER — SODIUM CHLORIDE 0.9% FLUSH
3.0000 mL | Freq: Two times a day (BID) | INTRAVENOUS | Status: DC
  Administered 2016-02-26 – 2016-02-27 (×3): 3 mL via INTRAVENOUS

## 2016-02-26 NOTE — ED Provider Notes (Signed)
[facility] DEPT Provider Note   CSN: [id number] Arrival date & time: 02/26/16  0242  Time seen on arrival at 02:40 AM   History   Chief Complaint Chief Complaint  Patient presents with  . Shortness of Breath    Level V caveat due to respiratory distress  HPI Donna [name] is a 75 y.o. female.  HPI  patient was brought in by EMS for possible code stroke. She was last seen normal at 11 PM when she went to bed. She woke up at 2 AM and was very short of breath. EMS reports her pulse ox was 90% on room air. She denies any swelling of her extremities, chest pain, or cough. She denies been on oxygen at home. EMS thought her speech seemed a little bit slurred.  My preliminary neurological exam was without acute focal deficit and her speech is normal.  PCP Dr Luan Pulling Cardiology Dr Harl Bowie  Past Medical History:  Diagnosis Date  . AICD (automatic cardioverter/defibrillator) present   . Arthritis    "fingers" (11/18/2014)  . Atrial flutter (HCC)    Versus ventricular tachycardia; treated with amiodarone  . Cardiomyopathy, nonischemic ([place])    Nl cors in [place]; CHF in 12/97; EF of 40% in 5/01 and 7/03, 25% in 9/04 and 4/08.  systolic murmur without valvular abnormalities by echo; refused automatic implantable cardiac defibrillator  . Cerebrovascular disease    Duplex study in 12/2006 shows atherosclerosis without focal stenosis  . CHF (congestive heart failure) ([place])   . Diabetes mellitus, type II ([place])    No insulin  . Gastroesophageal reflux disease   . Hyperlipidemia   . Hypertension   . Left bundle branch block   . Ventricular tachycardia ([place])    a. PMH it lists "atrial flutter versus ventricular tachycardia treated with amiodarone" - details of this are unclear. Notes from back to 2006 indicate she has been maintained on amiodarone but do not describe further indication. Patient's sister reports pt was told she had skipped beats that could cause her to drop dead -  Dr. Lattie Haw put her on amiodarone and recommended a defibrillator.    Patient Active Problem List   Diagnosis Date Noted  . Dizziness 02/03/2016  . Fall on flat surface, tripped on rug; no injury 01/19/2016    Class: Acute  . Acute on chronic systolic CHF (congestive heart failure) ([place]) 12/29/2015  . Hypokalemia 05/18/2015  . Acute on chronic systolic congestive heart failure ([place]) 05/16/2015  . Acute on chronic systolic (congestive) heart failure ([place]) 05/16/2015  . Chronic systolic heart failure ([place]) 11/18/2014  . NICM (nonischemic cardiomyopathy) ([place]) 11/18/2014  . Acute on chronic systolic heart failure ([place]) 10/20/2014  . Acute respiratory failure with hypoxia ([place]) 10/18/2014  . CHF exacerbation ([place]) 07/22/2014  . Acute exacerbation of CHF (congestive heart failure) ([place]) 07/22/2014  . Abnormality of gait 09/26/2013  . Weakness of left hip 09/26/2013  . Diabetes mellitus, type II ([place]) 06/15/2011  . Cardiomyopathy, nonischemic ([place])   . Hypertension   . Hypothyroidism 05/28/2009  . Left bundle branch block 05/28/2009  . GASTROESOPHAGEAL REFLUX DISEASE 05/28/2009  . Hyperlipidemia 05/14/2008  . VENTRICULAR TACHYCARDIA 05/14/2008  . CEREBROVASCULAR DISEASE 05/14/2008    Past Surgical History:  Procedure Laterality Date  . ABDOMINAL HYSTERECTOMY  2006  . BI-VENTRICULAR IMPLANTABLE CARDIOVERTER DEFIBRILLATOR  (CRT-D)  11/18/2014  . COLONOSCOPY  2008  . EP IMPLANTABLE DEVICE N/A 11/18/2014   Procedure: BiV ICD Insertion CRT-D;  Surgeon: Evans Lance,  MD;  Location: [facility];  Service: Cardiovascular;  Laterality: N/A;  . HERNIA REPAIR Right   . TONSILLECTOMY      OB History    Gravida Para Term Preterm AB Living             2   SAB TAB Ectopic Multiple Live Births                   Home Medications    Prior to Admission medications   Medication Sig Start Date End Date Taking? Authorizing Provider  acetaminophen (TYLENOL) 500 MG tablet Take 500 mg  by mouth every 6 (six) hours as needed.    Historical Provider, MD  aspirin 81 MG tablet Take 81 mg by mouth daily.    Historical Provider, MD  atorvastatin (LIPITOR) 20 MG tablet Take 1 tablet (20 mg total) by mouth daily at 6 PM. 07/24/14   Sinda Du, MD  calcium gluconate 500 MG tablet Take 500 mg by mouth 2 (two) times daily.      Historical Provider, MD  carvedilol (COREG) 3.125 MG tablet TAKE ONE TABLET TWICE DAILY 05/15/15   Evans Lance, MD  fish oil-omega-3 fatty acids 1000 MG capsule Take 1 capsule by mouth 3 (three) times daily.      Historical Provider, MD  furosemide (LASIX) 40 MG tablet Take 40 mg by mouth 2 (two) times daily. Reported on 08/26/2015    Historical Provider, MD  insulin detemir (LEVEMIR) 100 UNIT/ML injection Inject 12 Units into the skin at bedtime.    Sinda Du, MD  levothyroxine (SYNTHROID, LEVOTHROID) 75 MCG tablet Take 75 mcg by mouth daily before breakfast.    Historical Provider, MD  linagliptin (TRADJENTA) 5 MG TABS tablet Take 5 mg by mouth daily.    Historical Provider, MD  losartan (COZAAR) 25 MG tablet Take 0.5 tablets (12.5 mg total) by mouth daily. 02/23/16 05/23/16  Arnoldo Lenis, MD  omeprazole (PRILOSEC) 20 MG capsule Take 20 mg by mouth 2 (two) times daily.      Historical Provider, MD  potassium chloride (KLOR-CON) 20 MEQ packet Take 20 mEq by mouth 2 (two) times daily. 07/24/15   Lendon Colonel, NP  spironolactone (ALDACTONE) 25 MG tablet Take 12.5 mg by mouth daily.      Historical Provider, MD  UNIFINE PENTIPS 32G X 4 MM MISC USE TWICE A DAY TO TAKE INSULIN. 01/06/16   Cassandria Anger, MD    Family History Family History  Problem Relation Age of Onset  . Diabetes Mother   . Kidney disease Mother   . Diabetes Sister   . Heart attack Brother     Social History Social History  Substance Use Topics  . Smoking status: Never Smoker  . Smokeless tobacco: Never Used  . Alcohol use No  Lives at home Lives with  grandson   Allergies   Codeine and Decongestant [pseudoephedrine hcl er]   Review of Systems Review of Systems  Unable to perform ROS: Severe respiratory distress     Physical Exam Updated Vital Signs BP 119/70   Pulse 71   Temp 98.2 F (36.8 C) (Rectal)   Resp 25   SpO2 97%   Initial BP was 142/128, pulse ox 70% on [place] oxygen  Vital signs normal except for tachypnea and hypoxia and hypertension    Physical Exam  Constitutional: She is oriented to person, place, and time.  Non-toxic appearance. She does not appear ill. She appears  distressed.  Frail elderly female  HENT:  Head: Normocephalic and atraumatic.  Right Ear: External ear normal.  Left Ear: External ear normal.  Nose: Nose normal. No mucosal edema or rhinorrhea.  Mouth/Throat: Oropharynx is clear and moist and mucous membranes are normal. No dental abscesses or uvula swelling.  Eyes: Conjunctivae and EOM are normal. Pupils are equal, round, and reactive to light.  Neck: Normal range of motion and full passive range of motion without pain. Neck supple.  Cardiovascular: Normal rate, regular rhythm and normal heart sounds.  Exam reveals no gallop and no friction rub.   No murmur heard. Pulmonary/Chest: Accessory muscle usage present. Tachypnea noted. She is in respiratory distress. She has decreased breath sounds. She has no wheezes. She has no rhonchi. She has rales. She exhibits no tenderness and no crepitus.  Pt has audible rales, abdominal breathing  Abdominal: Soft. Normal appearance and bowel sounds are normal. She exhibits no distension. There is no tenderness. There is no rebound and no guarding.  Musculoskeletal: Normal range of motion. She exhibits no edema or tenderness.  Moves all extremities well. Trace edema of LE  Neurological: She is alert and oriented to person, place, and time. She has normal strength. No cranial nerve deficit.  Patient scripts are equal, her face is symmetrical, her speech is  clear and normal, she has no lower motor extremity weakness. She has no pronator drift.  Skin: Skin is warm, dry and intact. No rash noted. No erythema. No pallor.  Psychiatric: She has a normal mood and affect. Her speech is normal and behavior is normal. Her mood appears not anxious.  Nursing note and vitals reviewed.    ED Treatments / Results  Labs (all labs ordered are listed, but only abnormal results are displayed) Results for orders placed or performed during the hospital encounter of 02/26/16  CBC with Differential  Result Value Ref Range   WBC 11.5 (H) 4.0 - 10.5 K/uL   RBC 3.82 (L) 3.87 - 5.11 MIL/uL   Hemoglobin 12.3 12.0 - 15.0 g/dL   HCT 37.7 36.0 - 46.0 %   MCV 98.7 78.0 - 100.0 fL   MCH 32.2 26.0 - 34.0 pg   MCHC 32.6 30.0 - 36.0 g/dL   RDW 13.9 11.5 - 15.5 %   Platelets 215 150 - 400 K/uL   Neutrophils Relative % 59 %   Neutro Abs 6.7 1.7 - 7.7 K/uL   Lymphocytes Relative 35 %   Lymphs Abs 4.0 0.7 - 4.0 K/uL   Monocytes Relative 5 %   Monocytes Absolute 0.6 0.1 - 1.0 K/uL   Eosinophils Relative 1 %   Eosinophils Absolute 0.1 0.0 - 0.7 K/uL   Basophils Relative 0 %   Basophils Absolute 0.0 0.0 - 0.1 K/uL  Brain natriuretic peptide  Result Value Ref Range   B Natriuretic Peptide 785.0 (H) 0.0 - 100.0 pg/mL  Comprehensive metabolic panel  Result Value Ref Range   Sodium 134 (L) 135 - 145 mmol/L   Potassium 4.2 3.5 - 5.1 mmol/L   Chloride 104 101 - 111 mmol/L   CO2 27 22 - 32 mmol/L   Glucose, Bld 179 (H) 65 - 99 mg/dL   BUN 19 6 - 20 mg/dL   Creatinine, Ser 0.65 0.44 - 1.00 mg/dL   Calcium 8.9 8.9 - 10.3 mg/dL   Total Protein 7.7 6.5 - 8.1 g/dL   Albumin 4.6 3.5 - 5.0 g/dL   AST 24 15 - 41 U/L  ALT 19 14 - 54 U/L   Alkaline Phosphatase 58 38 - 126 U/L   Total Bilirubin 0.5 0.3 - 1.2 mg/dL   GFR calc non Af Amer >60 >60 mL/min   GFR calc Af Amer >60 >60 mL/min   Anion gap 3 (L) 5 - 15  Troponin I  Result Value Ref Range   Troponin I 0.04 (HH)  <0.03 ng/mL  Urinalysis, Routine w reflex microscopic  Result Value Ref Range   Color, Urine YELLOW YELLOW   APPearance CLEAR CLEAR   Specific Gravity, Urine 1.010 1.005 - 1.030   pH 5.5 5.0 - 8.0   Glucose, UA NEGATIVE NEGATIVE mg/dL   Hgb urine dipstick NEGATIVE NEGATIVE   Bilirubin Urine NEGATIVE NEGATIVE   Ketones, ur NEGATIVE NEGATIVE mg/dL   Protein, ur NEGATIVE NEGATIVE mg/dL   Nitrite NEGATIVE NEGATIVE   Leukocytes, UA SMALL (A) NEGATIVE  Urine microscopic-add on  Result Value Ref Range   Squamous Epithelial / LPF 0-5 (A) NONE SEEN   WBC, UA 6-30 0 - 5 WBC/hpf   RBC / HPF 0-5 0 - 5 RBC/hpf   Bacteria, UA MANY (A) NONE SEEN  CBG monitoring, ED  Result Value Ref Range   Glucose-Capillary 176 (H) 65 - 99 mg/dL   Laboratory interpretation all normal except leukocytosis, chronic mildly + troponin, mildly elevated BNP    EKG  EKG Interpretation  Date/Time:  Friday February 26 2016 02:55:45 EDT Ventricular Rate:  93 PR Interval:    QRS Duration: 152 QT Interval:  424 QTC Calculation: 528 R Axis:   -63 Text Interpretation:  Atrial-sensed ventricular-paced rhythm No further analysis attempted due to paced rhythm No significant change since last tracing 29 Dec 2015 Confirmed by Kadeja Granada  MD-I, Tearah Saulsbury ([id number]) on 02/26/2016 3:40:41 AM       Radiology No results found.  Procedures Procedures (including critical care time)  Medications Ordered in ED Medications  cefTRIAXone (ROCEPHIN) 1 g in dextrose 5 % 50 mL IVPB (not administered)  furosemide (LASIX) injection 60 mg (60 mg Intravenous Given 02/26/16 0313)  nitroGLYCERIN (NITROGLYN) 2 % ointment 1 inch (1 inch Topical Given 02/26/16 0311)  albuterol (PROVENTIL) (2.5 MG/3ML) 0.083% nebulizer solution 5 mg (5 mg Nebulization Given 02/26/16 0420)  ipratropium (ATROVENT) nebulizer solution 0.5 mg (0.5 mg Nebulization Given 02/26/16 0420)     Initial Impression / Assessment and Plan / ED Course  I have reviewed the triage  vital signs and the nursing notes.  Pertinent labs & imaging results that were available during my care of the patient were reviewed by me and considered in my medical decision making (see chart for details).  Clinical Course   As patient was being placed on her stretcher she had a nasal cannula oxygen that was started by EMS. Her pulse ox was noted to drop to 70%. She was placed on a Ventimask and her oxygenation improved to 95-96% however she still had a lot of work of breathing. At 255 she was placed on BiPAP. She was given IV Lasix and nitroglycerin paste for suspected acute congestive heart failure. A Foley catheter was placed.  Recheck at 3:12 AM patient states she's feeling better. She has been on the BiPAP now for about 15 minutes. Her blood pressure is good. Her pulse ox is good.  03:33 AM I reviewed her CXR and it is c/w acute CHF  04:00 AM pt indicates she is feeling better, she is still on bipap. The rales are almost gone, now has  some rhonchi and rare wheeze. Albuterol nebulizer ordered.  She has had 800 cc of urine output. After reviewing her urinalysis she was given Rocephin for possible UTI. We discussed need for admission  04:41 AM Dr Darrick Meigs, admit to step-down, Dr Luan Pulling attending.  Final Clinical Impressions(s) / ED Diagnoses   Final diagnoses:  Acute on chronic congestive heart failure, unspecified congestive heart failure type [facility])  UTI (lower urinary tract infection)  Hypoxia    Plan admission  CRITICAL CARE Performed by: Ashaz Robling L Samirah Scarpati Total critical care time: 49 minutes Critical care time was exclusive of separately billable procedures and treating other patients. Critical care was necessary to treat or prevent imminent or life-threatening deterioration. Critical care was time spent personally by me on the following activities: development of treatment plan with patient and/or surrogate as well as nursing, discussions with consultants, evaluation of patient's  response to treatment, examination of patient, obtaining history from patient or surrogate, ordering and performing treatments and interventions, ordering and review of laboratory studies, ordering and review of radiographic studies, pulse oximetry and re-evaluation of patient's condition.     Rolland Porter, MD 02/26/16 [phone]

## 2016-02-26 NOTE — Progress Notes (Signed)
Device interrogation shows no significant tachyarrhythmias, does report volume thresholds have been elevated over the last week.   [name] MD

## 2016-02-26 NOTE — H&P (Signed)
TRH H&P   Patient Demographics:    Donna [name], is a 75 y.o. female  MRN: [id number]  DOB - 1941/05/12  Admit Date - 02/26/2016  Outpatient Primary MD for the patient is Alonza Bogus, MD  Referring MD/NP/PA: Dr Tomi Bamberger  Patient coming from: Home  Chief Complaint  Patient presents with  . Shortness of Breath      HPI:    Donna [name]  is a 75 y.o. female, *with  history of nonischemic cardio myopathy, ventricular tachycardia status post biventricular AICD, hyperlipidemia, diabetes mellitus who was brought by EMS to the hospital for shortness of breath. Patient woke up totally embedded shortness of breath, EMS reported the pulse ox was 90% on room air but in the ED patient's pulse ox dropped to 70s and was started on BiPAP. She denies chest pain, no leg swelling. No nausea vomiting or diarrhea. No cough  She was given 60 mg of Lasix IV 1 in the ED, Nitropaste 1 inch. Her breathing has improved.    Review of systems:      A full 10 point Review of Systems was done, except as stated above, all other Review of Systems were negative.   With Past History of the following :    Past Medical History:  Diagnosis Date  . AICD (automatic cardioverter/defibrillator) present   . Arthritis    "fingers" (11/18/2014)  . Atrial flutter (HCC)    Versus ventricular tachycardia; treated with amiodarone  . Cardiomyopathy, nonischemic ([place])    Nl cors in [place]; CHF in 12/97; EF of 40% in 5/01 and 7/03, 25% in 9/04 and 4/08.  systolic murmur without valvular abnormalities by echo; refused automatic implantable cardiac defibrillator  . Cerebrovascular disease    Duplex study in 12/2006 shows atherosclerosis without focal stenosis  . CHF (congestive heart failure) ([place])   . Diabetes mellitus, type II ([place])    No insulin  . Gastroesophageal reflux disease   . Hyperlipidemia   . Hypertension   .  Left bundle branch block   . Ventricular tachycardia ([place])    a. PMH it lists "atrial flutter versus ventricular tachycardia treated with amiodarone" - details of this are unclear. Notes from back to 2006 indicate she has been maintained on amiodarone but do not describe further indication. Patient's sister reports pt was told she had skipped beats that could cause her to drop dead - Dr. Lattie Haw put her on amiodarone and recommended a defibrillator.      Past Surgical History:  Procedure Laterality Date  . ABDOMINAL HYSTERECTOMY  2006  . BI-VENTRICULAR IMPLANTABLE CARDIOVERTER DEFIBRILLATOR  (CRT-D)  11/18/2014  . COLONOSCOPY  2008  . EP IMPLANTABLE DEVICE N/A 11/18/2014   Procedure: BiV ICD Insertion CRT-D;  Surgeon: Evans Lance, MD;  Location: [facility];  Service: Cardiovascular;  Laterality: N/A;  . HERNIA REPAIR Right   . TONSILLECTOMY        Social History:     Social History  Substance Use Topics  .  Smoking status: Never Smoker  . Smokeless tobacco: Never Used  . Alcohol use No        Family History :     Family History  Problem Relation Age of Onset  . Diabetes Mother   . Kidney disease Mother   . Diabetes Sister   . Heart attack Brother       Home Medications:   Prior to Admission medications   Medication Sig Start Date End Date Taking? Authorizing Provider  acetaminophen (TYLENOL) 500 MG tablet Take 500 mg by mouth every 6 (six) hours as needed.    Historical Provider, MD  aspirin 81 MG tablet Take 81 mg by mouth daily.    Historical Provider, MD  atorvastatin (LIPITOR) 20 MG tablet Take 1 tablet (20 mg total) by mouth daily at 6 PM. 07/24/14   Sinda Du, MD  calcium gluconate 500 MG tablet Take 500 mg by mouth 2 (two) times daily.      Historical Provider, MD  carvedilol (COREG) 3.125 MG tablet TAKE ONE TABLET TWICE DAILY 05/15/15   Evans Lance, MD  fish oil-omega-3 fatty acids 1000 MG capsule Take 1 capsule by mouth 3 (three) times daily.       Historical Provider, MD  furosemide (LASIX) 40 MG tablet Take 40 mg by mouth 2 (two) times daily. Reported on 08/26/2015    Historical Provider, MD  insulin detemir (LEVEMIR) 100 UNIT/ML injection Inject 12 Units into the skin at bedtime.    Sinda Du, MD  levothyroxine (SYNTHROID, LEVOTHROID) 75 MCG tablet Take 75 mcg by mouth daily before breakfast.    Historical Provider, MD  linagliptin (TRADJENTA) 5 MG TABS tablet Take 5 mg by mouth daily.    Historical Provider, MD  losartan (COZAAR) 25 MG tablet Take 0.5 tablets (12.5 mg total) by mouth daily. 02/23/16 05/23/16  Arnoldo Lenis, MD  omeprazole (PRILOSEC) 20 MG capsule Take 20 mg by mouth 2 (two) times daily.      Historical Provider, MD  potassium chloride (KLOR-CON) 20 MEQ packet Take 20 mEq by mouth 2 (two) times daily. 07/24/15   Lendon Colonel, NP  spironolactone (ALDACTONE) 25 MG tablet Take 12.5 mg by mouth daily.      Historical Provider, MD  UNIFINE PENTIPS 32G X 4 MM MISC USE TWICE A DAY TO TAKE INSULIN. 01/06/16   Cassandria Anger, MD     Allergies:     Allergies  Allergen Reactions  . Codeine Other (See Comments)    Reaction:  Racing heart   . Decongestant [Pseudoephedrine Hcl Er] Other (See Comments)    Reaction:  Racing heart      Physical Exam:   Vitals  Blood pressure (!) 104/54, pulse 64, temperature 98.2 F (36.8 C), temperature source Rectal, resp. rate 19, weight 54.4 kg (120 lb), SpO2 93 %.  1.  General Caucasian female on BiPAP  2. Psychiatric: Intact judgement and  insight, awake alert, oriented x 3.  3. Neurologic:No focal neurological deficits, all cranial nerves intact. Strength 5/5 all 4 extremities, sensation intact all 4 extremities, plantars down going.  4. Eyes show anicteric sclerae, moist conjunctivae with no lid lag. PERRLA.  5. ENMT: Oropharynx clear with moist mucous membranes and good dentition  5. Neck: supple, no cervical lymphadenopathy appriciated, No  thyromegaly  6. Respiratory : On BiPAP,  good air movement bilaterally, clear to auscultation bilaterally.  7. Cardiovascular :RRR, no gallops, rubs or murmurs, no leg edema  8. GI: Positive bowel  sounds, abdomen soft, no tenderness to palpation, no organomegaly appriciated,no rebound -guarding or rigidity.  9. Skin: No cyanosis, normal texture and turgor, no rash, lesions or ulcers  10.Musculoskeletal: Good muscle tone,  joints appear normal , no effusions, normal range of motion         Data Review:    CBC  Recent Labs Lab 02/24/16 0923 02/26/16 0245  WBC 6.8 11.5*  HGB  --  12.3  HCT 33.3* 37.7  PLT 202 215  MCV 95 98.7  MCH 31.8 32.2  MCHC 33.6 32.6  RDW 15.0 13.9  LYMPHSABS 1.7 4.0  MONOABS  --  0.6  EOSABS 0.1 0.1  BASOSABS 0.0 0.0   ------------------------------------------------------------------------------------------------------------------  Chemistries   Recent Labs Lab 02/24/16 0923 02/26/16 0245  NA 142 134*  K 3.9 4.2  CL 99 104  CO2 27 27  GLUCOSE 98 179*  BUN 18 19  CREATININE 0.74 0.65  CALCIUM 9.4 8.9  MG 2.3  --   AST 19 24  ALT 13 19  ALKPHOS 50 58  BILITOT 0.5 0.5   ------------------------------------------------------------------------------------------------------------------  ------------------------------------------------------------------------------------------------------------------ GFR: Estimated Creatinine Clearance: 45.1 mL/min (by C-G formula based on SCr of 0.65 mg/dL). Liver Function Tests:  Recent Labs Lab 02/24/16 0923 02/26/16 0245  AST 19 24  ALT 13 19  ALKPHOS 50 58  BILITOT 0.5 0.5  PROT 6.7 7.7  ALBUMIN 4.4 4.6   No results for input(s): LIPASE, AMYLASE in the last 168 hours. No results for input(s): AMMONIA in the last 168 hours. Coagulation Profile: No results for input(s): INR, PROTIME in the last 168 hours. Cardiac Enzymes:  Recent Labs Lab 02/26/16 0245  TROPONINI 0.04*   BNP  (last 3 results) No results for input(s): PROBNP in the last 8760 hours. HbA1C: No results for input(s): HGBA1C in the last 72 hours. CBG:  Recent Labs Lab 02/26/16 0246  GLUCAP 176*   Lipid Profile:  Recent Labs  02/24/16 0923  CHOL 113  HDL 36*  LDLCALC 60  TRIG 83   Thyroid Function Tests:  Recent Labs  02/24/16 0923  TSH 4.800*   Anemia Panel: No results for input(s): VITAMINB12, FOLATE, FERRITIN, TIBC, IRON, RETICCTPCT in the last 72 hours.  --------------------------------------------------------------------------------------------------------------- Urine analysis:    Component Value Date/Time   COLORURINE YELLOW 02/26/2016 [place] 02/26/2016 0314   LABSPEC 1.010 02/26/2016 0314   PHURINE 5.5 02/26/2016 0314   GLUCOSEU NEGATIVE 02/26/2016 0314   HGBUR NEGATIVE 02/26/2016 0314   BILIRUBINUR NEGATIVE 02/26/2016 0314   KETONESUR NEGATIVE 02/26/2016 0314   PROTEINUR NEGATIVE 02/26/2016 0314   NITRITE NEGATIVE 02/26/2016 0314   LEUKOCYTESUR SMALL (A) 02/26/2016 0314      ----------------------------------------------------------------------------------------------------------------   Imaging Results:    Dg Chest Port 1 View  Result Date: 02/26/2016 CLINICAL DATA:  Acute onset of shortness of breath and rales. Initial encounter. EXAM: PORTABLE CHEST 1 VIEW COMPARISON:  Chest radiograph performed 12/29/2015 FINDINGS: The lungs are well-aerated. Vascular congestion is noted. Patchy bilateral opacities may reflect mild interstitial edema. No pleural effusion or pneumothorax is seen. The cardiomediastinal silhouette is mildly enlarged. A pacemaker/AICD is noted overlying the left chest wall, with leads ending overlying the right atrium, right ventricle and coronary sinus. No acute osseous abnormalities are seen. IMPRESSION: Vascular congestion noted. Patchy bilateral airspace opacities may reflect mild interstitial edema. Mild cardiomegaly noted.  Electronically Signed   By: Garald Balding M.D.   On: 02/26/2016 04:40    My personal review of EKG:  Paced rhythm   Assessment & Plan:    Active Problems:   Hypothyroidism   Diabetes mellitus, type II ([place])   CHF exacerbation ([place])   Acute exacerbation of CHF (congestive heart failure) ([place])   Acute respiratory failure with hypoxia (HCC)   NICM (nonischemic cardiomyopathy) ([place])   1. Acute on chronic respiratory failure with hypoxia- secondary to CHF exacerbation, patient given 1 dose of Lasix 60 mg IV in the ED started on BiPAP. She diuresed well in the ED, her breathing has much improved. Will DC BiPAP, and start oxygen via nasal cannula. Start Lasix 20 mg IV every 12 hours 2. Diabetes mellitus- continue Lantus, start sliding scale insulin with NovoLog. 3. Hypothyroidism- continue Synthroid 4. Nonischemic cardiomyopathy- EF 15-20%, patient has both systolic and diastolic heart failure. She was recently seen by cardiology as outpatient. Continue Coreg, Cozaar 5. Hyperlipidemia- continue atorvastatin.   DVT Prophylaxis-   Lovenox   AM Labs Ordered, also please review Full Orders  Family Communication: No family at bedside   Code Status:  Full code  Admission status: Observation    Time spent in minutes : 60 minutes   Aquan Kope S M.D on 02/26/2016 at 5:21 AM  Between 7am to 7pm - Pager - [phone]. After 7pm go to [email or web address] - password [facility]  [facility] - Office  [phone]

## 2016-02-26 NOTE — ED Notes (Addendum)
Pt's family contact is grandson Silvio Clayman [phone]

## 2016-02-26 NOTE — Progress Notes (Signed)
*  PRELIMINARY RESULTS* Echocardiogram 2D Echocardiogram has been performed.  Donna [name] 02/26/2016, 3:50 PM

## 2016-02-26 NOTE — Care Management CC44 (Signed)
Condition Code 44 Documentation Completed  Patient Details  Name: Donna [name] MRN: [id number] Date of Birth: 05-30-1941   Condition Code 44 given:  Yes Patient signature on Condition Code 44 notice:  Yes Documentation of 2 MD's agreement:  Yes Code 44 added to claim:  Yes    Sherald Barge, RN 02/26/2016, 2:37 PM

## 2016-02-26 NOTE — ED Notes (Signed)
CRITICAL VALUE ALERT  Critical value received:  Trop 0.04  Date of notification:  02/26/2016  Time of notification:  03:38  Critical value read back: yes  Nurse who received alert:  Atilano Median RN   MD notified (1st page):  Dr Tomi Bamberger  Time of first page:  03:38  MD notified (2nd page):  Time of second page:  Responding MD:  Dr Tomi Bamberger  Time MD responded:  03:38

## 2016-02-26 NOTE — ED Notes (Signed)
bipap removed by resp, pt tolerated well, pulse ox remains at 94% on 3L

## 2016-02-26 NOTE — ED Triage Notes (Signed)
Pt states that she woke up with sob, pt in resp distress on ems arrival to er, pulse ox 80% on RA, pt alert, able to speak in short sentences, pale, cool to touch, Dr Tomi Bamberger at bedside,

## 2016-02-26 NOTE — ED Notes (Signed)
Dr Darrick Meigs at bedside V.O to dc bipap, resp paged,

## 2016-02-26 NOTE — Care Management Note (Signed)
Case Management Note  Patient Details  Name: Donna [name] MRN: [id number] Date of Birth: 1941/03/14  Subjective/Objective:                  Pt admitted with CHF. She is from home, lives with her grandson and is ind with ADL's. She drives herself to appointments. She has PCP and insurance with drug coverage. She does not use home oxygen or neb machine PTA. She plans to return home with self care at DC.   Action/Plan: No CM needs anticipated.   Expected Discharge Date:     02/27/2016             Expected Discharge Plan:  Home/Self Care  In-House Referral:  NA  Discharge planning Services  CM Consult  Post Acute Care Choice:  NA Choice offered to:  NA  DME Arranged:    DME Agency:     HH Arranged:    HH Agency:     Status of Service:  Completed, signed off  If discussed at [employer] of Stay Meetings, dates discussed:    Additional Comments:  Sherald Barge, RN 02/26/2016, 2:38 PM

## 2016-02-26 NOTE — ED Notes (Signed)
MD at bedside. 

## 2016-02-26 NOTE — ED Notes (Addendum)
Pt vaginal area red, urine cloudy,

## 2016-02-26 NOTE — Progress Notes (Signed)
Subjective: She was admitted with another exacerbation of COPD. She's had some adjustments made in her medications by her cardiologist and had done well but developed increasing problems with shortness of breath and presented to the emergency room early this morning and was admitted from there. She had a hospitalization not too long ago that appeared to be related to dietary indiscretions but she says she has not changed her diet. She's been dizzy and somewhat hypotensive so her blood pressure medications have been adjusted and she had a decrease in dose of spironolactone  Objective: Vital signs in last 24 hours: Temp:  [97.6 F (36.4 C)-98.2 F (36.8 C)] 97.7 F (36.5 C) (09/22 0652) Pulse Rate:  [58-96] 58 (09/22 0652) Resp:  [17-32] 17 (09/22 0652) BP: (104-142)/(54-82) 117/58 (09/22 0652) SpO2:  [79 %-99 %] 99 % (09/22 0652) FiO2 (%):  [30 %-40 %] 30 % (09/22 0423) Weight:  [54.2 kg (119 lb 6.4 oz)-54.4 kg (120 lb)] 54.2 kg (119 lb 6.4 oz) (09/22 4825) Weight change:  Last BM Date: 02/26/16  Intake/Output from previous day: 09/21 0701 - 09/22 0700 In: -  Out: 800 [Urine:800]  PHYSICAL EXAM General appearance: alert, cooperative and mild distress Resp: rales bilaterally Cardio: regular rate and rhythm, S1, S2 normal, no murmur, click, rub or gallop GI: soft, non-tender; bowel sounds normal; no masses,  no organomegaly Extremities: Trace edema bilaterally  Lab Results:  Results for orders placed or performed during the hospital encounter of 02/26/16 (from the past 48 hour(s))  CBC with Differential     Status: Abnormal   Collection Time: 02/26/16  2:45 AM  Result Value Ref Range   WBC 11.5 (H) 4.0 - 10.5 K/uL   RBC 3.82 (L) 3.87 - 5.11 MIL/uL   Hemoglobin 12.3 12.0 - 15.0 g/dL   HCT 37.7 36.0 - 46.0 %   MCV 98.7 78.0 - 100.0 fL   MCH 32.2 26.0 - 34.0 pg   MCHC 32.6 30.0 - 36.0 g/dL   RDW 13.9 11.5 - 15.5 %   Platelets 215 150 - 400 K/uL   Neutrophils Relative % 59 %    Neutro Abs 6.7 1.7 - 7.7 K/uL   Lymphocytes Relative 35 %   Lymphs Abs 4.0 0.7 - 4.0 K/uL   Monocytes Relative 5 %   Monocytes Absolute 0.6 0.1 - 1.0 K/uL   Eosinophils Relative 1 %   Eosinophils Absolute 0.1 0.0 - 0.7 K/uL   Basophils Relative 0 %   Basophils Absolute 0.0 0.0 - 0.1 K/uL  Brain natriuretic peptide     Status: Abnormal   Collection Time: 02/26/16  2:45 AM  Result Value Ref Range   B Natriuretic Peptide 785.0 (H) 0.0 - 100.0 pg/mL  Comprehensive metabolic panel     Status: Abnormal   Collection Time: 02/26/16  2:45 AM  Result Value Ref Range   Sodium 134 (L) 135 - 145 mmol/L   Potassium 4.2 3.5 - 5.1 mmol/L   Chloride 104 101 - 111 mmol/L   CO2 27 22 - 32 mmol/L   Glucose, Bld 179 (H) 65 - 99 mg/dL   BUN 19 6 - 20 mg/dL   Creatinine, Ser 0.65 0.44 - 1.00 mg/dL   Calcium 8.9 8.9 - 10.3 mg/dL   Total Protein 7.7 6.5 - 8.1 g/dL   Albumin 4.6 3.5 - 5.0 g/dL   AST 24 15 - 41 U/L   ALT 19 14 - 54 U/L   Alkaline Phosphatase 58 38 - 126 U/L  Total Bilirubin 0.5 0.3 - 1.2 mg/dL   GFR calc non Af Amer >60 >60 mL/min   GFR calc Af Amer >60 >60 mL/min    Comment: (NOTE) The eGFR has been calculated using the CKD EPI equation. This calculation has not been validated in all clinical situations. eGFR's persistently <60 mL/min signify possible Chronic Kidney Disease.    Anion gap 3 (L) 5 - 15  Troponin I     Status: Abnormal   Collection Time: 02/26/16  2:45 AM  Result Value Ref Range   Troponin I 0.04 (HH) <0.03 ng/mL    Comment: CRITICAL RESULT CALLED TO, READ BACK BY AND VERIFIED WITH: POINDEXTER M AT 0329 ON [id number] BY FORSYTH K   CBG monitoring, ED     Status: Abnormal   Collection Time: 02/26/16  2:46 AM  Result Value Ref Range   Glucose-Capillary 176 (H) 65 - 99 mg/dL  Urinalysis, Routine w reflex microscopic     Status: Abnormal   Collection Time: 02/26/16  3:14 AM  Result Value Ref Range   Color, Urine YELLOW YELLOW   APPearance CLEAR CLEAR   Specific  Gravity, Urine 1.010 1.005 - 1.030   pH 5.5 5.0 - 8.0   Glucose, UA NEGATIVE NEGATIVE mg/dL   Hgb urine dipstick NEGATIVE NEGATIVE   Bilirubin Urine NEGATIVE NEGATIVE   Ketones, ur NEGATIVE NEGATIVE mg/dL   Protein, ur NEGATIVE NEGATIVE mg/dL   Nitrite NEGATIVE NEGATIVE   Leukocytes, UA SMALL (A) NEGATIVE  Urine microscopic-add on     Status: Abnormal   Collection Time: 02/26/16  3:14 AM  Result Value Ref Range   Squamous Epithelial / LPF 0-5 (A) NONE SEEN   WBC, UA 6-30 0 - 5 WBC/hpf   RBC / HPF 0-5 0 - 5 RBC/hpf   Bacteria, UA MANY (A) NONE SEEN    ABGS No results for input(s): PHART, PO2ART, TCO2, HCO3 in the last 72 hours.  Invalid input(s): PCO2 CULTURES No results found for this or any previous visit (from the past 240 hour(s)). Studies/Results: Dg Chest Port 1 View  Result Date: 02/26/2016 CLINICAL DATA:  Acute onset of shortness of breath and rales. Initial encounter. EXAM: PORTABLE CHEST 1 VIEW COMPARISON:  Chest radiograph performed 12/29/2015 FINDINGS: The lungs are well-aerated. Vascular congestion is noted. Patchy bilateral opacities may reflect mild interstitial edema. No pleural effusion or pneumothorax is seen. The cardiomediastinal silhouette is mildly enlarged. A pacemaker/AICD is noted overlying the left chest wall, with leads ending overlying the right atrium, right ventricle and coronary sinus. No acute osseous abnormalities are seen. IMPRESSION: Vascular congestion noted. Patchy bilateral airspace opacities may reflect mild interstitial edema. Mild cardiomegaly noted. Electronically Signed   By: Garald Balding M.D.   On: 02/26/2016 04:40    Medications:  Prior to Admission:  Prescriptions Prior to Admission  Medication Sig Dispense Refill Last Dose  . acetaminophen (TYLENOL) 500 MG tablet Take 500 mg by mouth every 6 (six) hours as needed.   Taking  . aspirin 81 MG tablet Take 81 mg by mouth daily.   Taking  . atorvastatin (LIPITOR) 20 MG tablet Take 1 tablet  (20 mg total) by mouth daily at 6 PM. 30 tablet 12 Taking  . calcium gluconate 500 MG tablet Take 500 mg by mouth 2 (two) times daily.     Taking  . carvedilol (COREG) 3.125 MG tablet TAKE ONE TABLET TWICE DAILY 60 tablet 11 Taking  . fish oil-omega-3 fatty acids 1000 MG capsule Take 1  capsule by mouth 3 (three) times daily.     Taking  . furosemide (LASIX) 40 MG tablet Take 40 mg by mouth 2 (two) times daily. Reported on 08/26/2015   Taking  . insulin detemir (LEVEMIR) 100 UNIT/ML injection Inject 12 Units into the skin at bedtime.   Taking  . levothyroxine (SYNTHROID, LEVOTHROID) 75 MCG tablet Take 75 mcg by mouth daily before breakfast.   Taking  . linagliptin (TRADJENTA) 5 MG TABS tablet Take 5 mg by mouth daily.   Taking  . losartan (COZAAR) 25 MG tablet Take 0.5 tablets (12.5 mg total) by mouth daily. 90 tablet 3   . omeprazole (PRILOSEC) 20 MG capsule Take 20 mg by mouth 2 (two) times daily.     Taking  . potassium chloride (KLOR-CON) 20 MEQ packet Take 20 mEq by mouth 2 (two) times daily. 60 tablet 1 Taking  . spironolactone (ALDACTONE) 25 MG tablet Take 12.5 mg by mouth daily.     Taking  . UNIFINE PENTIPS 32G X 4 MM MISC USE TWICE A DAY TO TAKE INSULIN. 100 each 2 Taking   Scheduled: . aspirin EC  81 mg Oral Daily  . atorvastatin  20 mg Oral q1800  . carvedilol  3.125 mg Oral BID  . enoxaparin (LOVENOX) injection  40 mg Subcutaneous Q24H  . furosemide  20 mg Intravenous Q12H  . insulin aspart  0-9 Units Subcutaneous TID WC  . insulin detemir  12 Units Subcutaneous QHS  . levothyroxine  75 mcg Oral QAC breakfast  . losartan  12.5 mg Oral Daily  . mouth rinse  15 mL Mouth Rinse BID  . potassium chloride  20 mEq Oral BID  . sodium chloride flush  3 mL Intravenous Q12H   Continuous:  MBT:DHRCBU chloride, acetaminophen, ondansetron (ZOFRAN) IV, sodium chloride flush  Assesment: She is admitted with acute hypoxic respiratory failure. She has acute on chronic systolic heart  failure. Active Problems:   Hypothyroidism   Diabetes mellitus, type II ([place])   CHF exacerbation ([place])   Acute exacerbation of CHF (congestive heart failure) ([place])   Acute respiratory failure with hypoxia (HCC)   NICM (nonischemic cardiomyopathy) ([place])    Plan: Continue current treatments. Request cardiology consultation. She has significant issues with dizziness and has been somewhat hypotensive so her medications had been adjusted.    LOS: 1 day   Leaman Abe L 02/26/2016, 8:50 AM

## 2016-02-26 NOTE — ED Notes (Signed)
Pt resting at present, no distress noted,

## 2016-02-26 NOTE — Consult Note (Addendum)
Primary cardiologist: Dr Smitty Cords Consulting cardiologist: Dr Carlyle Dolly MD Requesting physician: Dr Sinda Du Indication: SOB  Clinical Summary Donna [name] is a 75 y.o.female with history of NICM LVEF 15-20%, BiV AICD, history of VT, HL admitted with SOB. I saw her in clinic 02/23/16 where overall she was doing well other than contuing to have troubles with chronic fatigue and dizziness. We have been downtitrating her CHF meds to see if this would improve her symptoms.  She presented to ER severely SOB and hypoxic, reported O2 sats in 70s. Required bipap. Symptoms improved with NG patch and IV lasix.   She reports feeling well throughout the day yesterday. Around 130AM awoke from sleep with sudden SOB. Denies any significant chest pain or palpitaitons. No recent weight gain or edema, reports compliance with meds.    WBC 11.5 Hgb 12.3 Plt 215 BNP 785 K 4.2 Cr 0.65  Trop 0.04-->0.05 CXR pulm edema EKG A sensed, V-paced    Allergies  Allergen Reactions  . Codeine Other (See Comments)    Reaction:  Racing heart   . Decongestant [Pseudoephedrine Hcl Er] Other (See Comments)    Reaction:  Racing heart     Medications Scheduled Medications: . aspirin EC  81 mg Oral Daily  . atorvastatin  20 mg Oral q1800  . carvedilol  3.125 mg Oral BID  . enoxaparin (LOVENOX) injection  40 mg Subcutaneous Q24H  . furosemide  20 mg Intravenous Q12H  . insulin aspart  0-9 Units Subcutaneous TID WC  . insulin detemir  12 Units Subcutaneous QHS  . levothyroxine  75 mcg Oral QAC breakfast  . losartan  12.5 mg Oral Daily  . mouth rinse  15 mL Mouth Rinse BID  . potassium chloride  20 mEq Oral BID  . sodium chloride flush  3 mL Intravenous Q12H     Infusions:     PRN Medications:  sodium chloride, acetaminophen, ondansetron (ZOFRAN) IV, sodium chloride flush   Past Medical History:  Diagnosis Date  . AICD (automatic cardioverter/defibrillator) present   . Arthritis     "fingers" (11/18/2014)  . Atrial flutter (HCC)    Versus ventricular tachycardia; treated with amiodarone  . Cardiomyopathy, nonischemic ([place])    Nl cors in [place]; CHF in 12/97; EF of 40% in 5/01 and 7/03, 25% in 9/04 and 4/08.  systolic murmur without valvular abnormalities by echo; refused automatic implantable cardiac defibrillator  . Cerebrovascular disease    Duplex study in 12/2006 shows atherosclerosis without focal stenosis  . CHF (congestive heart failure) ([place])   . Diabetes mellitus, type II ([place])    No insulin  . Gastroesophageal reflux disease   . Hyperlipidemia   . Hypertension   . Left bundle Caryl Manas block   . Ventricular tachycardia ([place])    a. PMH it lists "atrial flutter versus ventricular tachycardia treated with amiodarone" - details of this are unclear. Notes from back to 2006 indicate she has been maintained on amiodarone but do not describe further indication. Patient's sister reports pt was told she had skipped beats that could cause her to drop dead - Dr. Lattie Haw put her on amiodarone and recommended a defibrillator.    Past Surgical History:  Procedure Laterality Date  . ABDOMINAL HYSTERECTOMY  2006  . BI-VENTRICULAR IMPLANTABLE CARDIOVERTER DEFIBRILLATOR  (CRT-D)  11/18/2014  . COLONOSCOPY  2008  . EP IMPLANTABLE DEVICE N/A 11/18/2014   Procedure: BiV ICD Insertion CRT-D;  Surgeon: Evans Lance, MD;  Location: [facility];  Service: Cardiovascular;  Laterality: N/A;  . HERNIA REPAIR Right   . TONSILLECTOMY      Family History  Problem Relation Age of Onset  . Diabetes Mother   . Kidney disease Mother   . Diabetes Sister   . Heart attack Brother     Social History Ms. Willner reports that she has never smoked. She has never used smokeless tobacco. Ms. Searson reports that she does not drink alcohol.  Review of Systems CONSTITUTIONAL: No weight loss, fever, chills, weakness or fatigue.  HEENT: Eyes: No visual loss, blurred vision,  double vision or yellow sclerae. No hearing loss, sneezing, congestion, runny nose or sore throat.  SKIN: No rash or itching.  CARDIOVASCULAR: per hpi RESPIRATORY: per hpi  GASTROINTESTINAL: No anorexia, nausea, vomiting or diarrhea. No abdominal pain or blood.  GENITOURINARY: no polyuria, no dysuria NEUROLOGICAL: No headache, dizziness, syncope, paralysis, ataxia, numbness or tingling in the extremities. No change in bowel or bladder control.  MUSCULOSKELETAL: No muscle, back pain, joint pain or stiffness.  HEMATOLOGIC: No anemia, bleeding or bruising.  LYMPHATICS: No enlarged nodes. No history of splenectomy.  PSYCHIATRIC: No history of depression or anxiety.      Physical Examination Blood pressure (!) 117/58, pulse (!) 58, temperature 97.7 F (36.5 C), temperature source Oral, resp. rate 17, weight 119 lb 6.4 oz (54.2 kg), SpO2 99 %.  Intake/Output Summary (Last 24 hours) at 02/26/16 0928 Last data filed at 02/26/16 0511  Gross per 24 hour  Intake                0 ml  Output              800 ml  Net             -800 ml    HEENT: sclera clear, throat clear  Cardiovascular: RRR, no m/r/g, no jvd  Respiratory: crackles bilateral bases  GI: abdomen soft, NT, ND  MSK: no LE edema  Neuro: no focal deficits  Psych: appropriate affect   Lab Results  Basic Metabolic Panel:  Recent Labs Lab 02/24/16 0923 02/26/16 0245 02/26/16 0821  NA 142 134*  --   K 3.9 4.2  --   CL 99 104  --   CO2 27 27  --   GLUCOSE 98 179*  --   BUN 18 19  --   CREATININE 0.74 0.65 0.63  CALCIUM 9.4 8.9  --   MG 2.3  --   --     Liver Function Tests:  Recent Labs Lab 02/24/16 0923 02/26/16 0245  AST 19 24  ALT 13 19  ALKPHOS 50 58  BILITOT 0.5 0.5  PROT 6.7 7.7  ALBUMIN 4.4 4.6    CBC:  Recent Labs Lab 02/24/16 0923 02/26/16 0245 02/26/16 0821  WBC 6.8 11.5* 9.3  NEUTROABS 4.4 6.7  --   HGB  --  12.3 11.6*  HCT 33.3* 37.7 35.7*  MCV 95 98.7 97.8  PLT 202 215 179     Cardiac Enzymes:  Recent Labs Lab 02/26/16 0245 02/26/16 0821  TROPONINI 0.04* 0.05*    BNP: Invalid input(s): POCBNP     Impression/Recommendations  1. Acute on chronic combined systolic/diastolic HF - I/Os incomplete, has put out at least 847mL of urine. Renal function remains stable, she is on lasix IV 20mg  bid.  - weights overall stable, typically around 120. She did have edema on CXR and elevated BNP. - labs show  chronically mildly elevated troponin for over a year. Fairly flat trend this admit, do not suspect ACS at this time, continue to follow trend.  - with her history of ventricular arrhythmias and fairly acute onset of CHF, we will have her device interrogated by [facility] to evaluate for arrhythmia as etiology of her acute decompensation. She reports medication and sodium restriction compliance.  - continue current CHF meds, in general we have been downtitrating due to soft bp's and chronic fatigue and dizziness. Recently lowered losaratn to 12.5mg  and aldactone to 12.5mg  daily. In the absence of severe HTN these med changes should not trigger a CHF exacerbation.  - over 1 year since last echo, in setting of acute decompensation repeat.   2. Hypothyroidism - recent TSH elevated, defer potentially increasing her synthroid to primary team. She has had some chronic fatigue and low energy.    Carlyle Dolly, M.D.

## 2016-02-26 NOTE — Progress Notes (Signed)
Patient taken off BIPAP per Dr. Tomi Bamberger and placed on 3 lpm nasal cannula.

## 2016-02-26 NOTE — ED Notes (Signed)
cbg of 176.

## 2016-02-26 NOTE — Progress Notes (Signed)
CRITICAL VALUE ALERT  Critical value received:  Troponin 0.05  Date of notification:  02/26/16  Time of notification:  0925  Critical value read back:Yes.    Nurse who received alert:  Theola Sequin RN  MD notified (1st page):  Branch MD  Time of first page:  [phone]  MD notified (2nd page):  Time of second page:  Responding MD:  Harl Bowie MD  Time MD responded:  [phone]

## 2016-02-27 LAB — BASIC METABOLIC PANEL
ANION GAP: 9 (ref 5–15)
BUN: 15 mg/dL (ref 6–20)
CHLORIDE: 98 mmol/L — AB (ref 101–111)
CO2: 29 mmol/L (ref 22–32)
Calcium: 8.6 mg/dL — ABNORMAL LOW (ref 8.9–10.3)
Creatinine, Ser: 0.54 mg/dL (ref 0.44–1.00)
GFR calc non Af Amer: >60 mL/min (ref >60)
GLUCOSE: 166 mg/dL — AB (ref 65–99)
POTASSIUM: 3.2 mmol/L — AB (ref 3.5–5.1)
Sodium: 136 mmol/L (ref 135–145)

## 2016-02-27 LAB — URINE CULTURE: CULTURE: NO GROWTH

## 2016-02-27 LAB — GLUCOSE, CAPILLARY: GLUCOSE-CAPILLARY: 110 mg/dL — AB (ref 65–99)

## 2016-02-27 NOTE — Progress Notes (Signed)
Subjective: She says she feels well and wants to go home.  Objective: Vital signs in last 24 hours: Temp:  [97.8 F (36.6 C)-98 F (36.7 C)] 98 F (36.7 C) (09/23 0554) Pulse Rate:  [63-64] 63 (09/23 0554) Resp:  [20] 20 (09/23 0554) BP: (117-119)/(59) 117/59 (09/23 0554) SpO2:  [98 %-100 %] 98 % (09/23 0554) Weight:  [54.1 kg (119 lb 4.8 oz)] 54.1 kg (119 lb 4.8 oz) (09/23 0554) Weight change: -0.318 kg (-11.2 oz) Last BM Date: 02/26/16  Intake/Output from previous day: 09/22 0701 - 09/23 0700 In: 718 [P.O.:718] Out: 751 [Urine:750; Stool:1]  PHYSICAL EXAM General appearance: alert, cooperative and no distress Resp: clear to auscultation bilaterally Cardio: regular rate and rhythm, S1, S2 normal, no murmur, click, rub or gallop GI: soft, non-tender; bowel sounds normal; no masses,  no organomegaly Extremities: extremities normal, atraumatic, no cyanosis or edema  Lab Results:  Results for orders placed or performed during the hospital encounter of 02/26/16 (from the past 48 hour(s))  CBC with Differential     Status: Abnormal   Collection Time: 02/26/16  2:45 AM  Result Value Ref Range   WBC 11.5 (H) 4.0 - 10.5 K/uL   RBC 3.82 (L) 3.87 - 5.11 MIL/uL   Hemoglobin 12.3 12.0 - 15.0 g/dL   HCT 37.7 36.0 - 46.0 %   MCV 98.7 78.0 - 100.0 fL   MCH 32.2 26.0 - 34.0 pg   MCHC 32.6 30.0 - 36.0 g/dL   RDW 13.9 11.5 - 15.5 %   Platelets 215 150 - 400 K/uL   Neutrophils Relative % 59 %   Neutro Abs 6.7 1.7 - 7.7 K/uL   Lymphocytes Relative 35 %   Lymphs Abs 4.0 0.7 - 4.0 K/uL   Monocytes Relative 5 %   Monocytes Absolute 0.6 0.1 - 1.0 K/uL   Eosinophils Relative 1 %   Eosinophils Absolute 0.1 0.0 - 0.7 K/uL   Basophils Relative 0 %   Basophils Absolute 0.0 0.0 - 0.1 K/uL  Brain natriuretic peptide     Status: Abnormal   Collection Time: 02/26/16  2:45 AM  Result Value Ref Range   B Natriuretic Peptide 785.0 (H) 0.0 - 100.0 pg/mL  Comprehensive metabolic panel     Status:  Abnormal   Collection Time: 02/26/16  2:45 AM  Result Value Ref Range   Sodium 134 (L) 135 - 145 mmol/L   Potassium 4.2 3.5 - 5.1 mmol/L   Chloride 104 101 - 111 mmol/L   CO2 27 22 - 32 mmol/L   Glucose, Bld 179 (H) 65 - 99 mg/dL   BUN 19 6 - 20 mg/dL   Creatinine, Ser 0.65 0.44 - 1.00 mg/dL   Calcium 8.9 8.9 - 10.3 mg/dL   Total Protein 7.7 6.5 - 8.1 g/dL   Albumin 4.6 3.5 - 5.0 g/dL   AST 24 15 - 41 U/L   ALT 19 14 - 54 U/L   Alkaline Phosphatase 58 38 - 126 U/L   Total Bilirubin 0.5 0.3 - 1.2 mg/dL   GFR calc non Af Amer >60 >60 mL/min   GFR calc Af Amer >60 >60 mL/min    Comment: (NOTE) The eGFR has been calculated using the CKD EPI equation. This calculation has not been validated in all clinical situations. eGFR's persistently <60 mL/min signify possible Chronic Kidney Disease.    Anion gap 3 (L) 5 - 15  Troponin I     Status: Abnormal   Collection Time: 02/26/16  2:45 AM  Result Value Ref Range   Troponin I 0.04 (HH) <0.03 ng/mL    Comment: CRITICAL RESULT CALLED TO, READ BACK BY AND VERIFIED WITH: POINDEXTER M AT 0329 ON 010071 BY FORSYTH K   CBG monitoring, ED     Status: Abnormal   Collection Time: 02/26/16  2:46 AM  Result Value Ref Range   Glucose-Capillary 176 (H) 65 - 99 mg/dL  Urinalysis, Routine w reflex microscopic     Status: Abnormal   Collection Time: 02/26/16  3:14 AM  Result Value Ref Range   Color, Urine YELLOW YELLOW   APPearance CLEAR CLEAR   Specific Gravity, Urine 1.010 1.005 - 1.030   pH 5.5 5.0 - 8.0   Glucose, UA NEGATIVE NEGATIVE mg/dL   Hgb urine dipstick NEGATIVE NEGATIVE   Bilirubin Urine NEGATIVE NEGATIVE   Ketones, ur NEGATIVE NEGATIVE mg/dL   Protein, ur NEGATIVE NEGATIVE mg/dL   Nitrite NEGATIVE NEGATIVE   Leukocytes, UA SMALL (A) NEGATIVE  Urine microscopic-add on     Status: Abnormal   Collection Time: 02/26/16  3:14 AM  Result Value Ref Range   Squamous Epithelial / LPF 0-5 (A) NONE SEEN   WBC, UA 6-30 0 - 5 WBC/hpf    RBC / HPF 0-5 0 - 5 RBC/hpf   Bacteria, UA MANY (A) NONE SEEN  Troponin I (q 6hr x 3)     Status: Abnormal   Collection Time: 02/26/16  8:21 AM  Result Value Ref Range   Troponin I 0.05 (HH) <0.03 ng/mL    Comment: CRITICAL RESULT CALLED TO, READ BACK BY AND VERIFIED WITH: EVERRETT,R AT 9:20AM ON 02/26/16 BY FESTERMAN,C   CBC     Status: Abnormal   Collection Time: 02/26/16  8:21 AM  Result Value Ref Range   WBC 9.3 4.0 - 10.5 K/uL   RBC 3.65 (L) 3.87 - 5.11 MIL/uL   Hemoglobin 11.6 (L) 12.0 - 15.0 g/dL   HCT 35.7 (L) 36.0 - 46.0 %   MCV 97.8 78.0 - 100.0 fL   MCH 31.8 26.0 - 34.0 pg   MCHC 32.5 30.0 - 36.0 g/dL   RDW 13.7 11.5 - 15.5 %   Platelets 179 150 - 400 K/uL  Creatinine, serum     Status: None   Collection Time: 02/26/16  8:21 AM  Result Value Ref Range   Creatinine, Ser 0.63 0.44 - 1.00 mg/dL   GFR calc non Af Amer >60 >60 mL/min   GFR calc Af Amer >60 >60 mL/min    Comment: (NOTE) The eGFR has been calculated using the CKD EPI equation. This calculation has not been validated in all clinical situations. eGFR's persistently <60 mL/min signify possible Chronic Kidney Disease.   Glucose, capillary     Status: Abnormal   Collection Time: 02/26/16  9:04 AM  Result Value Ref Range   Glucose-Capillary 139 (H) 65 - 99 mg/dL   Comment 1 Document in Chart   Glucose, capillary     Status: Abnormal   Collection Time: 02/26/16 11:19 AM  Result Value Ref Range   Glucose-Capillary 186 (H) 65 - 99 mg/dL   Comment 1 Document in Chart   Troponin I (q 6hr x 3)     Status: Abnormal   Collection Time: 02/26/16  1:26 PM  Result Value Ref Range   Troponin I 0.04 (HH) <0.03 ng/mL    Comment: CRITICAL VALUE NOTED.  VALUE IS CONSISTENT WITH PREVIOUSLY REPORTED AND CALLED VALUE.  Glucose, capillary  Status: None   Collection Time: 02/26/16  3:25 PM  Result Value Ref Range   Glucose-Capillary 97 65 - 99 mg/dL   Comment 1 Document in Chart   Troponin I (q 6hr x 3)     Status:  Abnormal   Collection Time: 02/26/16  8:12 PM  Result Value Ref Range   Troponin I 0.06 (HH) <0.03 ng/mL    Comment: CRITICAL VALUE NOTED.  VALUE IS CONSISTENT WITH PREVIOUSLY REPORTED AND CALLED VALUE.  Glucose, capillary     Status: Abnormal   Collection Time: 02/26/16  9:47 PM  Result Value Ref Range   Glucose-Capillary 150 (H) 65 - 99 mg/dL   Comment 1 Notify RN    Comment 2 Document in Chart   Glucose, capillary     Status: Abnormal   Collection Time: 02/27/16  7:31 AM  Result Value Ref Range   Glucose-Capillary 110 (H) 65 - 99 mg/dL   Comment 1 Notify RN   Basic metabolic panel     Status: Abnormal   Collection Time: 02/27/16  8:48 AM  Result Value Ref Range   Sodium 136 135 - 145 mmol/L   Potassium 3.2 (L) 3.5 - 5.1 mmol/L    Comment: DELTA CHECK NOTED   Chloride 98 (L) 101 - 111 mmol/L   CO2 29 22 - 32 mmol/L   Glucose, Bld 166 (H) 65 - 99 mg/dL   BUN 15 6 - 20 mg/dL   Creatinine, Ser 0.54 0.44 - 1.00 mg/dL   Calcium 8.6 (L) 8.9 - 10.3 mg/dL   GFR calc non Af Amer >60 >60 mL/min   GFR calc Af Amer >60 >60 mL/min    Comment: (NOTE) The eGFR has been calculated using the CKD EPI equation. This calculation has not been validated in all clinical situations. eGFR's persistently <60 mL/min signify possible Chronic Kidney Disease.    Anion gap 9 5 - 15    ABGS No results for input(s): PHART, PO2ART, TCO2, HCO3 in the last 72 hours.  Invalid input(s): PCO2 CULTURES No results found for this or any previous visit (from the past 240 hour(s)). Studies/Results: Dg Chest Port 1 View  Result Date: 02/26/2016 CLINICAL DATA:  Acute onset of shortness of breath and rales. Initial encounter. EXAM: PORTABLE CHEST 1 VIEW COMPARISON:  Chest radiograph performed 12/29/2015 FINDINGS: The lungs are well-aerated. Vascular congestion is noted. Patchy bilateral opacities may reflect mild interstitial edema. No pleural effusion or pneumothorax is seen. The cardiomediastinal silhouette is  mildly enlarged. A pacemaker/AICD is noted overlying the left chest wall, with leads ending overlying the right atrium, right ventricle and coronary sinus. No acute osseous abnormalities are seen. IMPRESSION: Vascular congestion noted. Patchy bilateral airspace opacities may reflect mild interstitial edema. Mild cardiomegaly noted. Electronically Signed   By: Garald Balding M.D.   On: 02/26/2016 04:40    Medications:  Prior to Admission:  Prescriptions Prior to Admission  Medication Sig Dispense Refill Last Dose  . acetaminophen (TYLENOL) 500 MG tablet Take 500 mg by mouth every 6 (six) hours as needed.   unknown  . aspirin 81 MG tablet Take 81 mg by mouth daily.   02/25/2016 at Unknown time  . atorvastatin (LIPITOR) 20 MG tablet Take 1 tablet (20 mg total) by mouth daily at 6 PM. 30 tablet 12 02/25/2016 at Unknown time  . calcium gluconate 500 MG tablet Take 500-1,000 mg by mouth 2 (two) times daily. 2 tablets in the morning and 1 tablet at night.  02/25/2016 at Unknown time  . carvedilol (COREG) 3.125 MG tablet TAKE ONE TABLET TWICE DAILY 60 tablet 11 02/25/2016 at 1800  . Cholecalciferol (VITAMIN D PO) Take 600 mg by mouth 2 (two) times daily.   02/25/2016 at Unknown time  . fish oil-omega-3 fatty acids 1000 MG capsule Take 1 capsule by mouth 3 (three) times daily.     02/25/2016 at Unknown time  . furosemide (LASIX) 40 MG tablet Take 40 mg by mouth 2 (two) times daily. Reported on 08/26/2015   02/25/2016 at Unknown time  . insulin detemir (LEVEMIR) 100 UNIT/ML injection Inject 12 Units into the skin at bedtime.   02/25/2016 at Unknown time  . levothyroxine (SYNTHROID, LEVOTHROID) 75 MCG tablet Take 75 mcg by mouth daily before breakfast.   02/25/2016 at Unknown time  . linagliptin (TRADJENTA) 5 MG TABS tablet Take 5 mg by mouth daily.   02/25/2016 at Unknown time  . losartan (COZAAR) 25 MG tablet Take 0.5 tablets (12.5 mg total) by mouth daily. (Patient taking differently: Take 25 mg by mouth daily. ) 90  tablet 3 02/25/2016 at Unknown time  . omeprazole (PRILOSEC) 20 MG capsule Take 20 mg by mouth 2 (two) times daily.     02/25/2016 at Unknown time  . potassium chloride (KLOR-CON) 20 MEQ packet Take 20 mEq by mouth 2 (two) times daily. 60 tablet 1 02/25/2016 at Unknown time  . spironolactone (ALDACTONE) 25 MG tablet Take 12.5 mg by mouth daily.     02/25/2016 at Unknown time  . UNIFINE PENTIPS 32G X 4 MM MISC USE TWICE A DAY TO TAKE INSULIN. 100 each 2 Taking   Scheduled: . aspirin EC  81 mg Oral Daily  . atorvastatin  20 mg Oral q1800  . carvedilol  3.125 mg Oral BID  . enoxaparin (LOVENOX) injection  40 mg Subcutaneous Q24H  . furosemide  20 mg Intravenous Q12H  . insulin aspart  0-9 Units Subcutaneous TID WC  . insulin detemir  12 Units Subcutaneous QHS  . levothyroxine  75 mcg Oral QAC breakfast  . losartan  12.5 mg Oral Daily  . mouth rinse  15 mL Mouth Rinse BID  . potassium chloride  20 mEq Oral BID  . sodium chloride flush  3 mL Intravenous Q12H  . spironolactone  12.5 mg Oral Daily   Continuous:  DJM:EQASTM chloride, acetaminophen, ondansetron (ZOFRAN) IV, sodium chloride flush  Assesment: She was admitted with acute exacerbation of CHF and she is better. She's ready for discharge. Active Problems:   Hypothyroidism   Diabetes mellitus, type II ([place])   CHF exacerbation ([place])   Acute exacerbation of CHF (congestive heart failure) ([place])   Acute respiratory failure with hypoxia (H[facility])   NICM (nonischemic cardiomyopathy) ([place])    Plan: Discharge home today    LOS: 1 day   Cebastian Neis L 02/27/2016, 10:22 AM

## 2016-02-27 NOTE — Progress Notes (Signed)
Patient being d/c home with instructions. IV cath removed and intact. No c/o pain at site or at this time. Family at bedside

## 2016-02-27 NOTE — Discharge Summary (Signed)
Physician Discharge Summary  Patient ID: Donna [name] MRN: [id number] DOB/AGE: Feb 15, 1941 75 y.o. Primary Care Physician:Murry Khiev L, MD Admit date: 02/26/2016 Discharge date: 02/27/2016    Discharge Diagnoses:   Active Problems:   Hypothyroidism   Diabetes mellitus, type II ([place])   CHF exacerbation (HCC)   Acute exacerbation of CHF (congestive heart failure) ([place])   Acute respiratory failure with hypoxia (HCC)   NICM (nonischemic cardiomyopathy) (HCC)   Acute on chronic systolic CHF (congestive heart failure) ([place])     Medication List    TAKE these medications   acetaminophen 500 MG tablet Commonly known as:  TYLENOL Take 500 mg by mouth every 6 (six) hours as needed.   aspirin 81 MG tablet Take 81 mg by mouth daily.   atorvastatin 20 MG tablet Commonly known as:  LIPITOR Take 1 tablet (20 mg total) by mouth daily at 6 PM.   calcium gluconate 500 MG tablet Take 500-1,000 mg by mouth 2 (two) times daily. 2 tablets in the morning and 1 tablet at night.   carvedilol 3.125 MG tablet Commonly known as:  COREG TAKE ONE TABLET TWICE DAILY   fish oil-omega-3 fatty acids 1000 MG capsule Take 1 capsule by mouth 3 (three) times daily.   furosemide 40 MG tablet Commonly known as:  LASIX Take 40 mg by mouth 2 (two) times daily. Reported on 08/26/2015   insulin detemir 100 UNIT/ML injection Commonly known as:  LEVEMIR Inject 12 Units into the skin at bedtime.   levothyroxine 75 MCG tablet Commonly known as:  SYNTHROID, LEVOTHROID Take 75 mcg by mouth daily before breakfast.   linagliptin 5 MG Tabs tablet Commonly known as:  TRADJENTA Take 5 mg by mouth daily.   losartan 25 MG tablet Commonly known as:  COZAAR Take 0.5 tablets (12.5 mg total) by mouth daily. What changed:  how much to take   omeprazole 20 MG capsule Commonly known as:  PRILOSEC Take 20 mg by mouth 2 (two) times daily.   potassium chloride 20 MEQ packet Commonly known as:  KLOR-CON Take  20 mEq by mouth 2 (two) times daily.   spironolactone 25 MG tablet Commonly known as:  ALDACTONE Take 12.5 mg by mouth daily.   UNIFINE PENTIPS 32G X 4 MM Misc Generic drug:  Insulin Pen Needle USE TWICE A DAY TO TAKE INSULIN.   VITAMIN D PO Take 600 mg by mouth 2 (two) times daily.       Discharged Condition:Improved    Consults: Cardiology, Dr. Harl Bowie  Significant Diagnostic Studies: Dg Chest Port 1 View  Result Date: 02/26/2016 CLINICAL DATA:  Acute onset of shortness of breath and rales. Initial encounter. EXAM: PORTABLE CHEST 1 VIEW COMPARISON:  Chest radiograph performed 12/29/2015 FINDINGS: The lungs are well-aerated. Vascular congestion is noted. Patchy bilateral opacities may reflect mild interstitial edema. No pleural effusion or pneumothorax is seen. The cardiomediastinal silhouette is mildly enlarged. A pacemaker/AICD is noted overlying the left chest wall, with leads ending overlying the right atrium, right ventricle and coronary sinus. No acute osseous abnormalities are seen. IMPRESSION: Vascular congestion noted. Patchy bilateral airspace opacities may reflect mild interstitial edema. Mild cardiomegaly noted. Electronically Signed   By: Garald Balding M.D.   On: 02/26/2016 04:40    Lab Results: Basic Metabolic Panel:  Recent Labs  02/26/16 0245 02/26/16 0821 02/27/16 0848  NA 134*  --  136  K 4.2  --  3.2*  CL 104  --  98*  CO2 27  --  29  GLUCOSE 179*  --  166*  BUN 19  --  15  CREATININE 0.65 0.63 0.54  CALCIUM 8.9  --  8.6*   Liver Function Tests:  Recent Labs  02/26/16 0245  AST 24  ALT 19  ALKPHOS 58  BILITOT 0.5  PROT 7.7  ALBUMIN 4.6     CBC:  Recent Labs  02/26/16 0245 02/26/16 0821  WBC 11.5* 9.3  NEUTROABS 6.7  --   HGB 12.3 11.6*  HCT 37.7 35.7*  MCV 98.7 97.8  PLT 215 179    No results found for this or any previous visit (from the past 240 hour(s)).   Hospital Course: This is a 75 year old who woke up short of  breath. She has known congestive heart failure with ejection fraction in the 20s. She had a fairly recent admission for similar problem and it wasn't related at that time to dietary indiscretion. She says she's been doing much better with her diet this time. She had repeat echocardiogram which didn't show much change. She has a pacemaker defibrillator which was interrogated and did not show any significant cardiac arrhythmias. She improved after diuresis. She was back at baseline at the time of discharge  Discharge Exam: Blood pressure (!) 117/59, pulse 63, temperature 98 F (36.7 C), temperature source Oral, resp. rate 20, height 5\' 1"  (1.549 m), weight 54.1 kg (119 lb 4.8 oz), SpO2 98 %. She is awake and alert. Her chest is clear. Her heart is regular.  Disposition: Home she will continue with daily weights low-salt diet etc. I will discuss with cardiology about changing her diuretic dose if she needs  Discharge Instructions    Discharge patient    Complete by:  As directed         Signed: Carrolyn Hilmes L   02/27/2016, 10:25 AM

## 2016-02-29 ENCOUNTER — Other Ambulatory Visit: Payer: Self-pay | Admitting: Pharmacist

## 2016-02-29 NOTE — Patient Outreach (Signed)
[facility]) Care Management  02/29/2016  Donna [name] May 18, 1941 [id number]  1324:  Patient was referred to [place] by [place] Coordinator, Delrae Rend, for medication assistance evaluation around linagliptin.   Unsuccessful phone outreach to patient, a female answered phone and stated member was not available.  Left a HIPAA compliant message with [facility] Pharmacist name and phone number requesting a return call.   1332:  Inbound call from patient who verified her HIPAA details.  Explained purpose of call to patient.  Patient states she has questions about the coverage gap and cost of linagliptin while in the coverage gap.    Discussed with patient that Medicare sets the coverage gap each year for Medicare Part D and the costs associated with the coverage gap.   For 2017, the coverage gap is entered when total drug spend reaches $3,700 and Part D beneficiaries pay 40% of the plan's cost of brand name and 51% of the plan's cost of generic medications.    Patient states that her local pharmacist told her she may reach the gap soon and linagliptin would become more expensive for her. Patient did not wish to review her medications over the phone with [facility] Pharmacist.    Discussed with patient [place] patient assistance program, Medicare Part D beneficiaries appear to be evaluated on a case-by-case basis and if she applied and were approved, eligibility may last through the current calendar year.   Patient reports she has samples of linagliptin currently and plans to discuss with her PCP---she did not wish to apply to [facility] [place] at this time.  She denied other concerns and states her questions about the coverage gap were answered now.   Plan:  Will close pharmacy case as patient declines applying to manufacturer patient assistance program at this time.   Will route this note to patient's PCP as an update.    Will route this note to [facility] RN [facility] [place], as an update.   Patient is aware she can call [facility] if she wishes to review her medications or apply to manufacturer patient assistance program at a later date.   Donna [name], PharmD, [place] [phone]

## 2016-03-01 ENCOUNTER — Other Ambulatory Visit: Payer: Self-pay | Admitting: *Deleted

## 2016-03-01 NOTE — Patient Outreach (Signed)
[facility]) Care Management  03/01/2016  Donna [name] Jan 14, 1941 [id number]   Donna [name] is a 24 year olf delightful lady with recent hospital admission for acute on chronic systolic heart failure and history of ventricular tachycardia s/p AICD/BiV pacemaker device placement, diabetes mellitus, type II, and acute respiratory failure with hypoxia.  Donna [name] lives in [place] in her home with an adult grandson. Her sister also lives in the area and has a close relationship with Donna [name].   I followed Donna [name] in the community, including a face to face visit, after her heart failure admission. I spoke with Donna [name] by phone today and she reports that she had been to the emergency department over the weekend and had to stay overnight. Indeed, she was admitted with shortness of breath and found to have some edema on chest xray. Today, she is concerned about whether or not she has to go to the cardiology office for a defib check because she sees it on her discharge summary. I explained that this is a remote check.   Plan: I will follow up with Donna [name] by phone again next week. She is to call for any new or worsening symptom and will continue to check her weight daily and call for any gain of 3# overnight or 5# in a week.    [place] Management  [phone]

## 2016-03-03 ENCOUNTER — Telehealth: Payer: Self-pay | Admitting: Cardiology

## 2016-03-03 DIAGNOSIS — Z79899 Other long term (current) drug therapy: Secondary | ICD-10-CM

## 2016-03-03 MED ORDER — TORSEMIDE 20 MG PO TABS: 20.0000 mg | ORAL_TABLET | Freq: Two times a day (BID) | ORAL | 3 refills | Status: DC

## 2016-03-03 NOTE — Telephone Encounter (Signed)
D/c instructions have her take lasix 40 mg BID, yet she is still c/o swelling

## 2016-03-03 NOTE — Telephone Encounter (Signed)
Pt was discharged from [facility] and states she's having lots of swelling and would like to know if there's something she can take

## 2016-03-03 NOTE — Telephone Encounter (Signed)
Apt wed with Gerrianne Scale at 2 pm Oct 4th,pt aware, will give lab slips then new rx escribed

## 2016-03-03 NOTE — Telephone Encounter (Signed)
Have her stop lasix, start torsemide 20mg  bid. She needs to see PA early next week. Needs BMET and Mg early next week   J Tacie Mccuistion MD

## 2016-03-07 ENCOUNTER — Other Ambulatory Visit: Payer: Self-pay | Admitting: *Deleted

## 2016-03-07 NOTE — Patient Outreach (Signed)
[facility]) Care Management  03/07/2016  FLOELLA BROMAN 04/25/1941 [id number]  I was unable to reach Mrs. Christopoulos by phone today and left a HIPPA compliant message requesting a return call. It is noted that she is to see the NP in the Cardiology office tomorrow to follow up on continued edema.   Plan: I will follow up with Mrs. Yoss by phone.    [place] Management  [phone]

## 2016-03-09 ENCOUNTER — Encounter: Payer: Self-pay | Admitting: Physician Assistant

## 2016-03-09 ENCOUNTER — Ambulatory Visit (INDEPENDENT_AMBULATORY_CARE_PROVIDER_SITE_OTHER): Payer: PPO | Admitting: Physician Assistant

## 2016-03-09 ENCOUNTER — Other Ambulatory Visit: Payer: Self-pay | Admitting: *Deleted

## 2016-03-09 ENCOUNTER — Other Ambulatory Visit (HOSPITAL_COMMUNITY)
Admission: RE | Admit: 2016-03-09 | Discharge: 2016-03-09 | Disposition: A | Discharge status: Home / Self Care | Payer: PPO | Source: Ambulatory Visit | Attending: Cardiology | Admitting: Cardiology

## 2016-03-09 ENCOUNTER — Ambulatory Visit: Payer: Self-pay | Admitting: Cardiology

## 2016-03-09 VITALS — BP 112/66 | HR 69 | Ht 61.0 in | Wt 119.0 lb

## 2016-03-09 DIAGNOSIS — I10 Essential (primary) hypertension: Secondary | ICD-10-CM

## 2016-03-09 DIAGNOSIS — Z79899 Other long term (current) drug therapy: Secondary | ICD-10-CM | POA: Diagnosis present

## 2016-03-09 DIAGNOSIS — Z95810 Presence of automatic (implantable) cardiac defibrillator: Secondary | ICD-10-CM | POA: Diagnosis not present

## 2016-03-09 DIAGNOSIS — I5022 Chronic systolic (congestive) heart failure: Secondary | ICD-10-CM

## 2016-03-09 DIAGNOSIS — I428 Other cardiomyopathies: Secondary | ICD-10-CM | POA: Diagnosis not present

## 2016-03-09 DIAGNOSIS — R110 Nausea: Secondary | ICD-10-CM | POA: Insufficient documentation

## 2016-03-09 LAB — BASIC METABOLIC PANEL
ANION GAP: 7 (ref 5–15)
BUN: 17 mg/dL (ref 6–20)
CHLORIDE: 99 mmol/L — AB (ref 101–111)
CO2: 29 mmol/L (ref 22–32)
CREATININE: 0.76 mg/dL (ref 0.44–1.00)
Calcium: 9.1 mg/dL (ref 8.9–10.3)
GFR calc non Af Amer: >60 mL/min (ref >60)
Glucose, Bld: 121 mg/dL — ABNORMAL HIGH (ref 65–99)
POTASSIUM: 3.7 mmol/L (ref 3.5–5.1)
SODIUM: 135 mmol/L (ref 135–145)

## 2016-03-09 LAB — MAGNESIUM: MAGNESIUM: 2.3 mg/dL (ref 1.7–2.4)

## 2016-03-09 NOTE — Patient Instructions (Signed)
Your physician recommends that you schedule a follow-up appointment with Dr. Harl Bowie  Your physician recommends that you continue on your current medications as directed. Please refer to the Current Medication list given to you today.  Take your medication with food.   If you need a refill on your cardiac medications before your next appointment, please call your pharmacy.  Thank you for choosing [facility]!

## 2016-03-09 NOTE — Progress Notes (Signed)
Cardiology Office Note    Date:  03/09/2016   ID:  [name], [name] [birth year], MRN [id number]  PCP:  Alonza Bogus, MD  Cardiologist: Dr. Harl Bowie   Chief Complaint  Patient presents with  . Follow-up    History of Present Illness:  Donna [name] is a 75 y.o. female  with history of NICM LVEF 15-20%, BiV AICD, history of VT, HL admitted with SOB 02/26/16. Dr. Harl Bowie saw her in clinic 02/23/16 where overall she was doing well other than contuing to have troubles with chronic fatigue and dizziness. We have been downtitrating her CHF meds to see if this would improve her symptoms.  She presented to ER severely SOB and hypoxic, reported O2 sats in 70s. Required bipap. Symptoms improved with NG patch and IV lasix. Her device was interrogated and working fine. Repeat 2-D echo was ordered EF 20-25% with diffuse hypokinesis and grade 2 DD. Patient diuresed and Lasix was changed to Demadex.  Patient comes in today for follow-up accompanied by her daughter. Her main complaint is upset stomach and nausea. She denies any chest pain, dyspnea, dyspnea on exertion, edema, dizziness or presyncope. She just doesn't feel like eating and feels like her abdomen is a little swollen. She is taking potassium on an empty stomach and was taking 3 tablets instead of 2.         Past Medical History:  Diagnosis Date  . AICD (automatic cardioverter/defibrillator) present   . Arthritis    "fingers" (11/18/2014)  . Atrial flutter (HCC)    Versus ventricular tachycardia; treated with amiodarone  . Cardiomyopathy, nonischemic ([place])    Nl cors in [place]; CHF in 12/97; EF of 40% in 5/01 and 7/03, 25% in 9/04 and 4/08.  systolic murmur without valvular abnormalities by echo; refused automatic implantable cardiac defibrillator  . Cerebrovascular disease    Duplex study in 12/2006 shows atherosclerosis without focal stenosis  . CHF (congestive heart failure) ([place])   . Diabetes mellitus, type II ([place])      No insulin  . Gastroesophageal reflux disease   . Hyperlipidemia   . Hypertension   . Left bundle branch block   . Ventricular tachycardia ([place])    a. PMH it lists "atrial flutter versus ventricular tachycardia treated with amiodarone" - details of this are unclear. Notes from back to 2006 indicate she has been maintained on amiodarone but do not describe further indication. Patient's sister reports pt was told she had skipped beats that could cause her to drop dead - Dr. Lattie Haw put her on amiodarone and recommended a defibrillator.    Past Surgical History:  Procedure Laterality Date  . ABDOMINAL HYSTERECTOMY  2006  . BI-VENTRICULAR IMPLANTABLE CARDIOVERTER DEFIBRILLATOR  (CRT-D)  11/18/2014  . COLONOSCOPY  2008  . EP IMPLANTABLE DEVICE N/A 11/18/2014   Procedure: BiV ICD Insertion CRT-D;  Surgeon: Evans Lance, MD;  Location: [facility];  Service: Cardiovascular;  Laterality: N/A;  . HERNIA REPAIR Right   . TONSILLECTOMY      Current Medications: Outpatient Medications Prior to Visit  Medication Sig Dispense Refill  . acetaminophen (TYLENOL) 500 MG tablet Take 500 mg by mouth every 6 (six) hours as needed.    Marland Kitchen aspirin 81 MG tablet Take 81 mg by mouth daily.    Marland Kitchen atorvastatin (LIPITOR) 20 MG tablet Take 1 tablet (20 mg total) by mouth daily at 6 PM. 30 tablet 12  . calcium gluconate 500 MG tablet Take 500-1,000  mg by mouth 2 (two) times daily. 2 tablets in the morning and 1 tablet at night.    . carvedilol (COREG) 3.125 MG tablet TAKE ONE TABLET TWICE DAILY 60 tablet 11  . Cholecalciferol (VITAMIN D PO) Take 600 mg by mouth 2 (two) times daily.    . fish oil-omega-3 fatty acids 1000 MG capsule Take 1 capsule by mouth 3 (three) times daily.      . insulin detemir (LEVEMIR) 100 UNIT/ML injection Inject 12 Units into the skin at bedtime.    Marland Kitchen levothyroxine (SYNTHROID, LEVOTHROID) 75 MCG tablet Take 75 mcg by mouth daily before breakfast.    . linagliptin (TRADJENTA) 5 MG  TABS tablet Take 5 mg by mouth daily.    Marland Kitchen losartan (COZAAR) 25 MG tablet Take 0.5 tablets (12.5 mg total) by mouth daily. (Patient taking differently: Take 25 mg by mouth daily. ) 90 tablet 3  . omeprazole (PRILOSEC) 20 MG capsule Take 20 mg by mouth 2 (two) times daily.      . potassium chloride (KLOR-CON) 20 MEQ packet Take 20 mEq by mouth 2 (two) times daily. 60 tablet 1  . spironolactone (ALDACTONE) 25 MG tablet Take 12.5 mg by mouth daily.      Marland Kitchen torsemide (DEMADEX) 20 MG tablet Take 1 tablet (20 mg total) by mouth 2 (two) times daily. 180 tablet 3  . UNIFINE PENTIPS 32G X 4 MM MISC USE TWICE A DAY TO TAKE INSULIN. 100 each 2   No facility-administered medications prior to visit.      Allergies:   Codeine and Decongestant [pseudoephedrine hcl er]   Social History   Social History  . Marital status: Widowed    Spouse name: N/A  . Number of children: N/A  . Years of education: N/A   Occupational History  . Retired Retired   Social History Main Topics  . Smoking status: Never Smoker  . Smokeless tobacco: Never Used  . Alcohol use No  . Drug use: No  . Sexual activity: No   Other Topics Concern  . None   Social History Narrative   Lives in [place] with son and grandson   Physically active     Family History:  The patient's   family history includes Diabetes in her mother and sister; Heart attack in her brother; Kidney disease in her mother.   ROS:   Please see the history of present illness.    Review of Systems  Constitution: Positive for weakness and malaise/fatigue.  HENT: Negative.   Eyes: Negative.   Cardiovascular: Negative.   Respiratory: Negative.   Hematologic/Lymphatic: Negative.   Musculoskeletal: Negative.  Negative for joint pain.  Gastrointestinal: Positive for bloating and nausea.  Genitourinary: Negative.    All other systems reviewed and are negative.   PHYSICAL EXAM:   VS:  BP 112/66   Pulse 69   Ht 5\' 1"  (1.549 m)   Wt 119 lb (54 kg)    SpO2 98%   BMI 22.48 kg/m   Physical Exam  GEN: Well nourished, well developed, in no acute distress  Neck: no JVD, carotid bruits, or masses Cardiac:RRR; 2 to 3/6 systolic murmur at the apex, no rubs, or gallops  Respiratory:  Decreased breath sounds at left lung base otherwise clear to auscultation bilaterally, normal work of breathing GI: Slightly distended but soft, nontender, + BS Ext: without cyanosis, clubbing, or edema, Good distal pulses bilaterally MS: no deformity or atrophy  Skin: warm and dry, no rash Neuro:  Alert and Oriented x 3, Strength and sensation are intact Psych: euthymic mood, full affect  Wt Readings from Last 3 Encounters:  03/09/16 119 lb (54 kg)  02/27/16 119 lb 4.8 oz (54.1 kg)  02/23/16 120 lb (54.4 kg)      Studies/Labs Reviewed:   EKG:  EKG is not ordered today.    Recent Labs: 02/24/2016: Magnesium 2.3; TSH 4.800 02/26/2016: ALT 19; B Natriuretic Peptide 785.0; Hemoglobin 11.6; Platelets 179 02/27/2016: BUN 15; Creatinine, Ser 0.54; Potassium 3.2; Sodium 136   Lipid Panel    Component Value Date/Time   CHOL 113 02/24/2016 0923   TRIG 83 02/24/2016 0923   HDL 36 (L) 02/24/2016 0923   CHOLHDL 5.1 05/09/2008 0332   VLDL 42 (H) 05/09/2008 0332   LDLCALC 60 02/24/2016 0923    Additional studies/ records that were reviewed today include:   2-D echo 02/26/16 Study Conclusions   - Left ventricle: The cavity size was mildly dilated. Wall   thickness was increased in a pattern of mild LVH. Systolic   function was severely reduced. The estimated ejection fraction   was in the range of 20% to 25%. Diffuse hypokinesis. Features are   consistent with a pseudonormal left ventricular filling pattern,   with concomitant abnormal relaxation and increased filling   pressure (grade 2 diastolic dysfunction). Doppler parameters are   consistent with high ventricular filling pressure. - Mitral valve: There was mild regurgitation. - Left atrium: The  atrium was moderately to severely dilated. - Right atrium: The atrium was moderately dilated.   -------------------------------------------------------------------     ASSESSMENT:    1. Cardiomyopathy, nonischemic ([place])   2. Chronic systolic heart failure ([place])   3. Biventricular automatic implantable cardioverter defibrillator in situ   4. Essential hypertension   5. Nausea      PLAN:  In order of problems listed above: Nonischemic cardiomyopathy EF 20-25%. Compensated on current meds. Follow-up with Dr. Harl Bowie in December or sooner if needed.  Chronic systolic heart failure stable on Demadex 20 mg twice a day. Also on low-dose Aldactone and losartan. For blood work today  By the defibrillator for remote check October 9  Essential hypertension controlled  Nausea and abdominal swelling since discharge. This could be related to taking medications on an empty stomach, particularly potassium. Decreased potassium to 2 tablets daily with food. Checking labs today. If nausea continues recommend follow-up with Dr. Luan Pulling.    Medication Adjustments/Labs and Tests Ordered: Current medicines are reviewed at length with the patient today.  Concerns regarding medicines are outlined above.  Medication changes, Labs and Tests ordered today are listed in the Patient Instructions below. Patient Instructions  Your physician recommends that you schedule a follow-up appointment with Dr. Harl Bowie  Your physician recommends that you continue on your current medications as directed. Please refer to the Current Medication list given to you today.  Take your medication with food.   If you need a refill on your cardiac medications before your next appointment, please call your pharmacy.  Thank you for choosing [facility]!       Signed, Ermalinda Barrios, PA-C  03/09/2016 2:56 PM    [facility] [address], [place], [place]  [address] Phone: [phone];  Fax: [phone]

## 2016-03-09 NOTE — Patient Outreach (Signed)
[place] [facility]) Care Management  03/09/2016  Donna [name] [birth year] [id number]  Unable to reach [name] by phone.   Plan: I will follow up with [name] by phone next week.    [place] Management  [phone]

## 2016-03-14 ENCOUNTER — Ambulatory Visit (INDEPENDENT_AMBULATORY_CARE_PROVIDER_SITE_OTHER): Payer: PPO | Admitting: *Deleted

## 2016-03-14 DIAGNOSIS — I5022 Chronic systolic (congestive) heart failure: Secondary | ICD-10-CM

## 2016-03-14 DIAGNOSIS — I428 Other cardiomyopathies: Secondary | ICD-10-CM | POA: Diagnosis not present

## 2016-03-14 NOTE — Progress Notes (Signed)
Remote ICD transmission.   

## 2016-03-15 ENCOUNTER — Encounter: Payer: Self-pay | Admitting: Cardiology

## 2016-03-17 ENCOUNTER — Encounter: Payer: Self-pay | Admitting: *Deleted

## 2016-03-17 ENCOUNTER — Other Ambulatory Visit: Payer: Self-pay | Admitting: *Deleted

## 2016-03-17 NOTE — Patient Outreach (Signed)
[facility]) Care Management  03/17/2016  HODAYA BLANCHFIELD 05-22-1941 [id number]  Donna [name] is a 89 year olf delightful lady with recent hospital admission for acute on chronic systolic heart failure and history of ventricular tachycardia s/p AICD/BiV pacemaker device placement, diabetes mellitus, type II, and acute respiratory failure with hypoxia.  I have been following [name] since her most recent discharge from the hospital but was unable to reach her by phone. I left a HIPPA compliant voice message requesting a return call.   Plan: I will reach out to [name] by phone next week.    [place] Management  [phone]

## 2016-03-18 ENCOUNTER — Other Ambulatory Visit: Payer: Self-pay

## 2016-03-18 MED ORDER — POTASSIUM CHLORIDE 20 MEQ PO PACK: 20.0000 meq | PACK | Freq: Two times a day (BID) | ORAL | 1 refills | Status: DC

## 2016-03-18 NOTE — Telephone Encounter (Signed)
Pt had bottle with dr Luan Pulling name and sig for K= was 20 meq 2 am and 1 tab pm,Per M Lenze,pt should be on 20 meq twice a day,refill e-scribed to pharmacy

## 2016-03-24 ENCOUNTER — Ambulatory Visit: Payer: Self-pay | Admitting: *Deleted

## 2016-03-25 ENCOUNTER — Other Ambulatory Visit: Payer: Self-pay | Admitting: *Deleted

## 2016-03-25 NOTE — Patient Outreach (Signed)
[place] [facility]) Care Management  03/25/2016  Donna [name] Aug 25, 1940 [id number]  I spoke with Donna [name] today for continued transition of care assessment and care coordination related to her most recent admission for CHF.   Today, Donna [name] says she is symptom free other than feeling generally "less energetic" than before her admission. Donna [name] has seen her primary care provider and is scheduled to see her cardiologist (previously scheduled) in December. She is weighing and recording daily and says today her weight is 118lb 4oz. She denies swelling, shortness of breath, chest pain, nausea, lightheadedness, or other symptom.   Plan: I will reach out to Donna [name] late next week to assess her progress.    [place] Management  [phone]

## 2016-04-01 ENCOUNTER — Encounter: Payer: Self-pay | Admitting: *Deleted

## 2016-04-01 ENCOUNTER — Other Ambulatory Visit: Payer: Self-pay | Admitting: *Deleted

## 2016-04-01 NOTE — Patient Outreach (Addendum)
[place] [facility]) Care Management  04/01/2016  Richanda Schauman [place] [birth year] [id number]   I reached out to Mrs. Olaes this afternoon for ongoing transition of care assessment. She says she feels she is doing "okay" but feels like she's "not bouncing back". Mrs. Dauber denied any acute symptoms but said she has had "a funny feeling" in her chest a time or two over the last week. She denied dizziness, lightheadedness, chest pain, nausea, or other symptom.   Mrs. Dau said she forgot to take her evening medications yesterday and was concerned about whether she should make up her missed doses by adding them today. I advised that she continue with her regular medication routine, particularly if she was asymptomatic and her weight had not increased. Her weight today was just over 118#, within 3 pounds of her usual dry weight.   Plan: Mrs. Noda will call her doctor for any new symptoms. She will take all medications as prescribed. I will reach out to Mrs. Bynum again next week for ongoing transition of care assessments.    [place] Management  [phone]

## 2016-04-08 LAB — CUP PACEART REMOTE DEVICE CHECK
Battery Remaining Longevity: 69 mo
Battery Voltage: 2.98 V
Brady Statistic RA Percent Paced: 80 %
HIGH POWER IMPEDANCE MEASURED VALUE: 62 Ohm
HIGH POWER IMPEDANCE MEASURED VALUE: 62 Ohm
Implantable Lead Implant Date: 20160614
Implantable Lead Implant Date: 20160614
Implantable Lead Location: 753858
Implantable Lead Location: 753859
Implantable Lead Location: 753860
Implantable Pulse Generator Implant Date: 20160614
Lead Channel Impedance Value: 1100 Ohm
Lead Channel Impedance Value: 460 Ohm
Lead Channel Pacing Threshold Amplitude: 0.5 V
Lead Channel Pacing Threshold Amplitude: 1.25 V
Lead Channel Pacing Threshold Pulse Width: 0.5 ms
Lead Channel Sensing Intrinsic Amplitude: 11.7 mV
Lead Channel Setting Pacing Amplitude: 2.25 V
Lead Channel Setting Sensing Sensitivity: 0.5 mV
MDC IDC LEAD IMPLANT DT: 20160614
MDC IDC LEAD MODEL: 7122
MDC IDC MSMT BATTERY REMAINING PERCENTAGE: 81 %
MDC IDC MSMT LEADCHNL LV PACING THRESHOLD PULSEWIDTH: 0.5 ms
MDC IDC MSMT LEADCHNL RA IMPEDANCE VALUE: 480 Ohm
MDC IDC MSMT LEADCHNL RA PACING THRESHOLD AMPLITUDE: 0.5 V
MDC IDC MSMT LEADCHNL RA SENSING INTR AMPL: 1.7 mV
MDC IDC MSMT LEADCHNL RV PACING THRESHOLD PULSEWIDTH: 0.5 ms
MDC IDC PG SERIAL: 1210376
MDC IDC SESS DTM: 20171009060017
MDC IDC SET LEADCHNL LV PACING PULSEWIDTH: 0.5 ms
MDC IDC SET LEADCHNL RA PACING AMPLITUDE: 2 V
MDC IDC SET LEADCHNL RV PACING AMPLITUDE: 2.5 V
MDC IDC SET LEADCHNL RV PACING PULSEWIDTH: 0.5 ms
MDC IDC STAT BRADY AP VP PERCENT: 79 %
MDC IDC STAT BRADY AP VS PERCENT: 1 %
MDC IDC STAT BRADY AS VP PERCENT: 19 %
MDC IDC STAT BRADY AS VS PERCENT: 1 %

## 2016-05-03 ENCOUNTER — Other Ambulatory Visit: Payer: Self-pay | Admitting: *Deleted

## 2016-05-03 NOTE — Patient Outreach (Signed)
[facility]) Care Management  05/03/2016  NI ASIEDU July 20, 1940 [id number]  Unable to reach Mrs. Inks by phone today to follow up on her CHF management and general health.   Plan: I will reach out to Mrs. Mckoy again by phone in the next 2 weeks.    [place] Management  [phone]

## 2016-05-09 ENCOUNTER — Ambulatory Visit: Payer: Self-pay | Admitting: Cardiology

## 2016-05-12 ENCOUNTER — Encounter: Payer: Self-pay | Admitting: Cardiology

## 2016-05-12 ENCOUNTER — Ambulatory Visit (INDEPENDENT_AMBULATORY_CARE_PROVIDER_SITE_OTHER): Payer: PPO | Admitting: Cardiology

## 2016-05-12 VITALS — BP 98/66 | HR 73 | Ht 61.0 in | Wt 119.0 lb

## 2016-05-12 DIAGNOSIS — I5022 Chronic systolic (congestive) heart failure: Secondary | ICD-10-CM

## 2016-05-12 DIAGNOSIS — I472 Ventricular tachycardia: Secondary | ICD-10-CM | POA: Diagnosis not present

## 2016-05-12 DIAGNOSIS — I4729 Other ventricular tachycardia: Secondary | ICD-10-CM

## 2016-05-12 MED ORDER — TORSEMIDE 20 MG PO TABS: 20.0000 mg | ORAL_TABLET | Freq: Every day | ORAL | 3 refills | Status: DC

## 2016-05-12 NOTE — Patient Instructions (Signed)
Medication Instructions:  DECREASE TORSEMIDE TO 20 MG - 1 TIME DAILY   Labwork: NONE  Testing/Procedures: NONE  Follow-Up: Your physician recommends that you schedule a follow-up appointment in: 2 WEEKS    Any Other Special Instructions Will Be Listed Below (If Applicable).     If you need a refill on your cardiac medications before your next appointment, please call your pharmacy.

## 2016-05-12 NOTE — Progress Notes (Signed)
Clinical Summary Donna [name] is a 75 y.o.female seen today for follow up of the following medical problems  1. NICM  - long history of cardiomyopathy, prior caths without obstructive disease  - LVEF 20% by echo 06/2011,  - echo 10/2014 LVEF 15-20%.  - she has BiV AICD followed by Dr Lovena Le.    -she has chronic fatigue and dizziness, we have been downtitrating her CHF meds to see if symptoms improve, - continues to have dizziness at times. Mainly with walking. Has been limiting fluid intake - home weights around 116 lbs. Drinking 5 cups of water daily.    2. Ventricular tachycardia  - followed by EP. She has BiV AICD.  - no recent palpitations     Past Medical History:  Diagnosis Date  . AICD (automatic cardioverter/defibrillator) present   . Arthritis    "fingers" (11/18/2014)  . Atrial flutter (HCC)    Versus ventricular tachycardia; treated with amiodarone  . Cardiomyopathy, nonischemic ([place])    Nl cors in [place]; CHF in 12/97; EF of 40% in 5/01 and 7/03, 25% in 9/04 and 4/08.  systolic murmur without valvular abnormalities by echo; refused automatic implantable cardiac defibrillator  . Cerebrovascular disease    Duplex study in 12/2006 shows atherosclerosis without focal stenosis  . CHF (congestive heart failure) ([place])   . Diabetes mellitus, type II ([place])    No insulin  . Gastroesophageal reflux disease   . Hyperlipidemia   . Hypertension   . Left bundle Abbe Bula block   . Ventricular tachycardia ([place])    a. PMH it lists "atrial flutter versus ventricular tachycardia treated with amiodarone" - details of this are unclear. Notes from back to 2006 indicate she has been maintained on amiodarone but do not describe further indication. Patient's sister reports pt was told she had skipped beats that could cause her to drop dead - Dr. Lattie Haw put her on amiodarone and recommended a defibrillator.     Allergies  Allergen Reactions  . Codeine Other (See  Comments)    Reaction:  Racing heart   . Decongestant [Pseudoephedrine Hcl Er] Other (See Comments)    Reaction:  Racing heart      Current Outpatient Prescriptions  Medication Sig Dispense Refill  . acetaminophen (TYLENOL) 500 MG tablet Take 500 mg by mouth every 6 (six) hours as needed.    Marland Kitchen aspirin 81 MG tablet Take 81 mg by mouth daily.    Marland Kitchen atorvastatin (LIPITOR) 20 MG tablet Take 1 tablet (20 mg total) by mouth daily at 6 PM. 30 tablet 12  . calcium gluconate 500 MG tablet Take 500-1,000 mg by mouth 2 (two) times daily. 2 tablets in the morning and 1 tablet at night.    . carvedilol (COREG) 3.125 MG tablet TAKE ONE TABLET TWICE DAILY 60 tablet 11  . Cholecalciferol (VITAMIN D PO) Take 600 mg by mouth 2 (two) times daily.    . fish oil-omega-3 fatty acids 1000 MG capsule Take 1 capsule by mouth 3 (three) times daily.      . insulin detemir (LEVEMIR) 100 UNIT/ML injection Inject 12 Units into the skin at bedtime.    Marland Kitchen levothyroxine (SYNTHROID, LEVOTHROID) 75 MCG tablet Take 75 mcg by mouth daily before breakfast.    . linagliptin (TRADJENTA) 5 MG TABS tablet Take 5 mg by mouth daily.    Marland Kitchen losartan (COZAAR) 25 MG tablet Take 0.5 tablets (12.5 mg total) by mouth daily. (Patient taking differently: Take 25 mg  by mouth daily. ) 90 tablet 3  . omeprazole (PRILOSEC) 20 MG capsule Take 20 mg by mouth 2 (two) times daily.      . potassium chloride (KLOR-CON) 20 MEQ packet Take 20 mEq by mouth 2 (two) times daily. 60 tablet 1  . spironolactone (ALDACTONE) 25 MG tablet Take 12.5 mg by mouth daily.      Marland Kitchen torsemide (DEMADEX) 20 MG tablet Take 1 tablet (20 mg total) by mouth 2 (two) times daily. 180 tablet 3  . UNIFINE PENTIPS 32G X 4 MM MISC USE TWICE A DAY TO TAKE INSULIN. 100 each 2   No current facility-administered medications for this visit.      Past Surgical History:  Procedure Laterality Date  . ABDOMINAL HYSTERECTOMY  2006  . BI-VENTRICULAR IMPLANTABLE CARDIOVERTER DEFIBRILLATOR   (CRT-D)  11/18/2014  . COLONOSCOPY  2008  . EP IMPLANTABLE DEVICE N/A 11/18/2014   Procedure: BiV ICD Insertion CRT-D;  Surgeon: Evans Lance, MD;  Location: [facility];  Service: Cardiovascular;  Laterality: N/A;  . HERNIA REPAIR Right   . TONSILLECTOMY       Allergies  Allergen Reactions  . Codeine Other (See Comments)    Reaction:  Racing heart   . Decongestant [Pseudoephedrine Hcl Er] Other (See Comments)    Reaction:  Racing heart       Family History  Problem Relation Age of Onset  . Diabetes Mother   . Kidney disease Mother   . Diabetes Sister   . Heart attack Brother      Social History Donna [name] reports that she has never smoked. She has never used smokeless tobacco. Donna [name] reports that she does not drink alcohol.   Review of Systems CONSTITUTIONAL: No weight loss, fever, chills, weakness or fatigue.  HEENT: Eyes: No visual loss, blurred vision, double vision or yellow sclerae.No hearing loss, sneezing, congestion, runny nose or sore throat.  SKIN: No rash or itching.  CARDIOVASCULAR: per hpi RESPIRATORY: No shortness of breath, cough or sputum.  GASTROINTESTINAL: No anorexia, nausea, vomiting or diarrhea. No abdominal pain or blood.  GENITOURINARY: No burning on urination, no polyuria NEUROLOGICAL: No headache, dizziness, syncope, paralysis, ataxia, numbness or tingling in the extremities. No change in bowel or bladder control.  MUSCULOSKELETAL: No muscle, back pain, joint pain or stiffness.  LYMPHATICS: No enlarged nodes. No history of splenectomy.  PSYCHIATRIC: No history of depression or anxiety.  ENDOCRINOLOGIC: No reports of sweating, cold or heat intolerance. No polyuria or polydipsia.  Marland Kitchen   Physical Examination Vitals:   05/12/16 0920  BP: 98/66  Pulse: 73   Vitals:   05/12/16 0920  Weight: 119 lb (54 kg)  Height: 5\' 1"  (1.549 m)    Gen: resting comfortably, no acute distress HEENT: no scleral icterus, pupils equal round  and reactive, no palptable cervical adenopathy,  CV: RRR, no m/r/g, no jvd Resp: Clear to auscultation bilaterally GI: abdomen is soft, non-tender, non-distended, normal bowel sounds, no hepatosplenomegaly MSK: extremities are warm, no edema.  Skin: warm, no rash Neuro:  no focal deficits Psych: appropriate affect   Diagnostic Studies 06/2011 Echo LVEF 20%, mildly dilated, mild LVH, grade I diastolic dysfunction, mild to mod MR, PASP 36   10/04/13 Echo Study Conclusions  - Left ventricle: Left ventricular systolic function is severely reduced, EF 15-20%. Severe global hypokinesis to akinesis is seen. The cavity size was moderately to severely dilated. Wall thickness was increased in a pattern of mild LVH. Doppler parameters are  consistent with abnormal left ventricular relaxation (grade 1 diastolic dysfunction). Doppler parameters are consistent with high ventricular filling pressure. - Regional wall motion abnormality: Akinesis of the mid anterior, mid anteroseptal, basal-mid inferoseptal, apical septal, and apical myocardium; severe hypokinesis of the mid inferior and apical lateral myocardium; moderate hypokinesis of the mid inferolateral and basal-mid anterolateral myocardium. - Aortic valve: Mildly calcified annulus. Mildly thickened leaflets. - Mitral valve: Mildly dilated annulus. Mildly thickened leaflets . Mild to moderate regurgitation. - Left atrium: The atrium was moderately dilated. - Tricuspid valve: Mild regurgitation. - Pulmonary arteries: PA peak pressure: 81mm Hg (S). Mildly elevated pulmonary pressures. - Systemic veins: IVC is dilated, with normal respiratory variation. Estimated CVP 8 mmHg. - Pericardium, extracardiac: A trivial pericardial effusion was identified posterior to the heart.  10/2014 Echo Study Conclusions  - Left ventricle: There is global hypokinesis with akinesis of the anterior, inferior walls and paradoxical septal motion.  Overall LVEF is severely refused estimated at 15-20%. The cavity size was mildly dilated. There was moderate concentric hypertrophy. Systolic function was severely reduced. The estimated ejection fraction was in the range of 15-20%. Features are consistent with a pseudonormal left ventricular filling pattern, with concomitant abnormal relaxation and increased filling pressure (grade 2 diastolic dysfunction). Doppler parameters are consistent with elevated ventricular end-diastolic filling pressure. - Ventricular septum: Septal motion showed paradox. - Aortic valve: Trileaflet; normal thickness leaflets. There was no regurgitation. - Aortic root: The aortic root was normal in size. - Mitral valve: Structurally normal valve. There was moderate regurgitation. - Left atrium: The atrium was normal in size. - Right ventricle: Systolic function was mildly reduced. - Right atrium: The atrium was normal in size. - Tricuspid valve: There was mild regurgitation. - Pulmonary arteries: Systolic pressure was within the normal range. PA peak pressure: 34 mm Hg (S). - Inferior vena cava: The vessel was normal in size. - Pericardium, extracardiac: A trivial pericardial effusion was identified.    Assessment and Plan  1. NICM/Chronic systolic HF  - no recent edema or SOB. Ongoing orthostatic dizziness - we will decrease her torsemide to once day, increase fluid intake to 6 cups per day.   2. Ventricular tachycardia  - per EP, she has BiV AICD - no recent symptoms.           Arnoldo Lenis, M.D.

## 2016-05-17 ENCOUNTER — Other Ambulatory Visit: Payer: Self-pay

## 2016-05-17 MED ORDER — CARVEDILOL 3.125 MG PO TABS: 3.1250 mg | ORAL_TABLET | Freq: Two times a day (BID) | ORAL | 11 refills | Status: DC

## 2016-05-19 ENCOUNTER — Other Ambulatory Visit: Payer: Self-pay | Admitting: *Deleted

## 2016-05-19 NOTE — Patient Outreach (Signed)
[facility]) Care Management  05/19/2016  SHANECE HAMBRIC [birth year] [id number]   Donna [name] is a 49 year olf delightful lady who has had 2 hospital admissions in the last 6 months. Both times she was admitted with CHF. [name] has a history of ventricular tachycardia s/p AICD/BiV pacemaker device placement, diabetes mellitus, type II, and acute respiratory failure with hypoxia.  [name] lives in [place] in her home with an adult grandson. Her sister also lives in the area and has a close and supportive relationship with [name].   I reached out to [name] by phone today to follow up on her cardiovascular health. She reports having been to the eye doctor last month and gotten a good report. She also saw Dr. Harl Bowie in the office on   She reported feeling more fatigued and weaker than usual. Her blood pressure in the office was 98/66 and her pulse was 73. She weighed 119lb. On this visit, Dr. Harl Bowie decreased her Torsemide dose to once daily and asked her to add one additional cup of water to her very strictly monitored 5 cups/day. She reports that she made the medication change and is measuring the additional cup of water and adding to her intake.   [name] has been monitoring her bp and pulse at home and reports the following last 3 findings (she didn't have her book right in front of her and remembered her systolic pressures exactly but couldn't recall the exact # for her diastolic pressures or pulse):    115/60's pulse 70's 118/60's pulse 70's 105/60's pulse 70's  Weight 117lb this morning.   [name] reports mild intermittent insomnia and some night time wakefulness but otherwise says she feels well but still "worn out" more than usual. She denies chest pain, shortness of breath, dizziness, lightheadedness, or other symptom.   She has a follow up appointment on 05/26/16 3:10pm with [name] NP @ [facility]  [place].    Plan: I will follow up with [name] by phone after her next cardiology appointment.   [name] will continue daily monitoring of her bp and pulse. She will continue her once daily Demadex dose as ordered. She will continue 6 total cups of liquids throughout a 24h period.   [name] will call the cardiologist for any new or worsened symptoms.    [facility]  [phone]

## 2016-05-26 ENCOUNTER — Encounter: Payer: Self-pay | Admitting: Adult Health

## 2016-05-26 ENCOUNTER — Ambulatory Visit (INDEPENDENT_AMBULATORY_CARE_PROVIDER_SITE_OTHER): Payer: PPO | Admitting: Adult Health

## 2016-05-26 VITALS — BP 108/68 | HR 78 | Ht 61.0 in | Wt 120.0 lb

## 2016-05-26 DIAGNOSIS — Z79899 Other long term (current) drug therapy: Secondary | ICD-10-CM

## 2016-05-26 DIAGNOSIS — I428 Other cardiomyopathies: Secondary | ICD-10-CM | POA: Diagnosis not present

## 2016-05-26 DIAGNOSIS — I10 Essential (primary) hypertension: Secondary | ICD-10-CM | POA: Diagnosis not present

## 2016-05-26 MED ORDER — SPIRONOLACTONE 25 MG PO TABS: 12.5000 mg | ORAL_TABLET | Freq: Every day | ORAL | 3 refills | Status: DC

## 2016-05-26 NOTE — Patient Instructions (Addendum)
Your physician recommends that you schedule a follow-up appointment in: 1 Month   Your physician recommends that you return for lab work in: 1 month   Your physician recommends that you continue on your current medications as directed. Please refer to the Current Medication list given to you today.  If you need a refill on your cardiac medications before your next appointment, please call your pharmacy.  Thank you for choosing [facility]!

## 2016-05-26 NOTE — Progress Notes (Signed)
Cardiology Office Note   Date:  05/26/2016   ID:  Donna [name], Donna [name] August 23, 1940, MRN [id number]  PCP:  Alonza Bogus, MD  Cardiologist:  Cloria Spring, NP   Chief Complaint  Patient presents with  . Hypertension  . Cardiomyopathy      History of Present Illness: Donna [name] is a 75 y.o. female who presents for ongoing assessment and management of nonischemic cardiomyopathy, prior cardiac catheterizations did not reveal obstructive coronary artery disease. Most recent echocardiogram revealed EF of 20-25% in Sept of 2017. The patient also has a by BiV- AICD followed by Dr. Lovena Le. The patient was last seen by Dr. Harl Bowie on 05/12/2016, at that time the patient was having ongoing orthostatic dizziness and therefore torsemide was decreased to once a day and the patient was to increase fluid intake to 6 cups of fluid per day.  10/2014 Echo Study Conclusions  - Left ventricle: There is global hypokinesis with akinesis of the anterior, inferior walls and paradoxical septal motion. Overall LVEF is severely refused estimated at 15-20%. The cavity size was mildly dilated. There was moderate concentric hypertrophy. Systolic function was severely reduced. The estimated ejection fraction was in the range of 15-20%. Features are consistent with a pseudonormal left ventricular filling pattern, with concomitant abnormal relaxation and increased filling pressure (grade 2 diastolic dysfunction). Doppler parameters are consistent with elevated ventricular end-diastolic filling pressure. - Ventricular septum: Septal motion showed paradox. - Aortic valve: Trileaflet; normal thickness leaflets. There was no regurgitation. - Aortic root: The aortic root was normal in size. - Mitral valve: Structurally normal valve. There was moderate regurgitation. - Left atrium: The atrium was normal in size. - Right ventricle: Systolic function was mildly reduced. - Right  atrium: The atrium was normal in size. - Tricuspid valve: There was mild regurgitation. - Pulmonary arteries: Systolic pressure was within the normal range. PA peak pressure: 34 mm Hg (S). - Inferior vena cava: The vessel was normal in size. - Pericardium, extracardiac: A trivial pericardial effusion was identified.  She comes today with multiple somatic complaints. She is tired and feels weak all of the time. She is confused on her medication regimen and is uncertain of doses. She states her family members help her with her medications. She has recently been diagnosed with iron deficiency and has been place on iron replacement.   Past Medical History:  Diagnosis Date  . AICD (automatic cardioverter/defibrillator) present   . Arthritis    "fingers" (11/18/2014)  . Atrial flutter (HCC)    Versus ventricular tachycardia; treated with amiodarone  . Cardiomyopathy, nonischemic ([place])    Nl cors in [place]; CHF in 12/97; EF of 40% in 5/01 and 7/03, 25% in 9/04 and 4/08.  systolic murmur without valvular abnormalities by echo; refused automatic implantable cardiac defibrillator  . Cerebrovascular disease    Duplex study in 12/2006 shows atherosclerosis without focal stenosis  . CHF (congestive heart failure) ([place])   . Diabetes mellitus, type II ([place])    No insulin  . Gastroesophageal reflux disease   . Hyperlipidemia   . Hypertension   . Left bundle branch block   . Ventricular tachycardia ([place])    a. PMH it lists "atrial flutter versus ventricular tachycardia treated with amiodarone" - details of this are unclear. Notes from back to 2006 indicate she has been maintained on amiodarone but do not describe further indication. Patient's sister reports pt was told she had skipped beats that could cause her to drop dead -  Dr. Lattie Haw put her on amiodarone and recommended a defibrillator.    Past Surgical History:  Procedure Laterality Date  . ABDOMINAL HYSTERECTOMY  2006  .  BI-VENTRICULAR IMPLANTABLE CARDIOVERTER DEFIBRILLATOR  (CRT-D)  11/18/2014  . COLONOSCOPY  2008  . EP IMPLANTABLE DEVICE N/A 11/18/2014   Procedure: BiV ICD Insertion CRT-D;  Surgeon: Evans Lance, MD;  Location: [facility];  Service: Cardiovascular;  Laterality: N/A;  . HERNIA REPAIR Right   . TONSILLECTOMY       Current Outpatient Prescriptions  Medication Sig Dispense Refill  . acetaminophen (TYLENOL) 500 MG tablet Take 500 mg by mouth every 6 (six) hours as needed.    Marland Kitchen aspirin 81 MG tablet Take 81 mg by mouth daily.    Marland Kitchen atorvastatin (LIPITOR) 20 MG tablet Take 1 tablet (20 mg total) by mouth daily at 6 PM. 30 tablet 12  . calcium gluconate 500 MG tablet Take 500-1,000 mg by mouth 2 (two) times daily. 2 tablets in the morning and 1 tablet at night.    . carvedilol (COREG) 3.125 MG tablet Take 1 tablet (3.125 mg total) by mouth 2 (two) times daily. 60 tablet 11  . Cholecalciferol (VITAMIN D PO) Take 600 mg by mouth 2 (two) times daily.    . fish oil-omega-3 fatty acids 1000 MG capsule Take 1 capsule by mouth 3 (three) times daily.      . insulin detemir (LEVEMIR) 100 UNIT/ML injection Inject 12 Units into the skin at bedtime.    Marland Kitchen levothyroxine (SYNTHROID, LEVOTHROID) 75 MCG tablet Take 75 mcg by mouth daily before breakfast.    . linagliptin (TRADJENTA) 5 MG TABS tablet Take 5 mg by mouth daily.    Marland Kitchen omeprazole (PRILOSEC) 20 MG capsule Take 20 mg by mouth 2 (two) times daily.      . potassium chloride (KLOR-CON) 20 MEQ packet Take 20 mEq by mouth 2 (two) times daily. 60 tablet 1  . spironolactone (ALDACTONE) 25 MG tablet Take 0.5 tablets (12.5 mg total) by mouth daily. 45 tablet 3  . torsemide (DEMADEX) 20 MG tablet Take 1 tablet (20 mg total) by mouth daily. 180 tablet 3  . UNIFINE PENTIPS 32G X 4 MM MISC USE TWICE A DAY TO TAKE INSULIN. 100 each 2  . losartan (COZAAR) 25 MG tablet Take 0.5 tablets (12.5 mg total) by mouth daily. (Patient taking differently: Take 25 mg by  mouth daily. ) 90 tablet 3   No current facility-administered medications for this visit.     Allergies:   Codeine and Decongestant [pseudoephedrine hcl er]    Social History:  The patient  reports that she has never smoked. She has never used smokeless tobacco. She reports that she does not drink alcohol or use drugs.   Family History:  The patient's family history includes Diabetes in her mother and sister; Heart attack in her brother; Kidney disease in her mother.    ROS: All other systems are reviewed and negative. Unless otherwise mentioned in H&P    PHYSICAL EXAM: VS:  BP 108/68   Pulse 78   Ht 5\' 1"  (1.549 m)   Wt 120 lb (54.4 kg)   SpO2 96%   BMI 22.67 kg/m  , BMI Body mass index is 22.67 kg/m. GEN: Well nourished, well developed, in no acute distress  HEENT: normal  Neck: no JVD, carotid bruits, or masses Cardiac:RRR; no murmurs, rubs, or gallops,no edema  Respiratory:  Clear to auscultation bilaterally, normal work of breathing  GI: soft, nontender, nondistended, + BS MS: no deformity or atrophy  Skin: warm and dry, no rash Neuro:  Strength and sensation are intact Psych: euthymic mood, full affect  Recent Labs: 02/24/2016: TSH 4.800 02/26/2016: ALT 19; B Natriuretic Peptide 785.0; Hemoglobin 11.6; Platelets 179 03/09/2016: BUN 17; Creatinine, Ser 0.76; Magnesium 2.3; Potassium 3.7; Sodium 135    Lipid Panel    Component Value Date/Time   CHOL 113 02/24/2016 0923   TRIG 83 02/24/2016 0923   HDL 36 (L) 02/24/2016 0923   CHOLHDL 5.1 05/09/2008 0332   VLDL 42 (H) 05/09/2008 0332   LDLCALC 60 02/24/2016 0923      Wt Readings from Last 3 Encounters:  05/26/16 120 lb (54.4 kg)  05/12/16 119 lb (54 kg)  04/01/16 118 lb 4 oz (53.6 kg)     ASSESSMENT AND PLAN:  1. NICM: She is very confused on her medication regimen and is taking higher doses than is prescribed. I have gone over all of her medications and marked her bottles. She is to be on 1/2 tablet of  spironolactone (12.5 mg) daily but she is taking 25 mg daily. She is to be on 12. 5 mg of losartan is taking 25 mg daily. She is NOT to be on amiodarone but is taking 200 mg daily. I have spent over 30 minutes with this patient going over her mediations and explaining to her what she should be taking. I think she may be on too much medication causing her weakness and fatigue. I will see her again in one week with a family member to evaluate her response to medication changes.   2. AICD in situ: Recently interrogated on 10.09.2017. She is to follow protocol for interrogation   3. Systolic Dysfunction: Most recent echocardiogram 02/2016 with EF of 20-25%. She may benefit from change to [facility]. However, with confusion about mediations will wait to do this until I see how she responds to her medication changes next week.   4. Iron deficiency anemia: On iron replacement per PCP.    Current medicines are reviewed at length with the patient today.    Labs/ tests ordered today include:   Orders Placed This Encounter  Procedures  . Basic Metabolic Panel (BMET)     Disposition:   FU with Dr.Branch one week.  Signed, [name], NP  05/26/2016 5:43 PM    [facility] [address] [place], [place] [address] Phone: [phone]; Fax: [phone]

## 2016-05-26 NOTE — Progress Notes (Signed)
Name: Donna [name]    DOB: 23-Jun-1940  Age: 75 y.o.  MR#: [id number]       PCP:  Alonza Bogus, MD      Insurance: Payor: Tenn[employer] / Plan: Tennis Must / Product Type: *No Product type* /   CC:   No chief complaint on file.   VS Vitals:   05/26/16 1522  Weight: 120 lb (54.4 kg)  Height: 5\' 1"  (1.549 m)    Weights Current Weight  05/26/16 120 lb (54.4 kg)  05/12/16 119 lb (54 kg)  04/01/16 118 lb 4 oz (53.6 kg)    Blood Pressure  BP Readings from Last 3 Encounters:  05/12/16 98/66  03/09/16 112/66  02/27/16 (!) 117/59     Admit date:  (Not on file) Last encounter with RMR:  Visit date not found   Allergy Codeine and Decongestant [pseudoephedrine hcl er]  Current Outpatient Prescriptions  Medication Sig Dispense Refill  . acetaminophen (TYLENOL) 500 MG tablet Take 500 mg by mouth every 6 (six) hours as needed.    Marland Kitchen aspirin 81 MG tablet Take 81 mg by mouth daily.    Marland Kitchen atorvastatin (LIPITOR) 20 MG tablet Take 1 tablet (20 mg total) by mouth daily at 6 PM. 30 tablet 12  . calcium gluconate 500 MG tablet Take 500-1,000 mg by mouth 2 (two) times daily. 2 tablets in the morning and 1 tablet at night.    . carvedilol (COREG) 3.125 MG tablet Take 1 tablet (3.125 mg total) by mouth 2 (two) times daily. 60 tablet 11  . Cholecalciferol (VITAMIN D PO) Take 600 mg by mouth 2 (two) times daily.    . fish oil-omega-3 fatty acids 1000 MG capsule Take 1 capsule by mouth 3 (three) times daily.      . insulin detemir (LEVEMIR) 100 UNIT/ML injection Inject 12 Units into the skin at bedtime.    Marland Kitchen levothyroxine (SYNTHROID, LEVOTHROID) 75 MCG tablet Take 75 mcg by mouth daily before breakfast.    . linagliptin (TRADJENTA) 5 MG TABS tablet Take 5 mg by mouth daily.    Marland Kitchen omeprazole (PRILOSEC) 20 MG capsule Take 20 mg by mouth 2 (two) times daily.      . potassium chloride (KLOR-CON) 20 MEQ packet Take 20 mEq by mouth 2 (two) times daily. 60 tablet 1  . spironolactone  (ALDACTONE) 25 MG tablet Take 12.5 mg by mouth daily.      Marland Kitchen torsemide (DEMADEX) 20 MG tablet Take 1 tablet (20 mg total) by mouth daily. 180 tablet 3  . UNIFINE PENTIPS 32G X 4 MM MISC USE TWICE A DAY TO TAKE INSULIN. 100 each 2  . losartan (COZAAR) 25 MG tablet Take 0.5 tablets (12.5 mg total) by mouth daily. (Patient taking differently: Take 25 mg by mouth daily. ) 90 tablet 3   No current facility-administered medications for this visit.     Discontinued Meds:   There are no discontinued medications.  Patient Active Problem List   Diagnosis Date Noted  . Nausea 03/09/2016  . Dizziness 02/03/2016  . Fall on flat surface, tripped on rug; no injury 01/19/2016    Class: Acute  . Acute on chronic systolic CHF (congestive heart failure) ([place]) 12/29/2015  . Hypokalemia 05/18/2015  . Acute on chronic systolic congestive heart failure ([place]) 05/16/2015  . Acute on chronic systolic (congestive) heart failure 05/16/2015  . Chronic systolic heart failure ([place]) 11/18/2014  . NICM (nonischemic cardiomyopathy) ([place]) 11/18/2014  . Acute on chronic systolic heart  failure ([place]) 10/20/2014  . Acute respiratory failure with hypoxia ([place]) 10/18/2014  . CHF exacerbation ([place]) 07/22/2014  . Acute exacerbation of CHF (congestive heart failure) ([place]) 07/22/2014  . Abnormality of gait 09/26/2013  . Weakness of left hip 09/26/2013  . Diabetes mellitus, type II ([place]) 06/15/2011  . Cardiomyopathy, nonischemic ([place])   . Hypertension   . Hypothyroidism 05/28/2009  . Left bundle branch block 05/28/2009  . GASTROESOPHAGEAL REFLUX DISEASE 05/28/2009  . Hyperlipidemia 05/14/2008  . VENTRICULAR TACHYCARDIA 05/14/2008  . CEREBROVASCULAR DISEASE 05/14/2008    LABS    Component Value Date/Time   NA 135 03/09/2016 1505   NA 136 02/27/2016 0848   NA 134 (L) 02/26/2016 0245   NA 142 02/24/2016 0923   K 3.7 03/09/2016 1505   K 3.2 (L) 02/27/2016 0848   K 4.2 02/26/2016 0245   CL 99 (L) 03/09/2016 1505    CL 98 (L) 02/27/2016 0848   CL 104 02/26/2016 0245   CO2 29 03/09/2016 1505   CO2 29 02/27/2016 0848   CO2 27 02/26/2016 0245   GLUCOSE 121 (H) 03/09/2016 1505   GLUCOSE 166 (H) 02/27/2016 0848   GLUCOSE 179 (H) 02/26/2016 0245   BUN 17 03/09/2016 1505   BUN 15 02/27/2016 0848   BUN 19 02/26/2016 0245   BUN 18 02/24/2016 0923   CREATININE 0.76 03/09/2016 1505   CREATININE 0.54 02/27/2016 0848   CREATININE 0.63 02/26/2016 0821   CREATININE 0.51 08/21/2014 1012   CREATININE 0.57 12/06/2010 0816   CALCIUM 9.1 03/09/2016 1505   CALCIUM 8.6 (L) 02/27/2016 0848   CALCIUM 8.9 02/26/2016 0245   GFRNONAA >60 03/09/2016 1505   GFRNONAA >60 02/27/2016 0848   GFRNONAA >60 02/26/2016 0821   GFRAA >60 03/09/2016 1505   GFRAA >60 02/27/2016 0848   GFRAA >60 02/26/2016 0821   CMP     Component Value Date/Time   NA 135 03/09/2016 1505   NA 142 02/24/2016 0923   K 3.7 03/09/2016 1505   CL 99 (L) 03/09/2016 1505   CO2 29 03/09/2016 1505   GLUCOSE 121 (H) 03/09/2016 1505   BUN 17 03/09/2016 1505   BUN 18 02/24/2016 0923   CREATININE 0.76 03/09/2016 1505   CREATININE 0.51 08/21/2014 1012   CALCIUM 9.1 03/09/2016 1505   PROT 7.7 02/26/2016 0245   PROT 6.7 02/24/2016 0923   ALBUMIN 4.6 02/26/2016 0245   ALBUMIN 4.4 02/24/2016 0923   AST 24 02/26/2016 0245   ALT 19 02/26/2016 0245   ALKPHOS 58 02/26/2016 0245   BILITOT 0.5 02/26/2016 0245   BILITOT 0.5 02/24/2016 0923   GFRNONAA >60 03/09/2016 1505   GFRAA >60 03/09/2016 1505       Component Value Date/Time   WBC 9.3 02/26/2016 0821   WBC 11.5 (H) 02/26/2016 0245   WBC 6.8 02/24/2016 0923   WBC 8.4 12/29/2015 0529   HGB 11.6 (L) 02/26/2016 0821   HGB 12.3 02/26/2016 0245   HGB 12.5 12/29/2015 0529   HCT 35.7 (L) 02/26/2016 0821   HCT 37.7 02/26/2016 0245   HCT 33.3 (L) 02/24/2016 0923   HCT 37.7 12/29/2015 0529   MCV 97.8 02/26/2016 0821   MCV 98.7 02/26/2016 0245   MCV 95 02/24/2016 0923   MCV 97.7 12/29/2015 0529     Lipid Panel     Component Value Date/Time   CHOL 113 02/24/2016 0923   TRIG 83 02/24/2016 0923   HDL 36 (L) 02/24/2016 0923   CHOLHDL 5.1 05/09/2008 [id number]  VLDL 42 (H) 05/09/2008 0332   LDLCALC 60 02/24/2016 0923    ABG    Component Value Date/Time   PHART 7.415 12/29/2015 0230   PCO2ART 43.5 12/29/2015 0230   PO2ART 140.0 (H) 12/29/2015 0230   HCO3 27.0 (H) 12/29/2015 0230   TCO2 16.9 12/29/2015 0230   O2SAT 98.8 12/29/2015 0230     Lab Results  Component Value Date   TSH 4.800 (H) 02/24/2016   BNP (last 3 results)  Recent Labs  12/29/15 0116 02/26/16 0245  BNP 872.0* 785.0*    ProBNP (last 3 results) No results for input(s): PROBNP in the last 8760 hours.  Cardiac Panel (last 3 results) No results for input(s): CKTOTAL, CKMB, TROPONINI, RELINDX in the last 72 hours.  Iron/TIBC/Ferritin/ %Sat No results found for: IRON, TIBC, FERRITIN, IRONPCTSAT   EKG Orders placed or performed during the hospital encounter of 02/26/16  . ED EKG  . ED EKG  . EKG 12-Lead  . EKG 12-Lead  . EKG     Prior Assessment and Plan Problem List as of 05/26/2016 Reviewed: 05/19/2016 12:37 PM by Army Melia, RN     Cardiovascular and Mediastinum   Left bundle branch block   VENTRICULAR TACHYCARDIA   Last Assessment & Plan 02/19/2015 Office Visit Written 02/19/2015 10:42 AM by Evans Lance, MD    Since her ICD Implant, she has had no ventricular tachycardia. She will continue low dose amiodarone.      CEREBROVASCULAR DISEASE   Last Assessment & Plan 06/14/2012 Office Visit Written 06/15/2012 10:50 AM by Yehuda Savannah, MD    Asymptomatic carotid bruit with atherosclerosis but no focal stenosis on ultrasound study in 12/2006 No bruit heard in 2013-does not appear to be an active problem.        Cardiomyopathy, nonischemic [facility])   Last Assessment & Plan 06/14/2012 Office Visit Written 06/15/2012 10:49 AM by Yehuda Savannah, MD    The patient remains well compensated  with respect to nonischemic cardiomyopathy.  EF has been somewhat variable in the past, but has been consistently below 30% for at least the past decade.  Despite this, Ms. Zenon's functional status is excellent.  Current therapy will be maintained.  Electrolytes and renal function will be monitored.      Hypertension   Last Assessment & Plan 02/19/2015 Office Visit Written 02/19/2015 10:43 AM by Evans Lance, MD    Her blood pressure is well controlled. She will continue her current meds.       CHF exacerbation (HCC)   Acute exacerbation of CHF (congestive heart failure) ([place])   Acute on chronic systolic heart failure [facility])   Last Assessment & Plan 02/19/2015 Office Visit Written 02/19/2015 10:42 AM by Evans Lance, MD    Her symptoms are class 2. She will continue her current meds, increase her physical activity and reduce her sodium intake.      Chronic systolic heart failure (HCC)   NICM (nonischemic cardiomyopathy) (HCC)   Acute on chronic systolic congestive heart failure (HCC)   Acute on chronic systolic (congestive) heart failure   Acute on chronic systolic CHF (congestive heart failure) ([place])     Respiratory   Acute respiratory failure with hypoxia [facility])     Digestive   GASTROESOPHAGEAL REFLUX DISEASE     Endocrine   Hypothyroidism   Last Assessment & Plan 06/15/2011 Office Visit Edited 06/22/2011 12:12 PM by Yehuda Savannah, MD    Euthyroid clinically and chemically as assessed  by a normal TSH 7 months ago, on 0.075 mg of levothyroxine per day      Diabetes mellitus, type II [facility])   Last Assessment & Plan 06/14/2012 Office Visit Edited 06/17/2012 12:45 PM by Yehuda Savannah, MD    Control of diabetes has not been a major problem.  Recent CBGs of around 160 as reported by the patient suggests that there is still some room for improvement.        Other   Hyperlipidemia   Last Assessment & Plan 06/14/2012 Office Visit Written 06/15/2012 10:52 AM by Yehuda Savannah, MD     Lipid profile has been excellent with current therapy, most recently in 03/2011.  Dr. Luan Pulling is managing and monitoring this issue.      Abnormality of gait   Weakness of left hip   Hypokalemia   Fall on flat surface, tripped on rug; no injury   Dizziness   Nausea       Imaging: No results found.

## 2016-05-27 ENCOUNTER — Other Ambulatory Visit: Payer: Self-pay | Admitting: *Deleted

## 2016-05-27 NOTE — Patient Outreach (Signed)
[facility]) Care Management  05/27/2016  Donna [name] [birth year] [id number]  [name] is a 45 year olf delightful lady who has had 2 hospital admissions in the last 6 months. Both times she was admitted with CHF. Donna [name] has a history of ventricular tachycardia s/p AICD/BiV pacemaker device placement, diabetes mellitus, type II, and acute respiratory failure with hypoxia.  Donna [name] lives in [place] in her home with an adult grandson. Her sister also lives in the area and has a close and supportive relationship with Donna [name].   When we spoke last week, Donna [name] reported feeling more fatigued and weaker than usual. Her blood pressure in the office during a cardiology visit was 98/66 and her pulse was 73. She weighed 119lb. On that visit, Dr. Harl Bowie decreased her Torsemide dose to once daily and asked her to add one additional cup of water to her very strictly monitored 5 cups/day. She reports that she made the medication change and is measuring the additional cup of water and adding to her intake.   When Donna [name] visited [name] NP in the [facility] Cardiology [facility]) office yesterday, Ms. Lawrence noted that Donna [name] seemed to be quite confused about her medications and was not taking them correctly. This is new for Donna [name].    Plan:  I was unable to reach Donna [name] by phone today but will reach out to her again next week and schedule an appointment to see her at home AFTER she has seen Dr. Harl Bowie again in follow up. This way, I can follow through with her on any changes he may make to her regimen.    [place] Management  [phone]

## 2016-06-02 ENCOUNTER — Other Ambulatory Visit: Payer: Self-pay | Admitting: *Deleted

## 2016-06-02 NOTE — Patient Outreach (Signed)
[facility]) Care Management   06/02/2016  Donna [name] Dec 26, 1940 [id number]  Donna [name] is an 75 y.o. female who has had 2 hospital admissions in the last 6 months. Both times she was admitted with CHF. Mrs. Cowie has a history of ventricular tachycardia s/p AICD/BiV pacemaker device placement, diabetes mellitus, type II, and acute respiratory failure with hypoxia.  Mrs. Levett lives in [place] in her home with an adult grandson. Her sister also lives in the area and has a close and supportiverelationship with Mrs. Mckinlay.   I am seeing Mrs. Bellew at home today to follow up on her general condition, her weight and fluid intake/volume, and medication management.   Subjective: "I can't remember the last time I felt this run down. This is worse than the flu"  Objective:  BP 100/64   Pulse 63   SpO2 93%   Review of Systems  Constitutional: Positive for malaise/fatigue. Negative for chills, fever and weight loss.  HENT: Negative.   Eyes: Negative.   Respiratory: Negative for cough, sputum production, shortness of breath and wheezing.   Cardiovascular: Negative for chest pain, palpitations and leg swelling.  Gastrointestinal: Negative for abdominal pain, heartburn, nausea and vomiting.  Genitourinary: Negative.   Musculoskeletal: Positive for myalgias. Negative for falls.  Skin: Negative.   Neurological: Positive for weakness. Negative for dizziness, tingling, focal weakness and loss of consciousness.  Psychiatric/Behavioral: Negative.        Affect somewhat flat today compared with baseline; patient states she is "so tired and worn out I don't even feel like looking at anyone"    Physical Exam  Constitutional: She is oriented to person, place, and time. Vital signs are normal. She appears well-developed and well-nourished. She appears lethargic.  Non-toxic appearance. She has a sickly appearance. She does not appear ill. No distress.   Cardiovascular: Normal rate and regular rhythm.   Murmur heard. Respiratory: Effort normal and breath sounds normal. She has no wheezes. She has no rhonchi. She has no rales.  GI: Soft. Bowel sounds are normal.  Neurological: She is oriented to person, place, and time. She appears lethargic.  Skin: Skin is warm, dry and intact.  Psychiatric: Her speech is normal and behavior is normal. Judgment and thought content normal. Cognition and memory are normal.  Affect somewhat flat compared with baseline    Encounter Medications:   Outpatient Encounter Prescriptions as of 06/02/2016  Medication Sig Note  . acetaminophen (TYLENOL) 500 MG tablet Take 500 mg by mouth every 6 (six) hours as needed.   Marland Kitchen aspirin 81 MG tablet Take 81 mg by mouth daily.   Marland Kitchen atorvastatin (LIPITOR) 20 MG tablet Take 1 tablet (20 mg total) by mouth daily at 6 PM.   . calcium gluconate 500 MG tablet Take 500-1,000 mg by mouth 2 (two) times daily. 2 tablets in the morning and 1 tablet at night.   . carvedilol (COREG) 3.125 MG tablet Take 1 tablet (3.125 mg total) by mouth 2 (two) times daily.   . Cholecalciferol (VITAMIN D PO) Take 600 mg by mouth 2 (two) times daily.   . fish oil-omega-3 fatty acids 1000 MG capsule Take 1 capsule by mouth 3 (three) times daily.     . insulin detemir (LEVEMIR) 100 UNIT/ML injection Inject 12 Units into the skin at bedtime.   Marland Kitchen levothyroxine (SYNTHROID, LEVOTHROID) 75 MCG tablet Take 75 mcg by mouth daily before breakfast.   . linagliptin (TRADJENTA) 5 MG TABS tablet Take 5  mg by mouth daily. 02/26/2016: Patient receives Tradjenta from Dr. Luan Pulling' office.  Marland Kitchen losartan (COZAAR) 25 MG tablet Take 0.5 tablets (12.5 mg total) by mouth daily. (Patient taking differently: Take 25 mg by mouth daily. )   . omeprazole (PRILOSEC) 20 MG capsule Take 20 mg by mouth 2 (two) times daily.     . potassium chloride (KLOR-CON) 20 MEQ packet Take 20 mEq by mouth 2 (two) times daily.   Marland Kitchen spironolactone  (ALDACTONE) 25 MG tablet Take 0.5 tablets (12.5 mg total) by mouth daily.   Marland Kitchen torsemide (DEMADEX) 20 MG tablet Take 1 tablet (20 mg total) by mouth daily.   Marland Kitchen UNIFINE PENTIPS 32G X 4 MM MISC USE TWICE A DAY TO TAKE INSULIN.    Assessment:  Pleasant 75 year old lady with CHF and PPM/AICD living in [place] in her home with her adult grandson. Recent medication changes have been made and she is having difficulty with med management and adherence.    Acute/Chronic Health Condition (CHF) - When we last spoke, Mrs. Rhoten reported feeling more fatigued and weakerthan usual. Her blood pressure in the office during a recent cardiology visit was 98/66 and her pulse was 73. She weighed 119lb. On that visit, Dr. Harl Bowie decreased her Torsemide dose to once daily and asked her to add one additional cup of water to her very strictly monitored 5 cups/day. She reported that she made the medication change and is measuring the additional cup of water and adding to her intake.   When Mrs. Latu visited [name] NP in the [facility]) office last week, Ms. Lawrence noted that Mrs. Gonce seemed to be quite confused about her medications and was not taking them correctly. This is new for Mrs. Hoeger.   We reviewed medications today but her pill bottles are with her daughter who is now filling her med box with only one day's worth of medications. We have also reviewed signs and symptoms of worsening CHF using the STOP LIGHT tool and when to call for assistance.   I reached out to Ms. Lawrence to confirm follow up plans. I reached out to Dr. Luan Pulling office re: next follow up appointment.   Plan:   I will reach out to Mrs. Sill by phone next week and will hold a time slot open for a face to face visit with her should she need to be seen.   Mrs. Brunell will call Dr. Luan Pulling' office with any new or worsened symptoms.   Mrs. Frasier will continue to measure her weight and  blood pressure daily and record, calling her provider for findings outside established parameters or symptoms outside her baseline.   I will reach out to Mrs. Didonato next week to follow up on her symptoms and medication management.      [place] Management  [phone]

## 2016-06-07 ENCOUNTER — Other Ambulatory Visit: Payer: Self-pay | Admitting: *Deleted

## 2016-06-07 DIAGNOSIS — Z1211 Encounter for screening for malignant neoplasm of colon: Secondary | ICD-10-CM | POA: Diagnosis not present

## 2016-06-07 NOTE — Patient Outreach (Signed)
[facility]) Care Management  06/07/2016  Donna [name] 1940/07/09 [id number]   Donna [name] is an 76 y.o. female who has had 2 hospital admissions in the last 6 months. Both times she was admitted with CHF. Donna [name] has a history of ventricular tachycardia s/p AICD/BiV pacemaker device placement, diabetes mellitus, type II, and acute respiratory failure with hypoxia.  Donna [name] lives in [place] in her home with an adult grandson. Her sister also lives in the area and has a close and supportiverelationship with Donna [name].   I saw Donna [name] at home last week to follow up on her general condition, her weight and fluid intake/volume, and medication management. Donna [name] was quite fatigued and said she hadn't felt "so run down" in as long as she could remember. She did not have any overt signs of volume overload or CHF exacerbation. She denied any symptoms of bleeding.   Donna [name] had recently seen Dr. Luan Pulling in the office for management/treatment of anemia. I reached out to Dr.Hawkins' office to request a copy of the office note and to inquire about her next scheduled appointment. Donna [name] is next scheduled to see Dr. Luan Pulling on 06/28/16. He offered to get her into the office earlier if needed.    Today, I called Donna [name] at home to follow up and she how she felt over the weekend. She says she is "so weak and so sick". She describes malaise/fatigue, poor appetite, and generally feeling quite unwell. She denies signs or symptoms of CHF, denies subjective fever, chest pain, shortness of breath, swelling, or lightheadedness. I sent a message to [facility] at Dr. Luan Pulling' office relaying the message that Donna [name] feels she needs to see him this week if possible.   Plan: I will reach out to Donna [name] again phone and will plan to see her in person over the next few weeks.   Donna [name] will see [name] NP @ [facility] Cardiology on  06/27/16.   [place] Management  [phone]

## 2016-06-08 ENCOUNTER — Encounter (HOSPITAL_COMMUNITY): Payer: Self-pay

## 2016-06-08 ENCOUNTER — Other Ambulatory Visit: Payer: Self-pay | Admitting: *Deleted

## 2016-06-08 ENCOUNTER — Emergency Department (HOSPITAL_COMMUNITY)
Admission: EM | Admit: 2016-06-08 | Discharge: 2016-06-08 | Disposition: A | Discharge status: Home / Self Care | Payer: Medicare HMO | Attending: Emergency Medicine | Admitting: Emergency Medicine

## 2016-06-08 ENCOUNTER — Emergency Department (HOSPITAL_COMMUNITY): Payer: Medicare HMO

## 2016-06-08 DIAGNOSIS — I110 Hypertensive heart disease with heart failure: Secondary | ICD-10-CM | POA: Diagnosis not present

## 2016-06-08 DIAGNOSIS — Z794 Long term (current) use of insulin: Secondary | ICD-10-CM | POA: Insufficient documentation

## 2016-06-08 DIAGNOSIS — R05 Cough: Secondary | ICD-10-CM | POA: Diagnosis not present

## 2016-06-08 DIAGNOSIS — I5023 Acute on chronic systolic (congestive) heart failure: Secondary | ICD-10-CM | POA: Diagnosis not present

## 2016-06-08 DIAGNOSIS — E039 Hypothyroidism, unspecified: Secondary | ICD-10-CM | POA: Diagnosis not present

## 2016-06-08 DIAGNOSIS — Z7982 Long term (current) use of aspirin: Secondary | ICD-10-CM | POA: Diagnosis not present

## 2016-06-08 DIAGNOSIS — I6789 Other cerebrovascular disease: Secondary | ICD-10-CM | POA: Diagnosis not present

## 2016-06-08 DIAGNOSIS — E1165 Type 2 diabetes mellitus with hyperglycemia: Secondary | ICD-10-CM | POA: Diagnosis not present

## 2016-06-08 DIAGNOSIS — R42 Dizziness and giddiness: Secondary | ICD-10-CM

## 2016-06-08 DIAGNOSIS — J189 Pneumonia, unspecified organism: Secondary | ICD-10-CM | POA: Insufficient documentation

## 2016-06-08 DIAGNOSIS — Z95810 Presence of automatic (implantable) cardiac defibrillator: Secondary | ICD-10-CM | POA: Diagnosis not present

## 2016-06-08 DIAGNOSIS — Z79899 Other long term (current) drug therapy: Secondary | ICD-10-CM | POA: Insufficient documentation

## 2016-06-08 LAB — COMPREHENSIVE METABOLIC PANEL
ALT: 32 U/L (ref 14–54)
AST: 22 U/L (ref 15–41)
Albumin: 3.8 g/dL (ref 3.5–5.0)
Alkaline Phosphatase: 49 U/L (ref 38–126)
Anion gap: 9 (ref 5–15)
BUN: 15 mg/dL (ref 6–20)
CALCIUM: 9.1 mg/dL (ref 8.9–10.3)
CHLORIDE: 100 mmol/L — AB (ref 101–111)
CO2: 29 mmol/L (ref 22–32)
CREATININE: 0.81 mg/dL (ref 0.44–1.00)
Glucose, Bld: 190 mg/dL — ABNORMAL HIGH (ref 65–99)
Potassium: 4.6 mmol/L (ref 3.5–5.1)
Sodium: 138 mmol/L (ref 135–145)
Total Bilirubin: 0.7 mg/dL (ref 0.3–1.2)
Total Protein: 6.7 g/dL (ref 6.5–8.1)

## 2016-06-08 LAB — CBC WITH DIFFERENTIAL/PLATELET
Basophils Absolute: 0 10*3/uL (ref 0.0–0.1)
Basophils Relative: 0 %
EOS PCT: 1 %
Eosinophils Absolute: 0.1 10*3/uL (ref 0.0–0.7)
HCT: 33.8 % — ABNORMAL LOW (ref 36.0–46.0)
Hemoglobin: 10.2 g/dL — ABNORMAL LOW (ref 12.0–15.0)
LYMPHS ABS: 1.1 10*3/uL (ref 0.7–4.0)
Lymphocytes Relative: 15 %
MCH: 27.6 pg (ref 26.0–34.0)
MCHC: 30.2 g/dL (ref 30.0–36.0)
MCV: 91.4 fL (ref 78.0–100.0)
MONO ABS: 0.5 10*3/uL (ref 0.1–1.0)
MONOS PCT: 7 %
Neutro Abs: 5.7 10*3/uL (ref 1.7–7.7)
Neutrophils Relative %: 77 %
PLATELETS: 227 10*3/uL (ref 150–400)
RBC: 3.7 MIL/uL — AB (ref 3.87–5.11)
RDW: 18.7 % — ABNORMAL HIGH (ref 11.5–15.5)
WBC: 7.4 10*3/uL (ref 4.0–10.5)

## 2016-06-08 LAB — URINALYSIS, ROUTINE W REFLEX MICROSCOPIC
Bilirubin Urine: NEGATIVE
GLUCOSE, UA: NEGATIVE mg/dL
HGB URINE DIPSTICK: NEGATIVE
Ketones, ur: NEGATIVE mg/dL
Leukocytes, UA: NEGATIVE
Nitrite: NEGATIVE
PH: 5 (ref 5.0–8.0)
Protein, ur: NEGATIVE mg/dL
SPECIFIC GRAVITY, URINE: 1.005 (ref 1.005–1.030)

## 2016-06-08 LAB — TROPONIN I: TROPONIN I: 0.03 ng/mL — AB (ref <0.03)

## 2016-06-08 LAB — POC OCCULT BLOOD, ED: FECAL OCCULT BLD: NEGATIVE

## 2016-06-08 MED ORDER — SODIUM CHLORIDE 0.9 % IV BOLUS (SEPSIS)
1000.0000 mL | Freq: Once | INTRAVENOUS | Status: AC
  Administered 2016-06-08: 1000 mL via INTRAVENOUS

## 2016-06-08 MED ORDER — AZITHROMYCIN 250 MG PO TABS: 250.0000 mg | ORAL_TABLET | Freq: Every day | ORAL | 0 refills | Status: DC

## 2016-06-08 MED ORDER — AZITHROMYCIN 250 MG PO TABS
500.0000 mg | ORAL_TABLET | Freq: Once | ORAL | Status: AC
  Administered 2016-06-08: 500 mg via ORAL
  Filled 2016-06-08: qty 2

## 2016-06-08 NOTE — Discharge Instructions (Signed)
Stop taking spironolactone. Take the antibiotic Azithomycin starting tomorrow. An appointment has been scheduled for you at [place] office for Friday, 06/10/2016 at 9 AM. Return if concern for any reason

## 2016-06-08 NOTE — ED Triage Notes (Signed)
Pt reports dizziness for over a week, memory loss since November. CBG 240.

## 2016-06-08 NOTE — ED Notes (Signed)
CRITICAL VALUE ALERT  Critical value received:  Troponin 0.03  Date of notification:  06/08/16  Time of notification:  [id number]  Critical value read back:YES  Nurse who received alert:  Leeanne Deed RN   MD notified (1st page):Dr Winfred Leeds  Time of first page:  1235  MD notified (2nd page):  Time of second page:  Responding MD:  Dr Winfred Leeds  Time MD responded:  1235

## 2016-06-08 NOTE — Patient Outreach (Signed)
[facility]) Care Management  06/08/2016  CYERRA LEHNE 1941-02-10 [id number]  Call received from Theodis Shove, sister of Mrs. Belling who reported that Mrs. Scalisi is feeling worse today and wanted to speak with me. I called Mrs. Gundry who stated she was weak and dizzy today. Notably, she is somewhat confused today which is inconsistent with her baseline. Her sister reports that she has been confused about her medications and has not been eating for the last few days. Mrs. Renaldo agreed to go to the hospital. She is home alone but able to open the front door. I called EMS for emergency transport to the hospital. Mrs. Kuhl is in agreement. I notified Dr. Sinda Du.   Plan: I will follow up on Mrs. Cranor's progress via medical record and telephone outreach.    [place] Management  [phone]

## 2016-06-08 NOTE — ED Notes (Signed)
Attempted IV in R forearm prior to [facility] but was unsuccessful.

## 2016-06-08 NOTE — ED Provider Notes (Signed)
[facility] DEPT Provider Note   CSN: [id number] Arrival date & time: 06/08/16  1118     History   Chief Complaint Chief Complaint  Patient presents with  . Dizziness    History is obtained from patient's daughter, patient and patient's adult grandson who lives with her HPI CHIZITEREM KITTNER is a 76 y.o. female.  HPI Complains of generalized dizziness meaning weakness for the past 2 months and becoming more forgetful since mid November 2017. She's also had a cough since mid November 2017 which has improved over the past week per her grandson. Patient denies shortness of breath. She was seen by Dr. Luan Pulling, told that she was anemic told to start iron which she's been taking 1 tablet daily. Her daughter also reports that she was mixing up her medicines and taking them improperly however , her daughter reports that her medicines and organize that she's not confident that she's taking them properly. Patient denies pain anywhere. No falls. No focal numbness or weakness. No other associated symptoms. Nothing makes symptoms better or worse. No other associated symptoms Past Medical History:  Diagnosis Date  . AICD (automatic cardioverter/defibrillator) present   . Arthritis    "fingers" (11/18/2014)  . Atrial flutter (HCC)    Versus ventricular tachycardia; treated with amiodarone  . Cardiomyopathy, nonischemic ([place])    Nl cors in [place]; CHF in 12/97; EF of 40% in 5/01 and 7/03, 25% in 9/04 and 4/08.  systolic murmur without valvular abnormalities by echo; refused automatic implantable cardiac defibrillator  . Cerebrovascular disease    Duplex study in 12/2006 shows atherosclerosis without focal stenosis  . CHF (congestive heart failure) ([place])   . Diabetes mellitus, type II ([place])    No insulin  . Gastroesophageal reflux disease   . Hyperlipidemia   . Hypertension   . Left bundle branch block   . Ventricular tachycardia ([place])    a. PMH it lists "atrial flutter versus ventricular  tachycardia treated with amiodarone" - details of this are unclear. Notes from back to 2006 indicate she has been maintained on amiodarone but do not describe further indication. Patient's sister reports pt was told she had skipped beats that could cause her to drop dead - Dr. Lattie Haw put her on amiodarone and recommended a defibrillator.    Patient Active Problem List   Diagnosis Date Noted  . Nausea 03/09/2016  . Dizziness 02/03/2016  . Fall on flat surface, tripped on rug; no injury 01/19/2016    Class: Acute  . Acute on chronic systolic CHF (congestive heart failure) ([place]) 12/29/2015  . Hypokalemia 05/18/2015  . Acute on chronic systolic congestive heart failure ([place]) 05/16/2015  . Acute on chronic systolic (congestive) heart failure 05/16/2015  . Chronic systolic heart failure ([place]) 11/18/2014  . NICM (nonischemic cardiomyopathy) ([place]) 11/18/2014  . Acute on chronic systolic heart failure ([place]) 10/20/2014  . Acute respiratory failure with hypoxia ([place]) 10/18/2014  . CHF exacerbation ([place]) 07/22/2014  . Acute exacerbation of CHF (congestive heart failure) ([place]) 07/22/2014  . Abnormality of gait 09/26/2013  . Weakness of left hip 09/26/2013  . Diabetes mellitus, type II ([place]) 06/15/2011  . Cardiomyopathy, nonischemic ([place])   . Hypertension   . Hypothyroidism 05/28/2009  . Left bundle branch block 05/28/2009  . GASTROESOPHAGEAL REFLUX DISEASE 05/28/2009  . Hyperlipidemia 05/14/2008  . VENTRICULAR TACHYCARDIA 05/14/2008  . CEREBROVASCULAR DISEASE 05/14/2008    Past Surgical History:  Procedure Laterality Date  . ABDOMINAL HYSTERECTOMY  2006  . BI-VENTRICULAR IMPLANTABLE  CARDIOVERTER DEFIBRILLATOR  (CRT-D)  11/18/2014  . COLONOSCOPY  2008  . EP IMPLANTABLE DEVICE N/A 11/18/2014   Procedure: BiV ICD Insertion CRT-D;  Surgeon: Evans Lance, MD;  Location: [facility];  Service: Cardiovascular;  Laterality: N/A;  . HERNIA REPAIR Right   . TONSILLECTOMY      OB History     Gravida Para Term Preterm AB Living             2   SAB TAB Ectopic Multiple Live Births                   Home Medications    Prior to Admission medications   Medication Sig Start Date End Date Taking? Authorizing Provider  acetaminophen (TYLENOL) 500 MG tablet Take 500 mg by mouth every 6 (six) hours as needed.    Historical Provider, MD  aspirin 81 MG tablet Take 81 mg by mouth daily.    Historical Provider, MD  atorvastatin (LIPITOR) 20 MG tablet Take 1 tablet (20 mg total) by mouth daily at 6 PM. 07/24/14   Sinda Du, MD  calcium gluconate 500 MG tablet Take 500-1,000 mg by mouth 2 (two) times daily. 2 tablets in the morning and 1 tablet at night.    Historical Provider, MD  carvedilol (COREG) 3.125 MG tablet Take 1 tablet (3.125 mg total) by mouth 2 (two) times daily. 05/17/16   Evans Lance, MD  Cholecalciferol (VITAMIN D PO) Take 600 mg by mouth 2 (two) times daily.    Historical Provider, MD  fish oil-omega-3 fatty acids 1000 MG capsule Take 1 capsule by mouth 3 (three) times daily.      Historical Provider, MD  insulin detemir (LEVEMIR) 100 UNIT/ML injection Inject 12 Units into the skin at bedtime.    Sinda Du, MD  levothyroxine (SYNTHROID, LEVOTHROID) 75 MCG tablet Take 75 mcg by mouth daily before breakfast.    Historical Provider, MD  linagliptin (TRADJENTA) 5 MG TABS tablet Take 5 mg by mouth daily.    Historical Provider, MD  losartan (COZAAR) 25 MG tablet Take 0.5 tablets (12.5 mg total) by mouth daily. Patient taking differently: Take 25 mg by mouth daily.  02/23/16 05/23/16  Arnoldo Lenis, MD  omeprazole (PRILOSEC) 20 MG capsule Take 20 mg by mouth 2 (two) times daily.      Historical Provider, MD  potassium chloride (KLOR-CON) 20 MEQ packet Take 20 mEq by mouth 2 (two) times daily. 03/18/16   Imogene Burn, PA-C  spironolactone (ALDACTONE) 25 MG tablet Take 0.5 tablets (12.5 mg total) by mouth daily. 05/26/16   Lendon Colonel, NP  torsemide  (DEMADEX) 20 MG tablet Take 1 tablet (20 mg total) by mouth daily. 05/12/16   Arnoldo Lenis, MD  UNIFINE PENTIPS 32G X 4 MM MISC USE TWICE A DAY TO TAKE INSULIN. 01/06/16   Cassandria Anger, MD    Family History Family History  Problem Relation Age of Onset  . Diabetes Mother   . Kidney disease Mother   . Diabetes Sister   . Heart attack Brother     Social History Social History  Substance Use Topics  . Smoking status: Never Smoker  . Smokeless tobacco: Never Used  . Alcohol use No     Allergies   Codeine and Decongestant [pseudoephedrine hcl er]   Review of Systems Review of Systems  Respiratory: Positive for cough.   Neurological: Positive for light-headedness.     Physical Exam  Updated Vital Signs BP 117/62 (BP Location: Left Arm)   Pulse 65   Temp 98.1 F (36.7 C) (Oral)   Resp 18   Ht 5\' 1"  (1.549 m)   Wt 119 lb (54 kg)   SpO2 99%   BMI 22.48 kg/m   Physical Exam  Constitutional: She is oriented to person, place, and time. She appears well-developed and well-nourished.  HENT:  Head: Normocephalic and atraumatic.  Eyes: Conjunctivae are normal. Pupils are equal, round, and reactive to light.  Neck: Neck supple. No tracheal deviation present. No thyromegaly present.  Cardiovascular: Normal rate and regular rhythm.   No murmur heard. Pulmonary/Chest: Effort normal and breath sounds normal.  Abdominal: Soft. Bowel sounds are normal. She exhibits no distension. There is no tenderness.  Musculoskeletal: Normal range of motion. She exhibits no edema or tenderness.  Neurological: She is alert and oriented to person, place, and time. Coordination normal.  Gait normal  Skin: Skin is warm and dry. No rash noted.  Psychiatric: She has a normal mood and affect.  Nursing note and vitals reviewed.    ED Treatments / Results  Labs (all labs ordered are listed, but only abnormal results are displayed) Labs Reviewed  URINALYSIS, ROUTINE W REFLEX  MICROSCOPIC  CBC WITH DIFFERENTIAL/PLATELET  COMPREHENSIVE METABOLIC PANEL  TROPONIN I    EKG  EKG Interpretation  Date/Time:  Wednesday June 08 2016 11:23:02 EST Ventricular Rate:  72 PR Interval:    QRS Duration: 138 QT Interval:  488 QTC Calculation: 535 R Axis:   -73 Text Interpretation:  Atrial-sensed ventricular-paced rhythm No further analysis attempted due to paced rhythm Baseline wander in lead(s) V6 No significant change since last tracing Confirmed by Winfred Leeds  MD, Nino Amano [phone]) on 06/08/2016 12:00:25 PM      Results for orders placed or performed during the hospital encounter of 06/08/16  Urinalysis, Routine w reflex microscopic  Result Value Ref Range   Color, Urine STRAW (A) YELLOW   APPearance CLEAR CLEAR   Specific Gravity, Urine 1.005 1.005 - 1.030   pH 5.0 5.0 - 8.0   Glucose, UA NEGATIVE NEGATIVE mg/dL   Hgb urine dipstick NEGATIVE NEGATIVE   Bilirubin Urine NEGATIVE NEGATIVE   Ketones, ur NEGATIVE NEGATIVE mg/dL   Protein, ur NEGATIVE NEGATIVE mg/dL   Nitrite NEGATIVE NEGATIVE   Leukocytes, UA NEGATIVE NEGATIVE  CBC with Differential/Platelet  Result Value Ref Range   WBC 7.4 4.0 - 10.5 K/uL   RBC 3.70 (L) 3.87 - 5.11 MIL/uL   Hemoglobin 10.2 (L) 12.0 - 15.0 g/dL   HCT 33.8 (L) 36.0 - 46.0 %   MCV 91.4 78.0 - 100.0 fL   MCH 27.6 26.0 - 34.0 pg   MCHC 30.2 30.0 - 36.0 g/dL   RDW 18.7 (H) 11.5 - 15.5 %   Platelets 227 150 - 400 K/uL   Neutrophils Relative % 77 %   Neutro Abs 5.7 1.7 - 7.7 K/uL   Lymphocytes Relative 15 %   Lymphs Abs 1.1 0.7 - 4.0 K/uL   Monocytes Relative 7 %   Monocytes Absolute 0.5 0.1 - 1.0 K/uL   Eosinophils Relative 1 %   Eosinophils Absolute 0.1 0.0 - 0.7 K/uL   Basophils Relative 0 %   Basophils Absolute 0.0 0.0 - 0.1 K/uL  Comprehensive metabolic panel  Result Value Ref Range   Sodium 138 135 - 145 mmol/L   Potassium 4.6 3.5 - 5.1 mmol/L   Chloride 100 (L) 101 - 111 mmol/L  CO2 29 22 - 32 mmol/L   Glucose, Bld  190 (H) 65 - 99 mg/dL   BUN 15 6 - 20 mg/dL   Creatinine, Ser 0.81 0.44 - 1.00 mg/dL   Calcium 9.1 8.9 - 10.3 mg/dL   Total Protein 6.7 6.5 - 8.1 g/dL   Albumin 3.8 3.5 - 5.0 g/dL   AST 22 15 - 41 U/L   ALT 32 14 - 54 U/L   Alkaline Phosphatase 49 38 - 126 U/L   Total Bilirubin 0.7 0.3 - 1.2 mg/dL   GFR calc non Af Amer >60 >60 mL/min   GFR calc Af Amer >60 >60 mL/min   Anion gap 9 5 - 15  Troponin I  Result Value Ref Range   Troponin I 0.03 (HH) <0.03 ng/mL  POC occult blood, ED Provider will collect  Result Value Ref Range   Fecal Occult Bld NEGATIVE NEGATIVE   Dg Chest 2 View  Result Date: 06/08/2016 CLINICAL DATA:  Weakness, dizziness and cough. EXAM: CHEST  2 VIEW COMPARISON:  02/26/2016 FINDINGS: The pacer wires are stable. The heart is upper limits of normal in size and stable. Mild tortuosity and calcification of the thoracic aorta. Patchy right middle lobe airspace opacity suspicious for infiltrate. IMPRESSION: Suspect right mid infiltrate. Electronically Signed   By: Marijo Sanes M.D.   On: 06/08/2016 13:30    Radiology No results found. Chest x-ray viewed by me Procedures Procedures (including critical care time)  Medications Ordered in ED Medications - No data to display  1:50 PM patient feels well after treatment with 1 L normal saline intravenously. She looks well. Feel the patient is somewhat dehydrated as she feels lightheadedness worse with standing. I spoke with Dr. Luan Pulling. Plan will stop spironolactone. Treated patient with azithromycin for possible community-acquired pneumonia, although her cough is reportedly improving. An appointment has been scheduled in his office for 06/10/2016 at 9 AM  Troponin is chronically elevated . Initial Impression / Assessment and Plan / ED Course  I have reviewed the triage vital signs and the nursing notes.  Pertinent labs & imaging results that were available during my care of the patient were reviewed by me and considered  in my medical decision making (see chart for details).  Clinical Course       Final Clinical Impressions(s) / ED Diagnoses  Diagnoses #1 generalized weakness  #2 community-acquired pneumonia  #3 hyperglycemia  Final diagnoses:  None    New Prescriptions New Prescriptions   No medications on file     Orlie Dakin, MD 06/08/16 1457

## 2016-06-09 ENCOUNTER — Other Ambulatory Visit: Payer: Self-pay | Admitting: *Deleted

## 2016-06-09 NOTE — Patient Outreach (Signed)
[facility]) Care Management  06/09/2016  Donna [name] 16-Sep-1940 [id number]  I reached out to [name] today to follow up on her ED visit. She was unable to come to the phone but I confirmed that she is scheduled to see Dr. Luan Pulling in the office tomorrow.   Plan: I left a message for [name] and will plan to call her next week to follow up on her progress.    [facility] Management  [phone]

## 2016-06-10 DIAGNOSIS — E119 Type 2 diabetes mellitus without complications: Secondary | ICD-10-CM | POA: Diagnosis not present

## 2016-06-10 DIAGNOSIS — D649 Anemia, unspecified: Secondary | ICD-10-CM | POA: Diagnosis not present

## 2016-06-10 DIAGNOSIS — I5022 Chronic systolic (congestive) heart failure: Secondary | ICD-10-CM | POA: Diagnosis not present

## 2016-06-10 DIAGNOSIS — I110 Hypertensive heart disease with heart failure: Secondary | ICD-10-CM | POA: Diagnosis not present

## 2016-06-13 ENCOUNTER — Ambulatory Visit (INDEPENDENT_AMBULATORY_CARE_PROVIDER_SITE_OTHER): Payer: Self-pay | Admitting: *Deleted

## 2016-06-13 DIAGNOSIS — I5022 Chronic systolic (congestive) heart failure: Secondary | ICD-10-CM

## 2016-06-13 DIAGNOSIS — I428 Other cardiomyopathies: Secondary | ICD-10-CM

## 2016-06-13 NOTE — Progress Notes (Signed)
Remote ICD transmission.   

## 2016-06-15 ENCOUNTER — Encounter: Payer: Self-pay | Admitting: Cardiology

## 2016-06-15 ENCOUNTER — Other Ambulatory Visit: Payer: Self-pay | Admitting: *Deleted

## 2016-06-15 NOTE — Patient Outreach (Signed)
[facility]) Care Management  06/15/2016  MACAYLAH BATER 1940-11-09 [id number]  Unable to reach Mrs. Deming by phone when I called today to follow up on her CHF management and anemia. I left a HIPPA compliant voice message.   Plan: I will try to reach Mrs. Nogueira by phone again on Friday.    [facility] Management  [phone]

## 2016-06-17 ENCOUNTER — Other Ambulatory Visit: Payer: Self-pay | Admitting: *Deleted

## 2016-06-17 NOTE — Patient Outreach (Signed)
[facility]) Care Management  06/17/2016  Donna [name] 1940/09/30 [id number]   Donna Gombert Benfieldis an 76 y.o.femalewho has had 2 hospital admissions in the last 6 months. Both times she was admitted with CHF. Donna [name] has a history of ventricular tachycardia s/p AICD/BiV pacemaker device placement, diabetes mellitus, type II, and acute respiratory failure with hypoxia.  Donna [name] lives in [place] in her home with an adult grandson. Her sister also lives in the area and has a close and supportiverelationship with Donna [name].    I saw Donna [name] at home just prior to the [place] and have been talking with her frequently by phone about her profound fatigue, weakness, forgetfulness, and not infrequent dizziness. She is being treated by her primary care doctor for anemia and is taking an iron supplement. She has been having difficulty managing her medications recently as well, which is out of character for her. Her daughter began filling a 1 day supply of medications in her pill box and checking on her twice daily.   Donna [name] reported to the ED on 06/08/16 because of worsening weakness and dizziness, was evaluated, and discharged to home. She has since seen Dr. Luan Pulling in the office. She is scheduled to see him again in less than 2 weeks and also has an appointment with cardiology on 06/27/16.   I called Donna [name] today to follow up on her condition. Her daughter was at home with and was prompting her as to how to answer questions I was asking. DonnaCarson seemed to be oriented but clearly was having more difficulty carrying on a conversation with me than usual. She said she remains weak and tired and feels "washed out". She denied any acute symptoms such as shortness of breath, chest pain, swelling, or edema.   Plan: I will forward this assessment note to Dr. Luan Pulling and will follow up with Donna [name] next week.    [facility] Management  [phone]

## 2016-06-22 ENCOUNTER — Other Ambulatory Visit: Payer: Self-pay | Admitting: *Deleted

## 2016-06-22 NOTE — Patient Outreach (Signed)
[facility]) Care Management  06/22/2016  LIBRADA BOTT 1941-04-15 [id number]  Mrs. Lepera did not answer when I called today to follow up on her recent complaints of weakness/anemia and CHF disease management. I was able to leave a HIPPA compliant voice message requesting a return call.   Plan: I will reach out to Mrs. Ashe by phone again next week.    [facility] Management  [phone]

## 2016-06-23 ENCOUNTER — Other Ambulatory Visit: Payer: Self-pay | Admitting: *Deleted

## 2016-06-23 NOTE — Patient Outreach (Signed)
[facility]) Care Management  06/23/2016  Donna [name] 01/11/1941 [id number]  Call received from Donna [name] this morning. She immediately told me that she couldn't remember why she called and then asked me about why her skin was dry. She shared a story with me 4 times in succession, as if the event just happened. This is a story about a family's member's illness that happened about 6 months ago. She then remembered that she called me to ask if I knew if she had a prescription ready for a "nerve pill". I called [facility] and the pharmacist informed me that Donna [name] has a prescription ready for Alprazolam. I returned a call to Donna [name] to notify her and she said her grand son will pick it up for her. She went on to say that she only needed it to help her "be able to relax at night so I can go to sleep."   I have followed Donna [name] off and on in the community for > 2 years. Over the last 3-4 months, her cognitive status is significantly changed. She is forgetful and often confused. She has had difficulty with medication management which is very much out of character for her. She has always been knowledgeable and meticulous about her medications, to the degree that she has been able until recently to tell me without looking at a pill bottle, the exact date of her last refill. Today, she is reporting having more and more insomnia but not feeling tired or sleepy during the day.   Plan: I will update Dr. Luan Pulling about my conversation with Donna [name]. Donna [name] will see Dr. Luan Pulling and Estella Husk PA-C [facility], [place]) in the office on Monday.   I will follow up with Donna [name] next week after she has seen her doctors and to review any changes/instructions.    [place] Management  [phone]

## 2016-06-27 ENCOUNTER — Ambulatory Visit: Payer: Self-pay | Admitting: Adult Health

## 2016-06-27 ENCOUNTER — Encounter: Payer: Self-pay | Admitting: Physician Assistant

## 2016-06-27 ENCOUNTER — Ambulatory Visit (INDEPENDENT_AMBULATORY_CARE_PROVIDER_SITE_OTHER): Payer: Medicare HMO | Admitting: Physician Assistant

## 2016-06-27 VITALS — BP 100/60 | HR 83 | Ht <= 58 in | Wt 118.0 lb

## 2016-06-27 DIAGNOSIS — R42 Dizziness and giddiness: Secondary | ICD-10-CM | POA: Diagnosis not present

## 2016-06-27 DIAGNOSIS — R410 Disorientation, unspecified: Secondary | ICD-10-CM

## 2016-06-27 DIAGNOSIS — I428 Other cardiomyopathies: Secondary | ICD-10-CM

## 2016-06-27 DIAGNOSIS — I4729 Other ventricular tachycardia: Secondary | ICD-10-CM

## 2016-06-27 DIAGNOSIS — I472 Ventricular tachycardia: Secondary | ICD-10-CM | POA: Diagnosis not present

## 2016-06-27 NOTE — Progress Notes (Addendum)
Cardiology Office Note    Date:  06/27/2016   ID:  Donna [name], Donna [name] 08-13-1940, MRN [id number]  PCP:  Alonza Bogus, MD  Cardiologist: Dr. Harl Bowie EPS: Dr. Lovena Le  Chief Complaint  Patient presents with  . Follow-up    History of Present Illness:  Donna [name] is a 76 y.o. female with history of nonischemic cardiomyopathy prior cath without obstructive disease. LVEF 20% by echo in 2013, 15-20% on echo 10/2014. She has a biV AICD followed by Dr. Lovena Le. She saw Dr. Harl Bowie 05/12/16 complaining of chronic fatigue and dizziness. He he was down titrating her CHF meds to see if symptoms improved. Also has history of ventricular tachycardia but no recent palpitations. Torsemide was decreased to once daily and she was asked to increase her fluid intake to 6 cups daily.  She saw [name] 05/26/16 at which time she was very confused about her medications was taking higher doses of spironolactone and losartan and then she should've been taken. These were adjusted. Patient ended up in the emergency room 06/08/16 with recurrent dizziness treated with IV fluids. Spironolactone was stopped. She was also started on antibiotics for possible pneumonia.  We received message note from [facility] saying that she is very confused over her medications.  Patient comes in today accompanied by her sister. She did not bring her medications and can't remember what she is taking. She says she feels terrible in general. She says she is a low dizzy when she first gets up but is better than it had been. She saw Dr. Luan Pulling on 06/20/16 and we are waiting on that note. She is scheduled to see him tomorrow as well.     Past Medical History:  Diagnosis Date  . AICD (automatic cardioverter/defibrillator) present   . Arthritis    "fingers" (11/18/2014)  . Atrial flutter (HCC)    Versus ventricular tachycardia; treated with amiodarone  . Cardiomyopathy, nonischemic ([place])    Nl cors in [place]; CHF in 12/97;  EF of 40% in 5/01 and 7/03, 25% in 9/04 and 4/08.  systolic murmur without valvular abnormalities by echo; refused automatic implantable cardiac defibrillator  . Cerebrovascular disease    Duplex study in 12/2006 shows atherosclerosis without focal stenosis  . CHF (congestive heart failure) ([place])   . Diabetes mellitus, type II ([place])    No insulin  . Gastroesophageal reflux disease   . Hyperlipidemia   . Hypertension   . Left bundle branch block   . Ventricular tachycardia ([place])    a. PMH it lists "atrial flutter versus ventricular tachycardia treated with amiodarone" - details of this are unclear. Notes from back to 2006 indicate she has been maintained on amiodarone but do not describe further indication. Patient's sister reports pt was told she had skipped beats that could cause her to drop dead - Dr. Lattie Haw put her on amiodarone and recommended a defibrillator.    Past Surgical History:  Procedure Laterality Date  . ABDOMINAL HYSTERECTOMY  2006  . BI-VENTRICULAR IMPLANTABLE CARDIOVERTER DEFIBRILLATOR  (CRT-D)  11/18/2014  . COLONOSCOPY  2008  . EP IMPLANTABLE DEVICE N/A 11/18/2014   Procedure: BiV ICD Insertion CRT-D;  Surgeon: Evans Lance, MD;  Location: [facility];  Service: Cardiovascular;  Laterality: N/A;  . HERNIA REPAIR Right   . TONSILLECTOMY      Current Medications: Outpatient Medications Prior to Visit  Medication Sig Dispense Refill  . acetaminophen (TYLENOL) 500 MG tablet Take 500 mg by mouth  every 6 (six) hours as needed.    Marland Kitchen amiodarone (PACERONE) 200 MG tablet Take 100 mg by mouth daily at 12 noon.    Marland Kitchen aspirin 81 MG tablet Take 81 mg by mouth daily.    Marland Kitchen atorvastatin (LIPITOR) 20 MG tablet Take 1 tablet (20 mg total) by mouth daily at 6 PM. 30 tablet 12  . azithromycin (ZITHROMAX) 250 MG tablet Take 1 tablet (250 mg total) by mouth daily. Take 1 tablet daily starting 06/09/2016 4 tablet 0  . calcium gluconate 500 MG tablet Take 500-1,000 mg by mouth 2  (two) times daily. 2 tablets in the morning and 1 tablet at night.    . carvedilol (COREG) 3.125 MG tablet Take 1 tablet (3.125 mg total) by mouth 2 (two) times daily. 60 tablet 11  . Cholecalciferol (VITAMIN D PO) Take 600 mg by mouth daily.     Marland Kitchen dextromethorphan-guaiFENesin (ROBITUSSIN TO GO CGH/CHEST DM) 10-100 MG/5ML liquid Take 10 mLs by mouth every 4 (four) hours as needed for cough.    . fish oil-omega-3 fatty acids 1000 MG capsule Take 1 capsule by mouth 3 (three) times daily.      . insulin detemir (LEVEMIR) 100 UNIT/ML injection Inject 12 Units into the skin at bedtime.    . IRON PO Take 1 tablet by mouth daily.    Marland Kitchen levothyroxine (SYNTHROID, LEVOTHROID) 75 MCG tablet Take 75 mcg by mouth daily before breakfast.    . omeprazole (PRILOSEC) 20 MG capsule Take 20 mg by mouth 2 (two) times daily.      . potassium chloride (KLOR-CON) 20 MEQ packet Take 20 mEq by mouth 2 (two) times daily. (Patient taking differently: Take 20-40 mEq by mouth 2 (two) times daily. Take 40 meq in the morning and 20 meq in the evening.) 60 tablet 1  . sitaGLIPtin (JANUVIA) 100 MG tablet Take 100 mg by mouth daily.    Marland Kitchen spironolactone (ALDACTONE) 25 MG tablet Take 0.5 tablets (12.5 mg total) by mouth daily. 45 tablet 3  . torsemide (DEMADEX) 20 MG tablet Take 1 tablet (20 mg total) by mouth daily. 180 tablet 3  . UNIFINE PENTIPS 32G X 4 MM MISC USE TWICE A DAY TO TAKE INSULIN. 100 each 2  . losartan (COZAAR) 25 MG tablet Take 0.5 tablets (12.5 mg total) by mouth daily. 90 tablet 3   No facility-administered medications prior to visit.      Allergies:   Codeine and Decongestant [pseudoephedrine hcl er]   Social History   Social History  . Marital status: Widowed    Spouse name: N/A  . Number of children: N/A  . Years of education: N/A   Occupational History  . Retired Retired   Social History Main Topics  . Smoking status: Never Smoker  . Smokeless tobacco: Never Used  . Alcohol use No  . Drug use:  No  . Sexual activity: No   Other Topics Concern  . None   Social History Narrative   Lives in [place] with son and grandson   Physically active     Family History:  The patient's   family history includes Diabetes in her mother and sister; Heart attack in her brother; Kidney disease in her mother.   ROS:   Please see the history of present illness.    Review of Systems  Constitution: Positive for weakness and malaise/fatigue.  HENT: Negative.   Eyes: Negative.   Cardiovascular: Negative.   Respiratory: Negative.   Hematologic/Lymphatic: Negative.  Musculoskeletal: Negative.  Negative for joint pain.  Gastrointestinal: Negative.   Genitourinary: Negative.   Neurological: Positive for dizziness.  Psychiatric/Behavioral: Positive for memory loss.   All other systems reviewed and are negative.   PHYSICAL EXAM:   VS:  BP 100/60   Pulse 83   Ht 4\' 10"  (1.473 m)   Wt 118 lb (53.5 kg)   SpO2 90%   BMI 24.66 kg/m   Physical Exam  GEN: Well nourished, well developed, in no acute distress  Neck: no JVD, carotid bruits, or masses Cardiac:RRR; no murmurs, rubs, or gallops  Respiratory:  clear to auscultation bilaterally, normal work of breathing GI: soft, nontender, nondistended, + BS Ext: without cyanosis, clubbing, or edema, Good distal pulses bilaterally MS: no deformity or atrophy  Skin: warm and dry, no rash Neuro:  Alert and Oriented x 3, Strength and sensation are intact, short-term memory is terrible Psych: euthymic mood, full affect  Wt Readings from Last 3 Encounters:  06/27/16 118 lb (53.5 kg)  06/08/16 119 lb (54 kg)  05/26/16 120 lb (54.4 kg)      Studies/Labs Reviewed:   EKG:  EKG is not ordered today.    Recent Labs: 02/24/2016: TSH 4.800 02/26/2016: B Natriuretic Peptide 785.0 03/09/2016: Magnesium 2.3 06/08/2016: ALT 32; BUN 15; Creatinine, Ser 0.81; Hemoglobin 10.2; Platelets 227; Potassium 4.6; Sodium 138   Lipid Panel    Component Value  Date/Time   CHOL 113 02/24/2016 0923   TRIG 83 02/24/2016 0923   HDL 36 (L) 02/24/2016 0923   CHOLHDL 5.1 05/09/2008 0332   VLDL 42 (H) 05/09/2008 0332   LDLCALC 60 02/24/2016 0923    Additional studies/ records that were reviewed today include:  06/2011 Echo   LVEF 20%, mildly dilated, mild LVH, grade I diastolic dysfunction, mild to mod MR, PASP 36    10/04/13 Echo   Study Conclusions  - Left ventricle: Left ventricular systolic function is severely reduced, EF 15-20%. Severe global hypokinesis to akinesis is seen. The cavity size was moderately to severely dilated. Wall thickness was increased in a pattern of mild LVH. Doppler parameters are consistent with abnormal left ventricular relaxation (grade 1 diastolic dysfunction). Doppler parameters are consistent with high ventricular filling pressure. - Regional wall motion abnormality: Akinesis of the mid anterior, mid anteroseptal, basal-mid inferoseptal, apical septal, and apical myocardium; severe hypokinesis of the mid inferior and apical lateral myocardium; moderate hypokinesis of the mid inferolateral and basal-mid anterolateral myocardium. - Aortic valve: Mildly calcified annulus. Mildly thickened leaflets. - Mitral valve: Mildly dilated annulus. Mildly thickened leaflets . Mild to moderate regurgitation. - Left atrium: The atrium was moderately dilated. - Tricuspid valve: Mild regurgitation. - Pulmonary arteries: PA peak pressure: 55mm Hg (S). Mildly elevated pulmonary pressures. - Systemic veins: IVC is dilated, with normal respiratory variation. Estimated CVP 8 mmHg. - Pericardium, extracardiac: A trivial pericardial effusion was identified posterior to the heart.   10/2014 Echo Study Conclusions   - Left ventricle: There is global hypokinesis with akinesis of the   anterior, inferior walls and paradoxical septal motion. Overall   LVEF is severely refused estimated at 15-20%. The cavity size was   mildly  dilated. There was moderate concentric hypertrophy.   Systolic function was severely reduced. The estimated ejection   fraction was in the range of 15-20%. Features are consistent with   a pseudonormal left ventricular filling pattern, with concomitant   abnormal relaxation and increased filling pressure (grade 2   diastolic dysfunction). Doppler parameters are  consistent with   elevated ventricular end-diastolic filling pressure. - Ventricular septum: Septal motion showed paradox. - Aortic valve: Trileaflet; normal thickness leaflets. There was no   regurgitation. - Aortic root: The aortic root was normal in size. - Mitral valve: Structurally normal valve. There was moderate   regurgitation. - Left atrium: The atrium was normal in size. - Right ventricle: Systolic function was mildly reduced. - Right atrium: The atrium was normal in size. - Tricuspid valve: There was mild regurgitation. - Pulmonary arteries: Systolic pressure was within the normal   range. PA peak pressure: 34 mm Hg (S). - Inferior vena cava: The vessel was normal in size. - Pericardium, extracardiac: A trivial pericardial effusion was   identified.     Study Conclusions   - Left ventricle: The cavity size was mildly dilated. Wall   thickness was increased in a pattern of mild LVH. Systolic   function was severely reduced. The estimated ejection fraction   was in the range of 20% to 25%. Diffuse hypokinesis. Features are   consistent with a pseudonormal left ventricular filling pattern,   with concomitant abnormal relaxation and increased filling   pressure (grade 2 diastolic dysfunction). Doppler parameters are   consistent with high ventricular filling pressure. - Mitral valve: There was mild regurgitation. - Left atrium: The atrium was moderately to severely dilated. - Right atrium: The atrium was moderately dilated.   -------------------------------------------------------------------     ASSESSMENT:      1. NICM (nonischemic cardiomyopathy) ([place])   2. Dizziness   3. Confusion   4. VENTRICULAR TACHYCARDIA      PLAN:  In order of problems listed above:  Nonischemic cardiomyopathy EF about 20-25%. Currently compensated without evidence of heart failure. Patient is going to bring her medications by tomorrow after she sees Dr. Luan Pulling so we can adjust. Blood pressure is on the low side today. Follow-up with Dr. Harl Bowie in a couple weeks.   Dizziness seems to have improved some. Was in the emergency room and improved with IV fluids. Spironolactone was stopped but this is still on her medication list.  Confusion and memory loss seems to have worsened over the past few months. TAH and has noticed a decline as well. I will let Dr. Luan Pulling handle this to see if she needs referred to neurologist. She still lives alone and may need placement.  History of ventricular tachycardia with AICD and on low-dose amiodarone followed by Dr. Lovena Le    Medication Adjustments/Labs and Tests Ordered: Current medicines are reviewed at length with the patient today.  Concerns regarding medicines are outlined above.  Medication changes, Labs and Tests ordered today are listed in the Patient Instructions below. Patient Instructions  Medication Instructions:  Please bring all medications by our office tomorrow after you go see Dr. Manya Silvas: none  Testing/Procedures: none  Follow-Up: Your physician recommends that you schedule a follow-up appointment in:2 weeks    Any Other Special Instructions Will Be Listed Below (If Applicable).     If you need a refill on your cardiac medications before your next appointment, please call your pharmacy.    Addendum patient's family brought her medication list to the office for review today. We will update it in our system. She is taking losartan 25 mg daily which should only be a half a tablet daily which we will call and change for her. All the other  medications seem to be in order. I discussed this patient today with Dr. Luan Pulling who is  doing a workup for dementia.  Signed, Ermalinda Barrios, PA-C  06/27/2016 1:58 PM    [facility] [address], [place], [place]  [address] Phone: [phone]; Fax: [phone]

## 2016-06-27 NOTE — Patient Instructions (Addendum)
Medication Instructions:  Please bring all medications by our office tomorrow after you go see Dr. Manya Silvas: none  Testing/Procedures: none  Follow-Up: Your physician recommends that you schedule a follow-up appointment in:2 weeks    Any Other Special Instructions Will Be Listed Below (If Applicable).     If you need a refill on your cardiac medications before your next appointment, please call your pharmacy.

## 2016-06-28 DIAGNOSIS — I5022 Chronic systolic (congestive) heart failure: Secondary | ICD-10-CM | POA: Diagnosis not present

## 2016-06-28 DIAGNOSIS — D649 Anemia, unspecified: Secondary | ICD-10-CM | POA: Diagnosis not present

## 2016-06-28 DIAGNOSIS — I499 Cardiac arrhythmia, unspecified: Secondary | ICD-10-CM | POA: Diagnosis not present

## 2016-06-28 DIAGNOSIS — E119 Type 2 diabetes mellitus without complications: Secondary | ICD-10-CM | POA: Diagnosis not present

## 2016-06-28 LAB — CUP PACEART REMOTE DEVICE CHECK
Battery Voltage: 2.98 V
Brady Statistic AP VP Percent: 78 %
Brady Statistic AP VS Percent: 1 %
Brady Statistic AS VP Percent: 21 %
Brady Statistic RA Percent Paced: 78 %
Date Time Interrogation Session: 20180108070015
HighPow Impedance: 52 Ohm
HighPow Impedance: 52 Ohm
Implantable Lead Implant Date: 20160614
Implantable Lead Location: 753859
Implantable Lead Location: 753860
Implantable Lead Model: 7122
Lead Channel Impedance Value: 430 Ohm
Lead Channel Impedance Value: 950 Ohm
Lead Channel Pacing Threshold Amplitude: 0.5 V
Lead Channel Pacing Threshold Amplitude: 1.25 V
Lead Channel Pacing Threshold Pulse Width: 0.5 ms
Lead Channel Sensing Intrinsic Amplitude: 11.7 mV
Lead Channel Setting Pacing Amplitude: 2 V
Lead Channel Setting Pacing Amplitude: 2.25 V
Lead Channel Setting Pacing Pulse Width: 0.5 ms
Lead Channel Setting Pacing Pulse Width: 0.5 ms
MDC IDC LEAD IMPLANT DT: 20160614
MDC IDC LEAD IMPLANT DT: 20160614
MDC IDC LEAD LOCATION: 753858
MDC IDC MSMT BATTERY REMAINING LONGEVITY: 64 mo
MDC IDC MSMT BATTERY REMAINING PERCENTAGE: 78 %
MDC IDC MSMT LEADCHNL LV PACING THRESHOLD PULSEWIDTH: 0.5 ms
MDC IDC MSMT LEADCHNL RA PACING THRESHOLD AMPLITUDE: 0.5 V
MDC IDC MSMT LEADCHNL RA SENSING INTR AMPL: 1 mV
MDC IDC MSMT LEADCHNL RV IMPEDANCE VALUE: 390 Ohm
MDC IDC MSMT LEADCHNL RV PACING THRESHOLD PULSEWIDTH: 0.5 ms
MDC IDC PG IMPLANT DT: 20160614
MDC IDC SET LEADCHNL RV PACING AMPLITUDE: 2.5 V
MDC IDC SET LEADCHNL RV SENSING SENSITIVITY: 0.5 mV
MDC IDC STAT BRADY AS VS PERCENT: 1 %
Pulse Gen Serial Number: 1210376

## 2016-06-29 ENCOUNTER — Ambulatory Visit: Payer: Self-pay | Admitting: *Deleted

## 2016-06-29 ENCOUNTER — Telehealth: Payer: Self-pay | Admitting: *Deleted

## 2016-06-29 MED ORDER — POTASSIUM CHLORIDE CRYS ER 20 MEQ PO TBCR: EXTENDED_RELEASE_TABLET | ORAL | 3 refills | Status: DC

## 2016-06-29 MED ORDER — LOSARTAN POTASSIUM 25 MG PO TABS: 12.5000 mg | ORAL_TABLET | Freq: Every day | ORAL | 3 refills | Status: DC

## 2016-06-29 NOTE — Telephone Encounter (Signed)
Patient med list updated

## 2016-06-30 ENCOUNTER — Other Ambulatory Visit: Payer: Self-pay | Admitting: *Deleted

## 2016-06-30 NOTE — Patient Outreach (Signed)
[facility]) Care Management  06/30/2016  Donna [name] 01-18-1941 [id number]  Telephone outreach to [name] today to follow up on her CHF management and recent symptoms related to anemia. I spoke with [name] by phone today. She says her weight has remained stable between 115lb and 118lb. She denies swelling, edema, or shortness of breath. [name] reports that she recently saw Dr. Luan Pulling who ordered a test that she is to have at the hospital. She awaits a call about scheduling her test. She says she "might feel the least bit better" but is still quite weak and feels these symptoms are related to her anemia.   Plan: If [name] hasn't heard from a scheduler about her diagnostic examination by tomorrow, she will call Dr. Luan Pulling office. I will reach out to [name] next week in follow up.    [facility]  [phone]

## 2016-07-05 ENCOUNTER — Other Ambulatory Visit (HOSPITAL_COMMUNITY): Payer: Self-pay | Admitting: Pulmonary Disease

## 2016-07-05 DIAGNOSIS — R69 Illness, unspecified: Secondary | ICD-10-CM | POA: Diagnosis not present

## 2016-07-05 DIAGNOSIS — R410 Disorientation, unspecified: Secondary | ICD-10-CM

## 2016-07-06 ENCOUNTER — Ambulatory Visit (HOSPITAL_COMMUNITY)
Admission: RE | Admit: 2016-07-06 | Discharge: 2016-07-06 | Disposition: A | Discharge status: Home / Self Care | Payer: Medicare HMO | Source: Ambulatory Visit | Attending: Pulmonary Disease | Admitting: Pulmonary Disease

## 2016-07-06 DIAGNOSIS — R410 Disorientation, unspecified: Secondary | ICD-10-CM | POA: Insufficient documentation

## 2016-07-06 DIAGNOSIS — R9089 Other abnormal findings on diagnostic imaging of central nervous system: Secondary | ICD-10-CM | POA: Diagnosis not present

## 2016-07-19 ENCOUNTER — Ambulatory Visit (INDEPENDENT_AMBULATORY_CARE_PROVIDER_SITE_OTHER): Payer: Medicare HMO | Admitting: Cardiology

## 2016-07-19 ENCOUNTER — Encounter: Payer: Self-pay | Admitting: Cardiology

## 2016-07-19 VITALS — BP 120/64 | HR 79 | Ht 62.0 in | Wt 118.0 lb

## 2016-07-19 DIAGNOSIS — I472 Ventricular tachycardia: Secondary | ICD-10-CM | POA: Diagnosis not present

## 2016-07-19 DIAGNOSIS — I428 Other cardiomyopathies: Secondary | ICD-10-CM

## 2016-07-19 DIAGNOSIS — I4729 Other ventricular tachycardia: Secondary | ICD-10-CM

## 2016-07-19 DIAGNOSIS — I5022 Chronic systolic (congestive) heart failure: Secondary | ICD-10-CM

## 2016-07-19 DIAGNOSIS — R42 Dizziness and giddiness: Secondary | ICD-10-CM

## 2016-07-19 NOTE — Patient Instructions (Signed)
Medication Instructions:  INCREASE TORSEMIDE TO 30 MG FOR NEXT TWO DAYS, THEN GO BACK TO 20 MG DAILY   Labwork: NONE  Testing/Procedures: NONE  Follow-Up: Your physician recommends that you schedule a follow-up appointment in: 6 WEEKS    Any Other Special Instructions Will Be Listed Below (If Applicable).     If you need a refill on your cardiac medications before your next appointment, please call your pharmacy.

## 2016-07-19 NOTE — Progress Notes (Signed)
Clinical Summary Donna [name] is a 76 y.o.female seen today for follow up of the following medical problems  1. NICM  - long history of cardiomyopathy, prior caths without obstructive disease  - LVEF 20% by echo 06/2011,  - echo 10/2014 LVEF 15-20%.  - 02/2016 echo LVEF 20-25%, grade II diastolic dysfunction - she has BiV AICD followed by Dr Lovena Le.    -she has chronic fatigue and dizziness, we have been downtitrating her CHF meds to see if symptoms improve, - aldactone recently stopped. Last visit encouraged to increase her oral intake to 6 glasses a day.  - dizziness comes and goes. Currently is tolerable.    2. Ventricular tachycardia  - followed by EP. She has BiV AICD.  - denies any recent palpitations  3. Confusion - work up per Dr Luan Pulling. - has f/u with neurologist for memory loss  Past Medical History:  Diagnosis Date  . AICD (automatic cardioverter/defibrillator) present   . Arthritis    "fingers" (11/18/2014)  . Atrial flutter (HCC)    Versus ventricular tachycardia; treated with amiodarone  . Cardiomyopathy, nonischemic ([place])    Nl cors in [place]; CHF in 12/97; EF of 40% in 5/01 and 7/03, 25% in 9/04 and 4/08.  systolic murmur without valvular abnormalities by echo; refused automatic implantable cardiac defibrillator  . Cerebrovascular disease    Duplex study in 12/2006 shows atherosclerosis without focal stenosis  . CHF (congestive heart failure) ([place])   . Diabetes mellitus, type II ([place])    No insulin  . Gastroesophageal reflux disease   . Hyperlipidemia   . Hypertension   . Left bundle Vick Filter block   . Ventricular tachycardia ([place])    a. PMH it lists "atrial flutter versus ventricular tachycardia treated with amiodarone" - details of this are unclear. Notes from back to 2006 indicate she has been maintained on amiodarone but do not describe further indication. Patient's sister reports pt was told she had skipped beats that could cause her to  drop dead - Dr. Lattie Haw put her on amiodarone and recommended a defibrillator.     Allergies  Allergen Reactions  . Codeine Other (See Comments)    Reaction:  Racing heart   . Decongestant [Pseudoephedrine Hcl Er] Other (See Comments)    Reaction:  Racing heart      Current Outpatient Prescriptions  Medication Sig Dispense Refill  . acetaminophen (TYLENOL) 500 MG tablet Take 500 mg by mouth every 6 (six) hours as needed.    Marland Kitchen amiodarone (PACERONE) 200 MG tablet Take 100 mg by mouth daily at 12 noon.    Marland Kitchen aspirin 81 MG tablet Take 81 mg by mouth daily.    Marland Kitchen atorvastatin (LIPITOR) 20 MG tablet Take 1 tablet (20 mg total) by mouth daily at 6 PM. 30 tablet 12  . calcium gluconate 500 MG tablet Take 500-1,000 mg by mouth 2 (two) times daily. 2 tablets in the morning and 1 tablet at night.    . carvedilol (COREG) 3.125 MG tablet Take 1 tablet (3.125 mg total) by mouth 2 (two) times daily. 60 tablet 11  . Cholecalciferol (VITAMIN D PO) Take 600 mg by mouth daily.     . fish oil-omega-3 fatty acids 1000 MG capsule Take 1 capsule by mouth 3 (three) times daily.      . insulin detemir (LEVEMIR) 100 UNIT/ML injection Inject 12 Units into the skin at bedtime.    . IRON PO Take 1 tablet by mouth daily.    Marland Kitchen  levothyroxine (SYNTHROID, LEVOTHROID) 75 MCG tablet Take 75 mcg by mouth daily before breakfast.    . losartan (COZAAR) 25 MG tablet Take 0.5 tablets (12.5 mg total) by mouth daily. 90 tablet 3  . omeprazole (PRILOSEC) 20 MG capsule Take 20 mg by mouth 2 (two) times daily.      . potassium chloride SA (KLOR-CON M20) 20 MEQ tablet Take 2 Tablets In The Morning And Take 1 Tablet In The Evening 90 tablet 3  . sitaGLIPtin (JANUVIA) 100 MG tablet Take 100 mg by mouth daily.    Marland Kitchen torsemide (DEMADEX) 20 MG tablet Take 1 tablet (20 mg total) by mouth daily. 180 tablet 3  . UNIFINE PENTIPS 32G X 4 MM MISC USE TWICE A DAY TO TAKE INSULIN. 100 each 2   No current facility-administered medications for  this visit.      Past Surgical History:  Procedure Laterality Date  . ABDOMINAL HYSTERECTOMY  2006  . BI-VENTRICULAR IMPLANTABLE CARDIOVERTER DEFIBRILLATOR  (CRT-D)  11/18/2014  . COLONOSCOPY  2008  . EP IMPLANTABLE DEVICE N/A 11/18/2014   Procedure: BiV ICD Insertion CRT-D;  Surgeon: Evans Lance, MD;  Location: [facility];  Service: Cardiovascular;  Laterality: N/A;  . HERNIA REPAIR Right   . TONSILLECTOMY       Allergies  Allergen Reactions  . Codeine Other (See Comments)    Reaction:  Racing heart   . Decongestant [Pseudoephedrine Hcl Er] Other (See Comments)    Reaction:  Racing heart       Family History  Problem Relation Age of Onset  . Diabetes Mother   . Kidney disease Mother   . Diabetes Sister   . Heart attack Brother      Social History Donna [name] reports that she has never smoked. She has never used smokeless tobacco. Donna [name] reports that she does not drink alcohol.   Review of Systems CONSTITUTIONAL: No weight loss, fever, chills, weakness or fatigue.  HEENT: Eyes: No visual loss, blurred vision, double vision or yellow sclerae.No hearing loss, sneezing, congestion, runny nose or sore throat.  SKIN: No rash or itching.  CARDIOVASCULAR: no chest pain, no palpitations.  RESPIRATORY: No shortness of breath, cough or sputum.  GASTROINTESTINAL: No anorexia, nausea, vomiting or diarrhea. No abdominal pain or blood.  GENITOURINARY: No burning on urination, no polyuria NEUROLOGICAL: No headache, dizziness, syncope, paralysis, ataxia, numbness or tingling in the extremities. No change in bowel or bladder control.  MUSCULOSKELETAL: No muscle, back pain, joint pain or stiffness.  LYMPHATICS: No enlarged nodes. No history of splenectomy.  PSYCHIATRIC: No history of depression or anxiety.  ENDOCRINOLOGIC: No reports of sweating, cold or heat intolerance. No polyuria or polydipsia.  Marland Kitchen   Physical Examination Vitals:   07/19/16 1342  BP: 120/64    Pulse: 79   Vitals:   07/19/16 1342  Weight: 118 lb (53.5 kg)  Height: 5\' 2"  (1.575 m)    Gen: resting comfortably, no acute distress HEENT: no scleral icterus, pupils equal round and reactive, no palptable cervical adenopathy,  CV: RRR, no m/r/g, no jvd  Resp: Clear to auscultation bilaterally GI: abdomen is soft, non-tender, non-distended, normal bowel sounds, no hepatosplenomegaly MSK: extremities are warm, no edema.  Skin: warm, no rash Neuro:  no focal deficits Psych: appropriate affect   Diagnostic Studies 06/2011 Echo LVEF 20%, mildly dilated, mild LVH, grade I diastolic dysfunction, mild to mod MR, PASP 36   10/04/13 Echo Study Conclusions  - Left ventricle: Left ventricular  systolic function is severely reduced, EF 15-20%. Severe global hypokinesis to akinesis is seen. The cavity size was moderately to severely dilated. Wall thickness was increased in a pattern of mild LVH. Doppler parameters are consistent with abnormal left ventricular relaxation (grade 1 diastolic dysfunction). Doppler parameters are consistent with high ventricular filling pressure. - Regional wall motion abnormality: Akinesis of the mid anterior, mid anteroseptal, basal-mid inferoseptal, apical septal, and apical myocardium; severe hypokinesis of the mid inferior and apical lateral myocardium; moderate hypokinesis of the mid inferolateral and basal-mid anterolateral myocardium. - Aortic valve: Mildly calcified annulus. Mildly thickened leaflets. - Mitral valve: Mildly dilated annulus. Mildly thickened leaflets . Mild to moderate regurgitation. - Left atrium: The atrium was moderately dilated. - Tricuspid valve: Mild regurgitation. - Pulmonary arteries: PA peak pressure: 21mm Hg (S). Mildly elevated pulmonary pressures. - Systemic veins: IVC is dilated, with normal respiratory variation. Estimated CVP 8 mmHg. - Pericardium, extracardiac: A trivial pericardial effusion was identified  posterior to the heart.  10/2014 Echo Study Conclusions  - Left ventricle: There is global hypokinesis with akinesis of the anterior, inferior walls and paradoxical septal motion. Overall LVEF is severely refused estimated at 15-20%. The cavity size was mildly dilated. There was moderate concentric hypertrophy. Systolic function was severely reduced. The estimated ejection fraction was in the range of 15-20%. Features are consistent with a pseudonormal left ventricular filling pattern, with concomitant abnormal relaxation and increased filling pressure (grade 2 diastolic dysfunction). Doppler parameters are consistent with elevated ventricular end-diastolic filling pressure. - Ventricular septum: Septal motion showed paradox. - Aortic valve: Trileaflet; normal thickness leaflets. There was no regurgitation. - Aortic root: The aortic root was normal in size. - Mitral valve: Structurally normal valve. There was moderate regurgitation. - Left atrium: The atrium was normal in size. - Right ventricle: Systolic function was mildly reduced. - Right atrium: The atrium was normal in size. - Tricuspid valve: There was mild regurgitation. - Pulmonary arteries: Systolic pressure was within the normal range. PA peak pressure: 34 mm Hg (S). - Inferior vena cava: The vessel was normal in size. - Pericardium, extracardiac: A trivial pericardial effusion was identified.     Assessment and Plan  1. NICM/Chronic systolic HF  - no recent edema or SOB. Ongoing orthostatic dizziness despite significant downtitration of CHF meds.  - orthostatic symptoms currently tolerable, we will continue current meds.  - Historically her fluid status has been very labile. Today with some increased LE edema, she will take torsemide 30mg  daily x 2 days then go back to 20mg  daily.    2. Ventricular tachycardia  - per EP, she has BiV AICD    Arnoldo Lenis, M.D.

## 2016-07-25 ENCOUNTER — Ambulatory Visit (INDEPENDENT_AMBULATORY_CARE_PROVIDER_SITE_OTHER): Payer: Medicare HMO | Admitting: Neurology

## 2016-07-25 ENCOUNTER — Encounter: Payer: Self-pay | Admitting: Neurology

## 2016-07-25 VITALS — BP 123/79 | HR 73 | Ht 61.0 in | Wt 117.6 lb

## 2016-07-25 DIAGNOSIS — G9608 Other cranial cerebrospinal fluid leak: Secondary | ICD-10-CM

## 2016-07-25 DIAGNOSIS — E538 Deficiency of other specified B group vitamins: Secondary | ICD-10-CM

## 2016-07-25 DIAGNOSIS — G20 Parkinson's disease: Secondary | ICD-10-CM | POA: Diagnosis not present

## 2016-07-25 DIAGNOSIS — G960 Cerebrospinal fluid leak: Principal | ICD-10-CM

## 2016-07-25 DIAGNOSIS — D181 Lymphangioma, any site: Secondary | ICD-10-CM | POA: Diagnosis not present

## 2016-07-25 DIAGNOSIS — R413 Other amnesia: Secondary | ICD-10-CM | POA: Diagnosis not present

## 2016-07-25 NOTE — Patient Instructions (Signed)
Remember to drink plenty of fluid, eat healthy meals and do not skip any meals. Try to eat protein with a every meal and eat a healthy snack such as fruit or nuts in between meals. Try to keep a regular sleep-wake schedule and try to exercise daily, particularly in the form of walking, 20-30 minutes a day, if you can.   As far as diagnostic testing: Labs today, NSY referal  I would like to see you back after NSY appt, sooner if we need to. Please call us with any interim questions, concerns, problems, updates or refill requests.   Our phone number is [phone]. We also have an after hours call service for urgent matters and there is a physician on-call for urgent questions. For any emergencies you know to call 911 or go to the nearest emergency room

## 2016-07-25 NOTE — Progress Notes (Signed)
[facility]    Provider:  Dr Jaynee Eagles Referring Provider: Sinda Du, MD Primary Care Physician:  Alonza Bogus, MD  CC:  Memory changes  HPI:  Donna [name] is a 76 y.o. female here as a referral from Dr. Luan Pulling for confusion. Past medical history of  dizziness, hyperlipidemia, hypothyroidism, type 2 diabetes, cardiac arrhythmia, chronic systolic congestive heart failure, osteoarthritis, paroxysmal ventricular tachycardia, atrial fibrillation. Here with daughter who provides much information. Patient says she feels bad for several months, since November. They started noticing worsening memory issues in November, nothing significant before November. Since November they have noticed she can't remember things. Not taking her medication appropriately. Seizures in the family but no hx of Alzheimers or dementia. Mother was 83 and father was 75 when they died and no problems with memory loss.  Grandson lives with patient. She doesn't drive anymore. No accidents in the home. She doesn't cook anymore, grandson does it. They don't let her cook. Personal hygiene is fine, no reminders needed, dresses herself. Patient has needed to help with bills, bills were not being paid. Worsening balance and falls. Significant change since November, prior to this was independent, driving, paying her own bills, cooking and cleaning.   Reviewed notes, labs and imaging from outside physicians, which showed:  She is having problems with memory. CT of the scan was ordered because she can't do an MRI. No chest pain. She is heart failure has been in the hospital for the last several months. She has hypertension and her blood pressure is relatively low. She has a history of ventricular tachycardia and she has a defibrillator in place. Her thyroid and lipids were "okay" a month ago per primary care notes.  CMP 06/08/2016 with elevated glucose otherwise unremarkable. CBC with anemia 10.2 hemoglobin. TSH  4.8.  Personally reviewed imaging and agree with the following:  FINDINGS: BRAIN: The ventricles are normal for age. No intraparenchymal hemorrhage, mass effect nor midline shift. Patchy supratentorial white matter hypodensities less than expected for patient's age, though non-specific are most compatible with chronic small vessel ischemic disease. No acute large vascular territory infarcts. Slightly larger 10 mm bilateral frontal low-density extra-axial fluid collections with mild mass effect on the subjacent sulci. Basal cisterns are patent.  VASCULAR: Moderate calcific atherosclerosis of the carotid siphons.  SKULL: No skull fracture. Subcentimeter bony excrescence from inner table LEFT frontal calvarium compatible with meningioma without mass effect. No significant scalp soft tissue swelling.  SINUSES/ORBITS: The mastoid air-cells and included paranasal sinuses are well-aerated.The included ocular globes and orbital contents are non-suspicious.  OTHER: None.  IMPRESSION: Slightly larger 10 mm bilateral frontal presumed hygromas with mild mass effect, no midline shift.  Subcentimeter LEFT frontal probable meningioma.  Review of Systems: Patient complains of symptoms per HPI as well as the following symptoms: Blurred vision, cough, swelling of legs, feeling cold, memory loss, confusion, slurred speech, dizziness, aching muscles, decreased energy, runny nose Pertinent negatives per HPI. All others negative.   Social History   Social History  . Marital status: Widowed    Spouse name: N/A  . Number of children: 4  . Years of education: 10   Occupational History  . Retired Retired   Social History Main Topics  . Smoking status: Never Smoker  . Smokeless tobacco: Never Used  . Alcohol use No  . Drug use: No  . Sexual activity: No   Other Topics Concern  . Not on file   Social History Narrative   Lives in  [place] with her grandson   Physically active    Right-handed   Caffeine: none    Family History  Problem Relation Age of Onset  . Diabetes Mother   . Kidney disease Mother   . Heart failure Mother   . Stomach cancer Father   . Hypertension Father   . Diabetes Sister   . Seizures Sister   . Heart attack Brother   . Seizures Brother   . Dementia Neg Hx     Past Medical History:  Diagnosis Date  . AICD (automatic cardioverter/defibrillator) present   . Arthritis    "fingers" (11/18/2014)  . Atrial flutter (HCC)    Versus ventricular tachycardia; treated with amiodarone  . Cardiomyopathy, nonischemic ([place])    Nl cors in [place]; CHF in 12/97; EF of 40% in 5/01 and 7/03, 25% in 9/04 and 4/08.  systolic murmur without valvular abnormalities by echo; refused automatic implantable cardiac defibrillator  . Cerebrovascular disease    Duplex study in 12/2006 shows atherosclerosis without focal stenosis  . CHF (congestive heart failure) ([place])   . Diabetes mellitus, type II ([place])    No insulin  . Gastroesophageal reflux disease   . Hyperlipidemia   . Hypertension   . Left bundle branch block   . Ventricular tachycardia ([place])    a. PMH it lists "atrial flutter versus ventricular tachycardia treated with amiodarone" - details of this are unclear. Notes from back to 2006 indicate she has been maintained on amiodarone but do not describe further indication. Patient's sister reports pt was told she had skipped beats that could cause her to drop dead - Dr. Lattie Haw put her on amiodarone and recommended a defibrillator.    Past Surgical History:  Procedure Laterality Date  . ABDOMINAL HYSTERECTOMY  2006  . BI-VENTRICULAR IMPLANTABLE CARDIOVERTER DEFIBRILLATOR  (CRT-D)  11/18/2014  . COLONOSCOPY  2008  . EP IMPLANTABLE DEVICE N/A 11/18/2014   Procedure: BiV ICD Insertion CRT-D;  Surgeon: Evans Lance, MD;  Location: [facility];  Service: Cardiovascular;  Laterality: N/A;  . HERNIA REPAIR Right   . TONSILLECTOMY       Current Outpatient Prescriptions  Medication Sig Dispense Refill  . acetaminophen (TYLENOL) 500 MG tablet Take 500 mg by mouth every 6 (six) hours as needed.    Marland Kitchen amiodarone (PACERONE) 200 MG tablet Take 100 mg by mouth daily at 12 noon.    Marland Kitchen aspirin 81 MG tablet Take 81 mg by mouth daily.    Marland Kitchen atorvastatin (LIPITOR) 20 MG tablet Take 1 tablet (20 mg total) by mouth daily at 6 PM. 30 tablet 12  . calcium gluconate 500 MG tablet Take 500-1,000 mg by mouth 2 (two) times daily. 2 tablets in the morning and 1 tablet at night.    . carvedilol (COREG) 3.125 MG tablet Take 1 tablet (3.125 mg total) by mouth 2 (two) times daily. 60 tablet 11  . Cholecalciferol (VITAMIN D PO) Take 600 mg by mouth daily.     . insulin detemir (LEVEMIR) 100 UNIT/ML injection Inject 12 Units into the skin at bedtime.    . IRON PO Take 1 tablet by mouth daily.    Marland Kitchen levothyroxine (SYNTHROID, LEVOTHROID) 75 MCG tablet Take 75 mcg by mouth daily before breakfast.    . losartan (COZAAR) 25 MG tablet Take 0.5 tablets (12.5 mg total) by mouth daily. 90 tablet 3  . Omega-3 Fatty Acids (FISH OIL PO) Take by mouth 3 (three) times daily.    Marland Kitchen  omeprazole (PRILOSEC) 20 MG capsule Take 20 mg by mouth 2 (two) times daily.      . potassium chloride SA (KLOR-CON M20) 20 MEQ tablet Take 2 Tablets In The Morning And Take 1 Tablet In The Evening 90 tablet 3  . sitaGLIPtin (JANUVIA) 100 MG tablet Take 100 mg by mouth daily.    Marland Kitchen torsemide (DEMADEX) 20 MG tablet Take 1 tablet (20 mg total) by mouth daily. 180 tablet 3  . UNIFINE PENTIPS 32G X 4 MM MISC USE TWICE A DAY TO TAKE INSULIN. 100 each 2   No current facility-administered medications for this visit.     Allergies as of 07/25/2016 - Review Complete 07/25/2016  Allergen Reaction Noted  . Codeine Other (See Comments) 05/28/2009  . Decongestant [pseudoephedrine hcl er] Other (See Comments) 06/14/2012    Vitals: BP 123/79   Pulse 73   Ht 5\' 1"  (1.549 m)   Wt 117 lb 9.6 oz  (53.3 kg)   BMI 22.22 kg/m  Last Weight:  Wt Readings from Last 1 Encounters:  07/25/16 117 lb 9.6 oz (53.3 kg)   Last Height:   Ht Readings from Last 1 Encounters:  07/25/16 5\' 1"  (1.549 m)   Physical exam: Exam: Gen: NAD, Flat affect                   CV: RRR, no MRG. No Carotid Bruits. No peripheral edema, warm, nontender Eyes: Conjunctivae clear without exudates or hemorrhage  Neuro: Detailed Neurologic Exam  Speech:    Speech is normal; fluent and spontaneous with normal comprehension.  Cognition:    The patient is oriented to person, place, and time;     recent and remote memory impaired;     language fluent;     Impaired attention, concentration, fund of knowledge Cranial Nerves:    The pupils are equal, round, and reactive to light. The fundi are normal and spontaneous venous pulsations are present. Visual fields are full to finger confrontation. Impaired upgaze otherwise extraocular movements are intact. Trigeminal sensation is intact and the muscles of mastication are normal. Right NL flattenting. The palate elevates in the midline. Hearing intact. Voice is normal. Shoulder shrug is normal. The tongue has normal motion without fasciculations.   Coordination:    No dysmetria but difficulty performing due to cognition  Gait:    Imbalance, cant walk heel toes or tandem. Narrow gait, mildly shuffling  Motor Observation:    no involuntary movements noted. Tone:    Increased in all extremities.    Posture:    Posture is slightly stooped    Strength:    Strength is 4/V in the proximal upper and lower limbs.      Sensation: intact to LT     Reflex Exam:  DTR's:    Deep tendon reflexes in the upper and lower extremities are brisk bilaterally.   Toes:    The toes are downgoing bilaterally.   Clonus:    Clonus is absent.  Right NL flattenting   Assessment/Plan:  76 year old female with worsening memory since November prior to that she was independently  living, driving without issue. She is having progressive memory changes, imbalance, falls. Exam shows imbalance with inability to walk on heels, toes, narrow and slighly shuffling gait, increased tone. CT shows increased hygromas bilaterally 44mm. This is somewhat chronic was 46mm in 2012 however given increase of hygromas, recent memory and gait changes, and mass effect on the frontal lobes feel NSY referral  is appropriate to eval for drainage of hygromas.  Will check B12 for deficiency.   Cc: Dr. Peter Garter, [facility] [address] [place], [place] [address]  Phone [phone] Fax [phone]

## 2016-07-27 ENCOUNTER — Other Ambulatory Visit: Payer: Self-pay | Admitting: *Deleted

## 2016-07-27 ENCOUNTER — Telehealth: Payer: Self-pay

## 2016-07-27 DIAGNOSIS — G9608 Other cranial cerebrospinal fluid leak: Secondary | ICD-10-CM

## 2016-07-27 DIAGNOSIS — G960 Cerebrospinal fluid leak: Principal | ICD-10-CM

## 2016-07-27 NOTE — Addendum Note (Signed)
Addended by: Monte Fantasia on: 07/27/2016 06:03 PM   Modules accepted: Orders

## 2016-07-27 NOTE — Addendum Note (Signed)
Addended by: Monte Fantasia on: 07/27/2016 06:05 PM   Modules accepted: Orders

## 2016-07-27 NOTE — Telephone Encounter (Signed)
Hi, Anderson Malta looks like status needs to be changed so order can drop correctly I will send to Neuro Surgery today. Thanks Hinton Dyer

## 2016-07-27 NOTE — Telephone Encounter (Signed)
-----   Message from Melvenia Beam, MD sent at 07/26/2016  6:21 PM EST ----- Labs normal thanks

## 2016-07-27 NOTE — Telephone Encounter (Signed)
Called dtr w/ unremarkable lab results. Verbalized understanding and appreciation for call. Daughter inquired about neurosurg referral that was ordered. Will follow-up.

## 2016-07-27 NOTE — Patient Outreach (Signed)
[facility]) Care Management  07/27/2016  LILLI POLCZYNSKI 06-24-1940 [id number]  I spoke briefly today with Mrs. Hemric by phone but she was out to lunch with her sister. I offered to return a call to her over the next few business days and she accepted.   Plan: I will reach out to Mrs. Polimeni by phone in the next 48 hours.    [place] Management  [phone]

## 2016-07-28 ENCOUNTER — Other Ambulatory Visit: Payer: Self-pay | Admitting: *Deleted

## 2016-07-28 LAB — SYPHILIS: RPR W/REFLEX TO RPR TITER AND TREPONEMAL ANTIBODIES, TRADITIONAL SCREENING AND DIAGNOSIS ALGORITHM: RPR Ser Ql: NONREACTIVE

## 2016-07-28 LAB — VITAMIN B12: Vitamin B-12: 955 pg/mL (ref 232–1245)

## 2016-07-28 LAB — METHYLMALONIC ACID, SERUM: Methylmalonic Acid: 184 nmol/L (ref 0–378)

## 2016-07-28 NOTE — Telephone Encounter (Signed)
Referral for Neuro Surgery sent. Thanks Hinton Dyer.

## 2016-07-28 NOTE — Patient Outreach (Signed)
[facility]) Care Management  07/28/2016  Donna [name] June 20, 1940 [id number]  Unable to reach [name] on follow up call today.   Plan: I will reach out to her again next week.    [facility] Management  [phone]

## 2016-08-03 ENCOUNTER — Ambulatory Visit: Payer: Self-pay | Admitting: *Deleted

## 2016-08-05 ENCOUNTER — Other Ambulatory Visit: Payer: Self-pay | Admitting: *Deleted

## 2016-08-05 NOTE — Patient Outreach (Signed)
[facility]) Care Management  08/05/2016  LAMIJA DRUCKER [birth year] [id number]  Unable to reach Mrs. Arenivas by phone this afternoon.   Plan: I will reach out to her next week.    [facility] Management  [phone]

## 2016-08-09 ENCOUNTER — Encounter: Payer: Self-pay | Admitting: Internal Medicine

## 2016-08-10 DIAGNOSIS — D181 Lymphangioma, any site: Secondary | ICD-10-CM | POA: Diagnosis not present

## 2016-08-11 ENCOUNTER — Other Ambulatory Visit: Payer: Self-pay | Admitting: *Deleted

## 2016-08-11 NOTE — Patient Outreach (Signed)
[place] [facility]) Care Management  08/11/2016  Donna [name] November 29, 1940 [id number]  I reached out to Donna [name] today to follow up on her general progress and on her recent diagnostic examinations.   Over the last few months, Donna [name] has been having confusion, dizziness, balance disturbances and falls, and declining ability to perform IADL's. She reported as late as the end of February: Blurred vision, cough, swelling of legs, feeling cold, memory loss, confusion, slurred speech, dizziness, aching muscles, decreased energy, and runny nose. She was sent for CT scan (unable to do MRI because of presence of AICD) which revealed "slightly larger 10 mm bilateral frontal presumed hygromas with mild mass effect, no midline shift. Subcentimeter LEFT frontal probable meningioma." Neurology recommended a neurosurgery evaluation.   When I spoke with Donna [name] she had an understanding that she had recently undergone testing and had "more to come" but was really unable to articulate that the testing was related to her worsening symptoms. She also understood that her cardiologist had made "a lot of changes" in her medication regimen and she had "no idea" what to do with her medications. According to the notation in the medical record by Dr. Harl Bowie, she was only advised to increase her diuretic dose for 3 days then return to her routine regimen. To my knowledge, her daughter is managing her medications and filling a pill box for her. I told her I would see her next week so we can talk about what's going on.   Plan: I have scheduled a routine home visit with Donna [name] for 08/17/17 @ 10am.   THN CM Care Plan Problem One   Flowsheet Row Most Recent Value  Care Plan Problem One  Knowledge Deficits and Care Coordination Needs related to Acute Health Condition (bilateral hygromas) affection cognition, IADL's, home safety  Role Documenting the Problem One  Care Management Coordinator  Care  Plan for Problem One  Active  THN Long Term Goal (31-90 days)  Over the next 60 days, patient and/or family will verbalize understanding of plan of care for treatment of acute neurological condition  THN Long Term Goal Start Date  08/11/16  [facility] Long Term Goal Met Date  06/17/16  Interventions for Problem One Long Term Goal  Discussed with patient new condition findings and symptoms and need for care coordination adn understanding related to treatment plan  THN CM Short Term Goal #1 (0-30 days)  Over the next 30 days, patient will attend all scheduled provider and diagnostic appointments  THN CM Short Term Goal #1 Start Date  08/11/16  THN CM Short Term Goal #1 Met Date    Interventions for Short Term Goal #1  reviewed upcoming provider/diagnostic examination appointment schedule and rationale for ongoing evaluation  THN CM Short Term Goal #2 (0-30 days)  Over the next 30 days, patient will verbalize understanding of how acute health condition affects balance, gait, fall risk and will employ fall risk reduction strategies  THN CM Short Term Goal #2 Start Date  08/11/16  [facility] CM Short Term Goal #2 Met Date  08/12/16  Interventions for Short Term Goal #2  briefly discussed fall risk related to acute health condition and reduction strategies and scheduled home visit for further review  THN CM Short Term Goal #3 (0-30 days)  Over the next 30 days, patient will take all medications as prescribed  THN CM Short Term Goal #3 Start Date  08/11/16  [facility] CM Short Term Goal #3 Met Date  Interventions for Short Tern Goal #3  discussed medication management with patient,  scheduled home visit for detailed review  THN CM Short Term Goal #4 (0-30 days)  Over the next 30 days, patient will keep fluid intake at 6 cups/day  THN CM Short Term Goal #4 Start Date  08/11/16  THN CM Short Term Goal #4 Met Date    Interventions for Short Term Goal #4  REviewed recommendation by cardiologist for 6 cup/day fluid intake     THN CM Care Plan Problem Two   Flowsheet Row Most Recent Value  Care Plan Problem Two  Knowledge Deficits related to Medication Management  Role Documenting the Problem Two  Care Management [place] for Problem Two  Active  Interventions for Problem Two Long Term Goal   reviewed CHF diagnosis and need for ongoing/long term self health management and assitance from family as needed  THN Long Term Goal (31-90) days  Over the next 60 days, patient will verbalize understanding of plan of care for management of CHF  THN Long Term Goal Start Date  08/11/16  THN CM Short Term Goal #1 (0-30 days)  Over the next 30 days, patient/family will verbalize understanding of need for ongoing daily weight and recording and calling provider for weight gain of 3# overnight or 5# in a week  THN CM Short Term Goal #1 Start Date  08/11/16  THN CM Short Term Goal #1 Met Date     Interventions for Short Term Goal #2   reviewed recommendation for daily weight and guidelines for calling provider if weight falls outside establishe parameters  THN CM Short Term Goal #2 (0-30 days)  Over the next 30 days, patient will take all medications related to treatment of heart failure as prescribed  THN CM Short Term Goal #2 Start Date  08/11/16  Interventions for Short Term Goal #2  Discussed medications,  reviewed most recent provider recommendations,  scheduled home visit for more in depth review of medications  THN CM Short Term Goal #3 (0-30 days)  Over the next 30 days, patient/family will verbalize understanding of which medicaitions are prescribed for treatment of CHF  THN CM Short Term Goal #3 Start Date  08/11/16  Interventions for Short Term Goal #3  Medicaitons reviewed,  scheduled home appointment for further review      [place] Care Management  [phone]

## 2016-08-17 ENCOUNTER — Other Ambulatory Visit: Payer: Self-pay | Admitting: *Deleted

## 2016-08-17 NOTE — Patient Outreach (Signed)
[place] [facility]) Care Management   08/17/2016  Donna [name] 1940/06/19 [id number]  Donna [name] is an 76 y.o. female who has had 2 hospital admissions in the last 6 months. Both times she was admitted with CHF. Donna [name] has a history of ventricular tachycardia s/p AICD/BiV pacemaker device placement, diabetes mellitus, type II, and acute respiratory failure with hypoxia.  Donna [name] lives in [place] in her home with an adult grandson. Her sister also lives in the area and has a close and supportiverelationship with Donna [name].   I am seeing Donna [name] at home today to assist with medication management, CHF disease management, and to follow up on gait/balance and cognitive changes.   Subjective: "I don't know why everybody says I can't remember. I don't think its that bad."  Objective: BP 108/60   Pulse 65   Resp (!) 22   SpO2 93%   Review of Systems  Constitutional: Negative.   HENT: Negative.   Eyes: Negative.   Respiratory: Negative for cough and wheezing.   Cardiovascular: Negative for chest pain, palpitations and leg swelling.  Gastrointestinal: Negative.   Genitourinary: Negative.   Musculoskeletal: Negative for falls.  Skin: Negative.   Neurological: Negative.   Psychiatric/Behavioral: Positive for memory loss. The patient is nervous/anxious.     Physical Exam  Constitutional: She is oriented to person, place, and time. Vital signs are normal. She appears well-developed and well-nourished. She is active. She does not have a sickly appearance. She does not appear ill.  Cardiovascular: Normal rate, regular rhythm, S1 normal and S2 normal.  Exam reveals no gallop and no friction rub.   No murmur heard. Respiratory: Effort normal and breath sounds normal. She has no wheezes. She has no rhonchi. She has no rales.  GI: Soft. Bowel sounds are normal.  Neurological: She is alert and oriented to person, place, and time.  Skin: Skin is warm,  dry and intact.  Psychiatric: Her speech is normal and behavior is normal. Her [name] appears anxious. She expresses impulsivity. She exhibits abnormal recent memory.  Mildly anxious about "everybody saying I'm losing my memory"; patient does demonstrate worsening short term memory and difficulty managing IADL's    Encounter Medications:   Outpatient Encounter Prescriptions as of 08/17/2016  Medication Sig  . acetaminophen (TYLENOL) 500 MG tablet Take 500 mg by mouth every 6 (six) hours as needed.  Marland Kitchen amiodarone (PACERONE) 200 MG tablet Take 100 mg by mouth daily at 12 noon.  Marland Kitchen aspirin 81 MG tablet Take 81 mg by mouth daily.  Marland Kitchen atorvastatin (LIPITOR) 20 MG tablet Take 1 tablet (20 mg total) by mouth daily at 6 PM.  . calcium gluconate 500 MG tablet Take 500-1,000 mg by mouth 2 (two) times daily. 2 tablets in the morning and 1 tablet at night.  . carvedilol (COREG) 3.125 MG tablet Take 1 tablet (3.125 mg total) by mouth 2 (two) times daily.  . Cholecalciferol (VITAMIN D PO) Take 600 mg by mouth daily.   . insulin detemir (LEVEMIR) 100 UNIT/ML injection Inject 12 Units into the skin at bedtime.  . IRON PO Take 1 tablet by mouth daily.  Marland Kitchen levothyroxine (SYNTHROID, LEVOTHROID) 75 MCG tablet Take 75 mcg by mouth daily before breakfast.  . losartan (COZAAR) 25 MG tablet Take 0.5 tablets (12.5 mg total) by mouth daily.  . Omega-3 Fatty Acids (FISH OIL PO) Take by mouth 3 (three) times daily.  Marland Kitchen omeprazole (PRILOSEC) 20 MG capsule Take 20 mg by  mouth 2 (two) times daily.    . potassium chloride SA (KLOR-CON M20) 20 MEQ tablet Take 2 Tablets In The Morning And Take 1 Tablet In The Evening  . sitaGLIPtin (JANUVIA) 100 MG tablet Take 100 mg by mouth daily.  Marland Kitchen torsemide (DEMADEX) 40 MG tablet Take 1 tablet (40 mg total) by mouth daily.  Marland Kitchen UNIFINE PENTIPS 32G X 4 MM MISC USE TWICE A DAY TO TAKE INSULIN.   Assessment:  Pleasant 76 year old lady with CHF and PPM/AICD living in [place] in her home with  her adult grandson. She is having difficulty with medication management and has been having new gait/balance and cognitive symptoms.  Cognitive and Balance/Gait changes - Over the last few months, Donna [name] has been having confusion, dizziness, balance disturbances and falls, and declining ability to perform IADL's. She reported as late as the end of February: Blurred vision, cough, swelling of legs, feeling cold, memory loss, confusion, slurred speech, dizziness, aching muscles, decreased energy, and runny nose. She was sent for CT scan (unable to do MRI because of presence of AICD) which revealed "slightly larger 10 mm bilateral frontal presumed hygromas with mild mass effect, no midline shift. Subcentimeter LEFT frontal probable meningioma." Neurology recommended a neurosurgery evaluation. The neurologist did not feel that all of Donna [name]'s current memory decline could be correlated with the hygromas and felt she should be managed medically.   RECOMMENDATION: Home Health PT eval for strengthening, conditioning, gait and balance and evaluation for need of DME, adaptive equipment.   Chronic Health Condition (CHF)/Medication Management needs - Donna [name] continues to weigh herself daily and record; her weight has remained stable; she does not have shortness of breath, swelling, or edema. She is having difficulty with medication management. Her daughter has been helping her with filling pill boxes but until very recently, Donna [name] had good knowledge of her medications and was able to manage them herself. We reviewed her medications today in detail. Her daughter and grand son are managing her medications and she is taking medications exactly as prescribed.    Plan:   I will request HHPT order from Dr. Luan [name].   Donna [name]'s daughter Donna [name] and her grandson will continue filling medication box daily.   Daughter Donna [name] will help remind Donna [name] with daily weight monitoring and  will call for weight gain of 3# overnight or 5 # in a week.   I will follow up with Mrs. Forti and Cheryl by phone.   THN CM Care Plan Problem One   Flowsheet Row Most Recent Value  Care Plan Problem One  Knowledge Deficits and Care Coordination Needs related to Acute Health Condition (bilateral hygromas) affection cognition, IADL's, home safety  Role Documenting the Problem One  Care Management Coordinator  Care Plan for Problem One  Active  THN Long Term Goal (31-90 days)  Over the next 60 days, patient and/or family will verbalize understanding of plan of care for treatment of acute neurological condition  THN Long Term Goal Start Date  08/11/16  Interventions for Problem One Long Term Goal  Discussed with patient new condition findings and symptoms and need for care coordination adn understanding related to treatment plan  THN CM Short Term Goal #1 (0-30 days)  Over the next 30 days, patient will attend all scheduled provider and diagnostic appointments  THN CM Short Term Goal #1 Start Date  08/11/16  Interventions for Short Term Goal #1  reviewed upcoming provider/diagnostic examination appointment schedule and  rationale for ongoing evaluation  THN CM Short Term Goal #2 (0-30 days)  Over the next 30 days, patient will verbalize understanding of how acute health condition affects balance, gait, fall risk and will employ fall risk reduction strategies  THN CM Short Term Goal #2 Start Date  08/11/16  Interventions for Short Term Goal #2  briefly discussed fall risk related to acute health condition and reduction strategies and scheduled home visit for further review  THN CM Short Term Goal #3 (0-30 days)  Over the next 30 days, patient will take all medications as prescribed  THN CM Short Term Goal #3 Start Date  08/11/16  Interventions for Short Tern Goal #3  discussed medication management with patient,  scheduled home visit for detailed review    [facility] CM Care Plan Problem Two    Flowsheet Row Most Recent Value  Care Plan for Problem Two  Active  Interventions for Problem Two Long Term Goal   reviewed CHF diagnosis and need for ongoing/long term self health management and assitance from family as needed  THN Long Term Goal (31-90) days  Over the next 60 days, patient will verbalize understanding of plan of care for management of CHF  THN Long Term Goal Start Date  08/11/16  THN CM Short Term Goal #1 (0-30 days)  Over the next 30 days, patient/family will verbalize understanding of need for ongoing daily weight and recording and calling provider for weight gain of 3# overnight or 5# in a week  THN CM Short Term Goal #1 Start Date  08/11/16  Interventions for Short Term Goal #2   reviewed recommendation for daily weight and guidelines for calling provider if weight falls outside establishe parameters  THN CM Short Term Goal #2 (0-30 days)  Over the next 30 days, patient will take all medications related to treatment of heart failure as prescribed  THN CM Short Term Goal #2 Start Date  08/11/16  Interventions for Short Term Goal #2  Discussed medications,  reviewed most recent provider recommendations,  scheduled home visit for more in depth review of medications  THN CM Short Term Goal #3 (0-30 days)  Over the next 30 days, patient/family will verbalize understanding of which medicaitions are prescribed for treatment of CHF  THN CM Short Term Goal #3 Start Date  08/11/16  Interventions for Short Term Goal #3  Medicaitons reviewed,  scheduled home appointment for further review    [facility] CM Care Plan Problem Three   Flowsheet Row Most Recent Value  Care Plan Problem Three  Deconditioning with gait/balance disturbance  Role Documenting the Problem Three  Care Management Coordinator  Care Plan for Problem Three  Active  THN Long Term Goal (31-90) days  Over the next 31 days, patient will report improvement in strength, gait, balance as evidenced by improved independence in  movement and self care  [facility] Long Term Goal Start Date  08/17/16  Interventions for Problem Three Long Term Goal  Discussed deconditioning, gait/balance disturbance with patient and daughter,  requested HHPT order from PCP  [facility] CM Short Term Goal #1 (0-30 days)  Over the next 14 days, patient will verbalize initiation of HHPT services  [facility] CM Short Term Goal #1 Start Date  08/17/16  Interventions for Short Term Goal #1  reqeusted HHPT order        [facility] Care Management  [phone]

## 2016-08-24 ENCOUNTER — Other Ambulatory Visit: Payer: Self-pay | Admitting: *Deleted

## 2016-08-24 NOTE — Patient Outreach (Signed)
[place] [facility]) Care Management  08/24/2016  Donna [name] 13-Apr-1941 [id number]  Call received from [name] re: concerns about Donna [name] having confusion and "seeing things that aren't there." When Donna [name] arrived to visit this morning, Donna [name] told her "a little girl slept in the bed with me all last night and her Dad is upstairs in the attic, working." Donna [name] also told her daughter that the snow globe in the living room was an ash tray and Donna [name]'s daughter could not convince her otherwise.   Malachy Mood spoke with Donna [name]'s grandson who lives in the home and while he didn't hear any disturbance during the night, he related that when he went to Donna [name]'s room to check on her before leaving for work, she told the same story to him about the little girl and her father being upstairs. The grandson checked the house and indicated that nothing was out of place and no one besides the two of them were in the house.   Malachy Mood has been putting all scheduled medications in Donna [name]'s pill box weekly. We reviewed her medications one by one on my last visit. The grandson keeps the pill box secured in his bedroom until time for Donna [name] to take them each morning when he administers them to her. The same routine is followed each evening. However, Donna [name] has access to her prn Alprazolam. She is prescribed 1mg  TID prn #40. I called [place] and the pharmacist told me that she was not due a refill until the 28th. Malachy Mood has been giving Donna [name] 1/2 tablet qhs and was not aware that Donna [name] was taking more. However, when [place] checked Donna [name]'s bottle today, it was empty and Donna [name] didn't know how long it had been empty.   I talked with Donna [name] who denied urinary symptoms of any kind and told me she has not had fever (confirmed by Malachy Mood). She did report that her left ear was hurting today but Malachy Mood told  me that Donna [name] had a little difficulty deciding which ear had been hurting when she spoke with her about it earlier.   I advised Malachy Mood to continue with support care and not to hesitate to call 911 if Donna [name]'s congnitive changes worsened or if she had behavioral changes. I notified Dr. Luan Pulling via Alain Honey at his office.   Plan: I will follow up with Dr. Luan Pulling and with Donna [name] and her daughter tomorrow by phone.    [place] Management  [phone]

## 2016-08-25 ENCOUNTER — Other Ambulatory Visit: Payer: Self-pay | Admitting: *Deleted

## 2016-08-25 NOTE — Patient Outreach (Signed)
[facility]) Care Management  08/25/2016  Donna [name] Oct 24, 1940 [id number]  I spoke with Donna [name]'s daughter Donna [name] today re: ongoing concerns about Donna [name] having confusion hallucinations. On Tuesday, Donna [name] reported Donna [name] "seeing things that aren't there" ("a little girl slept in the bed with me all last night and her Dad is upstairs in the attic, working", "a man has been outside mowing the yard all day", and being unable to correctly identify objects Donna [name] has owned for years (thinks the snow globe is an Administrator).   Today, I spoke with Dr. Luan Pulling in person who has ordered a neuro evaluation for Donna [name]. I returned a call to Donna [name] to notify her. Donna [name] has brought Donna [name] to her home until a plan can be developed to address the changes going on with Donna [name]. She will be attended 24/7 either by Donna [name] or her husband.   I advised Donna [name] to continue with support care and not to hesitate to call 911 if Donna [name]'s congnitive changes worsened or if she had behavioral changes.   Plan: I will follow up with Donna [name] re: Donna [name] progress no later than first of next week.    [place] Management  [phone]

## 2016-08-27 DIAGNOSIS — I5022 Chronic systolic (congestive) heart failure: Secondary | ICD-10-CM | POA: Diagnosis not present

## 2016-08-27 DIAGNOSIS — R69 Illness, unspecified: Secondary | ICD-10-CM | POA: Diagnosis not present

## 2016-08-27 DIAGNOSIS — Z95810 Presence of automatic (implantable) cardiac defibrillator: Secondary | ICD-10-CM | POA: Diagnosis not present

## 2016-08-27 DIAGNOSIS — K219 Gastro-esophageal reflux disease without esophagitis: Secondary | ICD-10-CM | POA: Diagnosis not present

## 2016-08-27 DIAGNOSIS — I110 Hypertensive heart disease with heart failure: Secondary | ICD-10-CM | POA: Diagnosis not present

## 2016-08-27 DIAGNOSIS — E119 Type 2 diabetes mellitus without complications: Secondary | ICD-10-CM | POA: Diagnosis not present

## 2016-08-27 DIAGNOSIS — E785 Hyperlipidemia, unspecified: Secondary | ICD-10-CM | POA: Diagnosis not present

## 2016-08-27 DIAGNOSIS — M2550 Pain in unspecified joint: Secondary | ICD-10-CM | POA: Diagnosis not present

## 2016-08-27 DIAGNOSIS — R42 Dizziness and giddiness: Secondary | ICD-10-CM | POA: Diagnosis not present

## 2016-08-27 DIAGNOSIS — Z7409 Other reduced mobility: Secondary | ICD-10-CM | POA: Diagnosis not present

## 2016-08-29 DIAGNOSIS — Z7409 Other reduced mobility: Secondary | ICD-10-CM | POA: Diagnosis not present

## 2016-08-29 DIAGNOSIS — K219 Gastro-esophageal reflux disease without esophagitis: Secondary | ICD-10-CM | POA: Diagnosis not present

## 2016-08-29 DIAGNOSIS — E119 Type 2 diabetes mellitus without complications: Secondary | ICD-10-CM | POA: Diagnosis not present

## 2016-08-29 DIAGNOSIS — E785 Hyperlipidemia, unspecified: Secondary | ICD-10-CM | POA: Diagnosis not present

## 2016-08-29 DIAGNOSIS — R42 Dizziness and giddiness: Secondary | ICD-10-CM | POA: Diagnosis not present

## 2016-08-29 DIAGNOSIS — Z95810 Presence of automatic (implantable) cardiac defibrillator: Secondary | ICD-10-CM | POA: Diagnosis not present

## 2016-08-29 DIAGNOSIS — I5022 Chronic systolic (congestive) heart failure: Secondary | ICD-10-CM | POA: Diagnosis not present

## 2016-08-29 DIAGNOSIS — I110 Hypertensive heart disease with heart failure: Secondary | ICD-10-CM | POA: Diagnosis not present

## 2016-08-29 DIAGNOSIS — R69 Illness, unspecified: Secondary | ICD-10-CM | POA: Diagnosis not present

## 2016-08-29 DIAGNOSIS — M2550 Pain in unspecified joint: Secondary | ICD-10-CM | POA: Diagnosis not present

## 2016-08-31 ENCOUNTER — Encounter: Payer: Self-pay | Admitting: Physician Assistant

## 2016-08-31 ENCOUNTER — Ambulatory Visit (INDEPENDENT_AMBULATORY_CARE_PROVIDER_SITE_OTHER): Payer: Medicare HMO | Admitting: Physician Assistant

## 2016-08-31 ENCOUNTER — Other Ambulatory Visit (HOSPITAL_COMMUNITY)
Admission: RE | Admit: 2016-08-31 | Discharge: 2016-08-31 | Disposition: A | Discharge status: Home / Self Care | Payer: Medicare HMO | Source: Ambulatory Visit | Attending: Physician Assistant | Admitting: Physician Assistant

## 2016-08-31 VITALS — BP 94/54 | HR 62 | Ht 62.0 in | Wt 112.2 lb

## 2016-08-31 DIAGNOSIS — K219 Gastro-esophageal reflux disease without esophagitis: Secondary | ICD-10-CM | POA: Diagnosis not present

## 2016-08-31 DIAGNOSIS — I10 Essential (primary) hypertension: Secondary | ICD-10-CM | POA: Insufficient documentation

## 2016-08-31 DIAGNOSIS — E785 Hyperlipidemia, unspecified: Secondary | ICD-10-CM | POA: Diagnosis not present

## 2016-08-31 DIAGNOSIS — R410 Disorientation, unspecified: Secondary | ICD-10-CM

## 2016-08-31 DIAGNOSIS — R42 Dizziness and giddiness: Secondary | ICD-10-CM | POA: Diagnosis not present

## 2016-08-31 DIAGNOSIS — I110 Hypertensive heart disease with heart failure: Secondary | ICD-10-CM | POA: Diagnosis not present

## 2016-08-31 DIAGNOSIS — I428 Other cardiomyopathies: Secondary | ICD-10-CM | POA: Diagnosis not present

## 2016-08-31 DIAGNOSIS — Z95810 Presence of automatic (implantable) cardiac defibrillator: Secondary | ICD-10-CM | POA: Diagnosis not present

## 2016-08-31 DIAGNOSIS — E119 Type 2 diabetes mellitus without complications: Secondary | ICD-10-CM | POA: Diagnosis not present

## 2016-08-31 DIAGNOSIS — I5022 Chronic systolic (congestive) heart failure: Secondary | ICD-10-CM | POA: Diagnosis not present

## 2016-08-31 DIAGNOSIS — R69 Illness, unspecified: Secondary | ICD-10-CM | POA: Diagnosis not present

## 2016-08-31 DIAGNOSIS — Z7409 Other reduced mobility: Secondary | ICD-10-CM | POA: Diagnosis not present

## 2016-08-31 DIAGNOSIS — M2550 Pain in unspecified joint: Secondary | ICD-10-CM | POA: Diagnosis not present

## 2016-08-31 LAB — BASIC METABOLIC PANEL
Anion gap: 5 (ref 5–15)
BUN: 21 mg/dL — AB (ref 6–20)
CHLORIDE: 98 mmol/L — AB (ref 101–111)
CO2: 32 mmol/L (ref 22–32)
CREATININE: 0.85 mg/dL (ref 0.44–1.00)
Calcium: 8.8 mg/dL — ABNORMAL LOW (ref 8.9–10.3)
GFR calc Af Amer: >60 mL/min (ref >60)
GFR calc non Af Amer: >60 mL/min (ref >60)
GLUCOSE: 139 mg/dL — AB (ref 65–99)
Potassium: 4.1 mmol/L (ref 3.5–5.1)
SODIUM: 135 mmol/L (ref 135–145)

## 2016-08-31 NOTE — Patient Outreach (Signed)
[facility] [facility]) Care Management  08/31/2016  Donna [name] 1940-12-14 [id number]  Follow up with [name] and her daughter Donna [name] who is primary caregiver. Donna [name] is currently working with the home health physical therapist.   Donna [name] says she feels Donna [name]'s confusion has improved but her memory is still poor. She denies that [name] has reported any further hallucinations. Donna [name] provided information about [name] progress in the following areas:   1) Diabetes Management - [name] has not been taking her insulin. Donna [name] found that the needle on [name] insulin pen was bent and that [name] had not been taking her insulin at all. She checked her cbg's several times starting the middle of last week and said her findings were as high as 287. Donna [name] began giving [name] her prescribed dose of insulin and her cbg's are now in the 120's.   2) Blood pressure management - Donna [name] said that [name] blood pressure was lower than usual today when she was at the cardiology office and again at home when the physical therapist checked it. It was in the 90's/50's both times. No changes were made in her medication regimen at the cardiology visit but Donna [name] was advised to continue to check her bp's at home. She has a 2 month follow up visit scheduled with Dr. Harl Bowie.   3) Neurology - [name] is scheduled to see the neurologist on April 10 to follow up on acute changes related to memory and cognition. [name] is still being attended 24/7 by her daughter/son in law/grandson for the time being.   Plan: [name] has changed to [facility] insurance and [facility] will no longer be able to follow [name]. I have explained this to [name] and to her daughter Donna [name] who both verbalize understanding. I have ensured that Donna [name] and [name] have contact information for all of [name]'s  providers. I will close [name] case today.    [facility] Management  [phone]

## 2016-08-31 NOTE — Progress Notes (Signed)
Cardiology Office Note    Date:  08/31/2016   ID:  Shareeka, Yim Nov 14, 1940, MRN [id number]  PCP:  Alonza Bogus, MD  Cardiologist: Dr. Harl Bowie EPS Dr. Lovena Le  Chief Complaint  Patient presents with  . Follow-up    History of Present Illness:  Donna [name] is a 76 y.o. female  with history of nonischemic cardiomyopathy prior cath without obstructive disease. LVEF 20% by echo in 2013, 15-20% on echo 10/2014. She has a biV AICD followed by Dr. Lovena Le. She saw Dr. Harl Bowie 05/12/16 complaining of chronic fatigue and dizziness. He he was down titrating her CHF meds to see if symptoms improved. Also has history of ventricular tachycardia but no recent palpitations. Last saw Dr. Harl Bowie 07/19/16 and had some increased lower extremity edema. He increased her torsemide to 30 mg daily for 2 days then back to 20 mg daily.   Patient has had decline in her memory and was seen by neurology.   she was found to have bilateral enlarging subdural hygroma is that he was referring to neurosurgery to see if drainage was appropriate.  Patient is being followed by [facility] and her daughter is now managing her meds which has helped.  Patient comes in today accompanied by her daughter. She had 4 days of hallucinations and they finally realized she hadn't been taking her insulin. Once she started back on her insulin shots her hallucinations resolved. Her breathing has been stable. She has chronic dizziness at rest but has walked with physical therapy without difficulty. She says she doesn't get dizzy when she stands up. Her blood pressure is running low today. She's lost 5 pounds since we last saw her. She still has mild left ankle edema. They got compression stockings but she is not wearing them.     Past Medical History:  Diagnosis Date  . AICD (automatic cardioverter/defibrillator) present   . Arthritis    "fingers" (11/18/2014)  . Atrial flutter (HCC)    Versus ventricular tachycardia; treated with  amiodarone  . Cardiomyopathy, nonischemic ([place])    Nl cors in [place]; CHF in 12/97; EF of 40% in 5/01 and 7/03, 25% in 9/04 and 4/08.  systolic murmur without valvular abnormalities by echo; refused automatic implantable cardiac defibrillator  . Cerebrovascular disease    Duplex study in 12/2006 shows atherosclerosis without focal stenosis  . CHF (congestive heart failure) ([place])   . Diabetes mellitus, type II ([place])    No insulin  . Gastroesophageal reflux disease   . Hyperlipidemia   . Hypertension   . Left bundle branch block   . Ventricular tachycardia ([place])    a. PMH it lists "atrial flutter versus ventricular tachycardia treated with amiodarone" - details of this are unclear. Notes from back to 2006 indicate she has been maintained on amiodarone but do not describe further indication. Patient's sister reports pt was told she had skipped beats that could cause her to drop dead - Dr. Lattie Haw put her on amiodarone and recommended a defibrillator.    Past Surgical History:  Procedure Laterality Date  . ABDOMINAL HYSTERECTOMY  2006  . BI-VENTRICULAR IMPLANTABLE CARDIOVERTER DEFIBRILLATOR  (CRT-D)  11/18/2014  . COLONOSCOPY  2008  . EP IMPLANTABLE DEVICE N/A 11/18/2014   Procedure: BiV ICD Insertion CRT-D;  Surgeon: Evans Lance, MD;  Location: [place] [facility];  Service: Cardiovascular;  Laterality: N/A;  . HERNIA REPAIR Right   . TONSILLECTOMY      Current Medications: Outpatient Medications Prior  to Visit  Medication Sig Dispense Refill  . acetaminophen (TYLENOL) 500 MG tablet Take 500 mg by mouth every 6 (six) hours as needed.    Marland Kitchen amiodarone (PACERONE) 200 MG tablet Take 100 mg by mouth daily at 12 noon.    Marland Kitchen aspirin 81 MG tablet Take 81 mg by mouth daily.    Marland Kitchen atorvastatin (LIPITOR) 20 MG tablet Take 1 tablet (20 mg total) by mouth daily at 6 PM. 30 tablet 12  . carvedilol (COREG) 3.125 MG tablet Take 1 tablet (3.125 mg total) by mouth 2 (two) times daily. 60 tablet 11    . insulin detemir (LEVEMIR) 100 UNIT/ML injection Inject 12 Units into the skin at bedtime.    . IRON PO Take 1 tablet by mouth daily.    Marland Kitchen levothyroxine (SYNTHROID, LEVOTHROID) 75 MCG tablet Take 75 mcg by mouth daily before breakfast.    . losartan (COZAAR) 25 MG tablet Take 0.5 tablets (12.5 mg total) by mouth daily. 90 tablet 3  . Omega-3 Fatty Acids (FISH OIL PO) Take by mouth 3 (three) times daily.    Marland Kitchen omeprazole (PRILOSEC) 20 MG capsule Take 20 mg by mouth 2 (two) times daily.      . potassium chloride SA (KLOR-CON M20) 20 MEQ tablet Take 2 Tablets In The Morning And Take 1 Tablet In The Evening 90 tablet 3  . sitaGLIPtin (JANUVIA) 100 MG tablet Take 100 mg by mouth daily.    Marland Kitchen torsemide (DEMADEX) 20 MG tablet Take 1 tablet (20 mg total) by mouth daily. 180 tablet 3  . UNIFINE PENTIPS 32G X 4 MM MISC USE TWICE A DAY TO TAKE INSULIN. 100 each 2  . calcium gluconate 500 MG tablet Take 500-1,000 mg by mouth 2 (two) times daily. 2 tablets in the morning and 1 tablet at night.    . Cholecalciferol (VITAMIN D PO) Take 600 mg by mouth daily.      No facility-administered medications prior to visit.      Allergies:   Codeine and Decongestant [pseudoephedrine hcl er]   Social History   Social History  . Marital status: Widowed    Spouse name: N/A  . Number of children: 4  . Years of education: 10   Occupational History  . Retired Retired   Social History Main Topics  . Smoking status: Never Smoker  . Smokeless tobacco: Never Used  . Alcohol use No  . Drug use: No  . Sexual activity: No   Other Topics Concern  . None   Social History Narrative   Lives in [place] with her grandson   Physically active   Right-handed   Caffeine: none     Family History:  The patient's   family history includes Diabetes in her mother and sister; Heart attack in her brother; Heart failure in her mother; Hypertension in her father; Kidney disease in her mother; Seizures in her brother and  sister; Stomach cancer in her father.   ROS:   Please see the history of present illness.    Review of Systems  Constitution: Positive for weakness and malaise/fatigue.  HENT: Negative.   Eyes: Positive for visual disturbance.  Cardiovascular: Positive for leg swelling.  Respiratory: Negative.   Hematologic/Lymphatic: Negative.   Musculoskeletal: Negative.  Negative for joint pain.  Gastrointestinal: Negative.   Genitourinary: Negative.   Neurological: Positive for excessive daytime sleepiness.  Psychiatric/Behavioral: Positive for hallucinations and memory loss.   All other systems reviewed and are negative.   PHYSICAL  EXAM:   VS:  BP (!) 94/54   Pulse 62   Ht 5\' 2"  (1.575 m)   Wt 112 lb 3.2 oz (50.9 kg)   BMI 20.52 kg/m   Physical Exam  GEN: Well nourished, well developed, in no acute distress  Neck: no JVD, carotid bruits, or masses Cardiac:RRR; no murmurs, rubs, or gallops  Respiratory:  clear to auscultation bilaterally, normal work of breathing GI: soft, nontender, nondistended, + BS Ext: without cyanosis, clubbing, or edema, Good distal pulses bilaterally Psych: euthymic mood, full affect  Wt Readings from Last 3 Encounters:  08/31/16 112 lb 3.2 oz (50.9 kg)  07/25/16 117 lb 9.6 oz (53.3 kg)  07/19/16 118 lb (53.5 kg)      Studies/Labs Reviewed:   EKG:  EKG is not ordered today.   Recent Labs: 02/24/2016: TSH 4.800 02/26/2016: B Natriuretic Peptide 785.0 03/09/2016: Magnesium 2.3 06/08/2016: ALT 32; BUN 15; Creatinine, Ser 0.81; Hemoglobin 10.2; Platelets 227; Potassium 4.6; Sodium 138   Lipid Panel    Component Value Date/Time   CHOL 113 02/24/2016 0923   TRIG 83 02/24/2016 0923   HDL 36 (L) 02/24/2016 0923   CHOLHDL 5.1 05/09/2008 0332   VLDL 42 (H) 05/09/2008 0332   LDLCALC 60 02/24/2016 0923    Additional studies/ records that were reviewed today include:  06/2011 Echo   LVEF 20%, mildly dilated, mild LVH, grade I diastolic dysfunction, mild to  mod MR, PASP 36    10/04/13 Echo   Study Conclusions  - Left ventricle: Left ventricular systolic function is severely reduced, EF 15-20%. Severe global hypokinesis to akinesis is seen. The cavity size was moderately to severely dilated. Wall thickness was increased in a pattern of mild LVH. Doppler parameters are consistent with abnormal left ventricular relaxation (grade 1 diastolic dysfunction). Doppler parameters are consistent with high ventricular filling pressure. - Regional wall motion abnormality: Akinesis of the mid anterior, mid anteroseptal, basal-mid inferoseptal, apical septal, and apical myocardium; severe hypokinesis of the mid inferior and apical lateral myocardium; moderate hypokinesis of the mid inferolateral and basal-mid anterolateral myocardium. - Aortic valve: Mildly calcified annulus. Mildly thickened leaflets. - Mitral valve: Mildly dilated annulus. Mildly thickened leaflets . Mild to moderate regurgitation. - Left atrium: The atrium was moderately dilated. - Tricuspid valve: Mild regurgitation. - Pulmonary arteries: PA peak pressure: 80mm Hg (S). Mildly elevated pulmonary pressures. - Systemic veins: IVC is dilated, with normal respiratory variation. Estimated CVP 8 mmHg. - Pericardium, extracardiac: A trivial pericardial effusion was identified posterior to the heart.   10/2014 Echo Study Conclusions   - Left ventricle: There is global hypokinesis with akinesis of the   anterior, inferior walls and paradoxical septal motion. Overall   LVEF is severely refused estimated at 15-20%. The cavity size was   mildly dilated. There was moderate concentric hypertrophy.   Systolic function was severely reduced. The estimated ejection   fraction was in the range of 15-20%. Features are consistent with   a pseudonormal left ventricular filling pattern, with concomitant   abnormal relaxation and increased filling pressure (grade 2   diastolic dysfunction). Doppler  parameters are consistent with   elevated ventricular end-diastolic filling pressure. - Ventricular septum: Septal motion showed paradox. - Aortic valve: Trileaflet; normal thickness leaflets. There was no   regurgitation. - Aortic root: The aortic root was normal in size. - Mitral valve: Structurally normal valve. There was moderate   regurgitation. - Left atrium: The atrium was normal in size. - Right ventricle: Systolic  function was mildly reduced. - Right atrium: The atrium was normal in size. - Tricuspid valve: There was mild regurgitation. - Pulmonary arteries: Systolic pressure was within the normal   range. PA peak pressure: 34 mm Hg (S). - Inferior vena cava: The vessel was normal in size. - Pericardium, extracardiac: A trivial pericardial effusion was   identified.     2-D echo 02/26/16 Study Conclusions   - Left ventricle: The cavity size was mildly dilated. Wall   thickness was increased in a pattern of mild LVH. Systolic   function was severely reduced. The estimated ejection fraction   was in the range of 20% to 25%. Diffuse hypokinesis. Features are   consistent with a pseudonormal left ventricular filling pattern,   with concomitant abnormal relaxation and increased filling   pressure (grade 2 diastolic dysfunction). Doppler parameters are   consistent with high ventricular filling pressure. - Mitral valve: There was mild regurgitation. - Left atrium: The atrium was moderately to severely dilated. - Right atrium: The atrium was moderately dilated.   -------------------------------------------------------------------      ASSESSMENT:    1. NICM (nonischemic cardiomyopathy) ([place])   2. Chronic systolic heart failure ([place])   3. Essential hypertension   4. Confusion      PLAN:  In order of problems listed above:  NICM/ chronic systolic CHF compensated. She still has some dizziness and her blood pressure is low but she is working with physical therapy. She  has a 5 pound weight loss. We may may need to stop carvedilol or losartan if her blood pressure continues to drop.THN is monitoring at home. Continue current medications. Check bmet today. Follow-up with Dr. Harl Bowie in 2 months.  Essential hypertension blood pressure low today  Confusion with recent hallucinations resolved when she resumed her insulin shots which she had forgotten to use. Also has subdural hygromas that have grown but neurosurgery does not want to operate. She is to follow-up with neurology April 10.     Medication Adjustments/Labs and Tests Ordered: Current medicines are reviewed at length with the patient today.  Concerns regarding medicines are outlined above.  Medication changes, Labs and Tests ordered today are listed in the Patient Instructions below. There are no Patient Instructions on file for this visit.   Signed, Donna Barrios, PA-C  08/31/2016 1:16 PM    [facility] [address], [place], [place]  [address] Phone: [phone]; Fax: [phone]

## 2016-08-31 NOTE — Patient Instructions (Signed)
Your physician recommends that you schedule a follow-up appointment in: 1-2 Months with Dr. Harl Bowie  Your physician recommends that you continue on your current medications as directed. Please refer to the Current Medication list given to you today.  Your physician recommends that you return for lab work in: Today  If you need a refill on your cardiac medications before your next appointment, please call your pharmacy.  Thank you for choosing [facility]!

## 2016-09-01 ENCOUNTER — Other Ambulatory Visit: Payer: Self-pay | Admitting: *Deleted

## 2016-09-01 ENCOUNTER — Encounter: Payer: Self-pay | Admitting: *Deleted

## 2016-09-02 ENCOUNTER — Telehealth: Payer: Self-pay

## 2016-09-02 DIAGNOSIS — I5022 Chronic systolic (congestive) heart failure: Secondary | ICD-10-CM | POA: Diagnosis not present

## 2016-09-02 DIAGNOSIS — Z7409 Other reduced mobility: Secondary | ICD-10-CM | POA: Diagnosis not present

## 2016-09-02 DIAGNOSIS — K219 Gastro-esophageal reflux disease without esophagitis: Secondary | ICD-10-CM | POA: Diagnosis not present

## 2016-09-02 DIAGNOSIS — E785 Hyperlipidemia, unspecified: Secondary | ICD-10-CM | POA: Diagnosis not present

## 2016-09-02 DIAGNOSIS — R69 Illness, unspecified: Secondary | ICD-10-CM | POA: Diagnosis not present

## 2016-09-02 DIAGNOSIS — Z95810 Presence of automatic (implantable) cardiac defibrillator: Secondary | ICD-10-CM | POA: Diagnosis not present

## 2016-09-02 DIAGNOSIS — M2550 Pain in unspecified joint: Secondary | ICD-10-CM | POA: Diagnosis not present

## 2016-09-02 DIAGNOSIS — E119 Type 2 diabetes mellitus without complications: Secondary | ICD-10-CM | POA: Diagnosis not present

## 2016-09-02 DIAGNOSIS — R42 Dizziness and giddiness: Secondary | ICD-10-CM | POA: Diagnosis not present

## 2016-09-02 DIAGNOSIS — I110 Hypertensive heart disease with heart failure: Secondary | ICD-10-CM | POA: Diagnosis not present

## 2016-09-02 NOTE — Telephone Encounter (Signed)
Patient referred to [facility] clinic by Debroah Loop, Device RN/Dr Lovena Le.  Attempted ICM intro call and no answer.  Remote transmission scheduled for 09/12/2016 and will provide ICM intro at that time.

## 2016-09-04 DIAGNOSIS — I639 Cerebral infarction, unspecified: Secondary | ICD-10-CM

## 2016-09-04 HISTORY — DX: Cerebral infarction, unspecified: I63.9

## 2016-09-05 DIAGNOSIS — R42 Dizziness and giddiness: Secondary | ICD-10-CM | POA: Diagnosis not present

## 2016-09-05 DIAGNOSIS — I5022 Chronic systolic (congestive) heart failure: Secondary | ICD-10-CM | POA: Diagnosis not present

## 2016-09-05 DIAGNOSIS — E785 Hyperlipidemia, unspecified: Secondary | ICD-10-CM | POA: Diagnosis not present

## 2016-09-05 DIAGNOSIS — K219 Gastro-esophageal reflux disease without esophagitis: Secondary | ICD-10-CM | POA: Diagnosis not present

## 2016-09-05 DIAGNOSIS — R69 Illness, unspecified: Secondary | ICD-10-CM | POA: Diagnosis not present

## 2016-09-05 DIAGNOSIS — M2550 Pain in unspecified joint: Secondary | ICD-10-CM | POA: Diagnosis not present

## 2016-09-05 DIAGNOSIS — E119 Type 2 diabetes mellitus without complications: Secondary | ICD-10-CM | POA: Diagnosis not present

## 2016-09-05 DIAGNOSIS — I110 Hypertensive heart disease with heart failure: Secondary | ICD-10-CM | POA: Diagnosis not present

## 2016-09-05 DIAGNOSIS — Z95810 Presence of automatic (implantable) cardiac defibrillator: Secondary | ICD-10-CM | POA: Diagnosis not present

## 2016-09-05 DIAGNOSIS — Z7409 Other reduced mobility: Secondary | ICD-10-CM | POA: Diagnosis not present

## 2016-09-07 DIAGNOSIS — I110 Hypertensive heart disease with heart failure: Secondary | ICD-10-CM | POA: Diagnosis not present

## 2016-09-07 DIAGNOSIS — R69 Illness, unspecified: Secondary | ICD-10-CM | POA: Diagnosis not present

## 2016-09-07 DIAGNOSIS — K219 Gastro-esophageal reflux disease without esophagitis: Secondary | ICD-10-CM | POA: Diagnosis not present

## 2016-09-07 DIAGNOSIS — E119 Type 2 diabetes mellitus without complications: Secondary | ICD-10-CM | POA: Diagnosis not present

## 2016-09-07 DIAGNOSIS — E785 Hyperlipidemia, unspecified: Secondary | ICD-10-CM | POA: Diagnosis not present

## 2016-09-07 DIAGNOSIS — Z95810 Presence of automatic (implantable) cardiac defibrillator: Secondary | ICD-10-CM | POA: Diagnosis not present

## 2016-09-07 DIAGNOSIS — I5022 Chronic systolic (congestive) heart failure: Secondary | ICD-10-CM | POA: Diagnosis not present

## 2016-09-07 DIAGNOSIS — Z7409 Other reduced mobility: Secondary | ICD-10-CM | POA: Diagnosis not present

## 2016-09-07 DIAGNOSIS — R42 Dizziness and giddiness: Secondary | ICD-10-CM | POA: Diagnosis not present

## 2016-09-07 DIAGNOSIS — M2550 Pain in unspecified joint: Secondary | ICD-10-CM | POA: Diagnosis not present

## 2016-09-08 DIAGNOSIS — I110 Hypertensive heart disease with heart failure: Secondary | ICD-10-CM | POA: Diagnosis not present

## 2016-09-08 DIAGNOSIS — K219 Gastro-esophageal reflux disease without esophagitis: Secondary | ICD-10-CM | POA: Diagnosis not present

## 2016-09-08 DIAGNOSIS — R42 Dizziness and giddiness: Secondary | ICD-10-CM | POA: Diagnosis not present

## 2016-09-08 DIAGNOSIS — M2550 Pain in unspecified joint: Secondary | ICD-10-CM | POA: Diagnosis not present

## 2016-09-08 DIAGNOSIS — R69 Illness, unspecified: Secondary | ICD-10-CM | POA: Diagnosis not present

## 2016-09-08 DIAGNOSIS — E119 Type 2 diabetes mellitus without complications: Secondary | ICD-10-CM | POA: Diagnosis not present

## 2016-09-08 DIAGNOSIS — I5022 Chronic systolic (congestive) heart failure: Secondary | ICD-10-CM | POA: Diagnosis not present

## 2016-09-08 DIAGNOSIS — Z95810 Presence of automatic (implantable) cardiac defibrillator: Secondary | ICD-10-CM | POA: Diagnosis not present

## 2016-09-08 DIAGNOSIS — E785 Hyperlipidemia, unspecified: Secondary | ICD-10-CM | POA: Diagnosis not present

## 2016-09-08 DIAGNOSIS — Z7409 Other reduced mobility: Secondary | ICD-10-CM | POA: Diagnosis not present

## 2016-09-12 ENCOUNTER — Ambulatory Visit (INDEPENDENT_AMBULATORY_CARE_PROVIDER_SITE_OTHER): Payer: Medicare HMO

## 2016-09-12 ENCOUNTER — Telehealth: Payer: Self-pay

## 2016-09-12 ENCOUNTER — Ambulatory Visit (INDEPENDENT_AMBULATORY_CARE_PROVIDER_SITE_OTHER): Payer: Medicare HMO | Admitting: *Deleted

## 2016-09-12 ENCOUNTER — Telehealth: Payer: Self-pay | Admitting: *Deleted

## 2016-09-12 DIAGNOSIS — I428 Other cardiomyopathies: Secondary | ICD-10-CM

## 2016-09-12 DIAGNOSIS — M2550 Pain in unspecified joint: Secondary | ICD-10-CM | POA: Diagnosis not present

## 2016-09-12 DIAGNOSIS — R42 Dizziness and giddiness: Secondary | ICD-10-CM | POA: Diagnosis not present

## 2016-09-12 DIAGNOSIS — Z7409 Other reduced mobility: Secondary | ICD-10-CM | POA: Diagnosis not present

## 2016-09-12 DIAGNOSIS — Z95810 Presence of automatic (implantable) cardiac defibrillator: Secondary | ICD-10-CM | POA: Diagnosis not present

## 2016-09-12 DIAGNOSIS — R69 Illness, unspecified: Secondary | ICD-10-CM | POA: Diagnosis not present

## 2016-09-12 DIAGNOSIS — I5022 Chronic systolic (congestive) heart failure: Secondary | ICD-10-CM | POA: Diagnosis not present

## 2016-09-12 DIAGNOSIS — E785 Hyperlipidemia, unspecified: Secondary | ICD-10-CM | POA: Diagnosis not present

## 2016-09-12 DIAGNOSIS — I110 Hypertensive heart disease with heart failure: Secondary | ICD-10-CM | POA: Diagnosis not present

## 2016-09-12 DIAGNOSIS — E119 Type 2 diabetes mellitus without complications: Secondary | ICD-10-CM | POA: Diagnosis not present

## 2016-09-12 DIAGNOSIS — K219 Gastro-esophageal reflux disease without esophagitis: Secondary | ICD-10-CM | POA: Diagnosis not present

## 2016-09-12 NOTE — Telephone Encounter (Signed)
Received call back from daughter, [name] [facility]).  ICM intro given and she agreed to monthly ICM calls.  See today's ICM note for further information and fluid levels

## 2016-09-12 NOTE — Progress Notes (Signed)
EPIC Encounter for ICM Monitoring  Patient Name: Donna [name] is a 76 y.o. female Date: 09/12/2016 Primary Care Physican: Alonza Bogus, MD Primary Cardiologist: Wardell Heath PA Electrophysiologist: Lovena Le Dry Weight:  unknown Bi-V Pacing:  99%       ICM intro given to patient's daughter [name] [facility]) and she agreed to monthly ICM follow up.  Reviewed todays transmission and explained it suggests patient has fluid.  Patient's has some ankle swelling and generally has not felt well in the last week.  She reported on 09/02/2016, patient reported not feeling well and described a burning her chest neck area but then it resolved.  Advised device RN will review today's transmission for any abnormalities around that date.   Patient's last office visit was with Ermalinda Barrios, PA on 08/31/2016.    Thoracic impedance abnormal suggesting fluid accumulation x 10 days since 09/01/2016.  Prescribed and confirmed dosage: Torsemide 20 mg 1 tablet daily and Potassium 20 mEq 2 tablets in am and 1 tablet in pm.    Labs: 08/31/2016 Creatinine 0.85, BUN 21, Potassium 4.1, Sodium 135, EGFR >60 06/08/2016 Creatinine 0.81, BUN 15, Potassium 4.6, Sodium 138, EGFR >60   Recommendations:  Increase Torsemide to 20 mg 1 tablet twice a day x 2 days and after 2nd day return to prior dosage.  Increase Potassium to 20 mEq 2 tablets in am and 2 tablets in pm x 2 days and then return to prior dosage.  She verbalized understanding.    Follow-up plan: ICM clinic phone appointment on 09/20/2016 to recheck fluid levels.  Office appointment scheduled on 10/11/2016 with Dr Harl Bowie.  Copy of ICM check sent to primary cardiologist and device physician.   3 month ICM trend: 09/12/2016   1 Year ICM trend:      [name], RN 09/12/2016 12:05 PM

## 2016-09-12 NOTE — Telephone Encounter (Signed)
LMOVM for Malachy Mood [facility]) requesting call back to the [facility].  No rhythm abnormalities noted from 09/02/16.  Will update patient's daughter when she returns call.

## 2016-09-12 NOTE — Telephone Encounter (Signed)
-----   Message from [name], RN sent at 09/12/2016 12:23 PM EDT ----- Regarding: 91 day transmission review today I spoke with daughter for 1st time for [facility] clinic today.  Daughter reported patient has not been feeling well in the last week.  She reported on 09/02/2016 that patient has burning in chest and neck area and asked if anything showed up on today's transmission.  Advised that I would send to device triage for any changes around that date.  Could you review and if any problems call the daughter?    Thanks, Margarita Grizzle

## 2016-09-12 NOTE — Telephone Encounter (Signed)
Patient referred to [facility] clinic by Debroah Loop, Device RN/Dr Lovena Le.  Attempted ICM intro call to daughter [name] [facility]) and no answer.

## 2016-09-12 NOTE — Progress Notes (Signed)
Remote ICD transmission.   

## 2016-09-12 NOTE — Telephone Encounter (Signed)
Malachy Mood reports that around 09/02/16 x3 days the patient experienced hallucinations and was not herself.  When hallucinations resolved, patient did not remember any of these hallucinations and thought she had been asleep for a few days.  He blood sugar was high and once [place] treated it, the hallucinations resolved.  Malachy Mood states that they are scheduled to see neurology tomorrow.  Patient's PCP is aware of episodes and deferred to neurology.  She denies any changes in medications.  [employer] that I reviewed transmission and it does not show any alerts or abnormalities.  Advised that if patient was having chest/neck discomfort due to a blockage, we would not be able to tell based on her device.  Advised that in this case, it would be best to call 911.  Cheryl verbalizes understanding and denies additional questions at this time.  She is appreciative of assistance.

## 2016-09-13 ENCOUNTER — Ambulatory Visit (INDEPENDENT_AMBULATORY_CARE_PROVIDER_SITE_OTHER): Payer: Medicare HMO | Admitting: Neurology

## 2016-09-13 ENCOUNTER — Encounter: Payer: Self-pay | Admitting: Neurology

## 2016-09-13 VITALS — BP 118/71 | HR 62 | Ht 62.0 in | Wt 113.6 lb

## 2016-09-13 DIAGNOSIS — R410 Disorientation, unspecified: Secondary | ICD-10-CM | POA: Diagnosis not present

## 2016-09-13 NOTE — Progress Notes (Signed)
[facility]    Provider:  Dr Jaynee Eagles Referring Provider: Sinda Du, MD Primary Care Physician:  Alonza Bogus, MD  CC:  Memory changes  Interval history 09/13/2016: Patient is here for memory changes in the setting of hygromas. She was evaluated at [facility] for surgical intervention and monitoring was suggested. Last MMSE was 14/30 and today 15/30. She had an episode of confusion and she doesn't remember several days this month, she was seeing people this was 3-4 weeks ago. Her blood sugar was extremely elevated at this time. It was over 200. She also has some increased fluid in the lungs due to CHF. She is back to baseline.  HPI:  Donna [name] is a 76 y.o. female here as a referral from Dr. Luan Pulling for confusion. Past medical history of  dizziness, hyperlipidemia, hypothyroidism, type 2 diabetes, cardiac arrhythmia, chronic systolic congestive heart failure, osteoarthritis, paroxysmal ventricular tachycardia, atrial fibrillation. Here with daughter who provides much information. Patient says she feels bad for several months, since November. They started noticing worsening memory issues in November, nothing significant before November. Since November they have noticed she can't remember things. Not taking her medication appropriately. Seizures in the family but no hx of Alzheimers or dementia. Mother was 62 and father was 45 when they died and no problems with memory loss.  Grandson lives with patient. She doesn't drive anymore. No accidents in the home. She doesn't cook anymore, grandson does it. They don't let her cook. Personal hygiene is fine, no reminders needed, dresses herself. Patient has needed to help with bills, bills were not being paid. Worsening balance and falls. Significant change since November, prior to this was independent, driving, paying her own bills, cooking and cleaning.   Reviewed notes, labs and imaging from outside physicians, which  showed:  She is having problems with memory. CT of the scan was ordered because she can't do an MRI. No chest pain. She is heart failure has been in the hospital for the last several months. She has hypertension and her blood pressure is relatively low. She has a history of ventricular tachycardia and she has a defibrillator in place. Her thyroid and lipids were "okay" a month ago per primary care notes.  CMP 06/08/2016 with elevated glucose otherwise unremarkable. CBC with anemia 10.2 hemoglobin. TSH 4.8.  Personally reviewed imaging and agree with the following:  FINDINGS: BRAIN: The ventricles are normal for age. No intraparenchymal hemorrhage, mass effect nor midline shift. Patchy supratentorial white matter hypodensities less than expected for patient's age, though non-specific are most compatible with chronic small vessel ischemic disease. No acute large vascular territory infarcts. Slightly larger 10 mm bilateral frontal low-density extra-axial fluid collections with mild mass effect on the subjacent sulci. Basal cisterns are patent.  VASCULAR: Moderate calcific atherosclerosis of the carotid siphons.  SKULL: No skull fracture. Subcentimeter bony excrescence from inner table LEFT frontal calvarium compatible with meningioma without mass effect. No significant scalp soft tissue swelling.  SINUSES/ORBITS: The mastoid air-cells and included paranasal sinuses are well-aerated.The included ocular globes and orbital contents are non-suspicious.  OTHER: None.  IMPRESSION: Slightly larger 10 mm bilateral frontal presumed hygromas with mild mass effect, no midline shift.  Subcentimeter LEFT frontal probable meningioma.  Review of Systems: Patient complains of symptoms per HPI as well as the following symptoms: Blurred vision, cough, swelling of legs, feeling cold, memory loss, confusion, slurred speech, dizziness, aching muscles, decreased energy, runny nose Pertinent  negatives per HPI. All others negative.   Social  History   Social History  . Marital status: Widowed    Spouse name: N/A  . Number of children: 4  . Years of education: 10   Occupational History  . Retired Retired   Social History Main Topics  . Smoking status: Never Smoker  . Smokeless tobacco: Never Used  . Alcohol use No  . Drug use: No  . Sexual activity: No   Other Topics Concern  . Not on file   Social History Narrative   Lives in [place] with her grandson   Physically active   Right-handed   Caffeine: none    Family History  Problem Relation Age of Onset  . Diabetes Mother   . Kidney disease Mother   . Heart failure Mother   . Stomach cancer Father   . Hypertension Father   . Diabetes Sister   . Seizures Sister   . Heart attack Brother   . Seizures Brother   . Dementia Neg Hx     Past Medical History:  Diagnosis Date  . AICD (automatic cardioverter/defibrillator) present   . Arthritis    "fingers" (11/18/2014)  . Atrial flutter (HCC)    Versus ventricular tachycardia; treated with amiodarone  . Cardiomyopathy, nonischemic ([place])    Nl cors in [place]; CHF in 12/97; EF of 40% in 5/01 and 7/03, 25% in 9/04 and 4/08.  systolic murmur without valvular abnormalities by echo; refused automatic implantable cardiac defibrillator  . Cerebrovascular disease    Duplex study in 12/2006 shows atherosclerosis without focal stenosis  . CHF (congestive heart failure) ([place])   . Diabetes mellitus, type II ([place])    No insulin  . Gastroesophageal reflux disease   . Hyperlipidemia   . Hypertension   . Left bundle branch block   . Ventricular tachycardia ([place])    a. PMH it lists "atrial flutter versus ventricular tachycardia treated with amiodarone" - details of this are unclear. Notes from back to 2006 indicate she has been maintained on amiodarone but do not describe further indication. Patient's sister reports pt was told she had skipped beats that could  cause her to drop dead - Dr. Lattie Haw put her on amiodarone and recommended a defibrillator.    Past Surgical History:  Procedure Laterality Date  . ABDOMINAL HYSTERECTOMY  2006  . BI-VENTRICULAR IMPLANTABLE CARDIOVERTER DEFIBRILLATOR  (CRT-D)  11/18/2014  . COLONOSCOPY  2008  . EP IMPLANTABLE DEVICE N/A 11/18/2014   Procedure: BiV ICD Insertion CRT-D;  Surgeon: Evans Lance, MD;  Location: [facility];  Service: Cardiovascular;  Laterality: N/A;  . HERNIA REPAIR Right   . TONSILLECTOMY      Current Outpatient Prescriptions  Medication Sig Dispense Refill  . acetaminophen (TYLENOL) 500 MG tablet Take 500 mg by mouth every 6 (six) hours as needed.    Marland Kitchen amiodarone (PACERONE) 200 MG tablet Take 100 mg by mouth daily at 12 noon.    Marland Kitchen aspirin 81 MG tablet Take 81 mg by mouth daily.    Marland Kitchen atorvastatin (LIPITOR) 20 MG tablet Take 1 tablet (20 mg total) by mouth daily at 6 PM. 30 tablet 12  . carvedilol (COREG) 3.125 MG tablet Take 1 tablet (3.125 mg total) by mouth 2 (two) times daily. 60 tablet 11  . insulin detemir (LEVEMIR) 100 UNIT/ML injection Inject 12 Units into the skin at bedtime.    . IRON PO Take 1 tablet by mouth daily.    Marland Kitchen levothyroxine (SYNTHROID, LEVOTHROID) 75 MCG tablet Take 75  mcg by mouth daily before breakfast.    . losartan (COZAAR) 25 MG tablet Take 0.5 tablets (12.5 mg total) by mouth daily. 90 tablet 3  . Omega-3 Fatty Acids (FISH OIL PO) Take by mouth 3 (three) times daily.    Marland Kitchen omeprazole (PRILOSEC) 20 MG capsule Take 20 mg by mouth 2 (two) times daily.      . potassium chloride SA (KLOR-CON M20) 20 MEQ tablet Take 2 Tablets In The Morning And Take 1 Tablet In The Evening 90 tablet 3  . sitaGLIPtin (JANUVIA) 100 MG tablet Take 100 mg by mouth daily.    Marland Kitchen torsemide (DEMADEX) 20 MG tablet Take 1 tablet (20 mg total) by mouth daily. 180 tablet 3  . UNIFINE PENTIPS 32G X 4 MM MISC USE TWICE A DAY TO TAKE INSULIN. 100 each 2   No current facility-administered  medications for this visit.     Allergies as of 09/13/2016 - Review Complete 09/13/2016  Allergen Reaction Noted  . Codeine Other (See Comments) 05/28/2009  . Decongestant [pseudoephedrine hcl er] Other (See Comments) 06/14/2012    Vitals: BP 118/71   Pulse 62   Ht 5\' 2"  (1.575 m)   Wt 113 lb 9.6 oz (51.5 kg)   BMI 20.78 kg/m  Last Weight:  Wt Readings from Last 1 Encounters:  09/13/16 113 lb 9.6 oz (51.5 kg)   Last Height:   Ht Readings from Last 1 Encounters:  09/13/16 5\' 2"  (1.575 m)     Neuro: Detailed Neurologic Exam  Speech:    Speech is normal; fluent and spontaneous with normal comprehension.  Cognition:  MMSE - Mini Mental State Exam 07/25/2016 07/25/2016  Orientation to time 2 2  Orientation to Place 3 3  Registration 3 3  Attention/ Calculation 0 0  Recall 1 1  Language- name 2 objects 2 1  Language- repeat 1 1  Language- follow 3 step command 2 2  Language- read & follow direction 1 1  Write a sentence 0 0  Copy design 0 0  Total score 15 14      The patient is oriented to person, place, and time;     recent and remote memory impaired;     language fluent;     Impaired attention, concentration, fund of knowledge Cranial Nerves:    The pupils are equal, round, and reactive to light. The fundi are normal and spontaneous venous pulsations are present. Visual fields are full to finger confrontation. Impaired upgaze otherwise extraocular movements are intact. Trigeminal sensation is intact and the muscles of mastication are normal. Right NL flattenting. The palate elevates in the midline. Hearing intact. Voice is normal. Shoulder shrug is normal. The tongue has normal motion without fasciculations.   Coordination:    No dysmetria but difficulty performing due to cognition  Gait:    Imbalance, cant walk heel toes or tandem. Narrow gait, mildly shuffling  Motor Observation:    no involuntary movements noted. Tone:    Increased in all extremities.     Posture:    Posture is slightly stooped    Strength:    Strength is 4/V in the proximal upper and lower limbs.      Sensation: intact to LT     Reflex Exam:  DTR's:    Deep tendon reflexes in the upper and lower extremities are brisk bilaterally.   Toes:    The toes are downgoing bilaterally.   Clonus:    Clonus is absent.  Right NL  flattenting   Assessment/Plan:  76 year old female with likely dementia. She has bilateral hygromas which are chronic.Marland Kitchen She is having progressive memory changes, imbalance, falls. Exam shows imbalance with inability to walk on heels, toes, narrow and slighly shuffling gait, increased tone. CT shows increased hygromas bilaterally 62mm. This is somewhat chronic was 35mm in 2012 however given increase of hygromas, recent memory and gait changes, and mass effect on the frontal lobes referred to [facility] and they suggested monitoring.    - Repeat CT of the head because of recent hallucinations likely due to illness - Will email Dr. Harl Bowie in cardiology and ensure Aricept is fine considering her cardiac problems - Fall precautions, she fell on easter. She has PT coming to the home. - Repeat CT of the head, she will have it completed at [facility] and then at next appointment [facility]  Cc: Dr. Peter Garter, MD  [facility] [address] [place], [place] [address]  Phone [phone] Fax [phone]  A total of 25 minutes was spent face-to-face with this patient. Over half this time was spent on counseling patient on the dementia diagnosis and different diagnostic and therapeutic options available.

## 2016-09-13 NOTE — Patient Instructions (Signed)
Remember to drink plenty of fluid, eat healthy meals and do not skip any meals. Try to eat protein with a every meal and eat a healthy snack such as fruit or nuts in between meals. Try to keep a regular sleep-wake schedule and try to exercise daily, particularly in the form of walking, 20-30 minutes a day, if you can.   As far as your medications are concerned, I would like to suggest: Aricept(Donepezil)  As far as diagnostic testing: CT head  I would like to see you back in 6 months, sooner if we need to. Please call us with any interim questions, concerns, problems, updates or refill requests.   Our phone number is [phone]. We also have an after hours call service for urgent matters and there is a physician on-call for urgent questions. For any emergencies you know to call 911 or go to the nearest emergency room  Donepezil tablets What is this medicine? DONEPEZIL (doe NEP e zil) is used to treat mild to moderate dementia caused by Alzheimer's disease. This medicine may be used for other purposes; ask your health care provider or pharmacist if you have questions. COMMON BRAND NAME(S): Aricept What should I tell my health care provider before I take this medicine? They need to know if you have any of these conditions: -asthma or other lung disease -difficulty passing urine -head injury -heart disease -history of irregular heartbeat -liver disease -seizures (convulsions) -stomach or intestinal disease, ulcers or stomach bleeding -an unusual or allergic reaction to donepezil, other medicines, foods, dyes, or preservatives -pregnant or trying to get pregnant -breast-feeding How should I use this medicine? Take this medicine by mouth with a glass of water. Follow the directions on the prescription label. You may take this medicine with or without food. Take this medicine at regular intervals. This medicine is usually taken before bedtime. Do not take it more often than directed.  Continue to take your medicine even if you feel better. Do not stop taking except on your doctor's advice. If you are taking the 23 mg donepezil tablet, swallow it whole; do not cut, crush, or chew it. Talk to your pediatrician regarding the use of this medicine in children. Special care may be needed. Overdosage: If you think you have taken too much of this medicine contact a poison control center or emergency room at once. NOTE: This medicine is only for you. Do not share this medicine with others. What if I miss a dose? If you miss a dose, take it as soon as you can. If it is almost time for your next dose, take only that dose, do not take double or extra doses. What may interact with this medicine? Do not take this medicine with any of the following medications: -certain medicines for fungal infections like itraconazole, fluconazole, posaconazole, and voriconazole -cisapride -dextromethorphan; quinidine -dofetilide -dronedarone -pimozide -quinidine -thioridazine -ziprasidone This medicine may also interact with the following medications: -antihistamines for allergy, cough and cold -atropine -bethanechol -carbamazepine -certain medicines for bladder problems like oxybutynin, tolterodine -certain medicines for Parkinson's disease like benztropine, trihexyphenidyl -certain medicines for stomach problems like dicyclomine, hyoscyamine -certain medicines for travel sickness like scopolamine -dexamethasone -ipratropium -NSAIDs, medicines for pain and inflammation, like ibuprofen or naproxen -other medicines for Alzheimer's disease -other medicines that prolong the QT interval (cause an abnormal heart rhythm) -phenobarbital -phenytoin -rifampin, rifabutin or rifapentine This list may not describe all possible interactions. Give your health care provider a list of all the medicines, herbs, non-prescription drugs,  or dietary supplements you use. Also tell them if you smoke, drink  alcohol, or use illegal drugs. Some items may interact with your medicine. What should I watch for while using this medicine? Visit your doctor or health care professional for regular checks on your progress. Check with your doctor or health care professional if your symptoms do not get better or if they get worse. You may get drowsy or dizzy. Do not drive, use machinery, or do anything that needs mental alertness until you know how this drug affects you. What side effects may I notice from receiving this medicine? Side effects that you should report to your doctor or health care professional as soon as possible: -allergic reactions like skin rash, itching or hives, swelling of the face, lips, or tongue -feeling faint or lightheaded, falls -loss of bladder control -seizures -signs and symptoms of a dangerous change in heartbeat or heart rhythm like chest pain; dizziness; fast or irregular heartbeat; palpitations; feeling faint or lightheaded, falls; breathing problems -signs and symptoms of infection like fever or chills; cough; sore throat; pain or trouble passing urine -signs and symptoms of liver injury like dark yellow or brown urine; general ill feeling or flu-like symptoms; light-colored stools; loss of appetite; nausea; right upper belly pain; unusually weak or tired; yellowing of the eyes or skin -slow heartbeat or palpitations -unusual bleeding or bruising -vomiting Side effects that usually do not require medical attention (report to your doctor or health care professional if they continue or are bothersome): -diarrhea, especially when starting treatment -headache -loss of appetite -muscle cramps -nausea -stomach upset This list may not describe all possible side effects. Call your doctor for medical advice about side effects. You may report side effects to FDA at 1-800-FDA-1088. Where should I keep my medicine? Keep out of reach of children. Store at room temperature between 15  and 30 degrees C (59 and 86 degrees F). Throw away any unused medicine after the expiration date. NOTE: This sheet is a summary. It may not cover all possible information. If you have questions about this medicine, talk to your doctor, pharmacist, or health care provider.  2018 Elsevier/Gold Standard (2015-11-09 21:00:42)

## 2016-09-14 ENCOUNTER — Encounter: Payer: Self-pay | Admitting: Cardiology

## 2016-09-16 DIAGNOSIS — I5022 Chronic systolic (congestive) heart failure: Secondary | ICD-10-CM | POA: Diagnosis not present

## 2016-09-16 DIAGNOSIS — Z95810 Presence of automatic (implantable) cardiac defibrillator: Secondary | ICD-10-CM | POA: Diagnosis not present

## 2016-09-16 DIAGNOSIS — I110 Hypertensive heart disease with heart failure: Secondary | ICD-10-CM | POA: Diagnosis not present

## 2016-09-16 DIAGNOSIS — E119 Type 2 diabetes mellitus without complications: Secondary | ICD-10-CM | POA: Diagnosis not present

## 2016-09-16 DIAGNOSIS — Z7409 Other reduced mobility: Secondary | ICD-10-CM | POA: Diagnosis not present

## 2016-09-16 DIAGNOSIS — R69 Illness, unspecified: Secondary | ICD-10-CM | POA: Diagnosis not present

## 2016-09-16 DIAGNOSIS — K219 Gastro-esophageal reflux disease without esophagitis: Secondary | ICD-10-CM | POA: Diagnosis not present

## 2016-09-16 DIAGNOSIS — E785 Hyperlipidemia, unspecified: Secondary | ICD-10-CM | POA: Diagnosis not present

## 2016-09-16 DIAGNOSIS — M2550 Pain in unspecified joint: Secondary | ICD-10-CM | POA: Diagnosis not present

## 2016-09-16 DIAGNOSIS — R42 Dizziness and giddiness: Secondary | ICD-10-CM | POA: Diagnosis not present

## 2016-09-16 LAB — CUP PACEART REMOTE DEVICE CHECK
Battery Remaining Longevity: 62 mo
Brady Statistic AP VP Percent: 77 %
Date Time Interrogation Session: 20180409060016
HIGH POWER IMPEDANCE MEASURED VALUE: 48 Ohm
HighPow Impedance: 48 Ohm
Implantable Lead Implant Date: 20160614
Implantable Lead Implant Date: 20160614
Implantable Lead Implant Date: 20160614
Implantable Lead Location: 753858
Implantable Lead Model: 7122
Implantable Pulse Generator Implant Date: 20160614
Lead Channel Pacing Threshold Pulse Width: 0.5 ms
Lead Channel Pacing Threshold Pulse Width: 0.5 ms
Lead Channel Sensing Intrinsic Amplitude: 0.8 mV
Lead Channel Setting Pacing Amplitude: 2.5 V
MDC IDC LEAD LOCATION: 753859
MDC IDC LEAD LOCATION: 753860
MDC IDC MSMT BATTERY REMAINING PERCENTAGE: 75 %
MDC IDC MSMT BATTERY VOLTAGE: 2.96 V
MDC IDC MSMT LEADCHNL LV IMPEDANCE VALUE: 960 Ohm
MDC IDC MSMT LEADCHNL LV PACING THRESHOLD AMPLITUDE: 1.25 V
MDC IDC MSMT LEADCHNL RA IMPEDANCE VALUE: 440 Ohm
MDC IDC MSMT LEADCHNL RA PACING THRESHOLD AMPLITUDE: 0.5 V
MDC IDC MSMT LEADCHNL RA PACING THRESHOLD PULSEWIDTH: 0.5 ms
MDC IDC MSMT LEADCHNL RV IMPEDANCE VALUE: 390 Ohm
MDC IDC MSMT LEADCHNL RV PACING THRESHOLD AMPLITUDE: 0.5 V
MDC IDC MSMT LEADCHNL RV SENSING INTR AMPL: 11.7 mV
MDC IDC SET LEADCHNL LV PACING AMPLITUDE: 2.25 V
MDC IDC SET LEADCHNL LV PACING PULSEWIDTH: 0.5 ms
MDC IDC SET LEADCHNL RA PACING AMPLITUDE: 2 V
MDC IDC SET LEADCHNL RV PACING PULSEWIDTH: 0.5 ms
MDC IDC SET LEADCHNL RV SENSING SENSITIVITY: 0.5 mV
MDC IDC STAT BRADY AP VS PERCENT: 1 %
MDC IDC STAT BRADY AS VP PERCENT: 21 %
MDC IDC STAT BRADY AS VS PERCENT: 1 %
MDC IDC STAT BRADY RA PERCENT PACED: 78 %
Pulse Gen Serial Number: 1210376

## 2016-09-20 ENCOUNTER — Emergency Department (HOSPITAL_COMMUNITY): Payer: Medicare HMO

## 2016-09-20 ENCOUNTER — Telehealth: Payer: Self-pay | Admitting: Neurology

## 2016-09-20 ENCOUNTER — Inpatient Hospital Stay (HOSPITAL_COMMUNITY)
Admission: EM | Admit: 2016-09-20 | Discharge: 2016-09-24 | DRG: 064 | Disposition: A | Discharge status: Home Health | Payer: Medicare HMO | Attending: Pulmonary Disease | Admitting: Pulmonary Disease

## 2016-09-20 ENCOUNTER — Encounter (HOSPITAL_COMMUNITY): Payer: Self-pay

## 2016-09-20 ENCOUNTER — Ambulatory Visit (INDEPENDENT_AMBULATORY_CARE_PROVIDER_SITE_OTHER): Payer: Self-pay

## 2016-09-20 ENCOUNTER — Telehealth: Payer: Self-pay

## 2016-09-20 DIAGNOSIS — D329 Benign neoplasm of meninges, unspecified: Secondary | ICD-10-CM | POA: Diagnosis present

## 2016-09-20 DIAGNOSIS — Z90710 Acquired absence of both cervix and uterus: Secondary | ICD-10-CM | POA: Diagnosis not present

## 2016-09-20 DIAGNOSIS — I110 Hypertensive heart disease with heart failure: Secondary | ICD-10-CM | POA: Diagnosis not present

## 2016-09-20 DIAGNOSIS — I509 Heart failure, unspecified: Secondary | ICD-10-CM | POA: Diagnosis not present

## 2016-09-20 DIAGNOSIS — I10 Essential (primary) hypertension: Secondary | ICD-10-CM | POA: Diagnosis present

## 2016-09-20 DIAGNOSIS — F0390 Unspecified dementia without behavioral disturbance: Secondary | ICD-10-CM | POA: Diagnosis present

## 2016-09-20 DIAGNOSIS — K219 Gastro-esophageal reflux disease without esophagitis: Secondary | ICD-10-CM | POA: Diagnosis present

## 2016-09-20 DIAGNOSIS — I5023 Acute on chronic systolic (congestive) heart failure: Secondary | ICD-10-CM | POA: Diagnosis not present

## 2016-09-20 DIAGNOSIS — R0602 Shortness of breath: Secondary | ICD-10-CM | POA: Diagnosis not present

## 2016-09-20 DIAGNOSIS — R0600 Dyspnea, unspecified: Secondary | ICD-10-CM | POA: Diagnosis not present

## 2016-09-20 DIAGNOSIS — R0789 Other chest pain: Secondary | ICD-10-CM | POA: Diagnosis not present

## 2016-09-20 DIAGNOSIS — I679 Cerebrovascular disease, unspecified: Secondary | ICD-10-CM | POA: Diagnosis not present

## 2016-09-20 DIAGNOSIS — E869 Volume depletion, unspecified: Secondary | ICD-10-CM | POA: Diagnosis not present

## 2016-09-20 DIAGNOSIS — Z794 Long term (current) use of insulin: Secondary | ICD-10-CM

## 2016-09-20 DIAGNOSIS — R471 Dysarthria and anarthria: Secondary | ICD-10-CM | POA: Diagnosis present

## 2016-09-20 DIAGNOSIS — Z8249 Family history of ischemic heart disease and other diseases of the circulatory system: Secondary | ICD-10-CM

## 2016-09-20 DIAGNOSIS — D181 Lymphangioma, any site: Secondary | ICD-10-CM | POA: Diagnosis not present

## 2016-09-20 DIAGNOSIS — I639 Cerebral infarction, unspecified: Secondary | ICD-10-CM | POA: Diagnosis not present

## 2016-09-20 DIAGNOSIS — R42 Dizziness and giddiness: Secondary | ICD-10-CM

## 2016-09-20 DIAGNOSIS — I447 Left bundle-branch block, unspecified: Secondary | ICD-10-CM | POA: Diagnosis present

## 2016-09-20 DIAGNOSIS — I5022 Chronic systolic (congestive) heart failure: Secondary | ICD-10-CM

## 2016-09-20 DIAGNOSIS — R569 Unspecified convulsions: Secondary | ICD-10-CM | POA: Diagnosis not present

## 2016-09-20 DIAGNOSIS — Z888 Allergy status to other drugs, medicaments and biological substances status: Secondary | ICD-10-CM

## 2016-09-20 DIAGNOSIS — I429 Cardiomyopathy, unspecified: Secondary | ICD-10-CM | POA: Diagnosis not present

## 2016-09-20 DIAGNOSIS — Z800 Family history of malignant neoplasm of digestive organs: Secondary | ICD-10-CM

## 2016-09-20 DIAGNOSIS — Z95810 Presence of automatic (implantable) cardiac defibrillator: Secondary | ICD-10-CM | POA: Diagnosis not present

## 2016-09-20 DIAGNOSIS — R269 Unspecified abnormalities of gait and mobility: Secondary | ICD-10-CM | POA: Diagnosis not present

## 2016-09-20 DIAGNOSIS — Z841 Family history of disorders of kidney and ureter: Secondary | ICD-10-CM

## 2016-09-20 DIAGNOSIS — I313 Pericardial effusion (noninflammatory): Secondary | ICD-10-CM | POA: Diagnosis not present

## 2016-09-20 DIAGNOSIS — Z833 Family history of diabetes mellitus: Secondary | ICD-10-CM

## 2016-09-20 DIAGNOSIS — E1151 Type 2 diabetes mellitus with diabetic peripheral angiopathy without gangrene: Secondary | ICD-10-CM | POA: Diagnosis not present

## 2016-09-20 DIAGNOSIS — R2689 Other abnormalities of gait and mobility: Secondary | ICD-10-CM | POA: Diagnosis not present

## 2016-09-20 DIAGNOSIS — R29701 NIHSS score 1: Secondary | ICD-10-CM | POA: Diagnosis present

## 2016-09-20 DIAGNOSIS — R4189 Other symptoms and signs involving cognitive functions and awareness: Secondary | ICD-10-CM | POA: Diagnosis not present

## 2016-09-20 DIAGNOSIS — E119 Type 2 diabetes mellitus without complications: Secondary | ICD-10-CM

## 2016-09-20 DIAGNOSIS — M19049 Primary osteoarthritis, unspecified hand: Secondary | ICD-10-CM | POA: Diagnosis present

## 2016-09-20 DIAGNOSIS — Z885 Allergy status to narcotic agent status: Secondary | ICD-10-CM | POA: Diagnosis not present

## 2016-09-20 DIAGNOSIS — E785 Hyperlipidemia, unspecified: Secondary | ICD-10-CM | POA: Diagnosis present

## 2016-09-20 DIAGNOSIS — I428 Other cardiomyopathies: Secondary | ICD-10-CM

## 2016-09-20 DIAGNOSIS — E039 Hypothyroidism, unspecified: Secondary | ICD-10-CM | POA: Diagnosis present

## 2016-09-20 DIAGNOSIS — Z7982 Long term (current) use of aspirin: Secondary | ICD-10-CM | POA: Diagnosis not present

## 2016-09-20 DIAGNOSIS — J8410 Pulmonary fibrosis, unspecified: Secondary | ICD-10-CM

## 2016-09-20 DIAGNOSIS — I63233 Cerebral infarction due to unspecified occlusion or stenosis of bilateral carotid arteries: Secondary | ICD-10-CM | POA: Diagnosis not present

## 2016-09-20 DIAGNOSIS — R69 Illness, unspecified: Secondary | ICD-10-CM | POA: Diagnosis not present

## 2016-09-20 DIAGNOSIS — I6789 Other cerebrovascular disease: Secondary | ICD-10-CM | POA: Diagnosis not present

## 2016-09-20 DIAGNOSIS — Z79899 Other long term (current) drug therapy: Secondary | ICD-10-CM

## 2016-09-20 DIAGNOSIS — I69351 Hemiplegia and hemiparesis following cerebral infarction affecting right dominant side: Secondary | ICD-10-CM | POA: Diagnosis not present

## 2016-09-20 LAB — BASIC METABOLIC PANEL
Anion gap: 9 (ref 5–15)
BUN: 17 mg/dL (ref 6–20)
CO2: 31 mmol/L (ref 22–32)
CREATININE: 0.72 mg/dL (ref 0.44–1.00)
Calcium: 9.4 mg/dL (ref 8.9–10.3)
Chloride: 98 mmol/L — ABNORMAL LOW (ref 101–111)
Glucose, Bld: 96 mg/dL (ref 65–99)
Potassium: 4 mmol/L (ref 3.5–5.1)
SODIUM: 138 mmol/L (ref 135–145)

## 2016-09-20 LAB — URINALYSIS, ROUTINE W REFLEX MICROSCOPIC
Bilirubin Urine: NEGATIVE
Glucose, UA: NEGATIVE mg/dL
Hgb urine dipstick: NEGATIVE
Ketones, ur: NEGATIVE mg/dL
Leukocytes, UA: NEGATIVE
Nitrite: NEGATIVE
Protein, ur: NEGATIVE mg/dL
Specific Gravity, Urine: 1.005 (ref 1.005–1.030)
pH: 7 (ref 5.0–8.0)

## 2016-09-20 LAB — CBC
HCT: 40.6 % (ref 36.0–46.0)
Hemoglobin: 12.9 g/dL (ref 12.0–15.0)
MCH: 31.8 pg (ref 26.0–34.0)
MCHC: 31.8 g/dL (ref 30.0–36.0)
MCV: 100 fL (ref 78.0–100.0)
Platelets: 172 K/uL (ref 150–400)
RBC: 4.06 MIL/uL (ref 3.87–5.11)
RDW: 15.1 % (ref 11.5–15.5)
WBC: 6.2 K/uL (ref 4.0–10.5)

## 2016-09-20 LAB — GLUCOSE, CAPILLARY
GLUCOSE-CAPILLARY: 102 mg/dL — AB (ref 65–99)
Glucose-Capillary: 157 mg/dL — ABNORMAL HIGH (ref 65–99)

## 2016-09-20 LAB — MAGNESIUM: Magnesium: 2.3 mg/dL (ref 1.7–2.4)

## 2016-09-20 LAB — CBG MONITORING, ED: Glucose-Capillary: 74 mg/dL (ref 65–99)

## 2016-09-20 MED ORDER — ATORVASTATIN CALCIUM 20 MG PO TABS
20.0000 mg | ORAL_TABLET | Freq: Every day | ORAL | Status: DC
  Administered 2016-09-20 – 2016-09-24 (×5): 20 mg via ORAL
  Filled 2016-09-20 (×5): qty 1

## 2016-09-20 MED ORDER — STROKE: EARLY STAGES OF RECOVERY BOOK
Freq: Once | Status: AC
  Administered 2016-09-21: 10:00:00
  Filled 2016-09-20: qty 1

## 2016-09-20 MED ORDER — LEVOTHYROXINE SODIUM 75 MCG PO TABS
75.0000 ug | ORAL_TABLET | Freq: Every day | ORAL | Status: DC
  Administered 2016-09-21 – 2016-09-24 (×4): 75 ug via ORAL
  Filled 2016-09-20 (×4): qty 1

## 2016-09-20 MED ORDER — LINAGLIPTIN 5 MG PO TABS
5.0000 mg | ORAL_TABLET | Freq: Every day | ORAL | Status: DC
  Administered 2016-09-21 – 2016-09-24 (×4): 5 mg via ORAL
  Filled 2016-09-20 (×4): qty 1

## 2016-09-20 MED ORDER — FUROSEMIDE 10 MG/ML IJ SOLN
40.0000 mg | Freq: Once | INTRAMUSCULAR | Status: AC
  Administered 2016-09-20: 40 mg via INTRAVENOUS
  Filled 2016-09-20: qty 4

## 2016-09-20 MED ORDER — HEPARIN SODIUM (PORCINE) 5000 UNIT/ML IJ SOLN
5000.0000 [IU] | Freq: Three times a day (TID) | INTRAMUSCULAR | Status: DC
  Administered 2016-09-20 – 2016-09-23 (×11): 5000 [IU] via SUBCUTANEOUS
  Filled 2016-09-20 (×11): qty 1

## 2016-09-20 MED ORDER — ASPIRIN EC 81 MG PO TBEC
81.0000 mg | DELAYED_RELEASE_TABLET | Freq: Every day | ORAL | Status: DC
  Administered 2016-09-21 – 2016-09-23 (×3): 81 mg via ORAL
  Filled 2016-09-20 (×3): qty 1

## 2016-09-20 MED ORDER — CLOPIDOGREL BISULFATE 75 MG PO TABS
75.0000 mg | ORAL_TABLET | Freq: Every day | ORAL | Status: DC
  Administered 2016-09-20 – 2016-09-24 (×5): 75 mg via ORAL
  Filled 2016-09-20 (×5): qty 1

## 2016-09-20 MED ORDER — OMEGA-3-ACID ETHYL ESTERS 1 G PO CAPS
1.0000 g | ORAL_CAPSULE | Freq: Every day | ORAL | Status: DC
  Administered 2016-09-20 – 2016-09-24 (×5): 1 g via ORAL
  Filled 2016-09-20 (×5): qty 1

## 2016-09-20 MED ORDER — CARVEDILOL 3.125 MG PO TABS
3.1250 mg | ORAL_TABLET | Freq: Two times a day (BID) | ORAL | Status: DC
  Administered 2016-09-20 – 2016-09-22 (×4): 3.125 mg via ORAL
  Filled 2016-09-20 (×4): qty 1

## 2016-09-20 MED ORDER — TORSEMIDE 20 MG PO TABS
20.0000 mg | ORAL_TABLET | Freq: Every day | ORAL | Status: DC
  Administered 2016-09-21: 20 mg via ORAL
  Filled 2016-09-20: qty 1

## 2016-09-20 MED ORDER — INSULIN ASPART 100 UNIT/ML ~~LOC~~ SOLN
0.0000 [IU] | Freq: Three times a day (TID) | SUBCUTANEOUS | Status: DC
  Administered 2016-09-21 (×2): 2 [IU] via SUBCUTANEOUS
  Administered 2016-09-22 (×3): 3 [IU] via SUBCUTANEOUS
  Administered 2016-09-23: 2 [IU] via SUBCUTANEOUS
  Administered 2016-09-23 (×2): 3 [IU] via SUBCUTANEOUS

## 2016-09-20 MED ORDER — INSULIN ASPART 100 UNIT/ML ~~LOC~~ SOLN
0.0000 [IU] | Freq: Every day | SUBCUTANEOUS | Status: DC
  Administered 2016-09-23: 2 [IU] via SUBCUTANEOUS

## 2016-09-20 MED ORDER — LOSARTAN POTASSIUM 25 MG PO TABS
12.5000 mg | ORAL_TABLET | Freq: Every day | ORAL | Status: DC
  Administered 2016-09-20: 12.5 mg via ORAL
  Administered 2016-09-21: 12:00:00 via ORAL
  Administered 2016-09-22 – 2016-09-24 (×3): 12.5 mg via ORAL
  Filled 2016-09-20 (×7): qty 0.5

## 2016-09-20 MED ORDER — AMIODARONE HCL 200 MG PO TABS
100.0000 mg | ORAL_TABLET | Freq: Every day | ORAL | Status: DC
  Administered 2016-09-21 – 2016-09-23 (×3): 100 mg via ORAL
  Filled 2016-09-20 (×4): qty 1

## 2016-09-20 MED ORDER — SENNOSIDES-DOCUSATE SODIUM 8.6-50 MG PO TABS: 1.0000 | ORAL_TABLET | Freq: Every evening | ORAL | Status: DC | PRN

## 2016-09-20 MED ORDER — ACETAMINOPHEN 500 MG PO TABS: 500.0000 mg | ORAL_TABLET | Freq: Four times a day (QID) | ORAL | Status: DC | PRN

## 2016-09-20 NOTE — Progress Notes (Signed)
Daughter returned call.  Patient currently in ER and being admitted.  She reported the physicians think she has had a stroke and she also had some fluid accumulation.  She is unsure when she will be discharged.  Advised will schedule a transmission for 10/04/2016 to recheck fluid levels.  Encouraged to call if patient has any problems with fluid prior to next transmission.

## 2016-09-20 NOTE — ED Notes (Signed)
RT at bedside.

## 2016-09-20 NOTE — ED Notes (Signed)
Was called into room by patient's family member stating patient is wheezing. Upon auscultation, patient has expiratory wheezing and rhonchi bilaterally. Patient had labored breathing with respirations 26 bpm. Patient states "I'm trying to cough but I'm not able to get anything up." Dr Reather Converse aware.

## 2016-09-20 NOTE — ED Provider Notes (Signed)
[facility] DEPT Provider Note   CSN: [id number] Arrival date & time: 09/20/16  1000   By signing my name below, I, [name], attest that this documentation has been prepared under the direction and in the presence of [name], MD  Electronically Signed: Delton Prairie, ED Scribe. 09/20/16. 10:40 AM.   History   Chief Complaint Chief Complaint  Patient presents with  . Dizziness    HPI Comments:  Donna [name] is a 76 y.o. female, with a PMHx of CHF with defibrillator, DM and HTN, who presents to the Emergency Department complaining of dizziness x yesterday which worsened today. Pt states her gait has been "wobbly". Relative notes the pt's stool has been dark and states the pt is on iron pills. No alleviating factors noted. Pt denies a hx of similar symptoms, new medication use or a change in blood pressure medication. Pt also denies fevers, chills, vomiting, diarrhea, abdominal pain, chest pain, hematochezia or any other associated symptoms. No other complaints noted at this time.   The history is provided by the patient. No language interpreter was used.    Past Medical History:  Diagnosis Date  . AICD (automatic cardioverter/defibrillator) present   . Arthritis    "fingers" (11/18/2014)  . Atrial flutter (HCC)    Versus ventricular tachycardia; treated with amiodarone  . Cardiomyopathy, nonischemic ([place])    Nl cors in [place]; CHF in 12/97; EF of 40% in 5/01 and 7/03, 25% in 9/04 and 4/08.  systolic murmur without valvular abnormalities by echo; refused automatic implantable cardiac defibrillator  . Cerebrovascular disease    Duplex study in 12/2006 shows atherosclerosis without focal stenosis  . CHF (congestive heart failure) ([place])   . Diabetes mellitus, type II ([place])    No insulin  . Gastroesophageal reflux disease   . Hyperlipidemia   . Hypertension   . Left bundle branch block   . Ventricular tachycardia ([place])    a. PMH it lists "atrial flutter  versus ventricular tachycardia treated with amiodarone" - details of this are unclear. Notes from back to 2006 indicate she has been maintained on amiodarone but do not describe further indication. Patient's sister reports pt was told she had skipped beats that could cause her to drop dead - Dr. Lattie Haw put her on amiodarone and recommended a defibrillator.    Patient Active Problem List   Diagnosis Date Noted  . Cerebrovascular disease 09/20/2016  . AICD (automatic cardioverter/defibrillator) present 09/20/2016  . CHF (congestive heart failure) ([place]) 09/20/2016  . Acute CVA (cerebrovascular accident) ([place]) 09/20/2016  . Confusion 06/27/2016  . Nausea 03/09/2016  . Dizziness 02/03/2016  . Fall on flat surface, tripped on rug; no injury 01/19/2016    Class: Acute  . Acute on chronic systolic CHF (congestive heart failure) ([place]) 12/29/2015  . Hypokalemia 05/18/2015  . Acute on chronic systolic congestive heart failure ([place]) 05/16/2015  . Acute on chronic systolic (congestive) heart failure ([place]) 05/16/2015  . Chronic systolic heart failure ([place]) 11/18/2014  . NICM (nonischemic cardiomyopathy) ([place]) 11/18/2014  . Acute on chronic systolic heart failure ([place]) 10/20/2014  . Acute respiratory failure with hypoxia ([place]) 10/18/2014  . CHF exacerbation ([place]) 07/22/2014  . Acute exacerbation of CHF (congestive heart failure) ([place]) 07/22/2014  . Abnormality of gait 09/26/2013  . Weakness of left hip 09/26/2013  . Diabetes mellitus, type II ([place]) 06/15/2011  . Cardiomyopathy, nonischemic ([place])   . Hypertension   . Hypothyroidism 05/28/2009  . Left bundle branch block 05/28/2009  .  GASTROESOPHAGEAL REFLUX DISEASE 05/28/2009  . Hyperlipidemia 05/14/2008  . VENTRICULAR TACHYCARDIA 05/14/2008  . CEREBROVASCULAR DISEASE 05/14/2008    Past Surgical History:  Procedure Laterality Date  . ABDOMINAL HYSTERECTOMY  2006  . BI-VENTRICULAR IMPLANTABLE CARDIOVERTER DEFIBRILLATOR  (CRT-D)  11/18/2014    . COLONOSCOPY  2008  . EP IMPLANTABLE DEVICE N/A 11/18/2014   Procedure: BiV ICD Insertion CRT-D;  Surgeon: Evans Lance, MD;  Location: [facility];  Service: Cardiovascular;  Laterality: N/A;  . HERNIA REPAIR Right   . TONSILLECTOMY      OB History    Gravida Para Term Preterm AB Living             2   SAB TAB Ectopic Multiple Live Births                   Home Medications    Prior to Admission medications   Medication Sig Start Date End Date Taking? Authorizing Provider  acetaminophen (TYLENOL) 500 MG tablet Take 500 mg by mouth every 6 (six) hours as needed.   Yes Historical Provider, MD  amiodarone (PACERONE) 200 MG tablet Take 100 mg by mouth daily at 12 noon.   Yes Historical Provider, MD  aspirin 81 MG tablet Take 81 mg by mouth daily.   Yes Historical Provider, MD  atorvastatin (LIPITOR) 20 MG tablet Take 1 tablet (20 mg total) by mouth daily at 6 PM. 07/24/14  Yes Sinda Du, MD  Carbamide Peroxide (EAR WAX DROPS OT) Place 1-2 drops in ear(s) daily as needed (wax build up).   Yes Historical Provider, MD  carvedilol (COREG) 3.125 MG tablet Take 1 tablet (3.125 mg total) by mouth 2 (two) times daily. 05/17/16  Yes Evans Lance, MD  insulin detemir (LEVEMIR) 100 UNIT/ML injection Inject 12 Units into the skin at bedtime.   Yes Sinda Du, MD  IRON PO Take 1 tablet by mouth daily.   Yes Historical Provider, MD  levothyroxine (SYNTHROID, LEVOTHROID) 75 MCG tablet Take 75 mcg by mouth daily before breakfast.   Yes Historical Provider, MD  loratadine (CLARITIN) 10 MG tablet Take 10 mg by mouth daily as needed for allergies.   Yes Historical Provider, MD  losartan (COZAAR) 25 MG tablet Take 0.5 tablets (12.5 mg total) by mouth daily. 06/29/16 08/31/17 Yes Imogene Burn, PA-C  Omega-3 Fatty Acids (FISH OIL PO) Take by mouth 3 (three) times daily.   Yes Historical Provider, MD  potassium chloride SA (KLOR-CON M20) 20 MEQ tablet Take 2 Tablets In The Morning And Take  1 Tablet In The Evening 06/29/16  Yes Imogene Burn, PA-C  sitaGLIPtin (JANUVIA) 100 MG tablet Take 100 mg by mouth daily.   Yes Historical Provider, MD  torsemide (DEMADEX) 20 MG tablet Take 1 tablet (20 mg total) by mouth daily. 05/12/16  Yes Arnoldo Lenis, MD  UNIFINE PENTIPS 32G X 4 MM MISC USE TWICE A DAY TO TAKE INSULIN. 01/06/16   Cassandria Anger, MD    Family History Family History  Problem Relation Age of Onset  . Diabetes Mother   . Kidney disease Mother   . Heart failure Mother   . Stomach cancer Father   . Hypertension Father   . Diabetes Sister   . Seizures Sister   . Heart attack Brother   . Seizures Brother   . Dementia Neg Hx     Social History Social History  Substance Use Topics  . Smoking status: Never Smoker  .  Smokeless tobacco: Never Used  . Alcohol use No     Allergies   Codeine and Decongestant [pseudoephedrine hcl er]   Review of Systems Review of Systems  Constitutional: Negative for chills and fever.  Cardiovascular: Negative for chest pain.  Gastrointestinal: Negative for abdominal pain, diarrhea and vomiting.  Neurological: Positive for dizziness.  All other systems reviewed and are negative.    Physical Exam Updated Vital Signs BP 127/69   Pulse 64   Temp 98.5 F (36.9 C) (Oral)   Resp 16   Wt 113 lb (51.3 kg)   SpO2 100%   BMI 20.67 kg/m   Physical Exam  Constitutional: She is oriented to person, place, and time. She appears well-developed and well-nourished. No distress.  HENT:  Head: Normocephalic and atraumatic.  No facial droop  Eyes: Conjunctivae and EOM are normal. Pupils are equal, round, and reactive to light.  Pupils equal bilateral  Cardiovascular: Normal rate and regular rhythm.   Pulmonary/Chest: Effort normal. She has rales.  Sparse crackles bilateral  Abdominal: She exhibits no distension.  Musculoskeletal: She exhibits edema.  Minimal edema in bilateral ankles.   Neurological: She is alert and  oriented to person, place, and time. No cranial nerve deficit.  No arm drift. 5+ and equal strength in upper extremities. No nystagmus. Finger to nose intact bilaterally. 5+ strength to lower extremities. Sensation intact grossly to all extremities. Pt is leaning and walking towards the left.    Skin: Skin is warm and dry.  Psychiatric: She has a normal mood and affect.  Nursing note and vitals reviewed.    ED Treatments / Results  DIAGNOSTIC STUDIES:  Oxygen Saturation is 95% on RA, normal by my interpretation.    COORDINATION OF CARE:  10:39 AM Discussed treatment plan with pt at bedside and pt agreed to plan.  Labs (all labs ordered are listed, but only abnormal results are displayed) Labs Reviewed  BASIC METABOLIC PANEL - Abnormal; Notable for the following:       Result Value   Chloride 98 (*)    All other components within normal limits  URINALYSIS, ROUTINE W REFLEX MICROSCOPIC - Abnormal; Notable for the following:    Color, Urine COLORLESS (*)    All other components within normal limits  CBC  MAGNESIUM  CBC  CREATININE, SERUM  CBG MONITORING, ED    EKG  EKG Interpretation None       Radiology Dg Chest 2 View  Result Date: 09/20/2016 CLINICAL DATA:  Dizziness and congestion since yesterday. EXAM: CHEST  2 VIEW COMPARISON:  PA and lateral chest 06/08/2016. Single-view of the chest 02/26/2016. FINDINGS: There is cardiomegaly and pulmonary edema with associated very small bilateral pleural effusions. Pacing device is in place. No pneumothorax. Aortic atherosclerosis is noted. IMPRESSION: Pulmonary edema with associated small bilateral pleural effusions. Atherosclerosis. Electronically Signed   By: Inge Rise M.D.   On: 09/20/2016 11:12   Ct Head Wo Contrast  Result Date: 09/20/2016 CLINICAL DATA:  Dizziness with unsteady gait EXAM: CT HEAD WITHOUT CONTRAST TECHNIQUE: Contiguous axial images were obtained from the base of the skull through the vertex without  intravenous contrast. COMPARISON:  07/06/2016 FINDINGS: Brain: No evidence of acute infarction, hemorrhage, extra-axial collection, ventriculomegaly, or mass effect. Bilateral frontal lobe extra-axial fluid isodense to CSF likely reflecting bilateral subdural hygromas. Small subcentimeter left parietal meningioma. Generalized cerebral atrophy. Periventricular white matter low attenuation likely secondary to microangiopathy. Vascular: Cerebrovascular atherosclerotic calcifications are noted. Skull: Negative for fracture or focal  lesion. Sinuses/Orbits: Visualized portions of the orbits are unremarkable. Visualized portions of the paranasal sinuses and mastoid air cells are unremarkable. Other: None. IMPRESSION: 1. No acute intracranial pathology. 2. Bilateral subdural hygromas. Electronically Signed   By: Kathreen Devoid   On: 09/20/2016 11:33    Procedures Procedures (including critical care time) CRITICAL CARE Performed by: Mariea Clonts   Total critical care time: 35 minutes  Critical care time was exclusive of separately billable procedures and treating other patients.  Critical care was necessary to treat or prevent imminent or life-threatening deterioration.  Critical care was time spent personally by me on the following activities: development of treatment plan with patient and/or surrogate as well as nursing, discussions with consultants, evaluation of patient's response to treatment, examination of patient, obtaining history from patient or surrogate, ordering and performing treatments and interventions, ordering and review of laboratory studies, ordering and review of radiographic studies, pulse oximetry and re-evaluation of patient's condition.  Medications Ordered in ED Medications  omega-3 acid ethyl esters (LOVAZA) capsule 1 g (not administered)  losartan (COZAAR) tablet 12.5 mg (not administered)  amiodarone (PACERONE) tablet 100 mg (not administered)  linagliptin (TRADJENTA)  tablet 5 mg (not administered)  carvedilol (COREG) tablet 3.125 mg (not administered)  torsemide (DEMADEX) tablet 20 mg (not administered)  acetaminophen (TYLENOL) tablet 500 mg (not administered)  levothyroxine (SYNTHROID, LEVOTHROID) tablet 75 mcg (not administered)  atorvastatin (LIPITOR) tablet 20 mg (not administered)  aspirin tablet 81 mg (not administered)   stroke: mapping our early stages of recovery book (not administered)  heparin injection 5,000 Units (not administered)  senna-docusate (Senokot-S) tablet 1 tablet (not administered)  clopidogrel (PLAVIX) tablet 75 mg (not administered)  furosemide (LASIX) injection 40 mg (40 mg Intravenous Given 09/20/16 1143)     Initial Impression / Assessment and Plan / ED Course  I have reviewed the triage vital signs and the nursing notes.  Pertinent labs & imaging results that were available during my care of the patient were reviewed by me and considered in my medical decision making (see chart for details).    Patient presents with dizziness and gait towards the left. Concern for possible stroke she is not within any time window. Patient did have crackles on exam however initially denied significant shortness of breath said it was mild. After lying flat for CT patient became very shortness of breath with difficulty breathing. Chest x-ray consistent with pulmonary edema. BiPAP ordered, Lasix ordered.  The patients results and plan were reviewed and discussed.   Any x-rays performed were independently reviewed by myself.   Differential diagnosis were considered with the presenting HPI.  Medications  omega-3 acid ethyl esters (LOVAZA) capsule 1 g (not administered)  losartan (COZAAR) tablet 12.5 mg (not administered)  amiodarone (PACERONE) tablet 100 mg (not administered)  linagliptin (TRADJENTA) tablet 5 mg (not administered)  carvedilol (COREG) tablet 3.125 mg (not administered)  torsemide (DEMADEX) tablet 20 mg (not administered)    acetaminophen (TYLENOL) tablet 500 mg (not administered)  levothyroxine (SYNTHROID, LEVOTHROID) tablet 75 mcg (not administered)  atorvastatin (LIPITOR) tablet 20 mg (not administered)  aspirin tablet 81 mg (not administered)   stroke: mapping our early stages of recovery book (not administered)  heparin injection 5,000 Units (not administered)  senna-docusate (Senokot-S) tablet 1 tablet (not administered)  clopidogrel (PLAVIX) tablet 75 mg (not administered)  furosemide (LASIX) injection 40 mg (40 mg Intravenous Given 09/20/16 1143)    Vitals:   09/20/16 1330 09/20/16 1400 09/20/16 1500 09/20/16 1600  BP: 117/73 123/83 132/71 127/69  Pulse: 60 63 (!) 59 64  Resp: 14 20 18 16   Temp:      TempSrc:      SpO2: 100% 96% 100% 100%  Weight:        Final diagnoses:  Dizziness  Gait difficulty  Acute dyspnea    Admission/ observation were discussed with the admitting physician, patient and/or family and they are comfortable with the plan.    Final Clinical Impressions(s) / ED Diagnoses   Final diagnoses:  Dizziness  Gait difficulty  Acute dyspnea    New Prescriptions New Prescriptions   No medications on file      [name], MD 09/20/16 1611

## 2016-09-20 NOTE — Telephone Encounter (Signed)
She had a CT Head wo contrast through Dr. Reather Converse office today at [facility].

## 2016-09-20 NOTE — ED Notes (Signed)
Pt taken off of Bipap by Dr. Truman Hayward. RT made aware.

## 2016-09-20 NOTE — H&P (Signed)
History and Physical    Donna [name] XVQ:[id number] DOB: [birth year] DOA: 09/20/2016  PCP: Alonza Bogus, MD  Patient coming from: Home.    Chief Complaint:  Leaning toward the right side, with ictus over 24 hours prior to admission.   HPI: Donna [name] is an 76 y.o. female with hx of CHF, reportedly compliant to her meds, hx of V tach s/p ICD placement, no prior CVA, DM on insulin and pills, HTN, HLD, brought to the ER by her daughter as she was feeling right handed weakness, and a tendency to lean to the right, with no vertigo.  She denied palpitation, HA, facial droop, slurred speech.  Her symptoms started 1 day PTA, and she has some intermittent symptoms.  While in CT, she also felt more SOB, and she did require transient Bipap, but felt better after IV lasix was given.  Further work up in the ER included a head CT which was negative for any CVA, but bilateral hygroma, EKG showed paced rhythm, and her serology was normal with normal renal fx, normal WBC and HB, and normal electrolytes.  Hospitalist was asked to admit her for stroke work up and for volume depletion.    ED Course:  See above.  Rewiew of Systems:  Constitutional: Negative for malaise, fever and chills. No significant weight loss or weight gain Eyes: Negative for eye pain, redness and discharge, diplopia, visual changes, or flashes of light. ENMT: Negative for ear pain, hoarseness, nasal congestion, sinus pressure and sore throat. No headaches; tinnitus, drooling, or problem swallowing. Cardiovascular: Negative for chest pain, palpitations, diaphoresis, dyspnea and peripheral edema. ; No orthopnea, PND Respiratory: Negative for cough, hemoptysis, wheezing and stridor. No pleuritic chestpain. Gastrointestinal: Negative for diarrhea, constipation,  melena, blood in stool, hematemesis, jaundice and rectal bleeding.    Genitourinary: Negative for frequency, dysuria, incontinence,flank pain and hematuria; Musculoskeletal:  Negative for back pain and neck pain. Negative for swelling and trauma.;  Skin: . Negative for pruritus, rash, abrasions, bruising and skin lesion.; ulcerations Neuro: Negative for headache, lightheadedness and neck stiffness. Negative for weakness, altered level of consciousness , altered mental status, extremity weakness, burning feet, involuntary movement, seizure and syncope.  Psych: negative for anxiety, depression, insomnia, tearfulness, panic attacks, hallucinations, paranoia, suicidal or homicidal ideation    Past Medical History:  Diagnosis Date  . AICD (automatic cardioverter/defibrillator) present   . Arthritis    "fingers" (11/18/2014)  . Atrial flutter (HCC)    Versus ventricular tachycardia; treated with amiodarone  . Cardiomyopathy, nonischemic ([place])    Nl cors in [place]; CHF in 12/97; EF of 40% in 5/01 and 7/03, 25% in 9/04 and 4/08.  systolic murmur without valvular abnormalities by echo; refused automatic implantable cardiac defibrillator  . Cerebrovascular disease    Duplex study in 12/2006 shows atherosclerosis without focal stenosis  . CHF (congestive heart failure) ([place])   . Diabetes mellitus, type II ([place])    No insulin  . Gastroesophageal reflux disease   . Hyperlipidemia   . Hypertension   . Left bundle branch block   . Ventricular tachycardia ([place])    a. PMH it lists "atrial flutter versus ventricular tachycardia treated with amiodarone" - details of this are unclear. Notes from back to 2006 indicate she has been maintained on amiodarone but do not describe further indication. Patient's sister reports pt was told she had skipped beats that could cause her to drop dead - Dr. Lattie Haw put her on amiodarone and recommended a defibrillator.  Past Surgical History:  Procedure Laterality Date  . ABDOMINAL HYSTERECTOMY  2006  . BI-VENTRICULAR IMPLANTABLE CARDIOVERTER DEFIBRILLATOR  (CRT-D)  11/18/2014  . COLONOSCOPY  2008  . EP IMPLANTABLE DEVICE N/A  11/18/2014   Procedure: BiV ICD Insertion CRT-D;  Surgeon: Evans Lance, MD;  Location: [facility];  Service: Cardiovascular;  Laterality: N/A;  . HERNIA REPAIR Right   . TONSILLECTOMY       reports that she has never smoked. She has never used smokeless tobacco. She reports that she does not drink alcohol or use drugs.  Allergies  Allergen Reactions  . Codeine Other (See Comments)    Reaction:  Racing heart   . Decongestant [Pseudoephedrine Hcl Er] Other (See Comments)    Reaction:  Racing heart     Family History  Problem Relation Age of Onset  . Diabetes Mother   . Kidney disease Mother   . Heart failure Mother   . Stomach cancer Father   . Hypertension Father   . Diabetes Sister   . Seizures Sister   . Heart attack Brother   . Seizures Brother   . Dementia Neg Hx      Prior to Admission medications   Medication Sig Start Date End Date Taking? Authorizing Provider  acetaminophen (TYLENOL) 500 MG tablet Take 500 mg by mouth every 6 (six) hours as needed.   Yes Historical Provider, MD  amiodarone (PACERONE) 200 MG tablet Take 100 mg by mouth daily at 12 noon.   Yes Historical Provider, MD  aspirin 81 MG tablet Take 81 mg by mouth daily.   Yes Historical Provider, MD  atorvastatin (LIPITOR) 20 MG tablet Take 1 tablet (20 mg total) by mouth daily at 6 PM. 07/24/14  Yes Sinda Du, MD  Carbamide Peroxide (EAR WAX DROPS OT) Place 1-2 drops in ear(s) daily as needed (wax build up).   Yes Historical Provider, MD  carvedilol (COREG) 3.125 MG tablet Take 1 tablet (3.125 mg total) by mouth 2 (two) times daily. 05/17/16  Yes Evans Lance, MD  insulin detemir (LEVEMIR) 100 UNIT/ML injection Inject 12 Units into the skin at bedtime.   Yes Sinda Du, MD  IRON PO Take 1 tablet by mouth daily.   Yes Historical Provider, MD  levothyroxine (SYNTHROID, LEVOTHROID) 75 MCG tablet Take 75 mcg by mouth daily before breakfast.   Yes Historical Provider, MD  loratadine  (CLARITIN) 10 MG tablet Take 10 mg by mouth daily as needed for allergies.   Yes Historical Provider, MD  losartan (COZAAR) 25 MG tablet Take 0.5 tablets (12.5 mg total) by mouth daily. 06/29/16 08/31/17 Yes Imogene Burn, PA-C  Omega-3 Fatty Acids (FISH OIL PO) Take by mouth 3 (three) times daily.   Yes Historical Provider, MD  potassium chloride SA (KLOR-CON M20) 20 MEQ tablet Take 2 Tablets In The Morning And Take 1 Tablet In The Evening 06/29/16  Yes Imogene Burn, PA-C  sitaGLIPtin (JANUVIA) 100 MG tablet Take 100 mg by mouth daily.   Yes Historical Provider, MD  torsemide (DEMADEX) 20 MG tablet Take 1 tablet (20 mg total) by mouth daily. 05/12/16  Yes Arnoldo Lenis, MD  UNIFINE PENTIPS 32G X 4 MM MISC USE TWICE A DAY TO TAKE INSULIN. 01/06/16   Cassandria Anger, MD    Physical Exam: Vitals:   09/20/16 1230 09/20/16 1300 09/20/16 1330 09/20/16 1400  BP: 124/72 118/67 117/73 123/83  Pulse: 64 60 60 63  Resp: 16 16 14  20  Temp:      TempSrc:      SpO2: 100% 100% 100% 96%  Weight:          Constitutional: NAD, calm, comfortable Vitals:   09/20/16 1230 09/20/16 1300 09/20/16 1330 09/20/16 1400  BP: 124/72 118/67 117/73 123/83  Pulse: 64 60 60 63  Resp: 16 16 14 20   Temp:      TempSrc:      SpO2: 100% 100% 100% 96%  Weight:       Eyes: PERRL, lids and conjunctivae normal ENMT: Mucous membranes are moist. Posterior pharynx clear of any exudate or lesions.Normal dentition.  Neck: normal, supple, no masses, no thyromegaly Respiratory: clear to auscultation bilaterally, no wheezing, no crackles. Normal respiratory effort. No accessory muscle use.  Cardiovascular: Regular rate and rhythm, no murmurs / rubs / gallops. No extremity edema. 2+ pedal pulses. No carotid bruits.  Abdomen: no tenderness, no masses palpated. No hepatosplenomegaly. Bowel sounds positive.  Musculoskeletal: no clubbing / cyanosis. No joint deformity upper and lower extremities. Good ROM, no contractures.  Normal muscle tone.  Skin: no rashes, lesions, ulcers. No induration Neurologic: CN 2-12 grossly intact. Sensation intact, DTR normal. Strength 5/5 in all 4.  Psychiatric: Normal judgment and insight. Alert and oriented x 3. Normal mood.   Labs on Admission: I have personally reviewed following labs and imaging studies CBC:  Recent Labs Lab 09/20/16 1035  WBC 6.2  HGB 12.9  HCT 40.6  MCV 100.0  PLT 941   Basic Metabolic Panel:  Recent Labs Lab 09/20/16 1035 09/20/16 1039  NA 138  --   K 4.0  --   CL 98*  --   CO2 31  --   GLUCOSE 96  --   BUN 17  --   CREATININE 0.72  --   CALCIUM 9.4  --   MG  --  2.3   CBG:  Recent Labs Lab 09/20/16 1022  GLUCAP 74    Urine analysis:    Component Value Date/Time   COLORURINE STRAW (A) 06/08/2016 1148   APPEARANCEUR CLEAR 06/08/2016 1148   LABSPEC 1.005 06/08/2016 1148   PHURINE 5.0 06/08/2016 1148   [place] 06/08/2016 1148   HGBUR NEGATIVE 06/08/2016 1148   BILIRUBINUR NEGATIVE 06/08/2016 1148   KETONESUR NEGATIVE 06/08/2016 1148   PROTEINUR NEGATIVE 06/08/2016 1148   NITRITE NEGATIVE 06/08/2016 1148   LEUKOCYTESUR NEGATIVE 06/08/2016 1148   Radiological Exams on Admission: Dg Chest 2 View  Result Date: 09/20/2016 CLINICAL DATA:  Dizziness and congestion since yesterday. EXAM: CHEST  2 VIEW COMPARISON:  PA and lateral chest 06/08/2016. Single-view of the chest 02/26/2016. FINDINGS: There is cardiomegaly and pulmonary edema with associated very small bilateral pleural effusions. Pacing device is in place. No pneumothorax. Aortic atherosclerosis is noted. IMPRESSION: Pulmonary edema with associated small bilateral pleural effusions. Atherosclerosis. Electronically Signed   By: Inge Rise M.D.   On: 09/20/2016 11:12   Ct Head Wo Contrast  Result Date: 09/20/2016 CLINICAL DATA:  Dizziness with unsteady gait EXAM: CT HEAD WITHOUT CONTRAST TECHNIQUE: Contiguous axial images were obtained from the base of the  skull through the vertex without intravenous contrast. COMPARISON:  07/06/2016 FINDINGS: Brain: No evidence of acute infarction, hemorrhage, extra-axial collection, ventriculomegaly, or mass effect. Bilateral frontal lobe extra-axial fluid isodense to CSF likely reflecting bilateral subdural hygromas. Small subcentimeter left parietal meningioma. Generalized cerebral atrophy. Periventricular white matter low attenuation likely secondary to microangiopathy. Vascular: Cerebrovascular atherosclerotic calcifications are noted. Skull: Negative for  fracture or focal lesion. Sinuses/Orbits: Visualized portions of the orbits are unremarkable. Visualized portions of the paranasal sinuses and mastoid air cells are unremarkable. Other: None. IMPRESSION: 1. No acute intracranial pathology. 2. Bilateral subdural hygromas. Electronically Signed   By: Kathreen Devoid   On: 09/20/2016 11:33    EKG: Independently reviewed.   Assessment/Plan Active Problems:   Cerebrovascular disease   AICD (automatic cardioverter/defibrillator) present   CHF (congestive heart failure) (HCC)   Acute CVA (cerebrovascular accident) ([place])   PLAN:   CVA:  I suspect she has had a CVA with ictus greater than 24 hours PTA.  CT should have pickup a lesion on the left hemisphere, but it did not.  She has an ICD, so no MRI, and I will defer to repeating a CT in 48 hours to Dr Luan Pulling, as it may not change any thing we do for her.  I would add Plavix to her regimen, as she has been compliant with her ASA.  Will obtain ECHO and US of the carotids.  Pulmonary edema:  She is on anti ischemic agents, and will continue.  Will continue with her oral diuretics.  She CHF is very mild.    HTN:  Stable.  Will continue with her meds.  DM:  Will place on carb modified diet, and use moderate SSI.     DVT prophylaxis: sub Q heparin.  Code Status: FULL CODE.  Family Communication: daughter, sister and friend at bedside with her permission.  Disposition  Plan: Home. Consults called: None. Admission status: inpatient.    Lynwood Kubisiak MD FACP. [facility]  If 7PM-7AM, please contact night-coverage [email or web address] Password [facility]  09/20/2016, 2:46 PM

## 2016-09-20 NOTE — ED Triage Notes (Addendum)
Pt reports that she was feeling dizzy yesterday, but dizziness is worse today. Sates she feels wobbly today. Denies HA. Has been having issues with stopped up. Family reports that 4 weeks ago she had an episode of confusion, didn't know family, and seeing people for 4 days. CBG over 200 at that time and extra meds given  and was seen by neurologist. Was suppose to have CT but was never called to schedule ordered by Dr Jaynee Eagles   CBG 74 intriage

## 2016-09-20 NOTE — Progress Notes (Signed)
EPIC Encounter for ICM Monitoring  Patient Name: Donna [name] is a 76 y.o. female Date: 09/20/2016 Primary Care Physican: Alonza Bogus, MD Primary Cardiologist: Wardell Heath PA Electrophysiologist: Lovena Le Dry Weight:     unknown Bi-V Pacing:  99%             Attempted call to patient's daughter Donna [name] [facility]).  Left message to return call.  Transmission reviewed. .   Thoracic impedance returned to normal after 2 days of increased Torsemide.   Prescribed and confirmed dosage: Torsemide 20 mg 1 tablet daily and Potassium 20 mEq 2 tablets in am and 1 tablet in pm.    Labs: 08/31/2016 Creatinine 0.85, BUN 21, Potassium 4.1, Sodium 135, EGFR >60 06/08/2016 Creatinine 0.81, BUN 15, Potassium 4.6, Sodium 138, EGFR >60   Recommendations: NONE - Unable to reach daughter   Follow-up plan: ICM clinic phone appointment on 10/17/2016.  Office appointment scheduled on 10/11/2016 with Dr Harl Bowie.  Copy of ICM check sent to primary cardiologist and device physician.   3 month ICM trend: 09/20/2016     1 Year ICM trend:      [name], RN 09/20/2016 7:41 AM

## 2016-09-20 NOTE — ED Notes (Signed)
RT made aware of bipap orders

## 2016-09-20 NOTE — Telephone Encounter (Signed)
Remote ICM transmission received.  Attemptedcall to daughter [name] and left message to return call.

## 2016-09-21 ENCOUNTER — Inpatient Hospital Stay (HOSPITAL_COMMUNITY)
Admit: 2016-09-21 | Discharge: 2016-09-21 | Disposition: A | Discharge status: Home / Self Care | Payer: Medicare HMO | Attending: Pulmonary Disease | Admitting: Pulmonary Disease

## 2016-09-21 ENCOUNTER — Inpatient Hospital Stay (HOSPITAL_COMMUNITY): Payer: Medicare HMO

## 2016-09-21 DIAGNOSIS — R569 Unspecified convulsions: Secondary | ICD-10-CM | POA: Diagnosis not present

## 2016-09-21 DIAGNOSIS — R269 Unspecified abnormalities of gait and mobility: Secondary | ICD-10-CM | POA: Diagnosis not present

## 2016-09-21 DIAGNOSIS — I69351 Hemiplegia and hemiparesis following cerebral infarction affecting right dominant side: Secondary | ICD-10-CM | POA: Diagnosis not present

## 2016-09-21 DIAGNOSIS — R2689 Other abnormalities of gait and mobility: Secondary | ICD-10-CM | POA: Diagnosis not present

## 2016-09-21 LAB — GLUCOSE, CAPILLARY
GLUCOSE-CAPILLARY: 146 mg/dL — AB (ref 65–99)
GLUCOSE-CAPILLARY: 172 mg/dL — AB (ref 65–99)
Glucose-Capillary: 120 mg/dL — ABNORMAL HIGH (ref 65–99)
Glucose-Capillary: 140 mg/dL — ABNORMAL HIGH (ref 65–99)

## 2016-09-21 LAB — LIPID PANEL
CHOL/HDL RATIO: 3 ratio
Cholesterol: 99 mg/dL (ref 0–200)
HDL: 33 mg/dL — AB (ref >40)
LDL CALC: 46 mg/dL (ref 0–99)
TRIGLYCERIDES: 100 mg/dL (ref <150)
VLDL: 20 mg/dL (ref 0–40)

## 2016-09-21 NOTE — Procedures (Signed)
[place] A. Merlene Laughter, MD     [email or web address]           HISTORY: AMS and R sided weakness  MEDICATIONS: Scheduled Meds: . amiodarone  100 mg Oral Q1200  . aspirin EC  81 mg Oral Daily  . atorvastatin  20 mg Oral q1800  . carvedilol  3.125 mg Oral BID  . clopidogrel  75 mg Oral Daily  . heparin  5,000 Units Subcutaneous Q8H  . insulin aspart  0-15 Units Subcutaneous TID WC  . insulin aspart  0-5 Units Subcutaneous QHS  . levothyroxine  75 mcg Oral QAC breakfast  . linagliptin  5 mg Oral Daily  . losartan  12.5 mg Oral Daily  . omega-3 acid ethyl esters  1 g Oral Daily  . torsemide  20 mg Oral Daily   Continuous Infusions: PRN Meds:.acetaminophen, senna-docusate  Prior to Admission medications   Medication Sig Start Date End Date Taking? Authorizing Provider  acetaminophen (TYLENOL) 500 MG tablet Take 500 mg by mouth every 6 (six) hours as needed.   Yes Historical Provider, MD  amiodarone (PACERONE) 200 MG tablet Take 100 mg by mouth daily at 12 noon.   Yes Historical Provider, MD  aspirin 81 MG tablet Take 81 mg by mouth daily.   Yes Historical Provider, MD  atorvastatin (LIPITOR) 20 MG tablet Take 1 tablet (20 mg total) by mouth daily at 6 PM. 07/24/14  Yes Sinda Du, MD  Carbamide Peroxide (EAR WAX DROPS OT) Place 1-2 drops in ear(s) daily as needed (wax build up).   Yes Historical Provider, MD  carvedilol (COREG) 3.125 MG tablet Take 1 tablet (3.125 mg total) by mouth 2 (two) times daily. 05/17/16  Yes Evans Lance, MD  insulin detemir (LEVEMIR) 100 UNIT/ML injection Inject 12 Units into the skin at bedtime.   Yes Sinda Du, MD  IRON PO Take 1 tablet by mouth daily.   Yes Historical Provider, MD  levothyroxine (SYNTHROID, LEVOTHROID) 75 MCG tablet Take 75 mcg by mouth daily before breakfast.   Yes Historical Provider, MD  loratadine (CLARITIN) 10 MG tablet Take 10 mg by mouth daily as needed for allergies.   Yes Historical Provider, MD    losartan (COZAAR) 25 MG tablet Take 0.5 tablets (12.5 mg total) by mouth daily. 06/29/16 08/31/17 Yes Imogene Burn, PA-C  Omega-3 Fatty Acids (FISH OIL PO) Take by mouth 3 (three) times daily.   Yes Historical Provider, MD  potassium chloride SA (KLOR-CON M20) 20 MEQ tablet Take 2 Tablets In The Morning And Take 1 Tablet In The Evening 06/29/16  Yes Imogene Burn, PA-C  sitaGLIPtin (JANUVIA) 100 MG tablet Take 100 mg by mouth daily.   Yes Historical Provider, MD  torsemide (DEMADEX) 20 MG tablet Take 1 tablet (20 mg total) by mouth daily. 05/12/16  Yes Arnoldo Lenis, MD  UNIFINE PENTIPS 32G X 4 MM MISC USE TWICE A DAY TO TAKE INSULIN. 01/06/16   Cassandria Anger, MD      ANALYSIS: A 16 channel recording using standard 10 20 measurements is conducted for 22 minutes. There is a dominant posterior rhythm of 7.5Hz  which attenuates with eye opening. There is beta activity seen over the frontal fields. Awake and drowsy activities are seen. Photic stimulation and hyperventilation did not elicit any abnormal responses. There is no focal slowing, lateralized slowing or epileptiform activity.     IMPRESSION: This recording shows mild global slowing but is otherwise normal.  Jancarlo Biermann A. Merlene Laughter, M.D.  Diplomate, Tax adviser of Psychiatry and Neurology ( Neurology).

## 2016-09-21 NOTE — Progress Notes (Signed)
SLP Cancellation Note  Patient Details Name: Donna [name] MRN: [id number] DOB: [birth year]   Cancelled treatment:        Pt presents at cognitive baseline. All symptoms have resolved. No further ST needs are indicated at this time. ST to sign off. Thank you,  Myah Guynes H. Roddie Mc, CCC-SLP Speech Language Pathologist    Wende Bushy 09/21/2016, 4:50 PM

## 2016-09-21 NOTE — Evaluation (Signed)
Occupational Therapy Evaluation Patient Details Name: Donna [name] MRN: [id number] DOB: 1940-09-03 Today's Date: 09/21/2016    History of Present Illness AARVI STOTTS is an 76 y.o. female with hx of CHF, reportedly compliant to her meds, hx of V tach s/p ICD placement, no prior CVA, DM on insulin and pills, HTN, HLD, brought to the ER by her daughter as she was feeling right handed weakness, and a tendency to lean to the right, with no vertigo.  She denied palpitation, HA, facial droop, slurred speech.  Her symptoms started 1 day PTA, and she has some intermittent symptoms.  While in CT, she also felt more SOB, and she did require transient Bipap, but felt better after IV lasix was given.  Further work up in the ER included a head CT which was negative for any CVA, but bilateral hygroma, EKG showed paced rhythm, and her serology was normal with normal renal fx, normal WBC and HB, and normal electrolytes.  Hospitalist was asked to admit her for stroke work up and for volume depletion.     Clinical Impression   Patient presents with slight weakness for left grip strength and left shoulder flexion although both are functional. Patient appears to be at Mod I level for bathing and dressing tasks. No additional OT needs at this time.     Follow Up Recommendations  No OT follow up    Equipment Recommendations  None recommended by OT       Precautions / Restrictions Precautions Precautions: Fall Precaution Comments: 1 fall in past 6 months. Tripped over items that she did not see on the floor.  Restrictions Weight Bearing Restrictions: No      Mobility                   Transfers Overall transfer level: Modified independent Equipment used: None                      ADL either performed or assessed with clinical judgement   ADL Overall ADL's : Modified independent;At baseline           Vision Baseline Vision/History: Wears glasses Wears Glasses: At all  times Patient Visual Report: No change from baseline              Pertinent Vitals/Pain Pain Assessment: No/denies pain     Hand Dominance Right   Extremity/Trunk Assessment Upper Extremity Assessment Upper Extremity Assessment: Overall WFL for tasks assessed (Left grip is slightly weaker as well as left shoudler flexion although it is functional.)           Communication Communication Communication: No difficulties   Cognition Arousal/Alertness: Awake/alert Behavior During Therapy: WFL for tasks assessed/performed Overall Cognitive Status: Within Functional Limits for tasks assessed                    Home Living Family/patient expects to be discharged to:: Private residence Living Arrangements: Other relatives (grandson) Available Help at Discharge: Family;Available PRN/intermittently (grandson works 1st and 2nd shift. ) Type of Home: House Home Access: Stairs to enter Technical brewer of Steps: 3 Entrance Stairs-Rails: Right [place]: One level (grandson lives upstairs. )     Bathroom Shower/Tub: Teacher, early years/pre: Standard     Home Equipment: Grab bars - tub/shower          Prior Functioning/Environment Level of Independence: Needs assistance  Gait / Transfers Assistance Needed: Patient reports that she does not  use any DME when ambulating.  ADL's / Homemaking Assistance Needed: Yolanda Bonine drives and completes meals. Patient is independent with bathing and dressing.  Communication / Swallowing Assistance Needed: No issues.                    End of Session Equipment Utilized During Treatment: Oxygen  Activity Tolerance: Patient tolerated treatment well Patient left: in bed;with call bell/phone within reach  OT Visit Diagnosis: Muscle weakness (generalized) (M62.81)                Time: 1610-9604 OT Time Calculation (min): 30 min Charges:  OT General Charges $OT Visit: 1 Procedure OT Evaluation $OT Eval Low  Complexity: 1 Procedure G-Codes: OT G-codes **NOT FOR INPATIENT CLASS** Functional Assessment Tool Used: AM-PAC 6 Clicks Daily Activity Functional Limitation: Self care Self Care Current Status (V4098): 0 percent impaired, limited or restricted Self Care Goal Status (J1914): 0 percent impaired, limited or restricted Self Care Discharge Status (N8295): 0 percent impaired, limited or restricted   [name], OTR/L,CBIS  [phone]   Cheyan Frees, Mickel Baas D 09/21/2016, 10:07 AM

## 2016-09-21 NOTE — Progress Notes (Signed)
Subjective: She was brought in with presumed stroke. It's not totally clear whether she has had a stroke or not. She has pretty severe vascular disease at baseline. CT did not show a stroke. She's not a candidate for MRI because of pacemaker/defibrillator. She has seen a neurologist in [place] regarding her confusion and she is felt to have pretty rapidly progressive cognitive decline. I think we need to get inpatient neurology involved here.  Objective: Vital signs in last 24 hours: Temp:  [97.6 F (36.4 C)-98.5 F (36.9 C)] 97.8 F (36.6 C) (04/18 0815) Pulse Rate:  [59-73] 64 (04/18 0815) Resp:  [14-27] 20 (04/18 0815) BP: (114-139)/(59-85) 125/71 (04/18 0815) SpO2:  [92 %-100 %] 100 % (04/18 0815) Weight:  [49.1 kg (108 lb 3.2 oz)-51.3 kg (113 lb)] 49.1 kg (108 lb 3.2 oz) (04/17 1615) Weight change:  Last BM Date: 09/19/16  Intake/Output from previous day: 04/17 0701 - 04/18 0700 In: 240 [P.O.:240] Out: 300 [Urine:300]  PHYSICAL EXAM General appearance: alert, cooperative and mild distress Resp: clear to auscultation bilaterally Cardio: Heart is regular. She does not seem to be in atrial fib now GI: soft, non-tender; bowel sounds normal; no masses,  no organomegaly Extremities: extremities normal, atraumatic, no cyanosis or edema She doesn't have focal neurological abnormalities at this point  Lab Results:  Results for orders placed or performed during the hospital encounter of 09/20/16 (from the past 48 hour(s))  Urinalysis, Routine w reflex microscopic     Status: Abnormal   Collection Time: 09/20/16 10:22 AM  Result Value Ref Range   Color, Urine COLORLESS (A) YELLOW   APPearance CLEAR CLEAR   Specific Gravity, Urine 1.005 1.005 - 1.030   pH 7.0 5.0 - 8.0   Glucose, UA NEGATIVE NEGATIVE mg/dL   Hgb urine dipstick NEGATIVE NEGATIVE   Bilirubin Urine NEGATIVE NEGATIVE   Ketones, ur NEGATIVE NEGATIVE mg/dL   Protein, ur NEGATIVE NEGATIVE mg/dL   Nitrite NEGATIVE  NEGATIVE   Leukocytes, UA NEGATIVE NEGATIVE  CBG monitoring, ED     Status: None   Collection Time: 09/20/16 10:22 AM  Result Value Ref Range   Glucose-Capillary 74 65 - 99 mg/dL  Basic metabolic panel     Status: Abnormal   Collection Time: 09/20/16 10:35 AM  Result Value Ref Range   Sodium 138 135 - 145 mmol/L   Potassium 4.0 3.5 - 5.1 mmol/L   Chloride 98 (L) 101 - 111 mmol/L   CO2 31 22 - 32 mmol/L   Glucose, Bld 96 65 - 99 mg/dL   BUN 17 6 - 20 mg/dL   Creatinine, Ser 0.72 0.44 - 1.00 mg/dL   Calcium 9.4 8.9 - 10.3 mg/dL   GFR calc non Af Amer >60 >60 mL/min   GFR calc Af Amer >60 >60 mL/min    Comment: (NOTE) The eGFR has been calculated using the CKD EPI equation. This calculation has not been validated in all clinical situations. eGFR's persistently <60 mL/min signify possible Chronic Kidney Disease.    Anion gap 9 5 - 15  CBC     Status: None   Collection Time: 09/20/16 10:35 AM  Result Value Ref Range   WBC 6.2 4.0 - 10.5 K/uL   RBC 4.06 3.87 - 5.11 MIL/uL   Hemoglobin 12.9 12.0 - 15.0 g/dL   HCT 40.6 36.0 - 46.0 %   MCV 100.0 78.0 - 100.0 fL   MCH 31.8 26.0 - 34.0 pg   MCHC 31.8 30.0 - 36.0 g/dL  RDW 15.1 11.5 - 15.5 %   Platelets 172 150 - 400 K/uL  Magnesium     Status: None   Collection Time: 09/20/16 10:39 AM  Result Value Ref Range   Magnesium 2.3 1.7 - 2.4 mg/dL  Glucose, capillary     Status: Abnormal   Collection Time: 09/20/16  5:06 PM  Result Value Ref Range   Glucose-Capillary 102 (H) 65 - 99 mg/dL  Glucose, capillary     Status: Abnormal   Collection Time: 09/20/16  8:37 PM  Result Value Ref Range   Glucose-Capillary 157 (H) 65 - 99 mg/dL   Comment 1 Notify RN    Comment 2 Document in Chart   Lipid panel     Status: Abnormal   Collection Time: 09/21/16  5:13 AM  Result Value Ref Range   Cholesterol 99 0 - 200 mg/dL   Triglycerides 100 <150 mg/dL   HDL 33 (L) >40 mg/dL   Total CHOL/HDL Ratio 3.0 RATIO   VLDL 20 0 - 40 mg/dL   LDL  Cholesterol 46 0 - 99 mg/dL    Comment:        Total Cholesterol/HDL:CHD Risk Coronary Heart Disease Risk Table                     Men   Women  1/2 Average Risk   3.4   3.3  Average Risk       5.0   4.4  2 X Average Risk   9.6   7.1  3 X Average Risk  23.4   11.0        Use the calculated Patient Ratio above and the CHD Risk Table to determine the patient's CHD Risk.        ATP III CLASSIFICATION (LDL):  <100     mg/dL   Optimal  100-129  mg/dL   Near or Above                    Optimal  130-159  mg/dL   Borderline  160-189  mg/dL   High  >190     mg/dL   Very High   Glucose, capillary     Status: Abnormal   Collection Time: 09/21/16  7:52 AM  Result Value Ref Range   Glucose-Capillary 120 (H) 65 - 99 mg/dL   Comment 1 Notify RN    Comment 2 Document in Chart     ABGS No results for input(s): PHART, PO2ART, TCO2, HCO3 in the last 72 hours.  Invalid input(s): PCO2 CULTURES No results found for this or any previous visit (from the past 240 hour(s)). Studies/Results: Dg Chest 2 View  Result Date: 09/20/2016 CLINICAL DATA:  Dizziness and congestion since yesterday. EXAM: CHEST  2 VIEW COMPARISON:  PA and lateral chest 06/08/2016. Single-view of the chest 02/26/2016. FINDINGS: There is cardiomegaly and pulmonary edema with associated very small bilateral pleural effusions. Pacing device is in place. No pneumothorax. Aortic atherosclerosis is noted. IMPRESSION: Pulmonary edema with associated small bilateral pleural effusions. Atherosclerosis. Electronically Signed   By: Inge Rise M.D.   On: 09/20/2016 11:12   Ct Head Wo Contrast  Result Date: 09/20/2016 CLINICAL DATA:  Dizziness with unsteady gait EXAM: CT HEAD WITHOUT CONTRAST TECHNIQUE: Contiguous axial images were obtained from the base of the skull through the vertex without intravenous contrast. COMPARISON:  07/06/2016 FINDINGS: Brain: No evidence of acute infarction, hemorrhage, extra-axial collection,  ventriculomegaly, or mass effect. Bilateral frontal lobe  extra-axial fluid isodense to CSF likely reflecting bilateral subdural hygromas. Small subcentimeter left parietal meningioma. Generalized cerebral atrophy. Periventricular white matter low attenuation likely secondary to microangiopathy. Vascular: Cerebrovascular atherosclerotic calcifications are noted. Skull: Negative for fracture or focal lesion. Sinuses/Orbits: Visualized portions of the orbits are unremarkable. Visualized portions of the paranasal sinuses and mastoid air cells are unremarkable. Other: None. IMPRESSION: 1. No acute intracranial pathology. 2. Bilateral subdural hygromas. Electronically Signed   By: Kathreen Devoid   On: 09/20/2016 11:33    Medications:  Prior to Admission:  Prescriptions Prior to Admission  Medication Sig Dispense Refill Last Dose  . acetaminophen (TYLENOL) 500 MG tablet Take 500 mg by mouth every 6 (six) hours as needed.   unknown  . amiodarone (PACERONE) 200 MG tablet Take 100 mg by mouth daily at 12 noon.   09/19/2016 at Unknown time  . aspirin 81 MG tablet Take 81 mg by mouth daily.   09/20/2016 at Unknown time  . atorvastatin (LIPITOR) 20 MG tablet Take 1 tablet (20 mg total) by mouth daily at 6 PM. 30 tablet 12 09/19/2016 at Unknown time  . Carbamide Peroxide (EAR WAX DROPS OT) Place 1-2 drops in ear(s) daily as needed (wax build up).   09/20/2016 at Unknown time  . carvedilol (COREG) 3.125 MG tablet Take 1 tablet (3.125 mg total) by mouth 2 (two) times daily. 60 tablet 11 09/20/2016 at 0830  . insulin detemir (LEVEMIR) 100 UNIT/ML injection Inject 12 Units into the skin at bedtime.   09/19/2016 at Unknown time  . IRON PO Take 1 tablet by mouth daily.   09/20/2016 at Unknown time  . levothyroxine (SYNTHROID, LEVOTHROID) 75 MCG tablet Take 75 mcg by mouth daily before breakfast.   09/20/2016 at Unknown time  . loratadine (CLARITIN) 10 MG tablet Take 10 mg by mouth daily as needed for allergies.   09/20/2016 at  Unknown time  . losartan (COZAAR) 25 MG tablet Take 0.5 tablets (12.5 mg total) by mouth daily. 90 tablet 3 09/20/2016 at Unknown time  . Omega-3 Fatty Acids (FISH OIL PO) Take by mouth 3 (three) times daily.   09/20/2016 at Unknown time  . potassium chloride SA (KLOR-CON M20) 20 MEQ tablet Take 2 Tablets In The Morning And Take 1 Tablet In The Evening 90 tablet 3 09/20/2016 at Unknown time  . sitaGLIPtin (JANUVIA) 100 MG tablet Take 100 mg by mouth daily.   09/20/2016 at Unknown time  . torsemide (DEMADEX) 20 MG tablet Take 1 tablet (20 mg total) by mouth daily. 180 tablet 3 09/20/2016 at Unknown time  . UNIFINE PENTIPS 32G X 4 MM MISC USE TWICE A DAY TO TAKE INSULIN. 100 each 2 Taking   Scheduled: .  stroke: mapping our early stages of recovery book   Does not apply Once  . amiodarone  100 mg Oral Q1200  . aspirin EC  81 mg Oral Daily  . atorvastatin  20 mg Oral q1800  . carvedilol  3.125 mg Oral BID  . clopidogrel  75 mg Oral Daily  . heparin  5,000 Units Subcutaneous Q8H  . insulin aspart  0-15 Units Subcutaneous TID WC  . insulin aspart  0-5 Units Subcutaneous QHS  . levothyroxine  75 mcg Oral QAC breakfast  . linagliptin  5 mg Oral Daily  . losartan  12.5 mg Oral Daily  . omega-3 acid ethyl esters  1 g Oral Daily  . torsemide  20 mg Oral Daily   Continuous:  GBT:[id number], senna-docusate  Assesment: She had what may have been a stroke. At baseline she has had rapidly developing cognitive impairment. This waxes and wanes. I think it's a possibility that she may have had a seizure so I afford an EEG. At baseline she has heart failure which is severe enough that she needed AICD. Active Problems:   Cerebrovascular disease   AICD (automatic cardioverter/defibrillator) present   CHF (congestive heart failure) ([place])   Acute CVA (cerebrovascular accident) ([place])    Plan: EEG. Inpatient neurology consultation. Continue workup.    LOS: 1 day   Maryn Freelove L 09/21/2016, 8:36  AM

## 2016-09-21 NOTE — Consult Note (Signed)
[place] A. Merlene Laughter, MD     [email or web address]          Donna [name] is an 76 y.o. female.   ASSESSMENT/PLAN: 1. The patient likely has had a small deep white matter cerebral infarct or TIA. Risk factors includes age, prior infarct, hypertension and diabetes. The patient aspirin will be increased to 325. She currently is on Plavix but this can be continued on discharge. Continue with statin, hypertension and diabetes control.  2. Small extra-axial left frontal mass consistent with meningioma. This appears to be stable.  3. Bifrontal hygromas also stable.  The patient is 76 year old white female who developed acute sensation of disequilibrium. It lasted for several minutes. She does not report spinning sensation but again describing a sensation as if she is unsteady on her feet. The family reports that she did have left facial drooping and her speech was dysarthric. No focal weakness of the extremities are reported. No numbness. No loss of consciousness. No headaches. No chest pain or shortness of breath. The review systems otherwise negative. She is back to baseline. The patient has a history of a small meningioma diagnosed radiographically and also hygromas bilaterally. She has seen Dr Cherlyn Cushing who referred her to a neurosurgeon. The family tells me that conservative treatment/observation was recommended for these conditions.    GENERAL: Very pleasant female average weight and in no acute distress.  HEENT: Normal  ABDOMEN: soft  EXTREMITIES: No edema   BACK: Normal  SKIN: Normal by inspection.    MENTAL STATUS: Alert and oriented - including oriented to her age and the month. Speech, language and cognition are generally intact. Judgment and insight normal.   CRANIAL NERVES: Pupils are equal, round and reactive to light and accomodation; extra ocular movements are full, there is no significant nystagmus; visual fields are full; upper and lower facial muscles  are normal in strength and symmetric, there is no flattening of the nasolabial folds; tongue is midline; uvula is midline; shoulder elevation is normal.  MOTOR: Normal tone, bulk and strength; no pronator drift.  No drift of the upper lower extremities are observed.  COORDINATION: Left finger to nose is normal, right finger to nose is normal, No rest tremor; no intention tremor; no postural tremor; no bradykinesia.  REFLEXES: Deep tendon reflexes are symmetrical and normal. Babinski reflexes are flexor bilaterally.   SENSATION: Normal to light touch and temperature.   NIH stroke scale 0.    The brain CT scan is observed. There is significant bifrontal hygromas are observed appears because in some mass effect on the underlying brain structure but unchanged from previous scan. There is a small extra-axial hyperdense lesion consistent with meningioma. This is also unchanged from the previous scan.   Blood pressure 130/74, pulse 65, temperature 98.5 F (36.9 C), temperature source Oral, resp. rate 20, height '5\' 1"'  (1.549 m), weight 108 lb 3.2 oz (49.1 kg), SpO2 100 %.  Past Medical History:  Diagnosis Date  . AICD (automatic cardioverter/defibrillator) present   . Arthritis    "fingers" (11/18/2014)  . Atrial flutter (HCC)    Versus ventricular tachycardia; treated with amiodarone  . Cardiomyopathy, nonischemic ([place])    Nl cors in [place]; CHF in 12/97; EF of 40% in 5/01 and 7/03, 25% in 9/04 and 4/08.  systolic murmur without valvular abnormalities by echo; refused automatic implantable cardiac defibrillator  . Cerebrovascular disease    Duplex study in 12/2006 shows atherosclerosis without focal stenosis  . CHF (congestive  heart failure) ([place])   . Diabetes mellitus, type II ([place])    No insulin  . Gastroesophageal reflux disease   . Hyperlipidemia   . Hypertension   . Left bundle branch block   . Ventricular tachycardia ([place])    a. PMH it lists "atrial flutter versus  ventricular tachycardia treated with amiodarone" - details of this are unclear. Notes from back to 2006 indicate she has been maintained on amiodarone but do not describe further indication. Patient's sister reports pt was told she had skipped beats that could cause her to drop dead - Dr. Lattie Haw put her on amiodarone and recommended a defibrillator.    Past Surgical History:  Procedure Laterality Date  . ABDOMINAL HYSTERECTOMY  2006  . BI-VENTRICULAR IMPLANTABLE CARDIOVERTER DEFIBRILLATOR  (CRT-D)  11/18/2014  . COLONOSCOPY  2008  . EP IMPLANTABLE DEVICE N/A 11/18/2014   Procedure: BiV ICD Insertion CRT-D;  Surgeon: Evans Lance, MD;  Location: [facility];  Service: Cardiovascular;  Laterality: N/A;  . HERNIA REPAIR Right   . TONSILLECTOMY      Family History  Problem Relation Age of Onset  . Diabetes Mother   . Kidney disease Mother   . Heart failure Mother   . Stomach cancer Father   . Hypertension Father   . Diabetes Sister   . Seizures Sister   . Heart attack Brother   . Seizures Brother   . Dementia Neg Hx     Social History:  reports that she has never smoked. She has never used smokeless tobacco. She reports that she does not drink alcohol or use drugs.  Allergies:  Allergies  Allergen Reactions  . Codeine Other (See Comments)    Reaction:  Racing heart   . Decongestant [Pseudoephedrine Hcl Er] Other (See Comments)    Reaction:  Racing heart     Medications: Prior to Admission medications   Medication Sig Start Date End Date Taking? Authorizing Provider  acetaminophen (TYLENOL) 500 MG tablet Take 500 mg by mouth every 6 (six) hours as needed.   Yes Historical Provider, MD  amiodarone (PACERONE) 200 MG tablet Take 100 mg by mouth daily at 12 noon.   Yes Historical Provider, MD  aspirin 81 MG tablet Take 81 mg by mouth daily.   Yes Historical Provider, MD  atorvastatin (LIPITOR) 20 MG tablet Take 1 tablet (20 mg total) by mouth daily at 6 PM. 07/24/14  Yes  Sinda Du, MD  Carbamide Peroxide (EAR WAX DROPS OT) Place 1-2 drops in ear(s) daily as needed (wax build up).   Yes Historical Provider, MD  carvedilol (COREG) 3.125 MG tablet Take 1 tablet (3.125 mg total) by mouth 2 (two) times daily. 05/17/16  Yes Evans Lance, MD  insulin detemir (LEVEMIR) 100 UNIT/ML injection Inject 12 Units into the skin at bedtime.   Yes Sinda Du, MD  IRON PO Take 1 tablet by mouth daily.   Yes Historical Provider, MD  levothyroxine (SYNTHROID, LEVOTHROID) 75 MCG tablet Take 75 mcg by mouth daily before breakfast.   Yes Historical Provider, MD  loratadine (CLARITIN) 10 MG tablet Take 10 mg by mouth daily as needed for allergies.   Yes Historical Provider, MD  losartan (COZAAR) 25 MG tablet Take 0.5 tablets (12.5 mg total) by mouth daily. 06/29/16 08/31/17 Yes Imogene Burn, PA-C  Omega-3 Fatty Acids (FISH OIL PO) Take by mouth 3 (three) times daily.   Yes Historical Provider, MD  potassium chloride SA (KLOR-CON M20) 20 MEQ  tablet Take 2 Tablets In The Morning And Take 1 Tablet In The Evening 06/29/16  Yes Imogene Burn, PA-C  sitaGLIPtin (JANUVIA) 100 MG tablet Take 100 mg by mouth daily.   Yes Historical Provider, MD  torsemide (DEMADEX) 20 MG tablet Take 1 tablet (20 mg total) by mouth daily. 05/12/16  Yes Arnoldo Lenis, MD  UNIFINE PENTIPS 32G X 4 MM MISC USE TWICE A DAY TO TAKE INSULIN. 01/06/16   Cassandria Anger, MD    Scheduled Meds: . amiodarone  100 mg Oral Q1200  . aspirin EC  81 mg Oral Daily  . atorvastatin  20 mg Oral q1800  . carvedilol  3.125 mg Oral BID  . clopidogrel  75 mg Oral Daily  . heparin  5,000 Units Subcutaneous Q8H  . insulin aspart  0-15 Units Subcutaneous TID WC  . insulin aspart  0-5 Units Subcutaneous QHS  . levothyroxine  75 mcg Oral QAC breakfast  . linagliptin  5 mg Oral Daily  . losartan  12.5 mg Oral Daily  . omega-3 acid ethyl esters  1 g Oral Daily  . torsemide  20 mg Oral Daily   Continuous  Infusions: PRN Meds:.acetaminophen, senna-docusate     Results for orders placed or performed during the hospital encounter of 09/20/16 (from the past 48 hour(s))  Urinalysis, Routine w reflex microscopic     Status: Abnormal   Collection Time: 09/20/16 10:22 AM  Result Value Ref Range   Color, Urine COLORLESS (A) YELLOW   APPearance CLEAR CLEAR   Specific Gravity, Urine 1.005 1.005 - 1.030   pH 7.0 5.0 - 8.0   Glucose, UA NEGATIVE NEGATIVE mg/dL   Hgb urine dipstick NEGATIVE NEGATIVE   Bilirubin Urine NEGATIVE NEGATIVE   Ketones, ur NEGATIVE NEGATIVE mg/dL   Protein, ur NEGATIVE NEGATIVE mg/dL   Nitrite NEGATIVE NEGATIVE   Leukocytes, UA NEGATIVE NEGATIVE  CBG monitoring, ED     Status: None   Collection Time: 09/20/16 10:22 AM  Result Value Ref Range   Glucose-Capillary 74 65 - 99 mg/dL  Basic metabolic panel     Status: Abnormal   Collection Time: 09/20/16 10:35 AM  Result Value Ref Range   Sodium 138 135 - 145 mmol/L   Potassium 4.0 3.5 - 5.1 mmol/L   Chloride 98 (L) 101 - 111 mmol/L   CO2 31 22 - 32 mmol/L   Glucose, Bld 96 65 - 99 mg/dL   BUN 17 6 - 20 mg/dL   Creatinine, Ser 0.72 0.44 - 1.00 mg/dL   Calcium 9.4 8.9 - 10.3 mg/dL   GFR calc non Af Amer >60 >60 mL/min   GFR calc Af Amer >60 >60 mL/min    Comment: (NOTE) The eGFR has been calculated using the CKD EPI equation. This calculation has not been validated in all clinical situations. eGFR's persistently <60 mL/min signify possible Chronic Kidney Disease.    Anion gap 9 5 - 15  CBC     Status: None   Collection Time: 09/20/16 10:35 AM  Result Value Ref Range   WBC 6.2 4.0 - 10.5 K/uL   RBC 4.06 3.87 - 5.11 MIL/uL   Hemoglobin 12.9 12.0 - 15.0 g/dL   HCT 40.6 36.0 - 46.0 %   MCV 100.0 78.0 - 100.0 fL   MCH 31.8 26.0 - 34.0 pg   MCHC 31.8 30.0 - 36.0 g/dL   RDW 15.1 11.5 - 15.5 %   Platelets 172 150 - 400 K/uL  Magnesium  Status: None   Collection Time: 09/20/16 10:39 AM  Result Value Ref Range    Magnesium 2.3 1.7 - 2.4 mg/dL  Glucose, capillary     Status: Abnormal   Collection Time: 09/20/16  5:06 PM  Result Value Ref Range   Glucose-Capillary 102 (H) 65 - 99 mg/dL  Glucose, capillary     Status: Abnormal   Collection Time: 09/20/16  8:37 PM  Result Value Ref Range   Glucose-Capillary 157 (H) 65 - 99 mg/dL   Comment 1 Notify RN    Comment 2 Document in Chart   Lipid panel     Status: Abnormal   Collection Time: 09/21/16  5:13 AM  Result Value Ref Range   Cholesterol 99 0 - 200 mg/dL   Triglycerides 100 <150 mg/dL   HDL 33 (L) >40 mg/dL   Total CHOL/HDL Ratio 3.0 RATIO   VLDL 20 0 - 40 mg/dL   LDL Cholesterol 46 0 - 99 mg/dL    Comment:        Total Cholesterol/HDL:CHD Risk Coronary Heart Disease Risk Table                     Men   Women  1/2 Average Risk   3.4   3.3  Average Risk       5.0   4.4  2 X Average Risk   9.6   7.1  3 X Average Risk  23.4   11.0        Use the calculated Patient Ratio above and the CHD Risk Table to determine the patient's CHD Risk.        ATP III CLASSIFICATION (LDL):  <100     mg/dL   Optimal  100-129  mg/dL   Near or Above                    Optimal  130-159  mg/dL   Borderline  160-189  mg/dL   High  >190     mg/dL   Very High   Glucose, capillary     Status: Abnormal   Collection Time: 09/21/16  7:52 AM  Result Value Ref Range   Glucose-Capillary 120 (H) 65 - 99 mg/dL   Comment 1 Notify RN    Comment 2 Document in Chart   Glucose, capillary     Status: Abnormal   Collection Time: 09/21/16 11:50 AM  Result Value Ref Range   Glucose-Capillary 140 (H) 65 - 99 mg/dL   Comment 1 Notify RN    Comment 2 Document in Chart   Glucose, capillary     Status: Abnormal   Collection Time: 09/21/16  4:32 PM  Result Value Ref Range   Glucose-Capillary 146 (H) 65 - 99 mg/dL   Comment 1 Notify RN    Comment 2 Document in Chart     Studies/Results:  CAROTID DOPPLERS IMPRESSION: Less than 50% stenosis in the right and left  internal carotid arteries.    HEAD CT FINDINGS: Brain: No evidence of acute infarction, hemorrhage, extra-axial collection, ventriculomegaly, or mass effect. Bilateral frontal lobe extra-axial fluid isodense to CSF likely reflecting bilateral subdural hygromas. Small subcentimeter left parietal meningioma. Generalized cerebral atrophy. Periventricular white matter low attenuation likely secondary to microangiopathy.  Vascular: Cerebrovascular atherosclerotic calcifications are noted.  Skull: Negative for fracture or focal lesion.  Sinuses/Orbits: Visualized portions of the orbits are unremarkable. Visualized portions of the paranasal sinuses and mastoid air cells are unremarkable.  Other: None.  IMPRESSION: 1. No acute intracranial pathology. 2. Bilateral subdural hygromas.           Donna [name] A. Merlene Laughter, M.D.  Diplomate, [employer] adviser of Psychiatry and Neurology ( Neurology). 09/21/2016, 5:58 PM

## 2016-09-21 NOTE — Telephone Encounter (Signed)
And it looks stable to me, thanks

## 2016-09-21 NOTE — Progress Notes (Signed)
Patient Saturations on Room Air at Rest = 93___%  Patient Saturations on [employer] while Ambulating = 72%  Patient Saturations on 2 Liters of oxygen while Ambulating = 96__%  Please briefly explain why patient needs home oxygen:   The patient's oxygen saturation drops tremendously while ambulating on room air.  The patient dropped from 96 to 72 at approximately 70 feet of movement.

## 2016-09-21 NOTE — Progress Notes (Signed)
EEG completed, results pending. 

## 2016-09-21 NOTE — Evaluation (Signed)
Physical Therapy Evaluation Patient Details Name: Donna [name] MRN: [id number] DOB: 1941/04/01 Today's Date: 09/21/2016   History of Present Illness  Donna [name] is an 76 y.o. female with hx of CHF, reportedly compliant to her meds, hx of V tach s/p ICD placement, no prior CVA, DM on insulin and pills, HTN, HLD, brought to the ER by her daughter as she was feeling right handed weakness, and a tendency to lean to the right, with no vertigo.  She denied palpitation, HA, facial droop, slurred speech.  Her symptoms started 1 day PTA, and she has some intermittent symptoms.  While in CT, she also felt more SOB, and she did require transient Bipap, but felt better after IV lasix was given.  Further work up in the ER included a head CT which was negative for any CVA, but bilateral hygroma, EKG showed paced rhythm, and her serology was normal with normal renal fx, normal WBC and HB, and normal electrolytes.  Hospitalist was asked to admit her for stroke work up and for volume depletion.      Clinical Impression  Pt received sitting on the EOB, and was agreeable to PT evaluation.  Pt is normally independent with ambulation, dressing, and bathing, but has been working with a HHPT for the past week.  During PT evaluation, she was able to ambulate a total of 265f, however after 754fshe desaturated from 96% down to 72% while on RA.  She was able to take a seated rest break, and deep breathing brought SpO2 back up to 94%, however she remained in the mid - high 80's during the rest of ambulation despite cues for deep breathing.  Therefore, upon return to pt's room, she was placed back on 2L with improvement to 96%. Pt does not demonstrate need for acute skilled PT, however recommend that she continue with HHPT upon d/c.      Follow Up Recommendations Home health PT    Equipment Recommendations  None recommended by PT    Recommendations for Other Services       Precautions / Restrictions  Precautions Precautions: Fall Precaution Comments: 1 fall in past 6 months. Tripped over items that she did not see on the floor.  Restrictions Weight Bearing Restrictions: No      Mobility  Bed Mobility               General bed mobility comments: Pt received sitting on the EOB.   Transfers Overall transfer level: Modified independent Equipment used: None                Ambulation/Gait Ambulation/Gait assistance: Supervision   Assistive device: None Gait Pattern/deviations: Step-through pattern;Antalgic;Trendelenburg   Gait velocity interpretation: <1.8 ft/sec, indicative of risk for recurrent falls General Gait Details: R trendelenburg with 1 minor LOB.  Pt demonstrates SpO2 desaturation from 96% down to 72% while on RA after 7035f She improves with seated rest break and vc's for deep breathing, but she does not maintain when she is ambulating.   RN notified.   Stairs            Wheelchair Mobility    Modified Rankin (Stroke Patients Only)       Balance Overall balance assessment: History of Falls;Needs assistance   Sitting balance-Leahy Scale: Good     Standing balance support: No upper extremity supported Standing balance-Leahy Scale: Fair  Pertinent Vitals/Pain Pain Assessment: No/denies pain    Home Living Family/patient expects to be discharged to:: Private residence Living Arrangements: Other relatives (Donna [name]) Available Help at Discharge: Family;Available PRN/intermittently (Donna [name] works 1st and 2nd shift. ) Type of Home: House Home Access: Stairs to enter Entrance Stairs-Rails: Right Entrance Stairs-Number of Steps: 3 Home Layout: One level (Donna [name] lives upstairs. ) Home Equipment: Grab bars - tub/shower      Prior Function Level of Independence: Needs assistance   Gait / Transfers Assistance Needed: Patient reports that she does not use any DME when ambulating.   ADL's /  Homemaking Assistance Needed: Donna [name] drives and completes meals. Patient is independent with bathing and dressing.         Hand Dominance   Dominant Hand: Right    Extremity/Trunk Assessment   Upper Extremity Assessment Upper Extremity Assessment: Defer to OT evaluation    Lower Extremity Assessment Lower Extremity Assessment: Generalized weakness (R hip weaker as seen with functional mobility and gait.  Pt states its due to arthritis. )       Communication   Communication: No difficulties  Cognition Arousal/Alertness: Awake/alert Behavior During Therapy: WFL for tasks assessed/performed Overall Cognitive Status: Within Functional Limits for tasks assessed                                        General Comments      Exercises     Assessment/Plan    PT Assessment All further PT needs can be met in the next venue of care  PT Problem List Decreased strength;Decreased activity tolerance;Decreased balance;Decreased mobility;Cardiopulmonary status limiting activity       PT Treatment Interventions      PT Goals (Current goals can be found in the Care Plan section)  Acute Rehab PT Goals Patient Stated Goal: To go home.  PT Goal Formulation: All assessment and education complete, DC therapy    Frequency     Barriers to discharge Decreased caregiver support Lives with Donna [name], but does not have 24/7 supervision/assistance.     Co-evaluation               End of Session Equipment Utilized During Treatment: Gait belt Activity Tolerance: Patient tolerated treatment well Patient left: in chair;with call bell/phone within reach Nurse Communication: Mobility status PT Visit Diagnosis: Unsteadiness on feet (R26.81);Other abnormalities of gait and mobility (R26.89)    Time: 2919-1660 PT Time Calculation (min) (ACUTE ONLY): 31 min   Charges:   PT Evaluation $PT Eval Low Complexity: 1 Procedure PT Treatments $Gait Training: 8-22 mins   PT  G Codes:   PT G-Codes **NOT FOR INPATIENT CLASS** Functional Assessment Tool Used: AM-PAC 6 Clicks Basic Mobility;Clinical judgement Functional Limitation: Mobility: Walking and moving around Mobility: Walking and Moving Around Current Status (A0045): At least 20 percent but less than 40 percent impaired, limited or restricted Mobility: Walking and Moving Around Goal Status [phone]): At least 20 percent but less than 40 percent impaired, limited or restricted Mobility: Walking and Moving Around Discharge Status [phone]): At least 20 percent but less than 40 percent impaired, limited or restricted    Beth Ege Muckey, PT, DPT X: [phone]

## 2016-09-21 NOTE — Progress Notes (Signed)
Oxygen Assessment - Patient is at 95 percent on 2 liters nasal canula.  Patient did not use oxygen at home prior to this admission.  Upon ambulation without oxygen, at approximately 70 feet, the patient dropped to 72 percent.  Oxygen was reapplied and patient regained  O2 level of  95 percent.

## 2016-09-22 ENCOUNTER — Inpatient Hospital Stay (HOSPITAL_COMMUNITY): Payer: Medicare HMO

## 2016-09-22 DIAGNOSIS — I5023 Acute on chronic systolic (congestive) heart failure: Secondary | ICD-10-CM

## 2016-09-22 DIAGNOSIS — I6789 Other cerebrovascular disease: Secondary | ICD-10-CM

## 2016-09-22 LAB — TROPONIN I
Troponin I: 0.04 ng/mL (ref <0.03)
Troponin I: 0.05 ng/mL (ref <0.03)
Troponin I: 0.04 ng/mL (ref <0.03)

## 2016-09-22 LAB — HEMOGLOBIN A1C
HEMOGLOBIN A1C: 5.7 % — AB (ref 4.8–5.6)
Mean Plasma Glucose: 117 mg/dL

## 2016-09-22 LAB — GLUCOSE, CAPILLARY
GLUCOSE-CAPILLARY: 161 mg/dL — AB (ref 65–99)
GLUCOSE-CAPILLARY: 168 mg/dL — AB (ref 65–99)
GLUCOSE-CAPILLARY: 152 mg/dL — AB (ref 65–99)
Glucose-Capillary: 151 mg/dL — ABNORMAL HIGH (ref 65–99)

## 2016-09-22 LAB — ECHOCARDIOGRAM COMPLETE
Height: 61 in
Weight: 1731.2 oz

## 2016-09-22 MED ORDER — FUROSEMIDE 10 MG/ML IJ SOLN
40.0000 mg | Freq: Two times a day (BID) | INTRAMUSCULAR | Status: DC
  Administered 2016-09-22 – 2016-09-23 (×3): 40 mg via INTRAVENOUS
  Filled 2016-09-22 (×3): qty 4

## 2016-09-22 MED ORDER — NITROGLYCERIN 0.4 MG SL SUBL
0.4000 mg | SUBLINGUAL_TABLET | SUBLINGUAL | Status: DC | PRN
  Administered 2016-09-22: 0.4 mg via SUBLINGUAL
  Filled 2016-09-22: qty 1

## 2016-09-22 MED ORDER — CARVEDILOL 3.125 MG PO TABS
6.2500 mg | ORAL_TABLET | Freq: Two times a day (BID) | ORAL | Status: DC
  Administered 2016-09-22 – 2016-09-24 (×4): 6.25 mg via ORAL
  Filled 2016-09-22 (×4): qty 2

## 2016-09-22 MED ORDER — ALPRAZOLAM 0.5 MG PO TABS
0.5000 mg | ORAL_TABLET | Freq: Every day | ORAL | Status: AC
  Administered 2016-09-22: 0.5 mg via ORAL
  Filled 2016-09-22: qty 1

## 2016-09-22 NOTE — Progress Notes (Signed)
Echo reviewed, overall stable LVEF. Chart reviewed, full consult to follow with AM rounds tomorrow. I agree with transition to IV lasix today.    Carlyle Dolly MD

## 2016-09-22 NOTE — Progress Notes (Signed)
*  PRELIMINARY RESULTS* Echocardiogram 2D Echocardiogram has been performed.  Donna [name] 09/22/2016, 3:28 PM

## 2016-09-22 NOTE — Consult Note (Signed)
CARDIOLOGY CONSULT NOTE   Patient ID: KATLEEN CARRAWAY MRN: [id number] DOB/AGE: Jan 05, 1941 76 y.o.  Admit Date: 09/20/2016 Referring Physician: Sinda Du MD Primary Physician: Alonza Bogus, MD Consulting Cardiologist: Carlyle Dolly MD Primary Cardiologist:  Carlyle Dolly MD Reason for Consultation: CHF with hx of NICM  Clinical Summary MACIAH FEEBACK is a 76 y.o. female who is being seen today for the evaluation of CHF at the request of Dr. Luan Pulling. She has a history of NICM, most recent EF of 20-25% per echo with Grade II diastolic dysfunction, S/P BiV AICD,chronic fatigue and dizziness.  Other history includes HTN, Hyperlipidemia.   She was brought to the ER by her daughter after she began to complain of right hand weakness, and listing to the right.  On arrival to ER BP 128/83, HR 70 bpm, O2 Sat 95%. Labs did not reveal any significant abnormalities. Creatinine 0.72. Had carotid ultrasound which revealed < 50% stenosis. CT of the head was negative for acute intracranial pathology. CXR revealed pulmonary edema. EKG demonstrated AV pacing. She was treated with IV lasix 40 mg. I/O not documented fully. Wt is down from 113 lbs to 108 lbs.   This am she began to complain of chest tightness. Was given NTG sublingual with resolution of symptoms. Troponin was ordered 0.04 and 0.05 respectively. She is confused at baseline.  She has been seen by neurology on consultation and diagnosed with deep brain CVA. Due to chest pain and CHF, Dr. Luan Pulling wanted cardiology recommendations. She denies chest pain.    Allergies  Allergen Reactions  . Codeine Other (See Comments)    Reaction:  Racing heart   . Decongestant [Pseudoephedrine Hcl Er] Other (See Comments)    Reaction:  Racing heart     Medications Scheduled Medications: . amiodarone  100 mg Oral Q1200  . aspirin EC  81 mg Oral Daily  . atorvastatin  20 mg Oral q1800  . carvedilol  3.125 mg Oral BID  . clopidogrel  75 mg  Oral Daily  . furosemide  40 mg Intravenous Q12H  . heparin  5,000 Units Subcutaneous Q8H  . insulin aspart  0-15 Units Subcutaneous TID WC  . insulin aspart  0-5 Units Subcutaneous QHS  . levothyroxine  75 mcg Oral QAC breakfast  . linagliptin  5 mg Oral Daily  . losartan  12.5 mg Oral Daily  . omega-3 acid ethyl esters  1 g Oral Daily     Infusions:   PRN Medications:  acetaminophen, nitroGLYCERIN, senna-docusate   Past Medical History:  Diagnosis Date  . AICD (automatic cardioverter/defibrillator) present   . Arthritis    "fingers" (11/18/2014)  . Atrial flutter (HCC)    Versus ventricular tachycardia; treated with amiodarone  . Cardiomyopathy, nonischemic ([place])    Nl cors in [place]; CHF in 12/97; EF of 40% in 5/01 and 7/03, 25% in 9/04 and 4/08.  systolic murmur without valvular abnormalities by echo; refused automatic implantable cardiac defibrillator  . Cerebrovascular disease    Duplex study in 12/2006 shows atherosclerosis without focal stenosis  . CHF (congestive heart failure) ([place])   . Diabetes mellitus, type II ([place])    No insulin  . Gastroesophageal reflux disease   . Hyperlipidemia   . Hypertension   . Left bundle Mackinley Kiehn block   . Ventricular tachycardia ([place])    a. PMH it lists "atrial flutter versus ventricular tachycardia treated with amiodarone" - details of this are unclear. Notes from back to 2006 indicate  she has been maintained on amiodarone but do not describe further indication. Patient's sister reports pt was told she had skipped beats that could cause her to drop dead - Dr. Lattie Haw put her on amiodarone and recommended a defibrillator.    Past Surgical History:  Procedure Laterality Date  . ABDOMINAL HYSTERECTOMY  2006  . BI-VENTRICULAR IMPLANTABLE CARDIOVERTER DEFIBRILLATOR  (CRT-D)  11/18/2014  . COLONOSCOPY  2008  . EP IMPLANTABLE DEVICE N/A 11/18/2014   Procedure: BiV ICD Insertion CRT-D;  Surgeon: Evans Lance, MD;  Location: [facility];  Service: Cardiovascular;  Laterality: N/A;  . HERNIA REPAIR Right   . TONSILLECTOMY      Family History  Problem Relation Age of Onset  . Diabetes Mother   . Kidney disease Mother   . Heart failure Mother   . Stomach cancer Father   . Hypertension Father   . Diabetes Sister   . Seizures Sister   . Heart attack Brother   . Seizures Brother   . Dementia Neg Hx      Social History Ms. Harshfield reports that she has never smoked. She has never used smokeless tobacco. Ms. Swearengin reports that she does not drink alcohol.  Review of Systems Complete review of systems are found to be negative unless outlined in H&P above.  Physical Examination Blood pressure (!) 144/80, pulse 75, temperature 97.5 F (36.4 C), temperature source Axillary, resp. rate 20, height 5\' 1"  (1.549 m), weight 108 lb 3.2 oz (49.1 kg), SpO2 100 %.  Intake/Output Summary (Last 24 hours) at 09/22/16 1058 Last data filed at 09/22/16 1040  Gross per 24 hour  Intake                0 ml  Output              400 ml  Net             -400 ml    Telemetry: BiV Pacing   GEN: Pleasantly confused.  Frail  HEENT: Conjunctiva and lids normal, oropharynx clear with moist mucosa. Neck: Supple, no elevated JVP or carotid bruits, no thyromegaly. Lungs: Crackles in the bases.  Cardiac: Regular rate and rhythm, no significant systolic murmur, no pericardial rub. Abdomen: Soft, nontender, no hepatomegaly, bowel sounds present, no guarding or rebound. Extremities: No pitting edema, distal pulses 2+. Skin: Warm and dry. Musculoskeletal: No kyphosis. Neuropsychiatric: Alert and oriented x3, affect grossly appropriate.  Prior Cardiac Testing/Procedures  Echocardiogram 02/26/2016 Left ventricle: The cavity size was mildly dilated. Wall   thickness was increased in a pattern of mild LVH. Systolic   function was severely reduced. The estimated ejection fraction   was in the range of 20% to 25%. Diffuse  hypokinesis. Features are   consistent with a pseudonormal left ventricular filling pattern,   with concomitant abnormal relaxation and increased filling   pressure (grade 2 diastolic dysfunction). Doppler parameters are   consistent with high ventricular filling pressure. - Mitral valve: There was mild regurgitation. - Left atrium: The atrium was moderately to severely dilated. - Right atrium: The atrium was moderately dilated.   Pacemaker 09/12/2016 Conclusion   Normal remote reviewed. <1% AT/AF burden, FFRW. Abnormal thoracic impedance x 10days, enrolled in ICM clinic. 99%Bp.    Lab Results  Basic Metabolic Panel:  Recent Labs Lab 09/20/16 1035 09/20/16 1039  NA 138  --   K 4.0  --   CL 98*  --   CO2 31  --  GLUCOSE 96  --   BUN 17  --   CREATININE 0.72  --   CALCIUM 9.4  --   MG  --  2.3     CBC:  Recent Labs Lab 09/20/16 1035  WBC 6.2  HGB 12.9  HCT 40.6  MCV 100.0  PLT 172    Cardiac Enzymes:  Recent Labs Lab 09/22/16 0624 09/22/16 0945  TROPONINI 0.04* 0.05*     Radiology: Dg Chest 2 View  Result Date: 09/20/2016 CLINICAL DATA:  Dizziness and congestion since yesterday. EXAM: CHEST  2 VIEW COMPARISON:  PA and lateral chest 06/08/2016. Single-view of the chest 02/26/2016. FINDINGS: There is cardiomegaly and pulmonary edema with associated very small bilateral pleural effusions. Pacing device is in place. No pneumothorax. Aortic atherosclerosis is noted. IMPRESSION: Pulmonary edema with associated small bilateral pleural effusions. Atherosclerosis. Electronically Signed   By: Inge Rise M.D.   On: 09/20/2016 11:12   Ct Head Wo Contrast  Result Date: 09/20/2016 CLINICAL DATA:  Dizziness with unsteady gait EXAM: CT HEAD WITHOUT CONTRAST TECHNIQUE: Contiguous axial images were obtained from the base of the skull through the vertex without intravenous contrast. COMPARISON:  07/06/2016 FINDINGS: Brain: No evidence of acute infarction,  hemorrhage, extra-axial collection, ventriculomegaly, or mass effect. Bilateral frontal lobe extra-axial fluid isodense to CSF likely reflecting bilateral subdural hygromas. Small subcentimeter left parietal meningioma. Generalized cerebral atrophy. Periventricular white matter low attenuation likely secondary to microangiopathy. Vascular: Cerebrovascular atherosclerotic calcifications are noted. Skull: Negative for fracture or focal lesion. Sinuses/Orbits: Visualized portions of the orbits are unremarkable. Visualized portions of the paranasal sinuses and mastoid air cells are unremarkable. Other: None. IMPRESSION: 1. No acute intracranial pathology. 2. Bilateral subdural hygromas. Electronically Signed   By: Kathreen Devoid   On: 09/20/2016 11:33   US Carotid Bilateral (at [facility] Only)  Result Date: 09/21/2016 CLINICAL DATA:  CVA EXAM: BILATERAL CAROTID DUPLEX ULTRASOUND TECHNIQUE: Pearline Cables scale imaging, color Doppler and duplex ultrasound were performed of bilateral carotid and vertebral arteries in the neck. COMPARISON:  None. FINDINGS: Criteria: Quantification of carotid stenosis is based on velocity parameters that correlate the residual internal carotid diameter with NASCET-based stenosis levels, using the diameter of the distal internal carotid lumen as the denominator for stenosis measurement. The following velocity measurements were obtained: RIGHT ICA:  77 cm/sec CCA:  50 cm/sec SYSTOLIC ICA/CCA RATIO:  1.5 DIASTOLIC ICA/CCA RATIO:  2.8 ECA:  70 cm/sec LEFT ICA:  72 cm/sec CCA:  59 cm/sec SYSTOLIC ICA/CCA RATIO:  1.2 DIASTOLIC ICA/CCA RATIO:  2.4 ECA:  56 cm/sec RIGHT CAROTID ARTERY: Mild calcified plaque in the bulb. Low resistance internal carotid Doppler pattern RIGHT VERTEBRAL ARTERY:  Antegrade LEFT CAROTID ARTERY: Minimal scattered calcified plaque in the upper common carotid. Mild calcified plaque in the bulb. Low resistance internal carotid Doppler pattern. LEFT VERTEBRAL ARTERY:  Antegrade.  IMPRESSION: Less than 50% stenosis in the right and left internal carotid arteries. Electronically Signed   By: Marybelle Killings M.D.   On: 09/21/2016 09:16   Dg Chest Port 1 View  Result Date: 09/22/2016 CLINICAL DATA:  Shortness of breath, history of CHF, diabetes, cardiomyopathy, peripheral vascular disease. EXAM: PORTABLE CHEST 1 VIEW COMPARISON:  PA and lateral chest x-ray of September 20, 2016 FINDINGS: The lungs are adequately inflated. The interstitial markings remain mildly increased. The cardiac silhouette remains enlarged and the pulmonary vascularity engorged. Previously demonstrated pleural effusions have not increased in volume. The ICD is in stable position. There is calcification in the  wall of the aortic arch. The bony thorax exhibits no acute abnormality. IMPRESSION: CHF with mild pulmonary interstitial edema slightly improved since the earlier study. Thoracic aortic atherosclerosis. Electronically Signed   By: David  Martinique M.D.   On: 09/22/2016 07:46     ECG: BiV pacing.    Impression and Recommendations   1. Acute on Chronic Mixed CHF: Pulmonary edema on CXR. She has been diuresing but there has been no accuracy in I/O documentation. Wt is down 7 lbs. She was placed back on IV lasix this am at 40 mg IV BID. Home dose of diuretic is torsemide 20 mg daily. May need higher dose at home and consider addition of metolazone. Creatinine 0.72  Strict I/O and daily wts. She is followed by [facility] at home.   2. NICM: On coreg 3.125 mg BID, will increase to 6.25 mg BID as BP is not optimal for her EF at this time. Continue ARB. No plans for ischemic testing.   3. Hx of VT: Continues on amiodarone.   4. Acute CVA: She remains on DAPT with Plavix and ASA.   5. Diabetes: Per PCP.   6. Thyroid disease: On levothyroxine. With amiodarone dosing. Will check TSH.   Signed: Phill Myron. Lawrence NP [place]  09/22/2016, 10:58 AM Co-Sign MD  Patient seen and discussed with NP Lawerence, I agree with her  documentation above. 76 yo female history of NICM LVEF 20-25%, VT on amiodarone admitted with suspected CVA. Cardiology is consulted for increased SOB this admission. CXR yesterday showed interstitial edema, she was changed to IV lasix. She is on lasix 40mg  IV bid, I/Os are incomplete. Labs are pending this AM. Echo this admit shows overall stable LVEF. She reports her breathing has improved with IV lasix. By exam she appears euvolemic. Will receive lasix IV x 1 more dose today, change back to oral starting tomorrow   Carlyle Dolly MD

## 2016-09-22 NOTE — Progress Notes (Signed)
Pt c/o SOB and feeling nervous, she states maybe she is excited about going home but feel like she is having a hard time breathing. O2 stats 97% on 2L and lung sounds rhonchi with expiratory wheezes. MD paged, will continue to monitor.

## 2016-09-22 NOTE — Care Management Note (Signed)
Case Management Note  Patient Details  Name: Donna [name] MRN: [id number] Date of Birth: Dec 06, 1940  Subjective/Objective:                  Pt admitted with CVA. She is from home, has grandson who lives with her and daughter who manages her affairs. She is ind with bathing and dressing. Her grandson prepares meals and takes her to appointments. She was recently DC'd from [facility] services. She will be re-referred and she would like [facility] to come again. She also qualifies for supplemental oxygen. She would like to use [facility] for DME needs. Pt/family aware that [facility] has 48hrs to make first visit. Grandson and daughter at bedside for DC planning.   Action/Plan: Romualdo Bolk, of [facility], aware of referral and will obtain pt info from chart. Port oxygen tank will be delivered to pt room prior to DC. DC anticipated 09/23/2016.  Expected Discharge Date:  09/22/16               Expected Discharge Plan:  [place]  In-House Referral:  NA  Discharge planning Services  CM Consult  Post Acute Care Choice:  Home Health, Durable Medical Equipment Choice offered to:  Patient  DME Arranged:  Oxygen DME Agency:  [place]:  RN, PT [facility] Agency:  [place]  Status of Service:  Completed, signed off  Sherald Barge, RN 09/22/2016, 12:53 PM

## 2016-09-22 NOTE — Progress Notes (Signed)
Called Dr. Anastasio Champion, On-call MD for Luan Pulling.  Patient's family asked if she could have her normal meds that she uses for anxiety and sleep.  Daughter states that the patient usually takes 0.5 Alprazolam at night and occasionally for anxiety.  Dr. Anastasio Champion gave verbal order for one dose.  Dr. Luan Pulling can address additional meds in the morning.

## 2016-09-22 NOTE — Progress Notes (Signed)
Subjective: She is confused this morning. She complained of chest tightness last night. EKG didn't show any acute changes does show paced rhythm. She took a nitroglycerin and that helped some and she has a headache from that. Chest x-ray done last night that I personally reviewed shows chronic interstitial changes and some question of some volume overload. EKG which I personally reviewed doesn't show any acute changes. She's not having any chest tightness now. She says she is still a little short of breath. As mentioned she is mildly confused. Troponin level was slightly elevated  Objective: Vital signs in last 24 hours: Temp:  [97.5 F (36.4 C)-98.5 F (36.9 C)] 97.5 F (36.4 C) (04/19 0415) Pulse Rate:  [64-75] 75 (04/19 0415) Resp:  [20] 20 (04/19 0415) BP: (118-144)/(60-80) 144/80 (04/19 0415) SpO2:  [72 %-100 %] 100 % (04/19 0415) Weight change:  Last BM Date: 09/20/16  Intake/Output from previous day: No intake/output data recorded.  PHYSICAL EXAM General appearance: alert, cooperative and mild distress Resp: rhonchi bilaterally Cardio: Her heart is regular and she has a prominent S3 gallop today GI: soft, non-tender; bowel sounds normal; no masses,  no organomegaly Extremities: extremities normal, atraumatic, no cyanosis or edema Skin warm and dry. Mildly confused  Lab Results:  Results for orders placed or performed during the hospital encounter of 09/20/16 (from the past 48 hour(s))  Urinalysis, Routine w reflex microscopic     Status: Abnormal   Collection Time: 09/20/16 10:22 AM  Result Value Ref Range   Color, Urine COLORLESS (A) YELLOW   APPearance CLEAR CLEAR   Specific Gravity, Urine 1.005 1.005 - 1.030   pH 7.0 5.0 - 8.0   Glucose, UA NEGATIVE NEGATIVE mg/dL   Hgb urine dipstick NEGATIVE NEGATIVE   Bilirubin Urine NEGATIVE NEGATIVE   Ketones, ur NEGATIVE NEGATIVE mg/dL   Protein, ur NEGATIVE NEGATIVE mg/dL   Nitrite NEGATIVE NEGATIVE   Leukocytes, UA  NEGATIVE NEGATIVE  CBG monitoring, ED     Status: None   Collection Time: 09/20/16 10:22 AM  Result Value Ref Range   Glucose-Capillary 74 65 - 99 mg/dL  Basic metabolic panel     Status: Abnormal   Collection Time: 09/20/16 10:35 AM  Result Value Ref Range   Sodium 138 135 - 145 mmol/L   Potassium 4.0 3.5 - 5.1 mmol/L   Chloride 98 (L) 101 - 111 mmol/L   CO2 31 22 - 32 mmol/L   Glucose, Bld 96 65 - 99 mg/dL   BUN 17 6 - 20 mg/dL   Creatinine, Ser 0.72 0.44 - 1.00 mg/dL   Calcium 9.4 8.9 - 10.3 mg/dL   GFR calc non Af Amer >60 >60 mL/min   GFR calc Af Amer >60 >60 mL/min    Comment: (NOTE) The eGFR has been calculated using the CKD EPI equation. This calculation has not been validated in all clinical situations. eGFR's persistently <60 mL/min signify possible Chronic Kidney Disease.    Anion gap 9 5 - 15  CBC     Status: None   Collection Time: 09/20/16 10:35 AM  Result Value Ref Range   WBC 6.2 4.0 - 10.5 K/uL   RBC 4.06 3.87 - 5.11 MIL/uL   Hemoglobin 12.9 12.0 - 15.0 g/dL   HCT 40.6 36.0 - 46.0 %   MCV 100.0 78.0 - 100.0 fL   MCH 31.8 26.0 - 34.0 pg   MCHC 31.8 30.0 - 36.0 g/dL   RDW 15.1 11.5 - 15.5 %   Platelets  172 150 - 400 K/uL  Magnesium     Status: None   Collection Time: 09/20/16 10:39 AM  Result Value Ref Range   Magnesium 2.3 1.7 - 2.4 mg/dL  Glucose, capillary     Status: Abnormal   Collection Time: 09/20/16  5:06 PM  Result Value Ref Range   Glucose-Capillary 102 (H) 65 - 99 mg/dL  Glucose, capillary     Status: Abnormal   Collection Time: 09/20/16  8:37 PM  Result Value Ref Range   Glucose-Capillary 157 (H) 65 - 99 mg/dL   Comment 1 Notify RN    Comment 2 Document in Chart   Hemoglobin A1c     Status: Abnormal   Collection Time: 09/21/16  5:13 AM  Result Value Ref Range   Hgb A1c MFr Bld 5.7 (H) 4.8 - 5.6 %    Comment: (NOTE)         Pre-diabetes: 5.7 - 6.4         Diabetes: >6.4         Glycemic control for adults with diabetes: <7.0     Mean Plasma Glucose 117 mg/dL    Comment: (NOTE) Performed At: [facility] [place], [place] [id number] Lindon Romp MD [id number]   Lipid panel     Status: Abnormal   Collection Time: 09/21/16  5:13 AM  Result Value Ref Range   Cholesterol 99 0 - 200 mg/dL   Triglycerides 100 <150 mg/dL   HDL 33 (L) >40 mg/dL   Total CHOL/HDL Ratio 3.0 RATIO   VLDL 20 0 - 40 mg/dL   LDL Cholesterol 46 0 - 99 mg/dL    Comment:        Total Cholesterol/HDL:CHD Risk Coronary Heart Disease Risk Table                     Men   Women  1/2 Average Risk   3.4   3.3  Average Risk       5.0   4.4  2 X Average Risk   9.6   7.1  3 X Average Risk  23.4   11.0        Use the calculated Patient Ratio above and the CHD Risk Table to determine the patient's CHD Risk.        ATP III CLASSIFICATION (LDL):  <100     mg/dL   Optimal  100-129  mg/dL   Near or Above                    Optimal  130-159  mg/dL   Borderline  160-189  mg/dL   High  >190     mg/dL   Very High   Glucose, capillary     Status: Abnormal   Collection Time: 09/21/16  7:52 AM  Result Value Ref Range   Glucose-Capillary 120 (H) 65 - 99 mg/dL   Comment 1 Notify RN    Comment 2 Document in Chart   Glucose, capillary     Status: Abnormal   Collection Time: 09/21/16 11:50 AM  Result Value Ref Range   Glucose-Capillary 140 (H) 65 - 99 mg/dL   Comment 1 Notify RN    Comment 2 Document in Chart   Glucose, capillary     Status: Abnormal   Collection Time: 09/21/16  4:32 PM  Result Value Ref Range   Glucose-Capillary 146 (H) 65 - 99 mg/dL   Comment 1 Notify RN  Comment 2 Document in Chart   Glucose, capillary     Status: Abnormal   Collection Time: 09/21/16  8:56 PM  Result Value Ref Range   Glucose-Capillary 172 (H) 65 - 99 mg/dL   Comment 1 Notify RN    Comment 2 Document in Chart   Troponin I     Status: Abnormal   Collection Time: 09/22/16  6:24 AM  Result Value Ref Range   Troponin I 0.04 (HH)  <0.03 ng/mL    Comment: CRITICAL RESULT CALLED TO, READ BACK BY AND VERIFIED WITH: RUSSELL,L AT 7:10AM ON 09/22/16 BY FESTERMAN,C   Glucose, capillary     Status: Abnormal   Collection Time: 09/22/16  7:28 AM  Result Value Ref Range   Glucose-Capillary 161 (H) 65 - 99 mg/dL   Comment 1 Notify RN    Comment 2 Document in Chart     ABGS No results for input(s): PHART, PO2ART, TCO2, HCO3 in the last 72 hours.  Invalid input(s): PCO2 CULTURES No results found for this or any previous visit (from the past 240 hour(s)). Studies/Results: Dg Chest 2 View  Result Date: 09/20/2016 CLINICAL DATA:  Dizziness and congestion since yesterday. EXAM: CHEST  2 VIEW COMPARISON:  PA and lateral chest 06/08/2016. Single-view of the chest 02/26/2016. FINDINGS: There is cardiomegaly and pulmonary edema with associated very small bilateral pleural effusions. Pacing device is in place. No pneumothorax. Aortic atherosclerosis is noted. IMPRESSION: Pulmonary edema with associated small bilateral pleural effusions. Atherosclerosis. Electronically Signed   By: Inge Rise M.D.   On: 09/20/2016 11:12   Ct Head Wo Contrast  Result Date: 09/20/2016 CLINICAL DATA:  Dizziness with unsteady gait EXAM: CT HEAD WITHOUT CONTRAST TECHNIQUE: Contiguous axial images were obtained from the base of the skull through the vertex without intravenous contrast. COMPARISON:  07/06/2016 FINDINGS: Brain: No evidence of acute infarction, hemorrhage, extra-axial collection, ventriculomegaly, or mass effect. Bilateral frontal lobe extra-axial fluid isodense to CSF likely reflecting bilateral subdural hygromas. Small subcentimeter left parietal meningioma. Generalized cerebral atrophy. Periventricular white matter low attenuation likely secondary to microangiopathy. Vascular: Cerebrovascular atherosclerotic calcifications are noted. Skull: Negative for fracture or focal lesion. Sinuses/Orbits: Visualized portions of the orbits are  unremarkable. Visualized portions of the paranasal sinuses and mastoid air cells are unremarkable. Other: None. IMPRESSION: 1. No acute intracranial pathology. 2. Bilateral subdural hygromas. Electronically Signed   By: Kathreen Devoid   On: 09/20/2016 11:33   US Carotid Bilateral (at [facility] Only)  Result Date: 09/21/2016 CLINICAL DATA:  CVA EXAM: BILATERAL CAROTID DUPLEX ULTRASOUND TECHNIQUE: Pearline Cables scale imaging, color Doppler and duplex ultrasound were performed of bilateral carotid and vertebral arteries in the neck. COMPARISON:  None. FINDINGS: Criteria: Quantification of carotid stenosis is based on velocity parameters that correlate the residual internal carotid diameter with NASCET-based stenosis levels, using the diameter of the distal internal carotid lumen as the denominator for stenosis measurement. The following velocity measurements were obtained: RIGHT ICA:  77 cm/sec CCA:  50 cm/sec SYSTOLIC ICA/CCA RATIO:  1.5 DIASTOLIC ICA/CCA RATIO:  2.8 ECA:  70 cm/sec LEFT ICA:  72 cm/sec CCA:  59 cm/sec SYSTOLIC ICA/CCA RATIO:  1.2 DIASTOLIC ICA/CCA RATIO:  2.4 ECA:  56 cm/sec RIGHT CAROTID ARTERY: Mild calcified plaque in the bulb. Low resistance internal carotid Doppler pattern RIGHT VERTEBRAL ARTERY:  Antegrade LEFT CAROTID ARTERY: Minimal scattered calcified plaque in the upper common carotid. Mild calcified plaque in the bulb. Low resistance internal carotid Doppler pattern. LEFT VERTEBRAL ARTERY:  Antegrade.  IMPRESSION: Less than 50% stenosis in the right and left internal carotid arteries. Electronically Signed   By: Marybelle Killings M.D.   On: 09/21/2016 09:16   Dg Chest Port 1 View  Result Date: 09/22/2016 CLINICAL DATA:  Shortness of breath, history of CHF, diabetes, cardiomyopathy, peripheral vascular disease. EXAM: PORTABLE CHEST 1 VIEW COMPARISON:  PA and lateral chest x-ray of September 20, 2016 FINDINGS: The lungs are adequately inflated. The interstitial markings remain mildly increased. The  cardiac silhouette remains enlarged and the pulmonary vascularity engorged. Previously demonstrated pleural effusions have not increased in volume. The ICD is in stable position. There is calcification in the wall of the aortic arch. The bony thorax exhibits no acute abnormality. IMPRESSION: CHF with mild pulmonary interstitial edema slightly improved since the earlier study. Thoracic aortic atherosclerosis. Electronically Signed   By: David  Martinique M.D.   On: 09/22/2016 07:46    Medications:  Prior to Admission:  Prescriptions Prior to Admission  Medication Sig Dispense Refill Last Dose  . acetaminophen (TYLENOL) 500 MG tablet Take 500 mg by mouth every 6 (six) hours as needed.   unknown  . amiodarone (PACERONE) 200 MG tablet Take 100 mg by mouth daily at 12 noon.   09/19/2016 at Unknown time  . aspirin 81 MG tablet Take 81 mg by mouth daily.   09/20/2016 at Unknown time  . atorvastatin (LIPITOR) 20 MG tablet Take 1 tablet (20 mg total) by mouth daily at 6 PM. 30 tablet 12 09/19/2016 at Unknown time  . Carbamide Peroxide (EAR WAX DROPS OT) Place 1-2 drops in ear(s) daily as needed (wax build up).   09/20/2016 at Unknown time  . carvedilol (COREG) 3.125 MG tablet Take 1 tablet (3.125 mg total) by mouth 2 (two) times daily. 60 tablet 11 09/20/2016 at 0830  . insulin detemir (LEVEMIR) 100 UNIT/ML injection Inject 12 Units into the skin at bedtime.   09/19/2016 at Unknown time  . IRON PO Take 1 tablet by mouth daily.   09/20/2016 at Unknown time  . levothyroxine (SYNTHROID, LEVOTHROID) 75 MCG tablet Take 75 mcg by mouth daily before breakfast.   09/20/2016 at Unknown time  . loratadine (CLARITIN) 10 MG tablet Take 10 mg by mouth daily as needed for allergies.   09/20/2016 at Unknown time  . losartan (COZAAR) 25 MG tablet Take 0.5 tablets (12.5 mg total) by mouth daily. 90 tablet 3 09/20/2016 at Unknown time  . Omega-3 Fatty Acids (FISH OIL PO) Take by mouth 3 (three) times daily.   09/20/2016 at Unknown time  .  potassium chloride SA (KLOR-CON M20) 20 MEQ tablet Take 2 Tablets In The Morning And Take 1 Tablet In The Evening 90 tablet 3 09/20/2016 at Unknown time  . sitaGLIPtin (JANUVIA) 100 MG tablet Take 100 mg by mouth daily.   09/20/2016 at Unknown time  . torsemide (DEMADEX) 20 MG tablet Take 1 tablet (20 mg total) by mouth daily. 180 tablet 3 09/20/2016 at Unknown time  . UNIFINE PENTIPS 32G X 4 MM MISC USE TWICE A DAY TO TAKE INSULIN. 100 each 2 Taking   Scheduled: . amiodarone  100 mg Oral Q1200  . aspirin EC  81 mg Oral Daily  . atorvastatin  20 mg Oral q1800  . carvedilol  3.125 mg Oral BID  . clopidogrel  75 mg Oral Daily  . furosemide  40 mg Intravenous Q12H  . heparin  5,000 Units Subcutaneous Q8H  . insulin aspart  0-15 Units Subcutaneous TID WC  .  insulin aspart  0-5 Units Subcutaneous QHS  . levothyroxine  75 mcg Oral QAC breakfast  . linagliptin  5 mg Oral Daily  . losartan  12.5 mg Oral Daily  . omega-3 acid ethyl esters  1 g Oral Daily   Continuous:  GYF:[id number], nitroGLYCERIN, senna-docusate  Assesment: She was admitted with acute neurological event. She has been seen by neurology and is felt to have had a deep brain stroke. She has been having increasing problems with confusion over the last 6 months or so. She is confused this morning. Last night she had chest tightness and was given nitroglycerin which seemed to help. She is confused about the events. Chest x-ray shows chronic interstitial changes and some question of pulmonary edema that's read as being somewhat better than on admission. However she has a prominent S3 gallop and was having chest tightness on going to switch her from her current oral torsemide to at least 1 day of IV Lasix. She is known to have pretty severe heart failure. In the past she has not got swelling of her legs so much as she has pulmonary issues. Active Problems:   Cerebrovascular disease   AICD (automatic cardioverter/defibrillator) present    CHF (congestive heart failure) ([place])   Acute CVA (cerebrovascular accident) ([place])    Plan: Continue treatments. Switch to IV Lasix at least for 24 hours. Request cardiology consultation.    LOS: 2 days   Nickalas Mccarrick L 09/22/2016, 8:31 AM

## 2016-09-23 ENCOUNTER — Inpatient Hospital Stay (HOSPITAL_COMMUNITY): Payer: Medicare HMO

## 2016-09-23 LAB — BASIC METABOLIC PANEL
Anion gap: 10 (ref 5–15)
BUN: 18 mg/dL (ref 6–20)
CO2: 34 mmol/L — ABNORMAL HIGH (ref 22–32)
CREATININE: 0.67 mg/dL (ref 0.44–1.00)
Calcium: 8.7 mg/dL — ABNORMAL LOW (ref 8.9–10.3)
Chloride: 94 mmol/L — ABNORMAL LOW (ref 101–111)
Glucose, Bld: 161 mg/dL — ABNORMAL HIGH (ref 65–99)
Potassium: 2.5 mmol/L — CL (ref 3.5–5.1)
SODIUM: 138 mmol/L (ref 135–145)

## 2016-09-23 LAB — GLUCOSE, CAPILLARY
GLUCOSE-CAPILLARY: 152 mg/dL — AB (ref 65–99)
GLUCOSE-CAPILLARY: 156 mg/dL — AB (ref 65–99)
GLUCOSE-CAPILLARY: 140 mg/dL — AB (ref 65–99)
Glucose-Capillary: 203 mg/dL — ABNORMAL HIGH (ref 65–99)

## 2016-09-23 LAB — MAGNESIUM: MAGNESIUM: 2 mg/dL (ref 1.7–2.4)

## 2016-09-23 MED ORDER — ALPRAZOLAM 0.5 MG PO TABS
0.5000 mg | ORAL_TABLET | Freq: Every day | ORAL | Status: AC
  Administered 2016-09-23: 0.5 mg via ORAL
  Filled 2016-09-23: qty 1

## 2016-09-23 MED ORDER — TORSEMIDE 20 MG PO TABS
20.0000 mg | ORAL_TABLET | Freq: Every day | ORAL | Status: DC
  Administered 2016-09-24: 20 mg via ORAL
  Filled 2016-09-23: qty 1

## 2016-09-23 MED ORDER — POTASSIUM CHLORIDE CRYS ER 20 MEQ PO TBCR
40.0000 meq | EXTENDED_RELEASE_TABLET | Freq: Two times a day (BID) | ORAL | Status: DC
  Administered 2016-09-23 – 2016-09-24 (×3): 40 meq via ORAL
  Filled 2016-09-23 (×3): qty 2

## 2016-09-23 NOTE — Progress Notes (Deleted)
Patient states she has oxygen at home. Explained to patient that she will have to have family bring oxygen tank from home to hospital before she can be discharged. Verbalized understanding.

## 2016-09-23 NOTE — Progress Notes (Signed)
Subjective: She says she feels substantially better. She received IV Lasix yesterday. She needs to get her medications adjusted so I will do that. She has no new complaints. No chest pain.  Objective: Vital signs in last 24 hours: Temp:  [97.7 F (36.5 C)-98 F (36.7 C)] 97.7 F (36.5 C) (04/20 0515) Pulse Rate:  [59-74] 65 (04/20 0515) Resp:  [18-20] 18 (04/20 0515) BP: (119-128)/(60-71) 119/60 (04/20 0515) SpO2:  [85 %-100 %] 100 % (04/20 0515) Weight change:  Last BM Date: 09/20/16  Intake/Output from previous day: 04/19 0701 - 04/20 0700 In: 720 [P.O.:720] Out: 1000 [Urine:1000]  PHYSICAL EXAM General appearance: alert, cooperative and Still with some confusion Resp: rales bilaterally Cardio: Her heart is regular at this point she does not have an S3 gallop this morning GI: soft, non-tender; bowel sounds normal; no masses,  no organomegaly Extremities: extremities normal, atraumatic, no cyanosis or edema Skin warm and dry.  Lab Results:  Results for orders placed or performed during the hospital encounter of 09/20/16 (from the past 48 hour(s))  Glucose, capillary     Status: Abnormal   Collection Time: 09/21/16 11:50 AM  Result Value Ref Range   Glucose-Capillary 140 (H) 65 - 99 mg/dL   Comment 1 Notify RN    Comment 2 Document in Chart   Glucose, capillary     Status: Abnormal   Collection Time: 09/21/16  4:32 PM  Result Value Ref Range   Glucose-Capillary 146 (H) 65 - 99 mg/dL   Comment 1 Notify RN    Comment 2 Document in Chart   Glucose, capillary     Status: Abnormal   Collection Time: 09/21/16  8:56 PM  Result Value Ref Range   Glucose-Capillary 172 (H) 65 - 99 mg/dL   Comment 1 Notify RN    Comment 2 Document in Chart   Troponin I     Status: Abnormal   Collection Time: 09/22/16  6:24 AM  Result Value Ref Range   Troponin I 0.04 (HH) <0.03 ng/mL    Comment: CRITICAL RESULT CALLED TO, READ BACK BY AND VERIFIED WITH: RUSSELL,L AT 7:10AM ON 09/22/16 BY  FESTERMAN,C   Glucose, capillary     Status: Abnormal   Collection Time: 09/22/16  7:28 AM  Result Value Ref Range   Glucose-Capillary 161 (H) 65 - 99 mg/dL   Comment 1 Notify RN    Comment 2 Document in Chart   Troponin I     Status: Abnormal   Collection Time: 09/22/16  9:45 AM  Result Value Ref Range   Troponin I 0.05 (HH) <0.03 ng/mL    Comment: CRITICAL RESULT CALLED TO, READ BACK BY AND VERIFIED WITH: RUSSELL,L AT 10:00AM ON 09/22/16 BY FESTERMAN,C   Glucose, capillary     Status: Abnormal   Collection Time: 09/22/16 11:14 AM  Result Value Ref Range   Glucose-Capillary 168 (H) 65 - 99 mg/dL   Comment 1 Notify RN    Comment 2 Document in Chart   Troponin I     Status: Abnormal   Collection Time: 09/22/16  3:33 PM  Result Value Ref Range   Troponin I 0.04 (HH) <0.03 ng/mL    Comment: CRITICAL VALUE NOTED.  VALUE IS CONSISTENT WITH PREVIOUSLY REPORTED AND CALLED VALUE.  Glucose, capillary     Status: Abnormal   Collection Time: 09/22/16  4:06 PM  Result Value Ref Range   Glucose-Capillary 152 (H) 65 - 99 mg/dL   Comment 1 Notify RN  Comment 2 Document in Chart   Glucose, capillary     Status: Abnormal   Collection Time: 09/22/16  8:52 PM  Result Value Ref Range   Glucose-Capillary 151 (H) 65 - 99 mg/dL   Comment 1 Notify RN    Comment 2 Document in Chart   Glucose, capillary     Status: Abnormal   Collection Time: 09/23/16  7:38 AM  Result Value Ref Range   Glucose-Capillary 152 (H) 65 - 99 mg/dL   Comment 1 Notify RN    Comment 2 Document in Chart     ABGS No results for input(s): PHART, PO2ART, TCO2, HCO3 in the last 72 hours.  Invalid input(s): PCO2 CULTURES No results found for this or any previous visit (from the past 240 hour(s)). Studies/Results: US Carotid Bilateral (at [facility] Only)  Result Date: 09/21/2016 CLINICAL DATA:  CVA EXAM: BILATERAL CAROTID DUPLEX ULTRASOUND TECHNIQUE: Pearline Cables scale imaging, color Doppler and duplex ultrasound were  performed of bilateral carotid and vertebral arteries in the neck. COMPARISON:  None. FINDINGS: Criteria: Quantification of carotid stenosis is based on velocity parameters that correlate the residual internal carotid diameter with NASCET-based stenosis levels, using the diameter of the distal internal carotid lumen as the denominator for stenosis measurement. The following velocity measurements were obtained: RIGHT ICA:  77 cm/sec CCA:  50 cm/sec SYSTOLIC ICA/CCA RATIO:  1.5 DIASTOLIC ICA/CCA RATIO:  2.8 ECA:  70 cm/sec LEFT ICA:  72 cm/sec CCA:  59 cm/sec SYSTOLIC ICA/CCA RATIO:  1.2 DIASTOLIC ICA/CCA RATIO:  2.4 ECA:  56 cm/sec RIGHT CAROTID ARTERY: Mild calcified plaque in the bulb. Low resistance internal carotid Doppler pattern RIGHT VERTEBRAL ARTERY:  Antegrade LEFT CAROTID ARTERY: Minimal scattered calcified plaque in the upper common carotid. Mild calcified plaque in the bulb. Low resistance internal carotid Doppler pattern. LEFT VERTEBRAL ARTERY:  Antegrade. IMPRESSION: Less than 50% stenosis in the right and left internal carotid arteries. Electronically Signed   By: Marybelle Killings M.D.   On: 09/21/2016 09:16   Dg Chest Port 1 View  Result Date: 09/22/2016 CLINICAL DATA:  Shortness of breath, history of CHF, diabetes, cardiomyopathy, peripheral vascular disease. EXAM: PORTABLE CHEST 1 VIEW COMPARISON:  PA and lateral chest x-ray of September 20, 2016 FINDINGS: The lungs are adequately inflated. The interstitial markings remain mildly increased. The cardiac silhouette remains enlarged and the pulmonary vascularity engorged. Previously demonstrated pleural effusions have not increased in volume. The ICD is in stable position. There is calcification in the wall of the aortic arch. The bony thorax exhibits no acute abnormality. IMPRESSION: CHF with mild pulmonary interstitial edema slightly improved since the earlier study. Thoracic aortic atherosclerosis. Electronically Signed   By: David  Martinique M.D.   On:  09/22/2016 07:46    Medications:  Prior to Admission:  Prescriptions Prior to Admission  Medication Sig Dispense Refill Last Dose  . acetaminophen (TYLENOL) 500 MG tablet Take 500 mg by mouth every 6 (six) hours as needed.   unknown  . amiodarone (PACERONE) 200 MG tablet Take 100 mg by mouth daily at 12 noon.   09/19/2016 at Unknown time  . aspirin 81 MG tablet Take 81 mg by mouth daily.   09/20/2016 at Unknown time  . atorvastatin (LIPITOR) 20 MG tablet Take 1 tablet (20 mg total) by mouth daily at 6 PM. 30 tablet 12 09/19/2016 at Unknown time  . Carbamide Peroxide (EAR WAX DROPS OT) Place 1-2 drops in ear(s) daily as needed (wax build up).   09/20/2016  at Unknown time  . carvedilol (COREG) 3.125 MG tablet Take 1 tablet (3.125 mg total) by mouth 2 (two) times daily. 60 tablet 11 09/20/2016 at 0830  . insulin detemir (LEVEMIR) 100 UNIT/ML injection Inject 12 Units into the skin at bedtime.   09/19/2016 at Unknown time  . IRON PO Take 1 tablet by mouth daily.   09/20/2016 at Unknown time  . levothyroxine (SYNTHROID, LEVOTHROID) 75 MCG tablet Take 75 mcg by mouth daily before breakfast.   09/20/2016 at Unknown time  . loratadine (CLARITIN) 10 MG tablet Take 10 mg by mouth daily as needed for allergies.   09/20/2016 at Unknown time  . losartan (COZAAR) 25 MG tablet Take 0.5 tablets (12.5 mg total) by mouth daily. 90 tablet 3 09/20/2016 at Unknown time  . Omega-3 Fatty Acids (FISH OIL PO) Take by mouth 3 (three) times daily.   09/20/2016 at Unknown time  . potassium chloride SA (KLOR-CON M20) 20 MEQ tablet Take 2 Tablets In The Morning And Take 1 Tablet In The Evening 90 tablet 3 09/20/2016 at Unknown time  . sitaGLIPtin (JANUVIA) 100 MG tablet Take 100 mg by mouth daily.   09/20/2016 at Unknown time  . torsemide (DEMADEX) 20 MG tablet Take 1 tablet (20 mg total) by mouth daily. 180 tablet 3 09/20/2016 at Unknown time  . UNIFINE PENTIPS 32G X 4 MM MISC USE TWICE A DAY TO TAKE INSULIN. 100 each 2 Taking    Scheduled: . amiodarone  100 mg Oral Q1200  . aspirin EC  81 mg Oral Daily  . atorvastatin  20 mg Oral q1800  . carvedilol  6.25 mg Oral BID  . clopidogrel  75 mg Oral Daily  . furosemide  40 mg Intravenous Q12H  . heparin  5,000 Units Subcutaneous Q8H  . insulin aspart  0-15 Units Subcutaneous TID WC  . insulin aspart  0-5 Units Subcutaneous QHS  . levothyroxine  75 mcg Oral QAC breakfast  . linagliptin  5 mg Oral Daily  . losartan  12.5 mg Oral Daily  . omega-3 acid ethyl esters  1 g Oral Daily   Continuous:  QVZ:[id number], nitroGLYCERIN, senna-docusate  Assesment: She was admitted with a stroke. She seems to have recovered from that. She had more trouble with shortness of breath and chest tightness and has been treated for heart failure. She has nonischemic cardiomyopathy at baseline with pretty severe reduction in cardiac ejection fraction. Her cardiac ejection fraction was between 15 and 20% on echocardiogram done yesterday She has AICD in place. Additionally however her chest x-ray is suggestive that she might have interstitial pulmonary disease. She is on amiodarone. However she also has a history of ventricular tachycardia. Active Problems:   Cerebrovascular disease   AICD (automatic cardioverter/defibrillator) present   CHF (congestive heart failure) ([facility])   Acute CVA (cerebrovascular accident) [facility])    Plan: Plan for high-resolution CT chest today. Discuss with cardiology regarding whether she can safely come off amiodarone or not. Adjust her other medications. See if we can transition her to back to oral diuresis.    LOS: 3 days   Del Overfelt L 09/23/2016, 8:20 AM

## 2016-09-23 NOTE — Progress Notes (Signed)
Patient walked approximately 40 feet without oxygen on via nasal cannula. Patient's oxygen saturation did not drop below 99%.

## 2016-09-23 NOTE — Progress Notes (Signed)
Called MD regarding critical potassium value. MD said he will put in orders for potassium. MD is also [place] vitals and changing them to routine.

## 2016-09-24 LAB — BASIC METABOLIC PANEL
Anion gap: 7 (ref 5–15)
BUN: 17 mg/dL (ref 6–20)
CALCIUM: 8.6 mg/dL — AB (ref 8.9–10.3)
CO2: 31 mmol/L (ref 22–32)
CREATININE: 0.52 mg/dL (ref 0.44–1.00)
Chloride: 102 mmol/L (ref 101–111)
GFR calc non Af Amer: >60 mL/min (ref >60)
Glucose, Bld: 92 mg/dL (ref 65–99)
Potassium: 4.2 mmol/L (ref 3.5–5.1)
SODIUM: 140 mmol/L (ref 135–145)

## 2016-09-24 LAB — GLUCOSE, CAPILLARY
GLUCOSE-CAPILLARY: 90 mg/dL (ref 65–99)
Glucose-Capillary: 114 mg/dL — ABNORMAL HIGH (ref 65–99)

## 2016-09-24 MED ORDER — TORSEMIDE 20 MG PO TABS: 30.0000 mg | ORAL_TABLET | Freq: Every day | ORAL | 3 refills | Status: DC

## 2016-09-24 MED ORDER — ASPIRIN EC 325 MG PO TBEC: 325.0000 mg | DELAYED_RELEASE_TABLET | Freq: Every day | ORAL | Status: DC

## 2016-09-24 MED ORDER — CARVEDILOL 6.25 MG PO TABS: 6.2500 mg | ORAL_TABLET | Freq: Two times a day (BID) | ORAL | 12 refills | Status: DC

## 2016-09-24 MED ORDER — ASPIRIN 325 MG PO TBEC: 325.0000 mg | DELAYED_RELEASE_TABLET | Freq: Every day | ORAL | 0 refills | Status: DC

## 2016-09-24 NOTE — Discharge Summary (Signed)
Physician Discharge Summary  Patient ID: Donna [name] MRN: [id number] DOB/AGE: [birth year] 76 y.o. Primary Care Physician:Ary Lavine L, MD Admit date: 09/20/2016 Discharge date: 09/24/2016    Discharge Diagnoses:   Active Problems:   Left bundle branch block   GASTROESOPHAGEAL REFLUX DISEASE   Hypertension   Diabetes mellitus, type II ([place])   Acute on chronic systolic heart failure (HCC)   NICM (nonischemic cardiomyopathy) ([place])   Cerebrovascular disease   AICD (automatic cardioverter/defibrillator) present   CHF (congestive heart failure) ([place])   Acute CVA (cerebrovascular accident) ([place])   Allergies as of 09/24/2016      Reactions   Codeine Other (See Comments)   Reaction:  Racing heart    Decongestant [pseudoephedrine Hcl Er] Other (See Comments)   Reaction:  Racing heart       Medication List    STOP taking these medications   aspirin 81 MG tablet Replaced by:  aspirin 325 MG EC tablet     TAKE these medications   acetaminophen 500 MG tablet Commonly known as:  TYLENOL Take 500 mg by mouth every 6 (six) hours as needed.   amiodarone 200 MG tablet Commonly known as:  PACERONE Take 100 mg by mouth daily at 12 noon.   aspirin 325 MG EC tablet Take 1 tablet (325 mg total) by mouth daily. Start taking on:  09/25/2016 Replaces:  aspirin 81 MG tablet   atorvastatin 20 MG tablet Commonly known as:  LIPITOR Take 1 tablet (20 mg total) by mouth daily at 6 PM.   carvedilol 6.25 MG tablet Commonly known as:  COREG Take 1 tablet (6.25 mg total) by mouth 2 (two) times daily. What changed:  medication strength  how much to take   EAR WAX DROPS OT Place 1-2 drops in ear(s) daily as needed (wax build up).   FISH OIL PO Take by mouth 3 (three) times daily.   insulin detemir 100 UNIT/ML injection Commonly known as:  LEVEMIR Inject 12 Units into the skin at bedtime.   IRON PO Take 1 tablet by mouth daily.   levothyroxine 75 MCG tablet Commonly known  as:  SYNTHROID, LEVOTHROID Take 75 mcg by mouth daily before breakfast.   loratadine 10 MG tablet Commonly known as:  CLARITIN Take 10 mg by mouth daily as needed for allergies.   losartan 25 MG tablet Commonly known as:  COZAAR Take 0.5 tablets (12.5 mg total) by mouth daily.   potassium chloride SA 20 MEQ tablet Commonly known as:  KLOR-CON M20 Take 2 Tablets In The Morning And Take 1 Tablet In The Evening   sitaGLIPtin 100 MG tablet Commonly known as:  JANUVIA Take 100 mg by mouth daily.   torsemide 20 MG tablet Commonly known as:  DEMADEX Take 1.5 tablets (30 mg total) by mouth daily. What changed:  how much to take   UNIFINE PENTIPS 32G X 4 MM Misc Generic drug:  Insulin Pen Needle USE TWICE A DAY TO TAKE INSULIN.            Durable Medical Equipment        Start     Ordered   09/22/16 1155  For home use only DME oxygen  Once    Question Answer Comment  Mode or (Route) Nasal cannula   Liters per Minute 2   Frequency Continuous (stationary and portable oxygen unit needed)   Oxygen conserving device Yes   Oxygen delivery system Gas      09/22/16 1155  Discharged Condition:Improved    Consults: Cardiology/neurology  Significant Diagnostic Studies: Dg Chest 2 View  Result Date: 09/20/2016 CLINICAL DATA:  Dizziness and congestion since yesterday. EXAM: CHEST  2 VIEW COMPARISON:  PA and lateral chest 06/08/2016. Single-view of the chest 02/26/2016. FINDINGS: There is cardiomegaly and pulmonary edema with associated very small bilateral pleural effusions. Pacing device is in place. No pneumothorax. Aortic atherosclerosis is noted. IMPRESSION: Pulmonary edema with associated small bilateral pleural effusions. Atherosclerosis. Electronically Signed   By: Inge Rise M.D.   On: 09/20/2016 11:12   Ct Head Wo Contrast  Result Date: 09/20/2016 CLINICAL DATA:  Dizziness with unsteady gait EXAM: CT HEAD WITHOUT CONTRAST TECHNIQUE: Contiguous axial images  were obtained from the base of the skull through the vertex without intravenous contrast. COMPARISON:  07/06/2016 FINDINGS: Brain: No evidence of acute infarction, hemorrhage, extra-axial collection, ventriculomegaly, or mass effect. Bilateral frontal lobe extra-axial fluid isodense to CSF likely reflecting bilateral subdural hygromas. Small subcentimeter left parietal meningioma. Generalized cerebral atrophy. Periventricular white matter low attenuation likely secondary to microangiopathy. Vascular: Cerebrovascular atherosclerotic calcifications are noted. Skull: Negative for fracture or focal lesion. Sinuses/Orbits: Visualized portions of the orbits are unremarkable. Visualized portions of the paranasal sinuses and mastoid air cells are unremarkable. Other: None. IMPRESSION: 1. No acute intracranial pathology. 2. Bilateral subdural hygromas. Electronically Signed   By: Kathreen Devoid   On: 09/20/2016 11:33   Ct Chest High Resolution  Result Date: 09/23/2016 CLINICAL DATA:  Dyspnea. Chest tightness. Evaluate for interstitial lung disease. EXAM: CT CHEST WITHOUT CONTRAST TECHNIQUE: Multidetector CT imaging of the chest was performed following the standard protocol without intravenous contrast. High resolution imaging of the lungs, as well as inspiratory and expiratory imaging, was performed. COMPARISON:  Chest radiograph from one day prior. 05/16/2015 chest CT. FINDINGS: Cardiovascular: Cardiomegaly. Small to moderate pericardial effusion. Left main, left anterior descending, left circumflex and right coronary atherosclerosis. Atherosclerotic nonaneurysmal thoracic aorta. Normal caliber pulmonary arteries. Three lead left subclavian ICD with lead tips in the right atrium, right ventricular apex and coronary sinus. Mediastinum/Nodes: No discrete thyroid nodules. Unremarkable esophagus. No pathologically enlarged axillary, mediastinal or gross hilar lymph nodes, noting limited sensitivity for the detection of hilar  adenopathy on this noncontrast study. Lungs/Pleura: No pneumothorax. Small right and trace left dependent pleural effusions with associated mild compressive atelectasis in the dependent lower lobes. Subsegmental atelectasis in the right middle lobe and lingula. No acute consolidative airspace disease, lung masses or significant pulmonary nodules. Patchy ground-glass attenuation throughout both lungs. No significant interlobular septal thickening . No significant regions of subpleural reticulation, traction bronchiectasis, architectural distortion or frank honeycombing. No significant air trapping on the expiration sequence. Upper abdomen: Unremarkable. Musculoskeletal: No aggressive appearing focal osseous lesions. Mild T9 vertebral compression fracture, stable since 06/08/2016, new since 05/16/2015. Mild thoracic spondylosis. IMPRESSION: 1. Spectrum of findings of mild congestive heart failure including cardiomegaly, diffuse ground-glass attenuation compatible with mild pulmonary edema, and small right and trace left dependent pleural effusions. 2. Small to moderate pericardial effusion. 3. No evidence of interstitial lung disease. 4. Aortic atherosclerosis. Left main and 3 vessel coronary atherosclerosis. Electronically Signed   By: Ilona Sorrel M.D.   On: 09/23/2016 10:27   US Carotid Bilateral (at [facility] Only)  Result Date: 09/21/2016 CLINICAL DATA:  CVA EXAM: BILATERAL CAROTID DUPLEX ULTRASOUND TECHNIQUE: Pearline Cables scale imaging, color Doppler and duplex ultrasound were performed of bilateral carotid and vertebral arteries in the neck. COMPARISON:  None. FINDINGS: Criteria: Quantification of carotid stenosis is  based on velocity parameters that correlate the residual internal carotid diameter with NASCET-based stenosis levels, using the diameter of the distal internal carotid lumen as the denominator for stenosis measurement. The following velocity measurements were obtained: RIGHT ICA:  77 cm/sec CCA:  50  cm/sec SYSTOLIC ICA/CCA RATIO:  1.5 DIASTOLIC ICA/CCA RATIO:  2.8 ECA:  70 cm/sec LEFT ICA:  72 cm/sec CCA:  59 cm/sec SYSTOLIC ICA/CCA RATIO:  1.2 DIASTOLIC ICA/CCA RATIO:  2.4 ECA:  56 cm/sec RIGHT CAROTID ARTERY: Mild calcified plaque in the bulb. Low resistance internal carotid Doppler pattern RIGHT VERTEBRAL ARTERY:  Antegrade LEFT CAROTID ARTERY: Minimal scattered calcified plaque in the upper common carotid. Mild calcified plaque in the bulb. Low resistance internal carotid Doppler pattern. LEFT VERTEBRAL ARTERY:  Antegrade. IMPRESSION: Less than 50% stenosis in the right and left internal carotid arteries. Electronically Signed   By: Marybelle Killings M.D.   On: 09/21/2016 09:16   Dg Chest Port 1 View  Result Date: 09/22/2016 CLINICAL DATA:  Shortness of breath, history of CHF, diabetes, cardiomyopathy, peripheral vascular disease. EXAM: PORTABLE CHEST 1 VIEW COMPARISON:  PA and lateral chest x-ray of September 20, 2016 FINDINGS: The lungs are adequately inflated. The interstitial markings remain mildly increased. The cardiac silhouette remains enlarged and the pulmonary vascularity engorged. Previously demonstrated pleural effusions have not increased in volume. The ICD is in stable position. There is calcification in the wall of the aortic arch. The bony thorax exhibits no acute abnormality. IMPRESSION: CHF with mild pulmonary interstitial edema slightly improved since the earlier study. Thoracic aortic atherosclerosis. Electronically Signed   By: David  Martinique M.D.   On: 09/22/2016 07:46    Lab Results: Basic Metabolic Panel:  Recent Labs  09/23/16 1109 09/24/16 0619  NA 138 140  K 2.5* 4.2  CL 94* 102  CO2 34* 31  GLUCOSE 161* 92  BUN 18 17  CREATININE 0.67 0.52  CALCIUM 8.7* 8.6*  MG 2.0  --    Liver Function Tests: No results for input(s): AST, ALT, ALKPHOS, BILITOT, PROT, ALBUMIN in the last 72 hours.   CBC: No results for input(s): WBC, NEUTROABS, HGB, HCT, MCV, PLT in the last 72  hours.  No results found for this or any previous visit (from the past 240 hour(s)).   Hospital Course: This is a 75 year old who came to the hospital because of altered mental status. She has been having some trouble with what may be developing dementia over the last several months. She came to the emergency department and had a CT which was negative for acute stroke but was felt likely to have had some sort of an acute neurological event. She had consultation with neurology who diagnosed her with a stroke and felt that she should increase her aspirin from 81 mg to 325. She had some shortness of breath and it looked like she had heart failure so cardiology consultation was obtained. She was increased as far as her diuresis and was given IV diuresis for 24 hours. Because of abnormalities on her chest x-ray I had her do CT chest with concern that she might have interstitial lung disease potentially related to amiodarone use but this was more consistent with heart failure. By the time of discharge she was back at baseline and in fact improved. Her mental status was better. Her breathing was better.  Discharge Exam: Blood pressure (!) 122/59, pulse 66, temperature 98.2 F (36.8 C), temperature source Oral, resp. rate 20, height 5\' 1"  (1.549 m), weight  51 kg (112 lb 8 oz), SpO2 98 %. She is awake and alert. Her heart is regular with no gallop. Abdomen is soft with no masses. No edema.  Disposition: Home with home health services  Discharge Instructions    (HEART FAILURE PATIENTS) Call MD:  Anytime you have any of the following symptoms: 1) 3 pound weight gain in 24 hours or 5 pounds in 1 week 2) shortness of breath, with or without a dry hacking cough 3) swelling in the hands, feet or stomach 4) if you have to sleep on extra pillows at night in order to breathe.    Complete by:  As directed    Call MD for:  difficulty breathing, headache or visual disturbances    Complete by:  As directed    Diet - low  sodium heart healthy    Complete by:  As directed    Increase activity slowly    Complete by:  As directed         Signed: Micholas Drumwright L   09/24/2016, 11:10 AM

## 2016-09-24 NOTE — Progress Notes (Signed)
CM called [facility] rep, Jermaine to notify of discharge for [facility] to schedule start of care for HHRN/PT.  No other CM needs were communicated.

## 2016-09-24 NOTE — Progress Notes (Signed)
Subjective: She says she feels better. She is not confused this morning. Her breathing is doing okay. She was able to walk without oxygen some yesterday. She has no new complaints. No chest pain. No nausea or vomiting. No new neurological symptoms.  Objective: Vital signs in last 24 hours: Temp:  [97.7 F (36.5 C)-98.2 F (36.8 C)] 98.2 F (36.8 C) (04/21 0701) Pulse Rate:  [64-66] 66 (04/21 0701) Resp:  [16-20] 20 (04/21 0701) BP: (114-122)/(59-62) 122/59 (04/21 0701) SpO2:  [97 %-100 %] 98 % (04/21 0701) Weight:  [51 kg (112 lb 8 oz)] 51 kg (112 lb 8 oz) (04/20 1300) Weight change:  Last BM Date: 09/23/16  Intake/Output from previous day: 04/20 0701 - 04/21 0700 In: 600 [P.O.:600] Out: 650 [Urine:650]  PHYSICAL EXAM General appearance: alert, cooperative and no distress Resp: She still has fine rales but much less Cardio: Her heart is regular and she does not have an S3 gallop now GI: soft, non-tender; bowel sounds normal; no masses,  no organomegaly Extremities: extremities normal, atraumatic, no cyanosis or edema She is mildly hard of hearing skin warm and dry  Lab Results:  Results for orders placed or performed during the hospital encounter of 09/20/16 (from the past 48 hour(s))  Glucose, capillary     Status: Abnormal   Collection Time: 09/22/16 11:14 AM  Result Value Ref Range   Glucose-Capillary 168 (H) 65 - 99 mg/dL   Comment 1 Notify RN    Comment 2 Document in Chart   Troponin I     Status: Abnormal   Collection Time: 09/22/16  3:33 PM  Result Value Ref Range   Troponin I 0.04 (HH) <0.03 ng/mL    Comment: CRITICAL VALUE NOTED.  VALUE IS CONSISTENT WITH PREVIOUSLY REPORTED AND CALLED VALUE.  Glucose, capillary     Status: Abnormal   Collection Time: 09/22/16  4:06 PM  Result Value Ref Range   Glucose-Capillary 152 (H) 65 - 99 mg/dL   Comment 1 Notify RN    Comment 2 Document in Chart   Glucose, capillary     Status: Abnormal   Collection Time: 09/22/16   8:52 PM  Result Value Ref Range   Glucose-Capillary 151 (H) 65 - 99 mg/dL   Comment 1 Notify RN    Comment 2 Document in Chart   Glucose, capillary     Status: Abnormal   Collection Time: 09/23/16  7:38 AM  Result Value Ref Range   Glucose-Capillary 152 (H) 65 - 99 mg/dL   Comment 1 Notify RN    Comment 2 Document in Chart   Basic metabolic panel     Status: Abnormal   Collection Time: 09/23/16 11:09 AM  Result Value Ref Range   Sodium 138 135 - 145 mmol/L   Potassium 2.5 (LL) 3.5 - 5.1 mmol/L    Comment: CRITICAL RESULT CALLED TO, READ BACK BY AND VERIFIED WITH: [facility] AT 11:50AM ON 09/23/16 BY FESTERMAN,C    Chloride 94 (L) 101 - 111 mmol/L   CO2 34 (H) 22 - 32 mmol/L   Glucose, Bld 161 (H) 65 - 99 mg/dL   BUN 18 6 - 20 mg/dL   Creatinine, Ser 0.67 0.44 - 1.00 mg/dL   Calcium 8.7 (L) 8.9 - 10.3 mg/dL   GFR calc non Af Amer >60 >60 mL/min   GFR calc Af Amer >60 >60 mL/min    Comment: (NOTE) The eGFR has been calculated using the CKD EPI equation. This calculation has not been validated  in all clinical situations. eGFR's persistently <60 mL/min signify possible Chronic Kidney Disease.    Anion gap 10 5 - 15  Magnesium     Status: None   Collection Time: 09/23/16 11:09 AM  Result Value Ref Range   Magnesium 2.0 1.7 - 2.4 mg/dL  Glucose, capillary     Status: Abnormal   Collection Time: 09/23/16 11:39 AM  Result Value Ref Range   Glucose-Capillary 156 (H) 65 - 99 mg/dL   Comment 1 Notify RN    Comment 2 Document in Chart   Glucose, capillary     Status: Abnormal   Collection Time: 09/23/16  4:05 PM  Result Value Ref Range   Glucose-Capillary 140 (H) 65 - 99 mg/dL   Comment 1 Notify RN    Comment 2 Document in Chart   Glucose, capillary     Status: Abnormal   Collection Time: 09/23/16  9:02 PM  Result Value Ref Range   Glucose-Capillary 203 (H) 65 - 99 mg/dL   Comment 1 Notify RN    Comment 2 Document in Chart   Basic metabolic panel     Status: Abnormal    Collection Time: 09/24/16  6:19 AM  Result Value Ref Range   Sodium 140 135 - 145 mmol/L   Potassium 4.2 3.5 - 5.1 mmol/L    Comment: DELTA CHECK NOTED NO VISIBLE HEMOLYSIS    Chloride 102 101 - 111 mmol/L   CO2 31 22 - 32 mmol/L   Glucose, Bld 92 65 - 99 mg/dL   BUN 17 6 - 20 mg/dL   Creatinine, Ser 0.52 0.44 - 1.00 mg/dL   Calcium 8.6 (L) 8.9 - 10.3 mg/dL   GFR calc non Af Amer >60 >60 mL/min   GFR calc Af Amer >60 >60 mL/min    Comment: (NOTE) The eGFR has been calculated using the CKD EPI equation. This calculation has not been validated in all clinical situations. eGFR's persistently <60 mL/min signify possible Chronic Kidney Disease.    Anion gap 7 5 - 15  Glucose, capillary     Status: None   Collection Time: 09/24/16  7:52 AM  Result Value Ref Range   Glucose-Capillary 90 65 - 99 mg/dL    ABGS No results for input(s): PHART, PO2ART, TCO2, HCO3 in the last 72 hours.  Invalid input(s): PCO2 CULTURES No results found for this or any previous visit (from the past 240 hour(s)). Studies/Results: Ct Chest High Resolution  Result Date: 09/23/2016 CLINICAL DATA:  Dyspnea. Chest tightness. Evaluate for interstitial lung disease. EXAM: CT CHEST WITHOUT CONTRAST TECHNIQUE: Multidetector CT imaging of the chest was performed following the standard protocol without intravenous contrast. High resolution imaging of the lungs, as well as inspiratory and expiratory imaging, was performed. COMPARISON:  Chest radiograph from one day prior. 05/16/2015 chest CT. FINDINGS: Cardiovascular: Cardiomegaly. Small to moderate pericardial effusion. Left main, left anterior descending, left circumflex and right coronary atherosclerosis. Atherosclerotic nonaneurysmal thoracic aorta. Normal caliber pulmonary arteries. Three lead left subclavian ICD with lead tips in the right atrium, right ventricular apex and coronary sinus. Mediastinum/Nodes: No discrete thyroid nodules. Unremarkable esophagus. No  pathologically enlarged axillary, mediastinal or gross hilar lymph nodes, noting limited sensitivity for the detection of hilar adenopathy on this noncontrast study. Lungs/Pleura: No pneumothorax. Small right and trace left dependent pleural effusions with associated mild compressive atelectasis in the dependent lower lobes. Subsegmental atelectasis in the right middle lobe and lingula. No acute consolidative airspace disease, lung masses or significant pulmonary  nodules. Patchy ground-glass attenuation throughout both lungs. No significant interlobular septal thickening . No significant regions of subpleural reticulation, traction bronchiectasis, architectural distortion or frank honeycombing. No significant air trapping on the expiration sequence. Upper abdomen: Unremarkable. Musculoskeletal: No aggressive appearing focal osseous lesions. Mild T9 vertebral compression fracture, stable since 06/08/2016, new since 05/16/2015. Mild thoracic spondylosis. IMPRESSION: 1. Spectrum of findings of mild congestive heart failure including cardiomegaly, diffuse ground-glass attenuation compatible with mild pulmonary edema, and small right and trace left dependent pleural effusions. 2. Small to moderate pericardial effusion. 3. No evidence of interstitial lung disease. 4. Aortic atherosclerosis. Left main and 3 vessel coronary atherosclerosis. Electronically Signed   By: Ilona Sorrel M.D.   On: 09/23/2016 10:27    Medications:  Prior to Admission:  Prescriptions Prior to Admission  Medication Sig Dispense Refill Last Dose  . acetaminophen (TYLENOL) 500 MG tablet Take 500 mg by mouth every 6 (six) hours as needed.   unknown  . amiodarone (PACERONE) 200 MG tablet Take 100 mg by mouth daily at 12 noon.   09/19/2016 at Unknown time  . aspirin 81 MG tablet Take 81 mg by mouth daily.   09/20/2016 at Unknown time  . atorvastatin (LIPITOR) 20 MG tablet Take 1 tablet (20 mg total) by mouth daily at 6 PM. 30 tablet 12 09/19/2016  at Unknown time  . Carbamide Peroxide (EAR WAX DROPS OT) Place 1-2 drops in ear(s) daily as needed (wax build up).   09/20/2016 at Unknown time  . carvedilol (COREG) 3.125 MG tablet Take 1 tablet (3.125 mg total) by mouth 2 (two) times daily. 60 tablet 11 09/20/2016 at 0830  . insulin detemir (LEVEMIR) 100 UNIT/ML injection Inject 12 Units into the skin at bedtime.   09/19/2016 at Unknown time  . IRON PO Take 1 tablet by mouth daily.   09/20/2016 at Unknown time  . levothyroxine (SYNTHROID, LEVOTHROID) 75 MCG tablet Take 75 mcg by mouth daily before breakfast.   09/20/2016 at Unknown time  . loratadine (CLARITIN) 10 MG tablet Take 10 mg by mouth daily as needed for allergies.   09/20/2016 at Unknown time  . losartan (COZAAR) 25 MG tablet Take 0.5 tablets (12.5 mg total) by mouth daily. 90 tablet 3 09/20/2016 at Unknown time  . Omega-3 Fatty Acids (FISH OIL PO) Take by mouth 3 (three) times daily.   09/20/2016 at Unknown time  . potassium chloride SA (KLOR-CON M20) 20 MEQ tablet Take 2 Tablets In The Morning And Take 1 Tablet In The Evening 90 tablet 3 09/20/2016 at Unknown time  . sitaGLIPtin (JANUVIA) 100 MG tablet Take 100 mg by mouth daily.   09/20/2016 at Unknown time  . [DISCONTINUED] torsemide (DEMADEX) 20 MG tablet Take 1 tablet (20 mg total) by mouth daily. 180 tablet 3 09/20/2016 at Unknown time  . UNIFINE PENTIPS 32G X 4 MM MISC USE TWICE A DAY TO TAKE INSULIN. 100 each 2 Taking   Scheduled: . amiodarone  100 mg Oral Q1200  . [START ON 09/25/2016] aspirin EC  325 mg Oral Daily  . atorvastatin  20 mg Oral q1800  . carvedilol  6.25 mg Oral BID  . clopidogrel  75 mg Oral Daily  . heparin  5,000 Units Subcutaneous Q8H  . insulin aspart  0-15 Units Subcutaneous TID WC  . insulin aspart  0-5 Units Subcutaneous QHS  . levothyroxine  75 mcg Oral QAC breakfast  . linagliptin  5 mg Oral Daily  . losartan  12.5 mg Oral Daily  .  omega-3 acid ethyl esters  1 g Oral Daily  . potassium chloride  40 mEq  Oral BID  . torsemide  20 mg Oral Daily   Continuous:  KWI:[id number], nitroGLYCERIN, senna-docusate  Assesment: She was admitted with a stroke. She seems to be doing okay with that. She has an abnormal chest x-ray and I was concerned that she had interstitial pulmonary disease perhaps related to amiodarone but CT which I have personally reviewed is much more consistent with congestive heart failure. She had symptoms of heart failure which are better. This is acute on chronic systolic heart failure. I think she's okay to discharge home today. Active Problems:   Cerebrovascular disease   AICD (automatic cardioverter/defibrillator) present   CHF (congestive heart failure) ([place])   Acute CVA (cerebrovascular accident) ([place])    Plan: Discharge home today    LOS: 4 days   Rosemae Mcquown L 09/24/2016, 10:58 AM

## 2016-09-26 DIAGNOSIS — D571 Sickle-cell disease without crisis: Secondary | ICD-10-CM | POA: Diagnosis not present

## 2016-09-26 DIAGNOSIS — K219 Gastro-esophageal reflux disease without esophagitis: Secondary | ICD-10-CM | POA: Diagnosis not present

## 2016-09-26 DIAGNOSIS — I5043 Acute on chronic combined systolic (congestive) and diastolic (congestive) heart failure: Secondary | ICD-10-CM | POA: Diagnosis not present

## 2016-09-26 DIAGNOSIS — I110 Hypertensive heart disease with heart failure: Secondary | ICD-10-CM | POA: Diagnosis not present

## 2016-09-26 DIAGNOSIS — I4892 Unspecified atrial flutter: Secondary | ICD-10-CM | POA: Diagnosis not present

## 2016-09-26 DIAGNOSIS — E785 Hyperlipidemia, unspecified: Secondary | ICD-10-CM | POA: Diagnosis not present

## 2016-09-26 DIAGNOSIS — E119 Type 2 diabetes mellitus without complications: Secondary | ICD-10-CM | POA: Diagnosis not present

## 2016-09-26 DIAGNOSIS — I472 Ventricular tachycardia: Secondary | ICD-10-CM | POA: Diagnosis not present

## 2016-09-26 DIAGNOSIS — I255 Ischemic cardiomyopathy: Secondary | ICD-10-CM | POA: Diagnosis not present

## 2016-09-26 DIAGNOSIS — M359 Systemic involvement of connective tissue, unspecified: Secondary | ICD-10-CM | POA: Diagnosis not present

## 2016-09-26 NOTE — Progress Notes (Signed)
Dr. Merlene Laughter gave me a verbal order to discontinue nuero checks and vital signs every four hours on Thursday evening around 1915.  The neuro checks order was put in correctly, however, there was an error in charting the order for the vital signs and it looks like the vital signs were not discontinued at the same time.  Every four hour neuro checks and vital signs were discontinued as the diagnosis of stroke was ruled out for this patient by Dr. Merlene Laughter on Thursday, April 19 at 1915.

## 2016-09-27 ENCOUNTER — Telehealth: Payer: Self-pay

## 2016-09-27 DIAGNOSIS — E785 Hyperlipidemia, unspecified: Secondary | ICD-10-CM | POA: Diagnosis not present

## 2016-09-27 DIAGNOSIS — D571 Sickle-cell disease without crisis: Secondary | ICD-10-CM | POA: Diagnosis not present

## 2016-09-27 DIAGNOSIS — I472 Ventricular tachycardia: Secondary | ICD-10-CM | POA: Diagnosis not present

## 2016-09-27 DIAGNOSIS — K219 Gastro-esophageal reflux disease without esophagitis: Secondary | ICD-10-CM | POA: Diagnosis not present

## 2016-09-27 DIAGNOSIS — M359 Systemic involvement of connective tissue, unspecified: Secondary | ICD-10-CM | POA: Diagnosis not present

## 2016-09-27 DIAGNOSIS — I110 Hypertensive heart disease with heart failure: Secondary | ICD-10-CM | POA: Diagnosis not present

## 2016-09-27 DIAGNOSIS — I255 Ischemic cardiomyopathy: Secondary | ICD-10-CM | POA: Diagnosis not present

## 2016-09-27 DIAGNOSIS — E119 Type 2 diabetes mellitus without complications: Secondary | ICD-10-CM | POA: Diagnosis not present

## 2016-09-27 DIAGNOSIS — I4892 Unspecified atrial flutter: Secondary | ICD-10-CM | POA: Diagnosis not present

## 2016-09-27 DIAGNOSIS — I5043 Acute on chronic combined systolic (congestive) and diastolic (congestive) heart failure: Secondary | ICD-10-CM | POA: Diagnosis not present

## 2016-09-27 MED ORDER — DONEPEZIL HCL 10 MG PO TABS: 5.0000 mg | ORAL_TABLET | Freq: Every day | ORAL | 11 refills | Status: DC

## 2016-09-27 NOTE — Telephone Encounter (Signed)
-----   Message from Melvenia Beam, MD sent at 09/22/2016  6:48 PM EDT ----- [name], Dr. Harl Bowie said it was [facility] to take Aricept. If she is still willing to try it I would prescribe at 5mg  daily and then increase to 10mg  daily if tolerated over a month. Let me know thanks  ----- Message ----- From: Arnoldo Lenis, MD Sent: 09/14/2016   1:20 PM To: Melvenia Beam, MD  Thanks for update, we would be ok with trial of aricept  [name]  MD ----- Message ----- From: Melvenia Beam, MD Sent: 09/14/2016   1:03 PM To: Arnoldo Lenis, MD  Dr. Harl Bowie, so sorry to bother you! I would like to start patient on Aricept but hoping I could check in with you first because Aricept as you know it can cause bradycardia, av block and patient sees you for cardiomyopathy and other cardiac problems. Would you be ok with Aricept? Thanks [name] (Neurology)

## 2016-09-27 NOTE — Telephone Encounter (Signed)
Called pt's daughter and discussed starting donepezil including dosing and side effects. Verbalized understanding and appreciation for call. Rx e-scribed to pt's verified pharmacy.

## 2016-09-29 DIAGNOSIS — I255 Ischemic cardiomyopathy: Secondary | ICD-10-CM | POA: Diagnosis not present

## 2016-09-29 DIAGNOSIS — K219 Gastro-esophageal reflux disease without esophagitis: Secondary | ICD-10-CM | POA: Diagnosis not present

## 2016-09-29 DIAGNOSIS — I110 Hypertensive heart disease with heart failure: Secondary | ICD-10-CM | POA: Diagnosis not present

## 2016-09-29 DIAGNOSIS — E785 Hyperlipidemia, unspecified: Secondary | ICD-10-CM | POA: Diagnosis not present

## 2016-09-29 DIAGNOSIS — M359 Systemic involvement of connective tissue, unspecified: Secondary | ICD-10-CM | POA: Diagnosis not present

## 2016-09-29 DIAGNOSIS — E119 Type 2 diabetes mellitus without complications: Secondary | ICD-10-CM | POA: Diagnosis not present

## 2016-09-29 DIAGNOSIS — D571 Sickle-cell disease without crisis: Secondary | ICD-10-CM | POA: Diagnosis not present

## 2016-09-29 DIAGNOSIS — I4892 Unspecified atrial flutter: Secondary | ICD-10-CM | POA: Diagnosis not present

## 2016-09-29 DIAGNOSIS — I5043 Acute on chronic combined systolic (congestive) and diastolic (congestive) heart failure: Secondary | ICD-10-CM | POA: Diagnosis not present

## 2016-09-29 DIAGNOSIS — I472 Ventricular tachycardia: Secondary | ICD-10-CM | POA: Diagnosis not present

## 2016-10-03 DIAGNOSIS — E119 Type 2 diabetes mellitus without complications: Secondary | ICD-10-CM | POA: Diagnosis not present

## 2016-10-03 DIAGNOSIS — K219 Gastro-esophageal reflux disease without esophagitis: Secondary | ICD-10-CM | POA: Diagnosis not present

## 2016-10-03 DIAGNOSIS — I4892 Unspecified atrial flutter: Secondary | ICD-10-CM | POA: Diagnosis not present

## 2016-10-03 DIAGNOSIS — I110 Hypertensive heart disease with heart failure: Secondary | ICD-10-CM | POA: Diagnosis not present

## 2016-10-03 DIAGNOSIS — I472 Ventricular tachycardia: Secondary | ICD-10-CM | POA: Diagnosis not present

## 2016-10-03 DIAGNOSIS — I5043 Acute on chronic combined systolic (congestive) and diastolic (congestive) heart failure: Secondary | ICD-10-CM | POA: Diagnosis not present

## 2016-10-03 DIAGNOSIS — M359 Systemic involvement of connective tissue, unspecified: Secondary | ICD-10-CM | POA: Diagnosis not present

## 2016-10-03 DIAGNOSIS — E785 Hyperlipidemia, unspecified: Secondary | ICD-10-CM | POA: Diagnosis not present

## 2016-10-03 DIAGNOSIS — I255 Ischemic cardiomyopathy: Secondary | ICD-10-CM | POA: Diagnosis not present

## 2016-10-03 DIAGNOSIS — D571 Sickle-cell disease without crisis: Secondary | ICD-10-CM | POA: Diagnosis not present

## 2016-10-04 ENCOUNTER — Ambulatory Visit (INDEPENDENT_AMBULATORY_CARE_PROVIDER_SITE_OTHER): Payer: Self-pay

## 2016-10-04 ENCOUNTER — Telehealth: Payer: Self-pay

## 2016-10-04 DIAGNOSIS — I5022 Chronic systolic (congestive) heart failure: Secondary | ICD-10-CM | POA: Diagnosis not present

## 2016-10-04 DIAGNOSIS — Z95810 Presence of automatic (implantable) cardiac defibrillator: Secondary | ICD-10-CM

## 2016-10-04 DIAGNOSIS — I110 Hypertensive heart disease with heart failure: Secondary | ICD-10-CM | POA: Diagnosis not present

## 2016-10-04 DIAGNOSIS — E119 Type 2 diabetes mellitus without complications: Secondary | ICD-10-CM | POA: Diagnosis not present

## 2016-10-04 DIAGNOSIS — I639 Cerebral infarction, unspecified: Secondary | ICD-10-CM | POA: Diagnosis not present

## 2016-10-04 NOTE — Progress Notes (Signed)
EPIC Encounter for ICM Monitoring  Patient Name: Donna [name] is a 76 y.o. female Date: 10/04/2016 Primary Care Physican: Alonza Bogus, MD Primary Cardiologist: Wardell Heath PA Electrophysiologist: Druscilla Brownie Weight:unknown Bi-V Pacing: 99%      Attempted call to daughter Donna [name] [facility]).  Left message to return call.  Transmission reviewed.   Hospitalized 09/20/2016 with stroke dx and fluid accumulation - discharged 09/24/2016   Thoracic impedance abnormal suggesting fluid accumulation since 09/28/2016.  Prescribed andconfirmed dosage: Torsemide 20 mg 1 tablet daily and Potassium 20 mEq 2 tablets in am and 1 tablet in pm.   Labs: 08/31/2016 Creatinine 0.85, BUN 21, Potassium 4.1, Sodium 135, EGFR >60 06/08/2016 Creatinine 0.81, BUN 15, Potassium 4.6, Sodium 138, EGFR >60   Recommendations: NONE - Unable to reach patient   Follow-up plan: ICM clinic phone appointment on 10/10/2016 to recheck fluid levels.  Office appointment scheduled on 10/11/2016 with Dr Harl Bowie.  Copy of ICM check sent to primary cardiologist and device physician.   3 month ICM trend: 10/04/2016     1 Year ICM trend:      Donna Billings, RN 10/04/2016 8:42 AM

## 2016-10-04 NOTE — Telephone Encounter (Signed)
Remote ICM transmission received.  Attempted patient call and left message to return call.   

## 2016-10-05 DIAGNOSIS — E119 Type 2 diabetes mellitus without complications: Secondary | ICD-10-CM | POA: Diagnosis not present

## 2016-10-05 DIAGNOSIS — I4892 Unspecified atrial flutter: Secondary | ICD-10-CM | POA: Diagnosis not present

## 2016-10-05 DIAGNOSIS — E785 Hyperlipidemia, unspecified: Secondary | ICD-10-CM | POA: Diagnosis not present

## 2016-10-05 DIAGNOSIS — I5043 Acute on chronic combined systolic (congestive) and diastolic (congestive) heart failure: Secondary | ICD-10-CM | POA: Diagnosis not present

## 2016-10-05 DIAGNOSIS — D571 Sickle-cell disease without crisis: Secondary | ICD-10-CM | POA: Diagnosis not present

## 2016-10-05 DIAGNOSIS — I255 Ischemic cardiomyopathy: Secondary | ICD-10-CM | POA: Diagnosis not present

## 2016-10-05 DIAGNOSIS — K219 Gastro-esophageal reflux disease without esophagitis: Secondary | ICD-10-CM | POA: Diagnosis not present

## 2016-10-05 DIAGNOSIS — I472 Ventricular tachycardia: Secondary | ICD-10-CM | POA: Diagnosis not present

## 2016-10-05 DIAGNOSIS — I110 Hypertensive heart disease with heart failure: Secondary | ICD-10-CM | POA: Diagnosis not present

## 2016-10-05 DIAGNOSIS — M359 Systemic involvement of connective tissue, unspecified: Secondary | ICD-10-CM | POA: Diagnosis not present

## 2016-10-07 DIAGNOSIS — I255 Ischemic cardiomyopathy: Secondary | ICD-10-CM | POA: Diagnosis not present

## 2016-10-07 DIAGNOSIS — I472 Ventricular tachycardia: Secondary | ICD-10-CM | POA: Diagnosis not present

## 2016-10-07 DIAGNOSIS — I5043 Acute on chronic combined systolic (congestive) and diastolic (congestive) heart failure: Secondary | ICD-10-CM | POA: Diagnosis not present

## 2016-10-07 DIAGNOSIS — E119 Type 2 diabetes mellitus without complications: Secondary | ICD-10-CM | POA: Diagnosis not present

## 2016-10-07 DIAGNOSIS — I4892 Unspecified atrial flutter: Secondary | ICD-10-CM | POA: Diagnosis not present

## 2016-10-07 DIAGNOSIS — I110 Hypertensive heart disease with heart failure: Secondary | ICD-10-CM | POA: Diagnosis not present

## 2016-10-07 DIAGNOSIS — M359 Systemic involvement of connective tissue, unspecified: Secondary | ICD-10-CM | POA: Diagnosis not present

## 2016-10-07 DIAGNOSIS — D571 Sickle-cell disease without crisis: Secondary | ICD-10-CM | POA: Diagnosis not present

## 2016-10-07 DIAGNOSIS — E785 Hyperlipidemia, unspecified: Secondary | ICD-10-CM | POA: Diagnosis not present

## 2016-10-07 DIAGNOSIS — K219 Gastro-esophageal reflux disease without esophagitis: Secondary | ICD-10-CM | POA: Diagnosis not present

## 2016-10-10 ENCOUNTER — Ambulatory Visit (INDEPENDENT_AMBULATORY_CARE_PROVIDER_SITE_OTHER): Payer: Self-pay

## 2016-10-10 ENCOUNTER — Telehealth: Payer: Self-pay | Admitting: Cardiology

## 2016-10-10 DIAGNOSIS — I5022 Chronic systolic (congestive) heart failure: Secondary | ICD-10-CM

## 2016-10-10 DIAGNOSIS — Z95810 Presence of automatic (implantable) cardiac defibrillator: Secondary | ICD-10-CM

## 2016-10-10 NOTE — Telephone Encounter (Signed)
Spoke with pt and reminded pt of remote transmission that is due today. Pt verbalized understanding.   

## 2016-10-10 NOTE — Progress Notes (Signed)
EPIC Encounter for ICM Monitoring  Patient Name: Donna [name] is a 76 y.o. female Date: 10/10/2016 Primary Care Physican: Sinda Du, MD Primary Cardiologist: Wardell Heath PA Electrophysiologist: Lovena Le Dry Weight:unknown Bi-V Pacing: 99%                                                        Attempted call to daughter [name] [facility]) and unable to reach.   Patient discharge from hospital on 09/24/2016 for CHF and stroke dx.   Thoracic impedance abnormal suggesting fluid accumulation 09/28/2016 to 10/09/2016 and appears to be close to baseline today.  Prescribed dosage: Torsemide 20 mg 1.5 tablets (30 mg total) daily.  Potassium 20 mEq 2 tablets in AM and 1 tablet in PM.   Labs: 08/31/2016 Creatinine 0.85, BUN 21, Potassium 4.1, Sodium 135, EGFR >60 06/08/2016 Creatinine 0.81, BUN 15, Potassium 4.6, Sodium 138, EGFR >60  Recommendations:  Dr Harl Bowie will make any recommendations if needed at tomorrow's office visit.   Follow-up plan: ICM clinic phone appointment on 10/17/2016.  Office appointment scheduled on 10/11/2016 with Dr Harl Bowie.    Copy of ICM check sent to primary cardiologist and device physician.   3 month ICM trend: 10/10/2016   1 Year ICM trend:      [name], RN 10/10/2016 2:29 PM

## 2016-10-11 ENCOUNTER — Ambulatory Visit (INDEPENDENT_AMBULATORY_CARE_PROVIDER_SITE_OTHER): Payer: Medicare HMO | Admitting: Cardiology

## 2016-10-11 ENCOUNTER — Encounter: Payer: Self-pay | Admitting: Cardiology

## 2016-10-11 VITALS — BP 110/62 | HR 80 | Ht 60.0 in | Wt 111.0 lb

## 2016-10-11 DIAGNOSIS — I5022 Chronic systolic (congestive) heart failure: Secondary | ICD-10-CM

## 2016-10-11 DIAGNOSIS — I472 Ventricular tachycardia: Secondary | ICD-10-CM

## 2016-10-11 DIAGNOSIS — I4729 Other ventricular tachycardia: Secondary | ICD-10-CM

## 2016-10-11 DIAGNOSIS — R69 Illness, unspecified: Secondary | ICD-10-CM | POA: Diagnosis not present

## 2016-10-11 NOTE — Progress Notes (Signed)
Clinical Summary Donna [name] is a 76 y.o.female seen today for follow up of the following medical problems  1. NICM  - long history of cardiomyopathy, prior caths without obstructive disease  - LVEF 20% by echo 06/2011,  - echo 10/2014 LVEF 15-20%.  - 02/2016 echo LVEF 20-25%, grade II diastolic dysfunction - she has BiV AICD followed by Dr Lovena Le.     -admit 09/2016 with AMS, had some evidence of CHF. Was diuresed, discharge weight 112 lbs - thoracic impedance by ICD check yesterday abnormal suggesting fluid accumulation over the past week, however returned back to baseline.  - no recent SOB/DOE. No recent edema. Weights stable 111 to 112 lbs at home, which is her baseline.  - compliant with meds.    2. Ventricular tachycardia  - followed by EP. She has BiV AICD.  - no recent palpitations       - neuro has recommended ASA 325mg  daiily.   Past Medical History:  Diagnosis Date  . AICD (automatic cardioverter/defibrillator) present   . Arthritis    "fingers" (11/18/2014)  . Atrial flutter (HCC)    Versus ventricular tachycardia; treated with amiodarone  . Cardiomyopathy, nonischemic ([place])    Nl cors in [place]; CHF in 12/97; EF of 40% in 5/01 and 7/03, 25% in 9/04 and 4/08.  systolic murmur without valvular abnormalities by echo; refused automatic implantable cardiac defibrillator  . Cerebrovascular disease    Duplex study in 12/2006 shows atherosclerosis without focal stenosis  . CHF (congestive heart failure) ([place])   . Diabetes mellitus, type II ([place])    No insulin  . Gastroesophageal reflux disease   . Hyperlipidemia   . Hypertension   . Left bundle Donna [name] block   . Ventricular tachycardia ([place])    a. PMH it lists "atrial flutter versus ventricular tachycardia treated with amiodarone" - details of this are unclear. Notes from back to 2006 indicate she has been maintained on amiodarone but do not describe further indication. Patient's sister reports pt  was told she had skipped beats that could cause her to drop dead - Dr. Lattie Haw put her on amiodarone and recommended a defibrillator.     Allergies  Allergen Reactions  . Codeine Other (See Comments)    Reaction:  Racing heart   . Decongestant [Pseudoephedrine Hcl Er] Other (See Comments)    Reaction:  Racing heart      Current Outpatient Prescriptions  Medication Sig Dispense Refill  . acetaminophen (TYLENOL) 500 MG tablet Take 500 mg by mouth every 6 (six) hours as needed.    Marland Kitchen amiodarone (PACERONE) 200 MG tablet Take 100 mg by mouth daily at 12 noon.    Marland Kitchen aspirin EC 325 MG EC tablet Take 1 tablet (325 mg total) by mouth daily. 30 tablet 0  . atorvastatin (LIPITOR) 20 MG tablet Take 1 tablet (20 mg total) by mouth daily at 6 PM. 30 tablet 12  . Carbamide Peroxide (EAR WAX DROPS OT) Place 1-2 drops in ear(s) daily as needed (wax build up).    . carvedilol (COREG) 6.25 MG tablet Take 1 tablet (6.25 mg total) by mouth 2 (two) times daily. 60 tablet 12  . donepezil (ARICEPT) 10 MG tablet Take 0.5 tablets (5 mg total) by mouth at bedtime. After 1 wk if tolerated, increase to 1 tablet (10 mg total) by mouth at bedtime. 30 tablet 11  . insulin detemir (LEVEMIR) 100 UNIT/ML injection Inject 12 Units into the skin at bedtime.    Marland Kitchen  IRON PO Take 1 tablet by mouth daily.    Marland Kitchen levothyroxine (SYNTHROID, LEVOTHROID) 75 MCG tablet Take 75 mcg by mouth daily before breakfast.    . loratadine (CLARITIN) 10 MG tablet Take 10 mg by mouth daily as needed for allergies.    Marland Kitchen losartan (COZAAR) 25 MG tablet Take 0.5 tablets (12.5 mg total) by mouth daily. 90 tablet 3  . Omega-3 Fatty Acids (FISH OIL PO) Take by mouth 3 (three) times daily.    . potassium chloride SA (KLOR-CON M20) 20 MEQ tablet Take 2 Tablets In The Morning And Take 1 Tablet In The Evening 90 tablet 3  . sitaGLIPtin (JANUVIA) 100 MG tablet Take 100 mg by mouth daily.    Marland Kitchen torsemide (DEMADEX) 20 MG tablet Take 1.5 tablets (30 mg total) by  mouth daily. 180 tablet 3  . UNIFINE PENTIPS 32G X 4 MM MISC USE TWICE A DAY TO TAKE INSULIN. 100 each 2   No current facility-administered medications for this visit.      Past Surgical History:  Procedure Laterality Date  . ABDOMINAL HYSTERECTOMY  2006  . BI-VENTRICULAR IMPLANTABLE CARDIOVERTER DEFIBRILLATOR  (CRT-D)  11/18/2014  . COLONOSCOPY  2008  . EP IMPLANTABLE DEVICE N/A 11/18/2014   Procedure: BiV ICD Insertion CRT-D;  Surgeon: Evans Lance, MD;  Location: [facility];  Service: Cardiovascular;  Laterality: N/A;  . HERNIA REPAIR Right   . TONSILLECTOMY       Allergies  Allergen Reactions  . Codeine Other (See Comments)    Reaction:  Racing heart   . Decongestant [Pseudoephedrine Hcl Er] Other (See Comments)    Reaction:  Racing heart       Family History  Problem Relation Age of Onset  . Diabetes Mother   . Kidney disease Mother   . Heart failure Mother   . Stomach cancer Father   . Hypertension Father   . Diabetes Sister   . Seizures Sister   . Heart attack Brother   . Seizures Brother   . Dementia Neg Hx      Social History Donna [name] reports that she has never smoked. She has never used smokeless tobacco. Ms. Wurtz reports that she does not drink alcohol.   Review of Systems CONSTITUTIONAL: No weight loss, fever, chills, weakness or fatigue.  HEENT: Eyes: No visual loss, blurred vision, double vision or yellow sclerae.No hearing loss, sneezing, congestion, runny nose or sore throat.  SKIN: No rash or itching.  CARDIOVASCULAR: per hpi RESPIRATORY: No shortness of breath, cough or sputum.  GASTROINTESTINAL: No anorexia, nausea, vomiting or diarrhea. No abdominal pain or blood.  GENITOURINARY: No burning on urination, no polyuria NEUROLOGICAL: No headache, dizziness, syncope, paralysis, ataxia, numbness or tingling in the extremities. No change in bowel or bladder control.  MUSCULOSKELETAL: No muscle, back pain, joint pain or stiffness.    LYMPHATICS: No enlarged nodes. No history of splenectomy.  PSYCHIATRIC: No history of depression or anxiety.  ENDOCRINOLOGIC: No reports of sweating, cold or heat intolerance. No polyuria or polydipsia.  Marland Kitchen   Physical Examination Vitals:   10/11/16 1326  BP: 110/62  Pulse: 80   Vitals:   10/11/16 1326  Weight: 111 lb (50.3 kg)  Height: 5' (1.524 m)    Gen: resting comfortably, no acute distress HEENT: no scleral icterus, pupils equal round and reactive, no palptable cervical adenopathy,  CV: RRR, no m/r/g, n ojvd Resp: Clear to auscultation bilaterally GI: abdomen is soft, non-tender, non-distended, normal bowel sounds,  no hepatosplenomegaly MSK: extremities are warm, no edema.  Skin: warm, no rash Neuro:  no focal deficits Psych: appropriate affect   Diagnostic Studies 06/2011 Echo LVEF 20%, mildly dilated, mild LVH, grade I diastolic dysfunction, mild to mod MR, PASP 36   10/04/13 Echo Study Conclusions  - Left ventricle: Left ventricular systolic function is severely reduced, EF 15-20%. Severe global hypokinesis to akinesis is seen. The cavity size was moderately to severely dilated. Wall thickness was increased in a pattern of mild LVH. Doppler parameters are consistent with abnormal left ventricular relaxation (grade 1 diastolic dysfunction). Doppler parameters are consistent with high ventricular filling pressure. - Regional wall motion abnormality: Akinesis of the mid anterior, mid anteroseptal, basal-mid inferoseptal, apical septal, and apical myocardium; severe hypokinesis of the mid inferior and apical lateral myocardium; moderate hypokinesis of the mid inferolateral and basal-mid anterolateral myocardium. - Aortic valve: Mildly calcified annulus. Mildly thickened leaflets. - Mitral valve: Mildly dilated annulus. Mildly thickened leaflets . Mild to moderate regurgitation. - Left atrium: The atrium was moderately dilated. - Tricuspid valve: Mild  regurgitation. - Pulmonary arteries: PA peak pressure: 74mm Hg (S). Mildly elevated pulmonary pressures. - Systemic veins: IVC is dilated, with normal respiratory variation. Estimated CVP 8 mmHg. - Pericardium, extracardiac: A trivial pericardial effusion was identified posterior to the heart.  10/2014 Echo Study Conclusions  - Left ventricle: There is global hypokinesis with akinesis of the anterior, inferior walls and paradoxical septal motion. Overall LVEF is severely refused estimated at 15-20%. The cavity size was mildly dilated. There was moderate concentric hypertrophy. Systolic function was severely reduced. The estimated ejection fraction was in the range of 15-20%. Features are consistent with a pseudonormal left ventricular filling pattern, with concomitant abnormal relaxation and increased filling pressure (grade 2 diastolic dysfunction). Doppler parameters are consistent with elevated ventricular end-diastolic filling pressure. - Ventricular septum: Septal motion showed paradox. - Aortic valve: Trileaflet; normal thickness leaflets. There was no regurgitation. - Aortic root: The aortic root was normal in size. - Mitral valve: Structurally normal valve. There was moderate regurgitation. - Left atrium: The atrium was normal in size. - Right ventricle: Systolic function was mildly reduced. - Right atrium: The atrium was normal in size. - Tricuspid valve: There was mild regurgitation. - Pulmonary arteries: Systolic pressure was within the normal range. PA peak pressure: 34 mm Hg (S). - Inferior vena cava: The vessel was normal in size. - Pericardium, extracardiac: A trivial pericardial effusion was identified.     Assessment and Plan  1. NICM/Chronic systolic HF  - no recent symptoms, weights stable at her baseline around 111-112 lbs - continue current meds    2. Ventricular tachycardia  - no recent issues, continue to monitor.      F/u 3 months   Arnoldo Lenis, M.D.

## 2016-10-11 NOTE — Patient Instructions (Signed)
Medication Instruction: Your physician recommends that you continue on your current medications as directed. Please refer to the Current Medication list given to you today.   Labwork: none  Testing/Procedures: none  Follow-Up: Your physician recommends that you schedule a follow-up appointment in: 3 months     Any Other Special Instructions Will Be Listed Below (If Applicable).     If you need a refill on your cardiac medications before your next appointment, please call your pharmacy.

## 2016-10-11 NOTE — Progress Notes (Signed)
At office visit on 10/11/2016, Dr Harl Bowie recommended patient take torsemide 30mg  daily x 2 days then go back to 20mg  daily due to leg edema.

## 2016-10-14 DIAGNOSIS — K219 Gastro-esophageal reflux disease without esophagitis: Secondary | ICD-10-CM | POA: Diagnosis not present

## 2016-10-14 DIAGNOSIS — I472 Ventricular tachycardia: Secondary | ICD-10-CM | POA: Diagnosis not present

## 2016-10-14 DIAGNOSIS — D571 Sickle-cell disease without crisis: Secondary | ICD-10-CM | POA: Diagnosis not present

## 2016-10-14 DIAGNOSIS — I110 Hypertensive heart disease with heart failure: Secondary | ICD-10-CM | POA: Diagnosis not present

## 2016-10-14 DIAGNOSIS — I255 Ischemic cardiomyopathy: Secondary | ICD-10-CM | POA: Diagnosis not present

## 2016-10-14 DIAGNOSIS — E119 Type 2 diabetes mellitus without complications: Secondary | ICD-10-CM | POA: Diagnosis not present

## 2016-10-14 DIAGNOSIS — I4892 Unspecified atrial flutter: Secondary | ICD-10-CM | POA: Diagnosis not present

## 2016-10-14 DIAGNOSIS — I5043 Acute on chronic combined systolic (congestive) and diastolic (congestive) heart failure: Secondary | ICD-10-CM | POA: Diagnosis not present

## 2016-10-14 DIAGNOSIS — E785 Hyperlipidemia, unspecified: Secondary | ICD-10-CM | POA: Diagnosis not present

## 2016-10-14 DIAGNOSIS — M359 Systemic involvement of connective tissue, unspecified: Secondary | ICD-10-CM | POA: Diagnosis not present

## 2016-10-17 ENCOUNTER — Ambulatory Visit (INDEPENDENT_AMBULATORY_CARE_PROVIDER_SITE_OTHER): Payer: Medicare HMO

## 2016-10-17 DIAGNOSIS — Z95810 Presence of automatic (implantable) cardiac defibrillator: Secondary | ICD-10-CM

## 2016-10-17 DIAGNOSIS — I5022 Chronic systolic (congestive) heart failure: Secondary | ICD-10-CM

## 2016-10-17 NOTE — Progress Notes (Signed)
EPIC Encounter for ICM Monitoring  Patient Name: Donna [name] is a 76 y.o. female Date: 10/17/2016 Primary Care Physican: Sinda Du, MD Primary Cardiologist: Wardell Heath PA Electrophysiologist: Lovena Le Dry Weight:unknown Bi-V Pacing: 99%      Spoke withdaughter Donna [name] ([facility]).  Heart Failure questions reviewed, pt leg swelling has decreased a lot.    Thoracic impedance normal after increase in Torsemide to  54m daily x 2 days then go back to 274mdaily due to leg edema per Dr BrHarl Bowiet 5/8 office visit.  Prescribed dosage: Torsemide 20 mg 1.5 tablets (30 mg total) daily.  Potassium 20 mEq 2 tablets in AM and 1 tablet in PM.   Labs: 09/24/2016 Creatinine 0.52, BUN 17, Potassium 4.2, Sodium 140, EGFR >60 09/23/2016 Creatinine 0.67, BUN 18, Potassium 2.5, Sodium 138, EGFR >60  09/20/2016 Creatinine 0.72, BUN 17, Potassium 4.0, Sodium 138, EGFR >60  08/31/2016 Creatinine 0.85, BUN 21, Potassium 4.1, Sodium 135, EGFR >60 06/08/2016 Creatinine 0.81, BUN 15, Potassium 4.6, Sodium 138, EGFR >60  Recommendations: No changes. Discussed to limit salt intake to 2000 mg/day and fluid intake to < 2 liters/day.  Encouraged to call for fluid symptoms or use local ER for any urgent symptoms.  Follow-up plan: ICM clinic phone appointment on 11/17/2016.  Office appointment scheduled on 01/30/2017 with Dr BrHarl Bowie Copy of ICM check sent to device physician.   3 month ICM trend: 10/17/2016   1 Year ICM trend:      Donna BillingsRN 10/17/2016 12:37 PM

## 2016-10-24 ENCOUNTER — Other Ambulatory Visit: Payer: Self-pay | Admitting: "Endocrinology

## 2016-11-10 DIAGNOSIS — I5022 Chronic systolic (congestive) heart failure: Secondary | ICD-10-CM | POA: Diagnosis not present

## 2016-11-10 DIAGNOSIS — E119 Type 2 diabetes mellitus without complications: Secondary | ICD-10-CM | POA: Diagnosis not present

## 2016-11-10 DIAGNOSIS — I10 Essential (primary) hypertension: Secondary | ICD-10-CM | POA: Diagnosis not present

## 2016-11-10 DIAGNOSIS — J301 Allergic rhinitis due to pollen: Secondary | ICD-10-CM | POA: Diagnosis not present

## 2016-11-11 ENCOUNTER — Other Ambulatory Visit (HOSPITAL_COMMUNITY)
Admission: RE | Admit: 2016-11-11 | Discharge: 2016-11-11 | Disposition: A | Discharge status: Home / Self Care | Payer: Medicare HMO | Source: Ambulatory Visit | Attending: Pulmonary Disease | Admitting: Pulmonary Disease

## 2016-11-11 DIAGNOSIS — J301 Allergic rhinitis due to pollen: Secondary | ICD-10-CM | POA: Diagnosis not present

## 2016-11-11 DIAGNOSIS — I10 Essential (primary) hypertension: Secondary | ICD-10-CM | POA: Insufficient documentation

## 2016-11-11 DIAGNOSIS — I5022 Chronic systolic (congestive) heart failure: Secondary | ICD-10-CM | POA: Diagnosis not present

## 2016-11-11 DIAGNOSIS — E119 Type 2 diabetes mellitus without complications: Secondary | ICD-10-CM | POA: Diagnosis not present

## 2016-11-12 LAB — HEMOGLOBIN A1C
Hgb A1c MFr Bld: 5.7 % — ABNORMAL HIGH (ref 4.8–5.6)
Mean Plasma Glucose: 117 mg/dL

## 2016-11-14 DIAGNOSIS — R69 Illness, unspecified: Secondary | ICD-10-CM | POA: Diagnosis not present

## 2016-11-17 ENCOUNTER — Ambulatory Visit (INDEPENDENT_AMBULATORY_CARE_PROVIDER_SITE_OTHER): Payer: Medicare HMO

## 2016-11-17 ENCOUNTER — Telehealth: Payer: Self-pay

## 2016-11-17 DIAGNOSIS — Z95810 Presence of automatic (implantable) cardiac defibrillator: Secondary | ICD-10-CM

## 2016-11-17 DIAGNOSIS — I5022 Chronic systolic (congestive) heart failure: Secondary | ICD-10-CM

## 2016-11-17 NOTE — Telephone Encounter (Signed)
Remote ICM transmission received.  Attempted call to daughter and left detailed message regarding transmission and next ICM scheduled for 12/20/2016.  Advised to return call for any fluid symptoms or questions.

## 2016-11-17 NOTE — Progress Notes (Signed)
EPIC Encounter for ICM Monitoring  Patient Name: Donna [name] is a 76 y.o. female Date: 11/17/2016 Primary Care Physican: Sinda Du, MD Primary Cardiologist: Wardell Heath PA Electrophysiologist: Lovena Le Dry Weight:unknown Bi-V Pacing: 98%                                          Attempted call to daughter [name] [facility]).  Left detailed message regarding transmission.  Transmission reviewed.    Thoracic impedance normal but was abnormal 10/18/2016 to 10/27/2016.  Prescribed dosage: Torsemide 20 mg 1.5tablets (30 mg total)daily. Potassium 20 mEq 2 tablets in AM and 1 tablet in PM.   Labs: 09/24/2016 Creatinine 0.52, BUN 17, Potassium 4.2, Sodium 140, EGFR >60 09/23/2016 Creatinine 0.67, BUN 18, Potassium 2.5, Sodium 138, EGFR >60  09/20/2016 Creatinine 0.72, BUN 17, Potassium 4.0, Sodium 138, EGFR >60  08/31/2016 Creatinine 0.85, BUN 21, Potassium 4.1, Sodium 135, EGFR >60 06/08/2016 Creatinine 0.81, BUN 15, Potassium 4.6, Sodium 138, EGFR >60  Recommendations: Left voice mail with ICM number and encouraged to call for fluid symptoms.  Follow-up plan: ICM clinic phone appointment on 12/20/2016.  Office appointment scheduled on 01/30/2017 with Dr. Harl Bowie.  Copy of ICM check sent to device physician.   3 month ICM trend: 11/17/2016   1 Year ICM trend:      [name], RN 11/17/2016 9:13 AM

## 2016-11-30 DIAGNOSIS — M79672 Pain in left foot: Secondary | ICD-10-CM | POA: Diagnosis not present

## 2016-11-30 DIAGNOSIS — B351 Tinea unguium: Secondary | ICD-10-CM | POA: Diagnosis not present

## 2016-11-30 DIAGNOSIS — R69 Illness, unspecified: Secondary | ICD-10-CM | POA: Diagnosis not present

## 2016-11-30 DIAGNOSIS — M79671 Pain in right foot: Secondary | ICD-10-CM | POA: Diagnosis not present

## 2016-12-04 ENCOUNTER — Emergency Department (HOSPITAL_COMMUNITY): Payer: Medicare HMO

## 2016-12-04 ENCOUNTER — Emergency Department (HOSPITAL_COMMUNITY)
Admission: EM | Admit: 2016-12-04 | Discharge: 2016-12-04 | Disposition: A | Discharge status: Home / Self Care | Payer: Medicare HMO | Attending: Emergency Medicine | Admitting: Emergency Medicine

## 2016-12-04 ENCOUNTER — Encounter (HOSPITAL_COMMUNITY): Payer: Self-pay | Admitting: Emergency Medicine

## 2016-12-04 DIAGNOSIS — E162 Hypoglycemia, unspecified: Secondary | ICD-10-CM | POA: Insufficient documentation

## 2016-12-04 DIAGNOSIS — E079 Disorder of thyroid, unspecified: Secondary | ICD-10-CM | POA: Insufficient documentation

## 2016-12-04 DIAGNOSIS — E119 Type 2 diabetes mellitus without complications: Secondary | ICD-10-CM | POA: Diagnosis not present

## 2016-12-04 DIAGNOSIS — I679 Cerebrovascular disease, unspecified: Secondary | ICD-10-CM | POA: Diagnosis not present

## 2016-12-04 DIAGNOSIS — I5023 Acute on chronic systolic (congestive) heart failure: Secondary | ICD-10-CM | POA: Diagnosis not present

## 2016-12-04 DIAGNOSIS — I5021 Acute systolic (congestive) heart failure: Secondary | ICD-10-CM | POA: Diagnosis not present

## 2016-12-04 DIAGNOSIS — E1165 Type 2 diabetes mellitus with hyperglycemia: Secondary | ICD-10-CM | POA: Diagnosis not present

## 2016-12-04 DIAGNOSIS — Z794 Long term (current) use of insulin: Secondary | ICD-10-CM | POA: Diagnosis not present

## 2016-12-04 DIAGNOSIS — Z79899 Other long term (current) drug therapy: Secondary | ICD-10-CM | POA: Insufficient documentation

## 2016-12-04 DIAGNOSIS — E108 Type 1 diabetes mellitus with unspecified complications: Secondary | ICD-10-CM | POA: Diagnosis not present

## 2016-12-04 DIAGNOSIS — Z95810 Presence of automatic (implantable) cardiac defibrillator: Secondary | ICD-10-CM | POA: Diagnosis not present

## 2016-12-04 DIAGNOSIS — I509 Heart failure, unspecified: Secondary | ICD-10-CM | POA: Diagnosis not present

## 2016-12-04 DIAGNOSIS — R531 Weakness: Secondary | ICD-10-CM | POA: Diagnosis not present

## 2016-12-04 DIAGNOSIS — I110 Hypertensive heart disease with heart failure: Secondary | ICD-10-CM | POA: Insufficient documentation

## 2016-12-04 DIAGNOSIS — Z7982 Long term (current) use of aspirin: Secondary | ICD-10-CM | POA: Diagnosis not present

## 2016-12-04 LAB — URINALYSIS, ROUTINE W REFLEX MICROSCOPIC
BILIRUBIN URINE: NEGATIVE
Bacteria, UA: NONE SEEN
Glucose, UA: NEGATIVE mg/dL
Ketones, ur: NEGATIVE mg/dL
NITRITE: NEGATIVE
Protein, ur: NEGATIVE mg/dL
SPECIFIC GRAVITY, URINE: 1.013 (ref 1.005–1.030)
pH: 6 (ref 5.0–8.0)

## 2016-12-04 LAB — CBC WITH DIFFERENTIAL/PLATELET
BASOS ABS: 0 10*3/uL (ref 0.0–0.1)
BASOS PCT: 0 %
Eosinophils Absolute: 0.1 10*3/uL (ref 0.0–0.7)
Eosinophils Relative: 1 %
HEMATOCRIT: 39.2 % (ref 36.0–46.0)
HEMOGLOBIN: 12.4 g/dL (ref 12.0–15.0)
LYMPHS PCT: 18 %
Lymphs Abs: 1.3 10*3/uL (ref 0.7–4.0)
MCH: 32.6 pg (ref 26.0–34.0)
MCHC: 31.6 g/dL (ref 30.0–36.0)
MCV: 103.2 fL — AB (ref 78.0–100.0)
MONO ABS: 0.4 10*3/uL (ref 0.1–1.0)
Monocytes Relative: 5 %
NEUTROS ABS: 5.2 10*3/uL (ref 1.7–7.7)
NEUTROS PCT: 76 %
Platelets: 142 10*3/uL — ABNORMAL LOW (ref 150–400)
RBC: 3.8 MIL/uL — ABNORMAL LOW (ref 3.87–5.11)
RDW: 14.7 % (ref 11.5–15.5)
WBC: 7 10*3/uL (ref 4.0–10.5)

## 2016-12-04 LAB — LACTIC ACID, PLASMA
LACTIC ACID, VENOUS: 1.2 mmol/L (ref 0.5–1.9)
Lactic Acid, Venous: 1.2 mmol/L (ref 0.5–1.9)

## 2016-12-04 LAB — COMPREHENSIVE METABOLIC PANEL
ALBUMIN: 3.7 g/dL (ref 3.5–5.0)
ALT: 32 U/L (ref 14–54)
ANION GAP: 7 (ref 5–15)
AST: 36 U/L (ref 15–41)
Alkaline Phosphatase: 41 U/L (ref 38–126)
BILIRUBIN TOTAL: 0.8 mg/dL (ref 0.3–1.2)
BUN: 15 mg/dL (ref 6–20)
CALCIUM: 9 mg/dL (ref 8.9–10.3)
CO2: 33 mmol/L — ABNORMAL HIGH (ref 22–32)
Chloride: 105 mmol/L (ref 101–111)
Creatinine, Ser: 0.85 mg/dL (ref 0.44–1.00)
GFR calc non Af Amer: >60 mL/min (ref >60)
GLUCOSE: 53 mg/dL — AB (ref 65–99)
POTASSIUM: 3.8 mmol/L (ref 3.5–5.1)
Sodium: 145 mmol/L (ref 135–145)
TOTAL PROTEIN: 6.2 g/dL — AB (ref 6.5–8.1)

## 2016-12-04 LAB — CBG MONITORING, ED
GLUCOSE-CAPILLARY: 75 mg/dL (ref 65–99)
GLUCOSE-CAPILLARY: 172 mg/dL — AB (ref 65–99)
Glucose-Capillary: 87 mg/dL (ref 65–99)

## 2016-12-04 LAB — TROPONIN I
TROPONIN I: 0.03 ng/mL — AB (ref <0.03)
TROPONIN I: 0.03 ng/mL — AB (ref <0.03)

## 2016-12-04 NOTE — ED Notes (Signed)
CRITICAL VALUE ALERT  Critical Value:  Troponin 0.03  Date & Time Notied:  12/04/2016 at 0900  Provider Notified: Dr Thurnell Garbe  Orders Received/Actions taken: none at this time

## 2016-12-04 NOTE — ED Triage Notes (Signed)
Pt bought in via EMS. Pt son administered 12 units of insulin last night before patient went to bed due to "blood sugar running high". Pt reports woke up with dizziness and generalized weakness. cbg upon ems arrival 55. EMS administered oral orange juice with sugar. cbg en route 80. cbg in ED 75.

## 2016-12-04 NOTE — Discharge Instructions (Signed)
Take your insulin and [place] tomorrow as previously directed. Call your regular medical doctor tomorrow to schedule a follow up appointment within the next 2 to 3 days.  Return to the Emergency Department immediately sooner if worsening.

## 2016-12-04 NOTE — ED Provider Notes (Signed)
[facility] DEPT Provider Note   CSN: [id number] Arrival date & time: 12/04/16  0713     History   Chief Complaint Chief Complaint  Patient presents with  . Hypoglycemia    HPI Donna [name] is a 76 y.o. female.  The history is provided by the patient, a relative and a caregiver. The history is limited by the condition of the patient (Hx confusion).  Hypoglycemia  Pt was seen at 0735. Per EMS, pt's family and pt:  Pt's family states pt's CBG was "running high yesterday" (217), so they gave her insulin 12 units at 2200 (before bedtime), as per PMD instructions. Pt's last meal was 1800 yesterday. Pt woke up this morning with lightheadedness and generalized weakness. Pt was able to walk with her cane to the telephone to call EMS and her family. EMS states pt's CBG as "55" on their arrival to scene. Pt was given juice with CBG increasing to "80." Denies focal motor weakness, no tingling/numbness in extremities, no CP/SOB, no abd pain, no N/V/D, no fevers.    Past Medical History:  Diagnosis Date  . AICD (automatic cardioverter/defibrillator) present   . Arthritis    "fingers" (11/18/2014)  . Atrial flutter (HCC)    Versus ventricular tachycardia; treated with amiodarone  . Cardiomyopathy, nonischemic ([place])    Nl cors in [place]; CHF in 12/97; EF of 40% in 5/01 and 7/03, 25% in 9/04 and 4/08.  systolic murmur without valvular abnormalities by echo; refused automatic implantable cardiac defibrillator  . Cerebrovascular disease    Duplex study in 12/2006 shows atherosclerosis without focal stenosis  . CHF (congestive heart failure) ([place])   . Diabetes mellitus, type II ([place])    No insulin  . Gastroesophageal reflux disease   . Hyperlipidemia   . Hypertension   . Left bundle branch block   . Ventricular tachycardia ([place])    a. PMH it lists "atrial flutter versus ventricular tachycardia treated with amiodarone" - details of this are unclear. Notes from back to 2006 indicate  she has been maintained on amiodarone but do not describe further indication. Patient's sister reports pt was told she had skipped beats that could cause her to drop dead - Dr. Lattie Haw put her on amiodarone and recommended a defibrillator.    Patient Active Problem List   Diagnosis Date Noted  . Cerebrovascular disease 09/20/2016  . AICD (automatic cardioverter/defibrillator) present 09/20/2016  . CHF (congestive heart failure) ([place]) 09/20/2016  . Acute CVA (cerebrovascular accident) ([place]) 09/20/2016  . Confusion 06/27/2016  . Nausea 03/09/2016  . Dizziness 02/03/2016  . Fall on flat surface, tripped on rug; no injury 01/19/2016    Class: Acute  . Acute on chronic systolic CHF (congestive heart failure) ([place]) 12/29/2015  . Hypokalemia 05/18/2015  . Acute on chronic systolic congestive heart failure ([place]) 05/16/2015  . Acute on chronic systolic (congestive) heart failure ([place]) 05/16/2015  . Chronic systolic heart failure ([place]) 11/18/2014  . NICM (nonischemic cardiomyopathy) ([place]) 11/18/2014  . Acute on chronic systolic heart failure ([place]) 10/20/2014  . Acute respiratory failure with hypoxia ([place]) 10/18/2014  . CHF exacerbation ([place]) 07/22/2014  . Acute exacerbation of CHF (congestive heart failure) ([place]) 07/22/2014  . Abnormality of gait 09/26/2013  . Weakness of left hip 09/26/2013  . Diabetes mellitus, type II ([place]) 06/15/2011  . Cardiomyopathy, nonischemic ([place])   . Hypertension   . Hypothyroidism 05/28/2009  . Left bundle branch block 05/28/2009  . GASTROESOPHAGEAL REFLUX DISEASE 05/28/2009  . Hyperlipidemia 05/14/2008  .  VENTRICULAR TACHYCARDIA 05/14/2008  . CEREBROVASCULAR DISEASE 05/14/2008    Past Surgical History:  Procedure Laterality Date  . ABDOMINAL HYSTERECTOMY  2006  . BI-VENTRICULAR IMPLANTABLE CARDIOVERTER DEFIBRILLATOR  (CRT-D)  11/18/2014  . COLONOSCOPY  2008  . EP IMPLANTABLE DEVICE N/A 11/18/2014   Procedure: BiV ICD Insertion CRT-D;  Surgeon: Evans Lance, MD;  Location: [facility];  Service: Cardiovascular;  Laterality: N/A;  . HERNIA REPAIR Right   . TONSILLECTOMY      OB History    Gravida Para Term Preterm AB Living             2   SAB TAB Ectopic Multiple Live Births                   Home Medications    Prior to Admission medications   Medication Sig Start Date End Date Taking? Authorizing Provider  acetaminophen (TYLENOL) 500 MG tablet Take 500 mg by mouth every 6 (six) hours as needed.    [provider]  amiodarone (PACERONE) 200 MG tablet Take 100 mg by mouth daily at 12 noon.    [provider]  aspirin EC 325 MG EC tablet Take 1 tablet (325 mg total) by mouth daily. 09/25/16   Sinda Du, MD  atorvastatin (LIPITOR) 20 MG tablet Take 1 tablet (20 mg total) by mouth daily at 6 PM. 07/24/14   Sinda Du, MD  Carbamide Peroxide (EAR WAX DROPS OT) Place 1-2 drops in ear(s) daily as needed (wax build up).    [provider]  carvedilol (COREG) 6.25 MG tablet Take 1 tablet (6.25 mg total) by mouth 2 (two) times daily. 09/24/16   Sinda Du, MD  donepezil (ARICEPT) 10 MG tablet Take 0.5 tablets (5 mg total) by mouth at bedtime. After 1 wk if tolerated, increase to 1 tablet (10 mg total) by mouth at bedtime. 09/27/16   Melvenia Beam, MD  insulin detemir (LEVEMIR) 100 UNIT/ML injection Inject 12 Units into the skin at bedtime.    Sinda Du, MD  IRON PO Take 1 tablet by mouth daily.    [provider]  levothyroxine (SYNTHROID, LEVOTHROID) 75 MCG tablet Take 75 mcg by mouth daily before breakfast.    [provider]  loratadine (CLARITIN) 10 MG tablet Take 10 mg by mouth daily as needed for allergies.    [provider]  losartan (COZAAR) 25 MG tablet Take 0.5 tablets (12.5 mg total) by mouth daily. 06/29/16 08/31/17  Imogene Burn, PA-C  Omega-3 Fatty Acids (FISH OIL PO) Take by mouth 3 (three) times daily.    [provider]  potassium  chloride SA (KLOR-CON M20) 20 MEQ tablet Take 2 Tablets In The Morning And Take 1 Tablet In The Evening 06/29/16   Imogene Burn, PA-C  sitaGLIPtin (JANUVIA) 100 MG tablet Take 100 mg by mouth daily.    [provider]  torsemide (DEMADEX) 20 MG tablet Take 1.5 tablets (30 mg total) by mouth daily. 09/24/16   Sinda Du, MD  UNIFINE PENTIPS 32G X 4 MM MISC USE TWICE A DAY TO TAKE INSULIN. 01/06/16   Cassandria Anger, MD    Family History Family History  Problem Relation Age of Onset  . Diabetes Mother   . Kidney disease Mother   . Heart failure Mother   . Stomach cancer Father   . Hypertension Father   . Diabetes Sister   . Seizures Sister   . Heart attack  Brother   . Seizures Brother   . Dementia Neg Hx     Social History Social History  Substance Use Topics  . Smoking status: Never Smoker  . Smokeless tobacco: Never Used  . Alcohol use No     Allergies   Codeine and Decongestant [pseudoephedrine hcl er]   Review of Systems Review of Systems  Unable to perform ROS: Dementia     Physical Exam Updated Vital Signs BP 120/70 (BP Location: Left Arm)   Pulse 64   Temp 97.9 F (36.6 C) (Oral)   Resp (!) 22   Ht 5\' 1"  (1.549 m)   Wt 45.4 kg (100 lb)   SpO2 96%   BMI 18.89 kg/m   08:20:48 Orthostatic Vital Signs TV  Orthostatic Lying   BP- Lying: 114/72  Pulse- Lying: 63      Orthostatic Sitting  BP- Sitting: 111/69  Pulse- Sitting: 67      Orthostatic Standing at 0 minutes  BP- Standing at 0 minutes: 113/70  Pulse- Standing at 0 minutes: 69    Physical Exam 0740: Physical examination:  Nursing notes reviewed; Vital signs and O2 SAT reviewed;  Constitutional: Well developed, Well nourished, Well hydrated, In no acute distress; Head:  Normocephalic, atraumatic; Eyes: EOMI, PERRL, No scleral icterus; ENMT: Mouth and pharynx normal, Mucous membranes moist; Neck: Supple, Full range of motion, No lymphadenopathy; Cardiovascular: Regular rate  and rhythm, No gallop; Respiratory: Breath sounds clear & equal bilaterally, No wheezes.  Speaking full sentences with ease, Normal respiratory effort/excursion; Chest: Nontender, Movement normal; Abdomen: Soft, Nontender, Nondistended, Normal bowel sounds; Genitourinary: No CVA tenderness; Extremities: Pulses normal, No tenderness, +1 pedal edema bilat. No calf asymmetry.; Neuro: Awake,alert, mildly confused per baseline. Major CN grossly intact. No facial droop. Speech clear. Grips equal. Strength 5/5 equal bilat UE's and LE's without apparent gross focal motor deficits.; Skin: Color normal, Warm, Dry.   ED Treatments / Results  Labs (all labs ordered are listed, but only abnormal results are displayed)   EKG  EKG Interpretation  Date/Time:  Sunday December 04 2016 07:20:37 EDT Ventricular Rate:  80 PR Interval:    QRS Duration: 156 QT Interval:  498 QTC Calculation: 874 R Axis:   -85 Text Interpretation:  Atrial-ventricular dual-paced complexes No further analysis attempted due to paced rhythm When compared with ECG of 09/22/2016 No significant change was found Confirmed by [facility]  MD, Nunzio Cory [phone]) on 12/04/2016 7:44:06 AM       Radiology   Procedures Procedures (including critical care time)  Medications Ordered in ED Medications - No data to display   Initial Impression / Assessment and Plan / ED Course  I have reviewed the triage vital signs and the nursing notes.  Pertinent labs & imaging results that were available during my care of the patient were reviewed by me and considered in my medical decision making (see chart for details).  MDM Reviewed: previous chart, vitals and nursing note Reviewed previous: labs and ECG Interpretation: labs, ECG, x-ray and CT scan   Results for orders placed or performed during the hospital encounter of 12/04/16  Urinalysis, Routine w reflex microscopic  Result Value Ref Range   Color, Urine YELLOW YELLOW   APPearance CLEAR CLEAR     Specific Gravity, Urine 1.013 1.005 - 1.030   pH 6.0 5.0 - 8.0   Glucose, UA NEGATIVE NEGATIVE mg/dL   Hgb urine dipstick SMALL (A) NEGATIVE   Bilirubin Urine NEGATIVE NEGATIVE   Ketones, ur NEGATIVE NEGATIVE  mg/dL   Protein, ur NEGATIVE NEGATIVE mg/dL   Nitrite NEGATIVE NEGATIVE   Leukocytes, UA TRACE (A) NEGATIVE   RBC / HPF 0-5 0 - 5 RBC/hpf   WBC, UA 0-5 0 - 5 WBC/hpf   Bacteria, UA NONE SEEN NONE SEEN   Squamous Epithelial / LPF 0-5 (A) NONE SEEN   Mucous PRESENT    Hyaline Casts, UA PRESENT   Comprehensive metabolic panel  Result Value Ref Range   Sodium 145 135 - 145 mmol/L   Potassium 3.8 3.5 - 5.1 mmol/L   Chloride 105 101 - 111 mmol/L   CO2 33 (H) 22 - 32 mmol/L   Glucose, Bld 53 (L) 65 - 99 mg/dL   BUN 15 6 - 20 mg/dL   Creatinine, Ser 0.85 0.44 - 1.00 mg/dL   Calcium 9.0 8.9 - 10.3 mg/dL   Total Protein 6.2 (L) 6.5 - 8.1 g/dL   Albumin 3.7 3.5 - 5.0 g/dL   AST 36 15 - 41 U/L   ALT 32 14 - 54 U/L   Alkaline Phosphatase 41 38 - 126 U/L   Total Bilirubin 0.8 0.3 - 1.2 mg/dL   GFR calc non Af Amer >60 >60 mL/min   GFR calc Af Amer >60 >60 mL/min   Anion gap 7 5 - 15  CBC with Differential  Result Value Ref Range   WBC 7.0 4.0 - 10.5 K/uL   RBC 3.80 (L) 3.87 - 5.11 MIL/uL   Hemoglobin 12.4 12.0 - 15.0 g/dL   HCT 39.2 36.0 - 46.0 %   MCV 103.2 (H) 78.0 - 100.0 fL   MCH 32.6 26.0 - 34.0 pg   MCHC 31.6 30.0 - 36.0 g/dL   RDW 14.7 11.5 - 15.5 %   Platelets 142 (L) 150 - 400 K/uL   Neutrophils Relative % 76 %   Neutro Abs 5.2 1.7 - 7.7 K/uL   Lymphocytes Relative 18 %   Lymphs Abs 1.3 0.7 - 4.0 K/uL   Monocytes Relative 5 %   Monocytes Absolute 0.4 0.1 - 1.0 K/uL   Eosinophils Relative 1 %   Eosinophils Absolute 0.1 0.0 - 0.7 K/uL   Basophils Relative 0 %   Basophils Absolute 0.0 0.0 - 0.1 K/uL  Lactic acid, plasma  Result Value Ref Range   Lactic Acid, Venous 1.2 0.5 - 1.9 mmol/L  Lactic acid, plasma  Result Value Ref Range   Lactic Acid, Venous 1.2  0.5 - 1.9 mmol/L  Troponin I  Result Value Ref Range   Troponin I 0.03 (HH) <0.03 ng/mL  Troponin I  Result Value Ref Range   Troponin I 0.03 (HH) <0.03 ng/mL  CBG monitoring, ED  Result Value Ref Range   Glucose-Capillary 75 65 - 99 mg/dL  CBG monitoring, ED  Result Value Ref Range   Glucose-Capillary 87 65 - 99 mg/dL  POC CBG, ED  Result Value Ref Range   Glucose-Capillary 172 (H) 65 - 99 mg/dL   Dg Chest 1 View Result Date: 12/04/2016 CLINICAL DATA:  Weakness, history non ischemic cardiomyopathy, hypertension, arrhythmias, type II diabetes mellitus, CHF EXAM: CHEST 1 VIEW COMPARISON:  09/22/2016 FINDINGS: LEFT subclavian AICD leads project over RIGHT atrium, RIGHT ventricle and coronary sinus. Enlargement of cardiac silhouette with pulmonary vascular congestion. Atherosclerotic calcification aorta. Improved pulmonary edema since previous study. Small LEFT pleural effusion. No pneumothorax. Bones demineralized. IMPRESSION: Improved CHF. Aortic Atherosclerosis (ICD10-I70.0). Electronically Signed   By: Lavonia Dana M.D.   On: 12/04/2016 09:07  Ct Head Wo Contrast Result Date: 12/04/2016 CLINICAL DATA:  Hyperglycemia, dizziness, generalized weakness, non ischemic cardiomyopathy, hypertension, arrhythmias, type II diabetes mellitus, CHF EXAM: CT HEAD WITHOUT CONTRAST TECHNIQUE: Contiguous axial images were obtained from the base of the skull through the vertex without intravenous contrast. Sagittal and coronal MPR images reconstructed from axial data set. COMPARISON:  09/20/2016 FINDINGS: Brain: Generalized atrophy. Normal ventricular morphology. No midline shift or mass effect. Small vessel chronic ischemic changes of deep cerebral white matter. No intracranial hemorrhage, mass lesion, or evidence of acute infarction. BILATERAL low attenuation old subdural hygromas at the frontal regions. Vascular: Atherosclerotic calcifications of internal carotid and vertebral arteries at skullbase bilaterally  Skull: Intact Sinuses/Orbits: Clear Other: N/A IMPRESSION: Atrophy with small vessel chronic ischemic changes of deep cerebral white matter. Old BILATERAL frontal subdural hygromas. No acute intracranial abnormalities. Electronically Signed   By: Lavonia Dana M.D.   On: 12/04/2016 09:14    1245:  Troponin chronically elevated.  Pt's workup reassuring. Pt ate and snack and is now eating a meal. Pt's family at bedside states they called another family member: pt "got her medicines out of sequence yesterday" and took her januvia at night instead of in the morning. Likely januvia + insulin caused morning hypoglycemia. Pt states she is "gradually feeling much better." Will check CBG after meal and discharge. Pt's family agreeable with plan.   1340:  Pt feels "fine" and wants to go home now. Has tol PO well without N/V. Neuro exam remains intact and unchanged. No clear indication for admission at this time. Family would like to take pt home now. Dx and testing d/w pt and family.  Questions answered.  Verb understanding, agreeable to d/c home with outpt f/u.    Final Clinical Impressions(s) / ED Diagnoses   Final diagnoses:  None    New Prescriptions New Prescriptions   No medications on file      Francine Graven, DO 12/11/16 1058

## 2016-12-04 NOTE — ED Notes (Signed)
cbg 87 

## 2016-12-04 NOTE — ED Notes (Signed)
CBG 87. 

## 2016-12-20 ENCOUNTER — Ambulatory Visit (INDEPENDENT_AMBULATORY_CARE_PROVIDER_SITE_OTHER): Payer: Medicare HMO | Admitting: *Deleted

## 2016-12-20 DIAGNOSIS — I5022 Chronic systolic (congestive) heart failure: Secondary | ICD-10-CM

## 2016-12-20 DIAGNOSIS — Z95810 Presence of automatic (implantable) cardiac defibrillator: Secondary | ICD-10-CM

## 2016-12-20 NOTE — Progress Notes (Signed)
EPIC Encounter for ICM Monitoring  Patient Name: Donna [name] is a 76 y.o. female Date: 12/20/2016 Primary Care Physican: Sinda Du, MD Primary Cardiologist: Wardell Heath PA Electrophysiologist: Lovena Le Dry Weight:unknown Bi-V Pacing: 98%  Attempted call to daughter [name] [facility]).  Left detailed message regarding transmission.  Transmission reviewed.    Thoracic impedance normal .  Prescribed dosage: Torsemide 20 mg 1.5tablets (30 mg total)daily. Potassium 20 mEq 2 tablets in AM and 1 tablet in PM.   Labs: 09/24/2016 Creatinine 0.52, BUN 17, Potassium 4.2, Sodium 140, EGFR >60 09/23/2016 Creatinine 0.67, BUN 18, Potassium 2.5, Sodium 138, EGFR >60  09/20/2016 Creatinine 0.72, BUN 17, Potassium 4.0, Sodium 138, EGFR >60  08/31/2016 Creatinine 0.85, BUN 21, Potassium 4.1, Sodium 135, EGFR >60 06/08/2016 Creatinine 0.81, BUN 15, Potassium 4.6, Sodium 138, EGFR >60  Recommendations: Left voice mail with ICM number and encouraged to call for fluid symptoms.  Follow-up plan: ICM clinic phone appointment on 01/20/2017.  Office appointment scheduled 01/30/2017 with Dr. Harl Bowie.  Copy of ICM check sent to device physician.   3 month ICM trend: 12/20/2016   1 Year ICM trend:      [name], RN 12/20/2016 5:13 PM

## 2016-12-21 ENCOUNTER — Encounter: Payer: Self-pay | Admitting: Cardiology

## 2016-12-21 LAB — CUP PACEART REMOTE DEVICE CHECK
Battery Remaining Longevity: 58 mo
Battery Remaining Percentage: 71 %
Battery Voltage: 2.96 V
Brady Statistic AP VS Percent: 1 %
Date Time Interrogation Session: 20180717060019
HIGH POWER IMPEDANCE MEASURED VALUE: 51 Ohm
HIGH POWER IMPEDANCE MEASURED VALUE: 51 Ohm
Implantable Lead Implant Date: 20160614
Implantable Lead Implant Date: 20160614
Implantable Lead Location: 753858
Implantable Lead Location: 753859
Implantable Pulse Generator Implant Date: 20160614
Lead Channel Impedance Value: 360 Ohm
Lead Channel Impedance Value: 890 Ohm
Lead Channel Pacing Threshold Amplitude: 0.5 V
Lead Channel Pacing Threshold Amplitude: 0.5 V
Lead Channel Pacing Threshold Amplitude: 1.25 V
Lead Channel Pacing Threshold Pulse Width: 0.5 ms
Lead Channel Pacing Threshold Pulse Width: 0.5 ms
Lead Channel Sensing Intrinsic Amplitude: 11.7 mV
Lead Channel Setting Pacing Amplitude: 2.25 V
Lead Channel Setting Pacing Pulse Width: 0.5 ms
Lead Channel Setting Pacing Pulse Width: 0.5 ms
MDC IDC LEAD IMPLANT DT: 20160614
MDC IDC LEAD LOCATION: 753860
MDC IDC MSMT LEADCHNL LV PACING THRESHOLD PULSEWIDTH: 0.5 ms
MDC IDC MSMT LEADCHNL RA IMPEDANCE VALUE: 430 Ohm
MDC IDC MSMT LEADCHNL RA SENSING INTR AMPL: 0.8 mV
MDC IDC PG SERIAL: 1210376
MDC IDC SET LEADCHNL RA PACING AMPLITUDE: 2 V
MDC IDC SET LEADCHNL RV PACING AMPLITUDE: 2.5 V
MDC IDC SET LEADCHNL RV SENSING SENSITIVITY: 0.5 mV
MDC IDC STAT BRADY AP VP PERCENT: 80 %
MDC IDC STAT BRADY AS VP PERCENT: 18 %
MDC IDC STAT BRADY AS VS PERCENT: 1 %
MDC IDC STAT BRADY RA PERCENT PACED: 81 %

## 2016-12-22 DIAGNOSIS — R69 Illness, unspecified: Secondary | ICD-10-CM | POA: Diagnosis not present

## 2017-01-09 DIAGNOSIS — I5022 Chronic systolic (congestive) heart failure: Secondary | ICD-10-CM | POA: Diagnosis not present

## 2017-01-09 DIAGNOSIS — R69 Illness, unspecified: Secondary | ICD-10-CM | POA: Diagnosis not present

## 2017-01-09 DIAGNOSIS — E119 Type 2 diabetes mellitus without complications: Secondary | ICD-10-CM | POA: Diagnosis not present

## 2017-01-09 DIAGNOSIS — R42 Dizziness and giddiness: Secondary | ICD-10-CM | POA: Diagnosis not present

## 2017-01-20 ENCOUNTER — Telehealth: Payer: Self-pay

## 2017-01-20 ENCOUNTER — Ambulatory Visit (INDEPENDENT_AMBULATORY_CARE_PROVIDER_SITE_OTHER): Payer: Medicare HMO

## 2017-01-20 DIAGNOSIS — Z95810 Presence of automatic (implantable) cardiac defibrillator: Secondary | ICD-10-CM

## 2017-01-20 DIAGNOSIS — I5022 Chronic systolic (congestive) heart failure: Secondary | ICD-10-CM

## 2017-01-20 NOTE — Progress Notes (Signed)
EPIC Encounter for ICM Monitoring  Patient Name: Donna [name] is a 76 y.o. female Date: 01/20/2017 Primary Care Physican: Sinda Du, MD Primary Cardiologist: Wardell Heath PA Electrophysiologist: Lovena Le Dry Weight:unknown Bi-V Pacing: 98%       Attempted call to daughter [name] [facility]).  Left detailed message regarding transmission.  Transmission reviewed.    Thoracic impedance is normal but was abnormal suggesting fluid accumulation from 7/31 to 8/9.  Prescribed dosage: Torsemide 20 mg 1.5tablets (30 mg total)daily. Potassium 20 mEq 2 tablets in AM and 1 tablet in PM.   Labs: 09/24/2016 Creatinine 0.52, BUN 17, Potassium 4.2, Sodium 140, EGFR >60 09/23/2016 Creatinine 0.67, BUN 18, Potassium 2.5, Sodium 138, EGFR >60  09/20/2016 Creatinine 0.72, BUN 17, Potassium 4.0, Sodium 138, EGFR >60  08/31/2016 Creatinine 0.85, BUN 21, Potassium 4.1, Sodium 135, EGFR >60 06/08/2016 Creatinine 0.81, BUN 15, Potassium 4.6, Sodium 138, EGFR >60  Recommendations: Left voice mail with ICM number and encouraged to call for fluid symptoms.  Follow-up plan: ICM clinic phone appointment on 02/20/2017.  Office appointment scheduled 01/30/2017 with Dr. Harl Bowie.  Copy of ICM check sent to device physician.   3 month ICM trend: 01/20/2017       1 Year ICM trend:      [name], RN 01/20/2017 7:49 AM

## 2017-01-20 NOTE — Telephone Encounter (Signed)
Remote ICM transmission received.  Attempted patient call and left detailed message regarding transmission and next ICM scheduled for 02/20/2017.  Advised to return call for any fluid symptoms or questions.    

## 2017-01-30 ENCOUNTER — Encounter: Payer: Self-pay | Admitting: Cardiology

## 2017-01-30 ENCOUNTER — Encounter: Payer: Self-pay | Admitting: *Deleted

## 2017-01-30 ENCOUNTER — Ambulatory Visit (INDEPENDENT_AMBULATORY_CARE_PROVIDER_SITE_OTHER): Payer: Medicare HMO | Admitting: Cardiology

## 2017-01-30 VITALS — BP 102/60 | HR 67 | Ht 60.0 in | Wt 113.0 lb

## 2017-01-30 DIAGNOSIS — I472 Ventricular tachycardia: Secondary | ICD-10-CM | POA: Diagnosis not present

## 2017-01-30 DIAGNOSIS — I5022 Chronic systolic (congestive) heart failure: Secondary | ICD-10-CM | POA: Diagnosis not present

## 2017-01-30 DIAGNOSIS — I428 Other cardiomyopathies: Secondary | ICD-10-CM

## 2017-01-30 DIAGNOSIS — I4729 Other ventricular tachycardia: Secondary | ICD-10-CM

## 2017-01-30 NOTE — Progress Notes (Signed)
Clinical Summary Ms. Heese is a 76 y.o.female seen today for follow up of the following medical problems  1. NICM  - long history of cardiomyopathy, prior caths without obstructive disease  - LVEF 20% by echo 06/2011,  - echo 10/2014 LVEF 15-20%.  - 02/2016 echo LVEF 20-25%, grade II diastolic dysfunction - she has BiV AICD followed by Dr Lovena Le.       - home weights 109-113 lbs. Has had some recent LE edema occasionally - she does not want to wear compression stockings. - denies any significant SOB/DOE     2. Ventricular tachycardia  - followed by EP. She has BiV AICD.  - she denies any recent palpitations.        - neuro has recommended ASA 325mg  daiily.    Past Medical History:  Diagnosis Date  . AICD (automatic cardioverter/defibrillator) present   . Arthritis    "fingers" (11/18/2014)  . Atrial flutter (HCC)    Versus ventricular tachycardia; treated with amiodarone  . Cardiomyopathy, nonischemic ([place])    Nl cors in [place]; CHF in 12/97; EF of 40% in 5/01 and 7/03, 25% in 9/04 and 4/08.  systolic murmur without valvular abnormalities by echo; refused automatic implantable cardiac defibrillator  . Cerebrovascular disease    Duplex study in 12/2006 shows atherosclerosis without focal stenosis  . CHF (congestive heart failure) ([place])   . Diabetes mellitus, type II ([place])    No insulin  . Gastroesophageal reflux disease   . Hyperlipidemia   . Hypertension   . Left bundle Kaila Devries block   . Ventricular tachycardia ([place])    a. PMH it lists "atrial flutter versus ventricular tachycardia treated with amiodarone" - details of this are unclear. Notes from back to 2006 indicate she has been maintained on amiodarone but do not describe further indication. Patient's sister reports pt was told she had skipped beats that could cause her to drop dead - Dr. Lattie Haw put her on amiodarone and recommended a defibrillator.     Allergies  Allergen  Reactions  . Codeine Other (See Comments)    Reaction:  Racing heart   . Decongestant [Pseudoephedrine Hcl Er] Other (See Comments)    Reaction:  Racing heart      Current Outpatient Prescriptions  Medication Sig Dispense Refill  . acetaminophen (TYLENOL) 500 MG tablet Take 500 mg by mouth every 6 (six) hours as needed.    Marland Kitchen amiodarone (PACERONE) 200 MG tablet Take 100 mg by mouth daily at 12 noon.    Marland Kitchen aspirin EC 325 MG EC tablet Take 1 tablet (325 mg total) by mouth daily. 30 tablet 0  . atorvastatin (LIPITOR) 20 MG tablet Take 1 tablet (20 mg total) by mouth daily at 6 PM. 30 tablet 12  . Calcium Carb-Cholecalciferol (CALCIUM 600 + D) 600-200 MG-UNIT TABS Take 1 tablet by mouth daily.    . carvedilol (COREG) 6.25 MG tablet Take 1 tablet (6.25 mg total) by mouth 2 (two) times daily. 60 tablet 12  . donepezil (ARICEPT) 10 MG tablet Take 0.5 tablets (5 mg total) by mouth at bedtime. After 1 wk if tolerated, increase to 1 tablet (10 mg total) by mouth at bedtime. 30 tablet 11  . insulin detemir (LEVEMIR) 100 UNIT/ML injection Inject 12 Units into the skin at bedtime.    . IRON PO Take 1 tablet by mouth daily.    Marland Kitchen levothyroxine (SYNTHROID, LEVOTHROID) 75 MCG tablet Take 75 mcg by mouth daily before breakfast.    .  loratadine (CLARITIN) 10 MG tablet Take 10 mg by mouth daily as needed for allergies.    Marland Kitchen losartan (COZAAR) 25 MG tablet Take 0.5 tablets (12.5 mg total) by mouth daily. 90 tablet 3  . Omega-3 Fatty Acids (FISH OIL PO) Take by mouth 3 (three) times daily.    . potassium chloride SA (KLOR-CON M20) 20 MEQ tablet Take 2 Tablets In The Morning And Take 1 Tablet In The Evening 90 tablet 3  . sitaGLIPtin (JANUVIA) 100 MG tablet Take 100 mg by mouth daily.    Marland Kitchen torsemide (DEMADEX) 20 MG tablet Take 1.5 tablets (30 mg total) by mouth daily. (Patient taking differently: Take 40 mg by mouth daily. ) 180 tablet 3  . UNIFINE PENTIPS 32G X 4 MM MISC USE TWICE A DAY TO TAKE INSULIN. 100 each 2     No current facility-administered medications for this visit.      Past Surgical History:  Procedure Laterality Date  . ABDOMINAL HYSTERECTOMY  2006  . BI-VENTRICULAR IMPLANTABLE CARDIOVERTER DEFIBRILLATOR  (CRT-D)  11/18/2014  . COLONOSCOPY  2008  . EP IMPLANTABLE DEVICE N/A 11/18/2014   Procedure: BiV ICD Insertion CRT-D;  Surgeon: Evans Lance, MD;  Location: [facility];  Service: Cardiovascular;  Laterality: N/A;  . HERNIA REPAIR Right   . TONSILLECTOMY       Allergies  Allergen Reactions  . Codeine Other (See Comments)    Reaction:  Racing heart   . Decongestant [Pseudoephedrine Hcl Er] Other (See Comments)    Reaction:  Racing heart       Family History  Problem Relation Age of Onset  . Diabetes Mother   . Kidney disease Mother   . Heart failure Mother   . Stomach cancer Father   . Hypertension Father   . Diabetes Sister   . Seizures Sister   . Heart attack Brother   . Seizures Brother   . Dementia Neg Hx      Social History Ms. Mceachin reports that she has never smoked. She has never used smokeless tobacco. Ms. Rhymes reports that she does not drink alcohol.   Review of Systems CONSTITUTIONAL: No weight loss, fever, chills, weakness or fatigue.  HEENT: Eyes: No visual loss, blurred vision, double vision or yellow sclerae.No hearing loss, sneezing, congestion, runny nose or sore throat.  SKIN: No rash or itching.  CARDIOVASCULAR: per hpi RESPIRATORY: per hpi GASTROINTESTINAL: No anorexia, nausea, vomiting or diarrhea. No abdominal pain or blood.  GENITOURINARY: No burning on urination, no polyuria NEUROLOGICAL: No headache, dizziness, syncope, paralysis, ataxia, numbness or tingling in the extremities. No change in bowel or bladder control.  MUSCULOSKELETAL: No muscle, back pain, joint pain or stiffness.  LYMPHATICS: No enlarged nodes. No history of splenectomy.  PSYCHIATRIC: No history of depression or anxiety.  ENDOCRINOLOGIC: No reports  of sweating, cold or heat intolerance. No polyuria or polydipsia.  Marland Kitchen   Physical Examination Vitals:   01/30/17 1331  BP: 102/60  Pulse: 67  SpO2: 97%   Vitals:   01/30/17 1331  Weight: 113 lb (51.3 kg)  Height: 5' (1.524 m)    Gen: resting comfortably, no acute distress HEENT: no scleral icterus, pupils equal round and reactive, no palptable cervical adenopathy,  CV: RRR, no m/r/g, no jvd Resp: Clear to auscultation bilaterally GI: abdomen is soft, non-tender, non-distended, normal bowel sounds, no hepatosplenomegaly MSK: extremities are warm, no edema.  Skin: warm, no rash Neuro:  no focal deficits Psych: appropriate affect  Diagnostic Studies 06/2011 Echo LVEF 20%, mildly dilated, mild LVH, grade I diastolic dysfunction, mild to mod MR, PASP 36   10/04/13 Echo Study Conclusions  - Left ventricle: Left ventricular systolic function is severely reduced, EF 15-20%. Severe global hypokinesis to akinesis is seen. The cavity size was moderately to severely dilated. Wall thickness was increased in a pattern of mild LVH. Doppler parameters are consistent with abnormal left ventricular relaxation (grade 1 diastolic dysfunction). Doppler parameters are consistent with high ventricular filling pressure. - Regional wall motion abnormality: Akinesis of the mid anterior, mid anteroseptal, basal-mid inferoseptal, apical septal, and apical myocardium; severe hypokinesis of the mid inferior and apical lateral myocardium; moderate hypokinesis of the mid inferolateral and basal-mid anterolateral myocardium. - Aortic valve: Mildly calcified annulus. Mildly thickened leaflets. - Mitral valve: Mildly dilated annulus. Mildly thickened leaflets . Mild to moderate regurgitation. - Left atrium: The atrium was moderately dilated. - Tricuspid valve: Mild regurgitation. - Pulmonary arteries: PA peak pressure: 4mm Hg (S). Mildly elevated pulmonary pressures. - Systemic veins: IVC is  dilated, with normal respiratory variation. Estimated CVP 8 mmHg. - Pericardium, extracardiac: A trivial pericardial effusion was identified posterior to the heart.  10/2014 Echo Study Conclusions  - Left ventricle: There is global hypokinesis with akinesis of the anterior, inferior walls and paradoxical septal motion. Overall LVEF is severely refused estimated at 15-20%. The cavity size was mildly dilated. There was moderate concentric hypertrophy. Systolic function was severely reduced. The estimated ejection fraction was in the range of 15-20%. Features are consistent with a pseudonormal left ventricular filling pattern, with concomitant abnormal relaxation and increased filling pressure (grade 2 diastolic dysfunction). Doppler parameters are consistent with elevated ventricular end-diastolic filling pressure. - Ventricular septum: Septal motion showed paradox. - Aortic valve: Trileaflet; normal thickness leaflets. There was no regurgitation. - Aortic root: The aortic root was normal in size. - Mitral valve: Structurally normal valve. There was moderate regurgitation. - Left atrium: The atrium was normal in size. - Right ventricle: Systolic function was mildly reduced. - Right atrium: The atrium was normal in size. - Tricuspid valve: There was mild regurgitation. - Pulmonary arteries: Systolic pressure was within the normal range. PA peak pressure: 34 mm Hg (S). - Inferior vena cava: The vessel was normal in size. - Pericardium, extracardiac: A trivial pericardial effusion was identified.    Assessment and Plan  1. NICM/Chronic systolic HF  - weights are stable, overall appears euvolemic in clinic - continue current meds   2. Ventricular tachycardia  - no recent symptoms, we will continue to monitor   F/u 4 months      Arnoldo Lenis, M.D.

## 2017-01-30 NOTE — Patient Instructions (Signed)
Medication Instructions:  Your physician recommends that you continue on your current medications as directed. Please refer to the Current Medication list given to you today.   Labwork: NONE  Testing/Procedures: NONE  Follow-Up: Your physician recommends that you schedule a follow-up appointment in: 4 Months with Dr. Harl Bowie   Any Other Special Instructions Will Be Listed Below (If Applicable). I will get labs from Dr. Luan Pulling    If you need a refill on your cardiac medications before your next appointment, please call your pharmacy.  Thank you for choosing [facility]!

## 2017-02-16 DIAGNOSIS — R69 Illness, unspecified: Secondary | ICD-10-CM | POA: Diagnosis not present

## 2017-02-20 ENCOUNTER — Telehealth: Payer: Self-pay

## 2017-02-20 ENCOUNTER — Ambulatory Visit (INDEPENDENT_AMBULATORY_CARE_PROVIDER_SITE_OTHER): Payer: Medicare HMO

## 2017-02-20 DIAGNOSIS — I5022 Chronic systolic (congestive) heart failure: Secondary | ICD-10-CM

## 2017-02-20 DIAGNOSIS — Z95810 Presence of automatic (implantable) cardiac defibrillator: Secondary | ICD-10-CM | POA: Diagnosis not present

## 2017-02-20 NOTE — Telephone Encounter (Signed)
Remote ICM transmission received.  Attempted call to daughter, [name] and left detailed message regarding transmission and next ICM scheduled for 03/23/2017.  Advised to return call for any fluid symptoms or questions.

## 2017-02-20 NOTE — Progress Notes (Signed)
EPIC Encounter for ICM Monitoring  Patient Name: Donna [name] is a 76 y.o. female Date: 02/20/2017 Primary Care Physican: Sinda Du, MD Primary Cardiologist: Wardell Heath PA Electrophysiologist: Lovena Le Dry Weight:unknown Bi-V Pacing: 98%       Attempted call to daughter Donna [name] [facility]).    Left detailed message regarding transmission.  Transmission reviewed.    Thoracic impedance normal but was abnormal suggesting fluid accumulation from 01/23/2017 - 02/07/2017 (15 days).  Prescribed dosage: Torsemide 20 mg 1.5tablets (30 mg total)daily. Potassium 20 mEq 2 tablets in AM and 1 tablet in PM.   Labs: 09/24/2016 Creatinine 0.52, BUN 17, Potassium 4.2, Sodium 140, EGFR >60 09/23/2016 Creatinine 0.67, BUN 18, Potassium 2.5, Sodium 138, EGFR >60  09/20/2016 Creatinine 0.72, BUN 17, Potassium 4.0, Sodium 138, EGFR >60  08/31/2016 Creatinine 0.85, BUN 21, Potassium 4.1, Sodium 135, EGFR >60 06/08/2016 Creatinine 0.81, BUN 15, Potassium 4.6, Sodium 138, EGFR >60  Recommendations: Left voice mail with ICM number and encouraged to call for fluid symptoms.  Follow-up plan: ICM clinic phone appointment on 03/23/2017.    Copy of ICM check sent to Dr. Lovena Le.   3 month ICM trend: 02/20/2017   1 Year ICM trend:      Donna Billings, RN 02/20/2017 1:23 PM

## 2017-03-03 ENCOUNTER — Encounter (HOSPITAL_COMMUNITY): Payer: Self-pay | Admitting: *Deleted

## 2017-03-03 ENCOUNTER — Emergency Department (HOSPITAL_COMMUNITY): Payer: Medicare HMO

## 2017-03-03 ENCOUNTER — Emergency Department (HOSPITAL_COMMUNITY)
Admission: EM | Admit: 2017-03-03 | Discharge: 2017-03-03 | Disposition: A | Discharge status: Home / Self Care | Payer: Medicare HMO | Attending: Emergency Medicine | Admitting: Emergency Medicine

## 2017-03-03 DIAGNOSIS — N390 Urinary tract infection, site not specified: Secondary | ICD-10-CM | POA: Insufficient documentation

## 2017-03-03 DIAGNOSIS — Z794 Long term (current) use of insulin: Secondary | ICD-10-CM | POA: Diagnosis not present

## 2017-03-03 DIAGNOSIS — R42 Dizziness and giddiness: Secondary | ICD-10-CM | POA: Diagnosis present

## 2017-03-03 DIAGNOSIS — Z7982 Long term (current) use of aspirin: Secondary | ICD-10-CM | POA: Insufficient documentation

## 2017-03-03 DIAGNOSIS — W06XXXA Fall from bed, initial encounter: Secondary | ICD-10-CM | POA: Diagnosis not present

## 2017-03-03 DIAGNOSIS — I5022 Chronic systolic (congestive) heart failure: Secondary | ICD-10-CM | POA: Diagnosis not present

## 2017-03-03 DIAGNOSIS — S0990XA Unspecified injury of head, initial encounter: Secondary | ICD-10-CM | POA: Diagnosis not present

## 2017-03-03 DIAGNOSIS — Z79899 Other long term (current) drug therapy: Secondary | ICD-10-CM | POA: Diagnosis not present

## 2017-03-03 DIAGNOSIS — I517 Cardiomegaly: Secondary | ICD-10-CM | POA: Diagnosis not present

## 2017-03-03 DIAGNOSIS — E11649 Type 2 diabetes mellitus with hypoglycemia without coma: Secondary | ICD-10-CM | POA: Insufficient documentation

## 2017-03-03 DIAGNOSIS — Y92003 Bedroom of unspecified non-institutional (private) residence as the place of occurrence of the external cause: Secondary | ICD-10-CM | POA: Insufficient documentation

## 2017-03-03 DIAGNOSIS — R9431 Abnormal electrocardiogram [ECG] [EKG]: Secondary | ICD-10-CM | POA: Diagnosis not present

## 2017-03-03 DIAGNOSIS — I110 Hypertensive heart disease with heart failure: Secondary | ICD-10-CM | POA: Insufficient documentation

## 2017-03-03 DIAGNOSIS — Z95810 Presence of automatic (implantable) cardiac defibrillator: Secondary | ICD-10-CM | POA: Diagnosis not present

## 2017-03-03 DIAGNOSIS — E039 Hypothyroidism, unspecified: Secondary | ICD-10-CM | POA: Insufficient documentation

## 2017-03-03 DIAGNOSIS — E162 Hypoglycemia, unspecified: Secondary | ICD-10-CM

## 2017-03-03 LAB — CBG MONITORING, ED
GLUCOSE-CAPILLARY: 149 mg/dL — AB (ref 65–99)
Glucose-Capillary: 160 mg/dL — ABNORMAL HIGH (ref 65–99)

## 2017-03-03 LAB — CBC WITH DIFFERENTIAL/PLATELET
BASOS ABS: 0 10*3/uL (ref 0.0–0.1)
BASOS PCT: 0 %
Eosinophils Absolute: 0.1 10*3/uL (ref 0.0–0.7)
Eosinophils Relative: 2 %
HEMATOCRIT: 42 % (ref 36.0–46.0)
Hemoglobin: 13.3 g/dL (ref 12.0–15.0)
Lymphocytes Relative: 16 %
Lymphs Abs: 1.2 10*3/uL (ref 0.7–4.0)
MCH: 31.5 pg (ref 26.0–34.0)
MCHC: 31.7 g/dL (ref 30.0–36.0)
MCV: 99.5 fL (ref 78.0–100.0)
MONO ABS: 0.5 10*3/uL (ref 0.1–1.0)
MONOS PCT: 7 %
NEUTROS ABS: 5.3 10*3/uL (ref 1.7–7.7)
Neutrophils Relative %: 75 %
PLATELETS: 134 10*3/uL — AB (ref 150–400)
RBC: 4.22 MIL/uL (ref 3.87–5.11)
RDW: 16.8 % — AB (ref 11.5–15.5)
WBC: 7 10*3/uL (ref 4.0–10.5)

## 2017-03-03 LAB — URINALYSIS, ROUTINE W REFLEX MICROSCOPIC
Bilirubin Urine: NEGATIVE
Glucose, UA: NEGATIVE mg/dL
HGB URINE DIPSTICK: NEGATIVE
KETONES UR: NEGATIVE mg/dL
Nitrite: NEGATIVE
PROTEIN: 30 mg/dL — AB
SQUAMOUS EPITHELIAL / LPF: NONE SEEN
Specific Gravity, Urine: 1.016 (ref 1.005–1.030)
pH: 6 (ref 5.0–8.0)

## 2017-03-03 LAB — BASIC METABOLIC PANEL
Anion gap: 10 (ref 5–15)
BUN: 21 mg/dL — AB (ref 6–20)
CALCIUM: 8.9 mg/dL (ref 8.9–10.3)
CO2: 29 mmol/L (ref 22–32)
Chloride: 100 mmol/L — ABNORMAL LOW (ref 101–111)
Creatinine, Ser: 0.83 mg/dL (ref 0.44–1.00)
GFR calc Af Amer: >60 mL/min (ref >60)
GLUCOSE: 44 mg/dL — AB (ref 65–99)
Potassium: 4.3 mmol/L (ref 3.5–5.1)
Sodium: 139 mmol/L (ref 135–145)

## 2017-03-03 LAB — TROPONIN I: TROPONIN I: 0.03 ng/mL — AB (ref <0.03)

## 2017-03-03 MED ORDER — DEXTROSE 5 % IV SOLN
1.0000 g | Freq: Once | INTRAVENOUS | Status: AC
  Administered 2017-03-03: 1 g via INTRAVENOUS
  Filled 2017-03-03: qty 10

## 2017-03-03 MED ORDER — SULFAMETHOXAZOLE-TRIMETHOPRIM 800-160 MG PO TABS: 1.0000 | ORAL_TABLET | Freq: Two times a day (BID) | ORAL | 0 refills | Status: AC

## 2017-03-03 MED ORDER — IPRATROPIUM-ALBUTEROL 0.5-2.5 (3) MG/3ML IN SOLN
3.0000 mL | Freq: Once | RESPIRATORY_TRACT | Status: AC
  Administered 2017-03-03: 3 mL via RESPIRATORY_TRACT
  Filled 2017-03-03: qty 3

## 2017-03-03 MED ORDER — SODIUM CHLORIDE 0.9 % IV BOLUS (SEPSIS)
250.0000 mL | Freq: Once | INTRAVENOUS | Status: AC
  Administered 2017-03-03: 250 mL via INTRAVENOUS

## 2017-03-03 NOTE — ED Notes (Signed)
Blood Glucose 44 and Troponin 0.03 reported to provider. Juice given to patient.

## 2017-03-03 NOTE — Discharge Instructions (Signed)
Try to drink plenty of fluids. Take the antibiotic as directed until it's finished. Monitor her blood sugar closely and follow up with her primary doctor on Monday for recheck. Return to the ER for any worsening symptoms.

## 2017-03-03 NOTE — ED Provider Notes (Signed)
[facility] DEPT Provider Note   CSN: [id number] Arrival date & time: 03/03/17  0915     History   Chief Complaint Chief Complaint  Patient presents with  . Fall    HPI Donna [name] is a 76 y.o. female.  HPI  Donna [name] is a 76 y.o. female who presents to the Emergency Department with his daughter. Patient states that she felt okay last evening when she went to bed but broke up this morning with her grandson picking her up off the floor. She is unclear of the events in between. Daughter states that her son heard her fall out of bed and went into her room and found her lying on the floor shaking all over. Incident occurred around 8 AM this morning. EMS was called to the home but initially patient declined to come to the hospital. Daughter states her blood sugar at home was 65. Patient complains of dizziness at present denies pain or other symptoms.   Past Medical History:  Diagnosis Date  . AICD (automatic cardioverter/defibrillator) present   . Arthritis    "fingers" (11/18/2014)  . Atrial flutter (HCC)    Versus ventricular tachycardia; treated with amiodarone  . Cardiomyopathy, nonischemic ([place])    Nl cors in [place]; CHF in 12/97; EF of 40% in 5/01 and 7/03, 25% in 9/04 and 4/08.  systolic murmur without valvular abnormalities by echo; refused automatic implantable cardiac defibrillator  . Cerebrovascular disease    Duplex study in 12/2006 shows atherosclerosis without focal stenosis  . CHF (congestive heart failure) ([place])   . Diabetes mellitus, type II ([place])    No insulin  . Gastroesophageal reflux disease   . Hyperlipidemia   . Hypertension   . Left bundle branch block   . Ventricular tachycardia ([place])    a. PMH it lists "atrial flutter versus ventricular tachycardia treated with amiodarone" - details of this are unclear. Notes from back to 2006 indicate she has been maintained on amiodarone but do not describe further indication. Patient's sister  reports pt was told she had skipped beats that could cause her to drop dead - Dr. Lattie Haw put her on amiodarone and recommended a defibrillator.    Patient Active Problem List   Diagnosis Date Noted  . Cerebrovascular disease 09/20/2016  . AICD (automatic cardioverter/defibrillator) present 09/20/2016  . CHF (congestive heart failure) ([place]) 09/20/2016  . Acute CVA (cerebrovascular accident) ([place]) 09/20/2016  . Confusion 06/27/2016  . Nausea 03/09/2016  . Dizziness 02/03/2016  . Fall on flat surface, tripped on rug; no injury 01/19/2016    Class: Acute  . Acute on chronic systolic CHF (congestive heart failure) ([place]) 12/29/2015  . Hypokalemia 05/18/2015  . Acute on chronic systolic congestive heart failure ([place]) 05/16/2015  . Acute on chronic systolic (congestive) heart failure ([place]) 05/16/2015  . Chronic systolic heart failure ([place]) 11/18/2014  . NICM (nonischemic cardiomyopathy) ([place]) 11/18/2014  . Acute on chronic systolic heart failure ([place]) 10/20/2014  . Acute respiratory failure with hypoxia ([place]) 10/18/2014  . CHF exacerbation ([place]) 07/22/2014  . Acute exacerbation of CHF (congestive heart failure) ([place]) 07/22/2014  . Abnormality of gait 09/26/2013  . Weakness of left hip 09/26/2013  . Diabetes mellitus, type II ([place]) 06/15/2011  . Cardiomyopathy, nonischemic ([place])   . Hypertension   . Hypothyroidism 05/28/2009  . Left bundle branch block 05/28/2009  . GASTROESOPHAGEAL REFLUX DISEASE 05/28/2009  . Hyperlipidemia 05/14/2008  . VENTRICULAR TACHYCARDIA 05/14/2008  . CEREBROVASCULAR DISEASE 05/14/2008  Past Surgical History:  Procedure Laterality Date  . ABDOMINAL HYSTERECTOMY  2006  . BI-VENTRICULAR IMPLANTABLE CARDIOVERTER DEFIBRILLATOR  (CRT-D)  11/18/2014  . COLONOSCOPY  2008  . EP IMPLANTABLE DEVICE N/A 11/18/2014   Procedure: BiV ICD Insertion CRT-D;  Surgeon: Evans Lance, MD;  Location: [facility];  Service: Cardiovascular;  Laterality: N/A;  . HERNIA  REPAIR Right   . TONSILLECTOMY      OB History    Gravida Para Term Preterm AB Living             2   SAB TAB Ectopic Multiple Live Births                   Home Medications    Prior to Admission medications   Medication Sig Start Date End Date Taking? Authorizing Provider  acetaminophen (TYLENOL) 500 MG tablet Take 500 mg by mouth every 6 (six) hours as needed.    [provider]  amiodarone (PACERONE) 200 MG tablet Take 100 mg by mouth daily at 12 noon.    [provider]  aspirin EC 325 MG EC tablet Take 1 tablet (325 mg total) by mouth daily. 09/25/16   Sinda Du, MD  atorvastatin (LIPITOR) 20 MG tablet Take 1 tablet (20 mg total) by mouth daily at 6 PM. 07/24/14   Sinda Du, MD  Calcium Carb-Cholecalciferol (CALCIUM 600 + D) 600-200 MG-UNIT TABS Take 1 tablet by mouth daily.    [provider]  carvedilol (COREG) 6.25 MG tablet Take 1 tablet (6.25 mg total) by mouth 2 (two) times daily. 09/24/16   Sinda Du, MD  insulin detemir (LEVEMIR) 100 UNIT/ML injection Inject 12 Units into the skin at bedtime.    Sinda Du, MD  IRON PO Take 1 tablet by mouth daily.    [provider]  levothyroxine (SYNTHROID, LEVOTHROID) 75 MCG tablet Take 75 mcg by mouth daily before breakfast.    [provider]  loratadine (CLARITIN) 10 MG tablet Take 10 mg by mouth daily as needed for allergies.    [provider]  losartan (COZAAR) 25 MG tablet Take 0.5 tablets (12.5 mg total) by mouth daily. 06/29/16 08/31/17  Imogene Burn, PA-C  Omega-3 Fatty Acids (FISH OIL PO) Take by mouth 3 (three) times daily.    [provider]  potassium chloride SA (KLOR-CON M20) 20 MEQ tablet Take 2 Tablets In The Morning And Take 1 Tablet In The Evening 06/29/16   Imogene Burn, PA-C  sitaGLIPtin (JANUVIA) 100 MG tablet Take 100 mg by mouth daily.    [provider]  torsemide (DEMADEX) 20 MG tablet Take 1.5 tablets (30 mg  total) by mouth daily. Patient taking differently: Take 40 mg by mouth daily.  09/24/16   Sinda Du, MD  UNIFINE PENTIPS 32G X 4 MM MISC USE TWICE A DAY TO TAKE INSULIN. 01/06/16   Cassandria Anger, MD    Family History Family History  Problem Relation Age of Onset  . Diabetes Mother   . Kidney disease Mother   . Heart failure Mother   . Stomach cancer Father   . Hypertension Father   . Diabetes Sister   . Seizures Sister   . Heart attack Brother   . Seizures Brother   . Dementia Neg Hx     Social History Social History  Substance Use Topics  . Smoking status: Never Smoker  . Smokeless tobacco: Never Used  . Alcohol use No  Allergies   Codeine and Decongestant [pseudoephedrine hcl er]   Review of Systems Review of Systems  Constitutional: Negative for appetite change, chills and fever.  Eyes: Positive for visual disturbance ("blurry").  Respiratory: Negative for chest tightness and shortness of breath.   Cardiovascular: Negative for chest pain.  Gastrointestinal: Negative for abdominal pain, nausea and vomiting.  Genitourinary: Negative for difficulty urinating and dysuria.  Musculoskeletal: Negative for arthralgias, back pain, joint swelling and neck pain.  Skin: Negative for rash and wound.  Neurological: Positive for dizziness. Negative for seizures, facial asymmetry, weakness, light-headedness, numbness and headaches.  Psychiatric/Behavioral: Negative for confusion.     Physical Exam Updated Vital Signs BP 120/69 (BP Location: Left Arm)   Pulse 72   Resp 18   SpO2 99%   Physical Exam  Constitutional: She is oriented to person, place, and time. She appears well-developed and well-nourished. No distress.  HENT:  Head: Atraumatic.  Mouth/Throat: Oropharynx is clear and moist.  Eyes: Pupils are equal, round, and reactive to light. Conjunctivae and EOM are normal.  Neck: Normal range of motion. Neck supple. No spinous process tenderness and no  muscular tenderness present.  Cardiovascular: Normal rate, regular rhythm and intact distal pulses.   No murmur heard. Pulmonary/Chest: Effort normal and breath sounds normal. No respiratory distress. She exhibits no tenderness.  Abdominal: Soft. She exhibits no distension and no mass. There is no tenderness. There is no guarding.  Neurological: She is alert and oriented to person, place, and time. She has normal strength. No sensory deficit. Coordination normal. GCS eye subscore is 4. GCS verbal subscore is 5. GCS motor subscore is 6.  CN II-XII intact, no pronator drift, no facial droop  Skin: Skin is warm. Capillary refill takes less than 2 seconds.  Psychiatric: She has a normal mood and affect.  Nursing note and vitals reviewed.    ED Treatments / Results  Labs (all labs ordered are listed, but only abnormal results are displayed) Labs Reviewed  URINE CULTURE - Abnormal; Notable for the following:       Result Value   Culture >=100,000 COLONIES/mL ESCHERICHIA COLI (*)    All other components within normal limits  TROPONIN I - Abnormal; Notable for the following:    Troponin I 0.03 (*)    All other components within normal limits  BASIC METABOLIC PANEL - Abnormal; Notable for the following:    Chloride 100 (*)    Glucose, Bld 44 (*)    BUN 21 (*)    All other components within normal limits  CBC WITH DIFFERENTIAL/PLATELET - Abnormal; Notable for the following:    RDW 16.8 (*)    Platelets 134 (*)    All other components within normal limits  URINALYSIS, ROUTINE W REFLEX MICROSCOPIC - Abnormal; Notable for the following:    APPearance CLOUDY (*)    Protein, ur 30 (*)    Leukocytes, UA LARGE (*)    Bacteria, UA MANY (*)    All other components within normal limits  CBG MONITORING, ED - Abnormal; Notable for the following:    Glucose-Capillary 160 (*)    All other components within normal limits  CBG MONITORING, ED - Abnormal; Notable for the following:     Glucose-Capillary 149 (*)    All other components within normal limits    EKG  EKG Interpretation  Date/Time:  Friday March 03 2017 09:32:33 EDT Ventricular Rate:  62 PR Interval:    QRS Duration: 153 QT Interval:  513  QTC Calculation: 521 R Axis:   -87 Text Interpretation:  Atrial-ventricular dual-paced rhythm No further analysis attempted due to paced rhythm since last tracing no significant change Confirmed by Noemi Chapel [phone]) on 03/03/2017 10:00:10 AM       Radiology Dg Chest 1 View  Result Date: 03/03/2017 CLINICAL DATA:  Syncope/ loss of consciousness. EXAM: CHEST 1 VIEW COMPARISON:  And 12/04/2016 and prior radiograph FINDINGS: Cardiomegaly and left ICD again noted. Mild interstitial prominence is unchanged. There is no evidence of focal airspace disease, pulmonary edema, suspicious pulmonary nodule/mass, pleural effusion, or pneumothorax. No acute bony abnormalities are identified. IMPRESSION: Cardiomegaly without evidence of acute cardiopulmonary disease. Electronically Signed   By: Margarette Canada M.D.   On: 03/03/2017 11:15   Ct Head Wo Contrast  Result Date: 03/03/2017 CLINICAL DATA:  Per ED notes: Pt comes in with family. States they heard her fall out of the bed this morning and when they went in to check on her she was shaking in the floor. Pt states she remembers family waking her up. Patient feels dizzy. Denies pain. EXAM: CT HEAD WITHOUT CONTRAST TECHNIQUE: Contiguous axial images were obtained from the base of the skull through the vertex without intravenous contrast. COMPARISON:  12/04/2016 FINDINGS: Brain: Moderate bilateral subdural hygromas are again identified. On the right, collection measures 13 mm. On the left, 10 mm. Collections are slightly larger bilaterally compared with the prior study and demonstrate increased mass effect on the gyri. There is no high attenuation material to indicate acute subdural hemorrhage. There is central and cortical atrophy. No  acute infarct or hemorrhage. There is a 0.8 x 1.3 cm calcified extra-axial mass within the left anterior parietal region, adjacent to the inner table of the skull, best seen on images 23 and 55. There is no associated mass effect or edema. Vascular: There is extensive atherosclerotic calcification of the internal carotid arteries. Skull: No acute fracture. Sinuses/Orbits: Mild mucosal thickening of the ethmoid sinuses. Orbits are intact. Other: None IMPRESSION: 1. Subdural hygroma is slightly larger compared to prior studies with increased mass effect on the gyri in the frontal lobe regions. 2. No evidence for acute hemorrhage. 3. Meningioma versus meningeal calcification in the parietal lobe, showing long-term stability and without suspicious features. 4. Atrophy.  No acute infarct. 5. No acute fracture. Electronically Signed   By: Nolon Nations M.D.   On: 03/03/2017 11:31     Procedures Procedures (including critical care time)  Medications Ordered in ED Medications  sodium chloride 0.9 % bolus 250 mL (0 mLs Intravenous Stopped 03/03/17 1229)  cefTRIAXone (ROCEPHIN) 1 g in dextrose 5 % 50 mL IVPB (0 g Intravenous Stopped 03/03/17 1206)  ipratropium-albuterol (DUONEB) 0.5-2.5 (3) MG/3ML nebulizer solution 3 mL (3 mLs Nebulization Given 03/03/17 1410)     Initial Impression / Assessment and Plan / ED Course  I have reviewed the triage vital signs and the nursing notes.  Pertinent labs & imaging results that were available during my care of the patient were reviewed by me and considered in my medical decision making (see chart for details).    Pt well appearing. Afebrile. Sx's felt to be related to UTI and hypoglycemia.  Will have pt eat, drink fluids and reassess.   After pt returned from CT, I was informed by nursing that sats dropped, started on 2L O2, sats rose to 90's.  Pt feeling better, but reports shortness of breath and wheezing.  On my repeat exam pt does have expiratory wheezes,  denies CP. Ordered albuterol neb. No peripheral edema.  Discussed findings with Dr. Sabra Heck.  On recheck after neb, lung sounds improved, wheezing resolved, no crackles. No further hypoglycemia.  Pt ambulated in the dept w/o symptoms, sat remained 93% on RA.  Pt prefers d/c home, daughter and pt agree to close PCP f/u, strict return precautions discussed.  Final Clinical Impressions(s) / ED Diagnoses   Final diagnoses:  Urinary tract infection in elderly patient  Hypoglycemia    New Prescriptions New Prescriptions   No medications on file     Bufford Lope 03/05/17 2322    Noemi Chapel, MD 03/09/17 [phone]

## 2017-03-03 NOTE — ED Triage Notes (Signed)
Pt comes in with family. States they heard her fall out of the bed this morning and when they went in to check on her she was shaking in the floor. Pt states she remembers family waking her up. They called EMS but didn't want to come in. Pt denies pain at this time but states she feels dizzy.

## 2017-03-03 NOTE — ED Notes (Signed)
Pt walked to bathroom oxygen was 93. Pt was using cane and had no troubles walking.

## 2017-03-06 LAB — URINE CULTURE: Culture: >=100000 — AB

## 2017-03-07 ENCOUNTER — Telehealth: Payer: Self-pay | Admitting: Emergency Medicine

## 2017-03-07 NOTE — Telephone Encounter (Signed)
Post ED Visit - Positive Culture Follow-up  Culture report reviewed by antimicrobial stewardship pharmacist:  []  Elenor Quinones, Pharm.D. []  Heide Guile, Pharm.D., BCPS AQ-ID []  Parks Neptune, Pharm.D., BCPS []  Alycia Rossetti, Pharm.D., BCPS []  [place], Pharm.D., BCPS, AAHIVP []  Legrand Como, Pharm.D., BCPS, AAHIVP []  Salome Arnt, PharmD, BCPS []  Dimitri Ped, PharmD, BCPS []  Vincenza Hews, PharmD, BCPS  Positive urine culture Treated with sulfamethoxazole-trimethoprim, organism sensitive to the same and no further patient follow-up is required at this time.  Hazle Nordmann 03/07/2017, 3:35 PM

## 2017-03-15 ENCOUNTER — Ambulatory Visit: Payer: Self-pay | Admitting: Adult Health

## 2017-03-16 ENCOUNTER — Encounter: Payer: Self-pay | Admitting: Adult Health

## 2017-03-20 DIAGNOSIS — E119 Type 2 diabetes mellitus without complications: Secondary | ICD-10-CM | POA: Diagnosis not present

## 2017-03-20 DIAGNOSIS — I499 Cardiac arrhythmia, unspecified: Secondary | ICD-10-CM | POA: Diagnosis not present

## 2017-03-20 DIAGNOSIS — R42 Dizziness and giddiness: Secondary | ICD-10-CM | POA: Diagnosis not present

## 2017-03-20 DIAGNOSIS — I5022 Chronic systolic (congestive) heart failure: Secondary | ICD-10-CM | POA: Diagnosis not present

## 2017-03-20 DIAGNOSIS — Z23 Encounter for immunization: Secondary | ICD-10-CM | POA: Diagnosis not present

## 2017-03-23 ENCOUNTER — Ambulatory Visit (INDEPENDENT_AMBULATORY_CARE_PROVIDER_SITE_OTHER): Payer: Medicare HMO | Admitting: *Deleted

## 2017-03-23 DIAGNOSIS — I472 Ventricular tachycardia: Secondary | ICD-10-CM

## 2017-03-23 DIAGNOSIS — I5022 Chronic systolic (congestive) heart failure: Secondary | ICD-10-CM

## 2017-03-23 DIAGNOSIS — Z95810 Presence of automatic (implantable) cardiac defibrillator: Secondary | ICD-10-CM

## 2017-03-23 DIAGNOSIS — I4729 Other ventricular tachycardia: Secondary | ICD-10-CM

## 2017-03-23 NOTE — Progress Notes (Signed)
EPIC Encounter for ICM Monitoring  Patient Name: Donna [name] is a 76 y.o. female Date: 03/23/2017 Primary Care Physican: Sinda Du, MD Primary Cardiologist: Wardell Heath PA Electrophysiologist: Lovena Le Dry Weight:unknown Bi-V Pacing: 98%                                          Attempted call to daughter Donna [name] [name]).    Left detailed message regarding transmission.  Transmission reviewed.    Thoracic impedance normal but was abnormal suggesting fluid accumulation for 12 days in last month.  Prescribed dosage: Torsemide 20 mg 1.5tablets (30 mg total)daily. Potassium 20 mEq 2 tablets in AM and 1 tablet in PM.   Labs: 03/03/2017 Creatinine 0.83, BUN 21, Potassium 4.3, Sodium 139, EGFR >60 12/04/2016 Creatinine 0.85, BUN 15, Potassium 3.8, Sodium 145, EGFR>60 09/24/2016 Creatinine 0.52, BUN 17, Potassium 4.2, Sodium 140, EGFR >60 09/23/2016 Creatinine 0.67, BUN 18, Potassium 2.5, Sodium 138, EGFR >60  09/20/2016 Creatinine 0.72, BUN 17, Potassium 4.0, Sodium 138, EGFR >60  08/31/2016 Creatinine 0.85, BUN 21, Potassium 4.1, Sodium 135, EGFR >60 06/08/2016 Creatinine 0.81, BUN 15, Potassium 4.6, Sodium 138, EGFR >60  Recommendations: Left voice mail with ICM number and encouraged to call if experiencing any fluid symptoms.  Follow-up plan: ICM clinic phone appointment on 04/24/2017.    Copy of ICM check sent to Dr. Lovena Le.   3 month ICM trend: 03/23/2017   1 Year ICM trend:      Donna Billings, RN 03/23/2017 1:55 PM

## 2017-03-23 NOTE — Progress Notes (Signed)
Remote ICD transmission.   

## 2017-03-24 ENCOUNTER — Other Ambulatory Visit (HOSPITAL_COMMUNITY)
Admission: RE | Admit: 2017-03-24 | Discharge: 2017-03-24 | Disposition: A | Discharge status: Home / Self Care | Payer: Medicare HMO | Source: Ambulatory Visit | Attending: Pulmonary Disease | Admitting: Pulmonary Disease

## 2017-03-24 DIAGNOSIS — I5022 Chronic systolic (congestive) heart failure: Secondary | ICD-10-CM | POA: Insufficient documentation

## 2017-03-24 DIAGNOSIS — R42 Dizziness and giddiness: Secondary | ICD-10-CM | POA: Diagnosis not present

## 2017-03-24 DIAGNOSIS — E119 Type 2 diabetes mellitus without complications: Secondary | ICD-10-CM | POA: Diagnosis not present

## 2017-03-24 DIAGNOSIS — I499 Cardiac arrhythmia, unspecified: Secondary | ICD-10-CM | POA: Insufficient documentation

## 2017-03-24 LAB — BASIC METABOLIC PANEL
ANION GAP: 8 (ref 5–15)
BUN: 21 mg/dL — ABNORMAL HIGH (ref 6–20)
CHLORIDE: 100 mmol/L — AB (ref 101–111)
CO2: 30 mmol/L (ref 22–32)
CREATININE: 0.73 mg/dL (ref 0.44–1.00)
Calcium: 8.9 mg/dL (ref 8.9–10.3)
GFR calc non Af Amer: >60 mL/min (ref >60)
Glucose, Bld: 174 mg/dL — ABNORMAL HIGH (ref 65–99)
POTASSIUM: 4.8 mmol/L (ref 3.5–5.1)
SODIUM: 138 mmol/L (ref 135–145)

## 2017-03-24 LAB — HEMOGLOBIN A1C
HEMOGLOBIN A1C: 6.1 % — AB (ref 4.8–5.6)
Mean Plasma Glucose: 128.37 mg/dL

## 2017-03-24 LAB — BRAIN NATRIURETIC PEPTIDE: B Natriuretic Peptide: 1699 pg/mL — ABNORMAL HIGH (ref 0.0–100.0)

## 2017-03-27 ENCOUNTER — Encounter: Payer: Self-pay | Admitting: Physician Assistant

## 2017-03-27 NOTE — Progress Notes (Addendum)
Cardiology Office Note    Date:  03/28/2017  ID:  Juli, Odom 05/13/1941, MRN [id number] PCP:  Sinda Du, MD  Cardiologist:  Dr. Neta Mends - Dr. Lovena Le  Chief Complaint: swelling  History of Present Illness:  Donna [name] is a 76 y.o. female with history of decades-long history of chronic systolic CHF (NICM with reported normal cors 1989, 1999), chronic LBBB, HTN, HLD, DM, and chronic amiodarone use for ?VT, mitral regurgitation, prior small pericardial effusion, possible stroke 09/2016 who presents for evaluation of swelling.  In her chart PMH it lists "atrial flutter versus ventricular tachycardia treated with amiodarone" - details of this are unclear. Notes from back to 2006 indicate she has been maintained on amiodarone. She went on to have BiV-ICD implantation 11/2014 with [employer] device. In 09/2016 she was admitted with altered mental status. She has been having some trouble with what may be developing dementia over the last several months, however, came in at that time with acute symptoms. Imaging was negative for stroke but neurology recommended increasing ASA to 325mg  daily. Despite this event she's been fairly functional from cognitive standpoint. She ambulates with a cane. Echo 09/2016 showed persistent LV dysfunction with EF 15-20%, diffuse HK, mild-mod MR, severely dilated LA, mildly reduced RV function, mildly dilated RA. Prior carotid duplex <50% bilaterally. Last device note from 03/23/17 indicates 98% BiV pacing with "Thoracic impedance normal but was abnormal suggesting fluid accumulation for 12 days in last month." Patient was contacted and asked to f/u for any worsening symptoms. Last labs 03/2017 showed K 4.8, Cr 0.73, bnp 1699 (on 10/19 by PCP); Hgb 13.3 and plt 134 in 02/2017. LFTs ok 12/2016, last TSH 02/2016 4.8.  She returns for follow-up with daughter. She's generally felt poorly for the last few weeks but isn't really able to quantify why, "just crummy." She  also has noticed increasing edema for the past 1-2 weeks. No chest pain. She also feels somewhat short of breath with exertion; it's not clear that this has changed much. Weight is similar compared to prior value. She's not been a fan of compression hose but says she's now willing to wear if she has to. Recheck BP by me 102/58.   Past Medical History:  Diagnosis Date  . AICD (automatic cardioverter/defibrillator) present   . Arthritis    "fingers" (11/18/2014)  . Atrial flutter (HCC)    Versus ventricular tachycardia; treated with amiodarone  . Cardiomyopathy, nonischemic ([place])    Nl cors in [place]; CHF in 12/97; EF of 40% in 5/01 and 7/03, 25% in 9/04 and 4/08.  systolic murmur without valvular abnormalities by echo; refused automatic implantable cardiac defibrillator  . Cerebrovascular disease    Duplex study in 12/2006 shows atherosclerosis without focal stenosis  . Chronic systolic CHF (congestive heart failure) ([place])   . Diabetes mellitus, type II ([place])    No insulin  . Gastroesophageal reflux disease   . Hyperlipidemia   . Hypertension   . Left bundle branch block   . Mitral regurgitation   . Pericardial effusion    a. small by echo 2016, not seen in 2018.  . Stroke (cerebrum) ([place]) 09/2016  . Ventricular tachycardia ([place])    a. PMH it lists "atrial flutter versus ventricular tachycardia treated with amiodarone" - details of this are unclear. Notes from back to 2006 indicate she has been maintained on amiodarone but do not describe further indication. Patient's sister reports pt was told she had skipped beats  that could cause her to drop dead - Dr. Lattie Haw put her on amiodarone and recommended a defibrillator.    Past Surgical History:  Procedure Laterality Date  . ABDOMINAL HYSTERECTOMY  2006  . BI-VENTRICULAR IMPLANTABLE CARDIOVERTER DEFIBRILLATOR  (CRT-D)  11/18/2014  . COLONOSCOPY  2008  . EP IMPLANTABLE DEVICE N/A 11/18/2014   Procedure: BiV ICD Insertion CRT-D;   Surgeon: Evans Lance, MD;  Location: [facility];  Service: Cardiovascular;  Laterality: N/A;  . HERNIA REPAIR Right   . TONSILLECTOMY      Current Medications: Current Meds  Medication Sig  . acetaminophen (TYLENOL) 500 MG tablet Take 500 mg by mouth every 6 (six) hours as needed.  Marland Kitchen amiodarone (PACERONE) 200 MG tablet Take 100 mg by mouth daily at 12 noon.  Marland Kitchen aspirin EC 325 MG EC tablet Take 1 tablet (325 mg total) by mouth daily.  Marland Kitchen atorvastatin (LIPITOR) 20 MG tablet Take 1 tablet (20 mg total) by mouth daily at 6 PM.  . Calcium Carb-Cholecalciferol (CALCIUM 600 + D) 600-200 MG-UNIT TABS Take 1 tablet by mouth daily.  . carvedilol (COREG) 6.25 MG tablet Take 1 tablet (6.25 mg total) by mouth 2 (two) times daily.  . insulin detemir (LEVEMIR) 100 UNIT/ML injection Inject 12 Units into the skin at bedtime.  . IRON PO Take 1 tablet by mouth daily.  Marland Kitchen levothyroxine (SYNTHROID, LEVOTHROID) 75 MCG tablet Take 75 mcg by mouth daily before breakfast.  . loratadine (CLARITIN) 10 MG tablet Take 10 mg by mouth daily as needed for allergies.  Marland Kitchen losartan (COZAAR) 25 MG tablet Take 0.5 tablets (12.5 mg total) by mouth daily.  . Omega-3 Fatty Acids (FISH OIL PO) Take by mouth 3 (three) times daily.  . potassium chloride SA (KLOR-CON M20) 20 MEQ tablet Take 2 Tablets In The Morning And Take 1 Tablet In The Evening  . sitaGLIPtin (JANUVIA) 100 MG tablet Take 100 mg by mouth daily.  Marland Kitchen torsemide (DEMADEX) 20 MG tablet Take 20 mg by mouth daily. Takes 2 tabs (40 mg) daily  . UNIFINE PENTIPS 32G X 4 MM MISC USE TWICE A DAY TO TAKE INSULIN.  . [DISCONTINUED] torsemide (DEMADEX) 20 MG tablet Take 1.5 tablets (30 mg total) by mouth daily. (Patient taking differently: Take 40 mg by mouth daily. )     Allergies:   Codeine and Decongestant [pseudoephedrine hcl er]   Social History   Social History  . Marital status: Widowed    Spouse name: N/A  . Number of children: 4  . Years of education: 10     Occupational History  . Retired Retired   Social History Main Topics  . Smoking status: Never Smoker  . Smokeless tobacco: Never Used  . Alcohol use No  . Drug use: No  . Sexual activity: No   Other Topics Concern  . None   Social History Narrative   Lives in [place] with her grandson   Physically active   Right-handed   Caffeine: none     Family History:  Family History  Problem Relation Age of Onset  . Diabetes Mother   . Kidney disease Mother   . Heart failure Mother   . Stomach cancer Father   . Hypertension Father   . Diabetes Sister   . Seizures Sister   . Heart attack Brother   . Seizures Brother   . Dementia Neg Hx     ROS:   Please see the history of present illness.  All other systems are reviewed and otherwise negative.    PHYSICAL EXAM:   VS:  BP (!) 96/58   Pulse 60   Ht 5\' 1"  (1.549 m)   Wt 112 lb (50.8 kg)   SpO2 99%   BMI 21.16 kg/m   BMI: Body mass index is 21.16 kg/m. GEN: Well nourished, well developed frail elderly WF, makeup done well, in no acute distress  HEENT: normocephalic, atraumatic Neck: no JVD, carotid bruits, or masses Cardiac: RRR; no murmurs, rubs, or gallops, 1-2+ bilateral pale LE edema with chronic appearing skin thickening, no weeping Respiratory:  clear to auscultation bilaterally, normal work of breathing GI: soft, nontender, nondistended, + BS MS: no deformity or atrophy  Skin: warm and dry, no rash Neuro:  Alert and Oriented x 3, Strength and sensation are intact, follows commands Psych: euthymic mood, full affect  Wt Readings from Last 3 Encounters:  03/28/17 112 lb (50.8 kg)  01/30/17 113 lb (51.3 kg)  12/04/16 100 lb (45.4 kg)      Studies/Labs Reviewed:   EKG:  EKG was ordered today and personally reviewed by me and demonstrates AV pacing 60bpm  Recent Labs: 09/23/2016: Magnesium 2.0 12/04/2016: ALT 32 03/03/2017: Hemoglobin 13.3; Platelets 134 03/24/2017: B Natriuretic Peptide 1,699.0; BUN 21;  Creatinine, Ser 0.73; Potassium 4.8; Sodium 138   Lipid Panel    Component Value Date/Time   CHOL 99 09/21/2016 0513   CHOL 113 02/24/2016 0923   TRIG 100 09/21/2016 0513   HDL 33 (L) 09/21/2016 0513   HDL 36 (L) 02/24/2016 0923   CHOLHDL 3.0 09/21/2016 0513   VLDL 20 09/21/2016 0513   LDLCALC 46 09/21/2016 0513   LDLCALC 60 02/24/2016 0923    Additional studies/ records that were reviewed today include: Summarized above    ASSESSMENT & PLAN:   1. Acute on chronic systolic CHF - I discussed the case with Dr. Domenic Polite as I am concerned about low output in Ms. Eguia. She reports generalized malaise and worsening edema in the setting of persistent softer blood pressure. We discussed inpatient versus outpatient management. Patient states she prefers to be at home as much as possible and so she and her daughter are hopeful for initial trial of outpatient med adjustment. Will stop carvedilol due to concern for acute decompensation and increase torsemide from 40mg  daily to 40mg  QAM/20mg  every afternoon. Continue low dose ARB as tolerated. Reviewed 2g sodium restriction, 2L fluid restriction, daily weights with patient. Also discussed possibility of using ACE wraps instead for compression and elevation of legs. She has been eating hamburgers recently. Will see her back in about 48 hours. I told them if she has no improvement whatsoever before then, they should proceed to [facility] where she can be seen in consultation by the advanced heart failure team for consideration of IV inotropes. I did broach the issue of goals of care with her as I am concerned about her overall prognosis and decline this year. This is my first time seeing her since 2016 but her daughter says they've spoken of these issues. For now she would like to continue present course (including keeping defibrillator therapies on) but do understand that she may be reaching a point where her heart failure is end-stage without  proceeding to more advanced therapies. We briefly touched on role of palliative/hospice but it doesn't sound like she's quite there yet without trying to manage current state. 2. NICM - s/p BiV ICD without significant improvement in LVEF. See above  regarding med adjustment. Stopping carvedilol for now. 3. Essential HTN - she reports historical softer blood pressures. Will need to follow closely. 4. Mitral regurgitation - prior echo 09/2016 with mild-moderate MR. Follow clinically. Her comorbidities would likely preclude any substantial valve surgery if there was progression.  Disposition: F/u with me in 48 hours.   Medication Adjustments/Labs and Tests Ordered: Current medicines are reviewed at length with the patient today.  Concerns regarding medicines are outlined above. Medication changes, Labs and Tests ordered today are summarized above and listed in the Patient Instructions accessible in Encounters.   Signed, Charlie Pitter, PA-C  03/28/2017 3:03 PM    [facility] Location in [place], [place] [address] Ph: [phone]; Fax [phone]

## 2017-03-28 ENCOUNTER — Ambulatory Visit (INDEPENDENT_AMBULATORY_CARE_PROVIDER_SITE_OTHER): Payer: Medicare HMO | Admitting: Physician Assistant

## 2017-03-28 ENCOUNTER — Encounter: Payer: Self-pay | Admitting: Physician Assistant

## 2017-03-28 VITALS — BP 102/58 | HR 60 | Ht 61.0 in | Wt 112.0 lb

## 2017-03-28 DIAGNOSIS — I5023 Acute on chronic systolic (congestive) heart failure: Secondary | ICD-10-CM

## 2017-03-28 DIAGNOSIS — I10 Essential (primary) hypertension: Secondary | ICD-10-CM

## 2017-03-28 DIAGNOSIS — I428 Other cardiomyopathies: Secondary | ICD-10-CM

## 2017-03-28 DIAGNOSIS — I340 Nonrheumatic mitral (valve) insufficiency: Secondary | ICD-10-CM

## 2017-03-28 MED ORDER — TORSEMIDE 20 MG PO TABS: ORAL_TABLET | ORAL | 6 refills | Status: AC

## 2017-03-28 NOTE — Patient Instructions (Signed)
Medication Instructions:  Your physician has recommended you make the following change in your medication:  Stop Taking Coreg  Increase Toresemide to 40 mg in the AM and 20 mg in the Evening    Labwork: NONE   Testing/Procedures: NONE   Follow-Up: Your physician recommends that you schedule a follow-up appointment in: 48 hours    Any Other Special Instructions Will Be Listed Below (If Applicable).     If you need a refill on your cardiac medications before your next appointment, please call your pharmacy. Thank you for choosing [facility]!

## 2017-03-30 ENCOUNTER — Ambulatory Visit (INDEPENDENT_AMBULATORY_CARE_PROVIDER_SITE_OTHER): Payer: Medicare HMO | Admitting: Physician Assistant

## 2017-03-30 ENCOUNTER — Encounter: Payer: Self-pay | Admitting: Physician Assistant

## 2017-03-30 ENCOUNTER — Other Ambulatory Visit (HOSPITAL_COMMUNITY)
Admission: RE | Admit: 2017-03-30 | Discharge: 2017-03-30 | Disposition: A | Discharge status: Home / Self Care | Payer: Medicare HMO | Source: Ambulatory Visit | Attending: Physician Assistant | Admitting: Physician Assistant

## 2017-03-30 ENCOUNTER — Encounter: Payer: Self-pay | Admitting: Cardiology

## 2017-03-30 VITALS — BP 104/64 | HR 61 | Ht 61.0 in | Wt 108.0 lb

## 2017-03-30 DIAGNOSIS — I5023 Acute on chronic systolic (congestive) heart failure: Secondary | ICD-10-CM | POA: Insufficient documentation

## 2017-03-30 DIAGNOSIS — I428 Other cardiomyopathies: Secondary | ICD-10-CM

## 2017-03-30 DIAGNOSIS — I110 Hypertensive heart disease with heart failure: Secondary | ICD-10-CM | POA: Insufficient documentation

## 2017-03-30 DIAGNOSIS — I340 Nonrheumatic mitral (valve) insufficiency: Secondary | ICD-10-CM | POA: Diagnosis not present

## 2017-03-30 DIAGNOSIS — Z9229 Personal history of other drug therapy: Secondary | ICD-10-CM

## 2017-03-30 DIAGNOSIS — I959 Hypotension, unspecified: Secondary | ICD-10-CM

## 2017-03-30 DIAGNOSIS — I10 Essential (primary) hypertension: Secondary | ICD-10-CM

## 2017-03-30 LAB — CBC WITH DIFFERENTIAL/PLATELET
BASOS ABS: 0 10*3/uL (ref 0.0–0.1)
BASOS PCT: 0 %
Eosinophils Absolute: 0 10*3/uL (ref 0.0–0.7)
Eosinophils Relative: 1 %
HEMATOCRIT: 40.7 % (ref 36.0–46.0)
HEMOGLOBIN: 13 g/dL (ref 12.0–15.0)
LYMPHS PCT: 30 %
Lymphs Abs: 2.4 10*3/uL (ref 0.7–4.0)
MCH: 31.4 pg (ref 26.0–34.0)
MCHC: 31.9 g/dL (ref 30.0–36.0)
MCV: 98.3 fL (ref 78.0–100.0)
Monocytes Absolute: 0.6 10*3/uL (ref 0.1–1.0)
Monocytes Relative: 8 %
NEUTROS ABS: 4.8 10*3/uL (ref 1.7–7.7)
NEUTROS PCT: 61 %
Platelets: 138 10*3/uL — ABNORMAL LOW (ref 150–400)
RBC: 4.14 MIL/uL (ref 3.87–5.11)
RDW: 16.7 % — AB (ref 11.5–15.5)
WBC: 7.8 10*3/uL (ref 4.0–10.5)

## 2017-03-30 LAB — BASIC METABOLIC PANEL
ANION GAP: 9 (ref 5–15)
BUN: 14 mg/dL (ref 6–20)
CALCIUM: 8.7 mg/dL — AB (ref 8.9–10.3)
CHLORIDE: 97 mmol/L — AB (ref 101–111)
CO2: 33 mmol/L — ABNORMAL HIGH (ref 22–32)
CREATININE: 0.74 mg/dL (ref 0.44–1.00)
GFR calc non Af Amer: >60 mL/min (ref >60)
Glucose, Bld: 125 mg/dL — ABNORMAL HIGH (ref 65–99)
Potassium: 3.1 mmol/L — ABNORMAL LOW (ref 3.5–5.1)
SODIUM: 139 mmol/L (ref 135–145)

## 2017-03-30 LAB — TSH: TSH: 18.222 u[IU]/mL — ABNORMAL HIGH (ref 0.350–4.500)

## 2017-03-30 LAB — T4, FREE: FREE T4: 1.76 ng/dL — AB (ref 0.61–1.12)

## 2017-03-30 NOTE — Patient Instructions (Signed)
Medication Instructions:  Your physician recommends that you continue on your current medications as directed. Please refer to the Current Medication list given to you today.   Labwork: Your physician recommends that you return for lab work in: Today    Testing/Procedures: NONE   Follow-Up: Your physician recommends that you schedule a follow-up appointment in: 1 Week    Any Other Special Instructions Will Be Listed Below (If Applicable).     If you need a refill on your cardiac medications before your next appointment, please call your pharmacy.  Thank you for choosing [facility]!

## 2017-03-30 NOTE — Progress Notes (Signed)
Cardiology Office Note    Date:  03/30/2017  ID:  Donna [name], Donna [name] 1941-01-28, MRN [id number] PCP:  Sinda Du, MD  Cardiologist:  Dr. Harl Bowie  Chief Complaint: f/u CHF  History of Present Illness:  Donna [name] is a 76 y.o. female with history of decades-long history of chronic systolic CHF (NICM with reported normal cors 1989, 1999), EF last assessed at 15-20% in 09/2016, chronic LBBB, HTN, HLD, DM, mild carotid disease, and chronic amiodarone use for ?VT, mitral regurgitation, prior small pericardial effusion, possible stroke 09/2016 who presents for evaluation of swelling.  In her chart PMH it lists "atrial flutter versus ventricular tachycardia treated with amiodarone" - details of this are unclear but from prior conversations with the patient, she reported she was told the heartbeats could cause her to drop dead so it has been presumed VT. She's been on amiodarone at least back to 2006. She went on to have BiV-ICD implantation 11/2014 with [employer] device. In 09/2016 she was admitted with altered mental status. She has been having some trouble with what may be developing dementia over the last several months, however, came in at that time with acute symptoms. Imaging was negative for stroke but neurology recommended increasing ASA to 325mg  daily. Despite this event, she's been fairly functional. She ambulates with a cane. Her daughter assists with her medications. Echo 09/2016 showed persistent LV dysfunction with EF 15-20%, diffuse HK, mild-mod MR, severely dilated LA, mildly reduced RV function, mildly dilated RA. Last device note from 03/23/17 indicated 98% BiV pacing with "Thoracic impedance normal but was abnormal suggesting fluid accumulation for 12 days in last month." Patient was contacted and asked to f/u for any worsening symptoms. Last labs 03/2017 showed K 4.8, Cr 0.73, bnp 1699 (on 10/19 by PCP); Hgb 13.3 and plt 134 in 02/2017. LFTs ok 12/2016, last TSH 02/2016 4.8. I saw her  in clinic 03/28/17 at which time she reported generalized malaise without specific complaint as well as progressive bilateral lower extremity edema for 1-2 weeks. She had been eating hamburgers more recently. Her blood pressure was soft but chronically so. I had discussed the case with Dr. Domenic Polite (DOD). Offered the patient inpatient admission but ultimately we elected a trial of stopping carvedilol due to concern for low output and titrating Demadex with low threshold to head to [facility] if no improvement. We also discussed ACE wraps for compression and reduction in sodium intake. We also had discussion regarding goals of care given concern for progressive CHF symptoms in the face of hypotension and limited options. I asked her to have some discussions with her family about this.  She returns for follow-up with her daughter. Much to all of our delight, her swelling has improved significantly. She still has mild edema at the ankles but her swelling has gone down quite a bit. She also reports feeling like she has a little more energy, less dizzy. No CP. No acute dyspnea. Moves at a slow pace. Weight down 4lb.   Past Medical History:  Diagnosis Date  . AICD (automatic cardioverter/defibrillator) present   . Arthritis    "fingers" (11/18/2014)  . Atrial flutter (HCC)    Versus ventricular tachycardia; treated with amiodarone  . Cardiomyopathy, nonischemic ([place])    Nl cors in [place]; CHF in 12/97; EF of 40% in 5/01 and 7/03, 25% in 9/04 and 4/08.  systolic murmur without valvular abnormalities by echo; refused automatic implantable cardiac defibrillator  . Cerebrovascular disease  Duplex study in 12/2006 shows atherosclerosis without focal stenosis  . Chronic systolic CHF (congestive heart failure) ([place])   . Diabetes mellitus, type II ([place])    No insulin  . Gastroesophageal reflux disease   . Hyperlipidemia   . Hypertension   . Left bundle branch block   . Mitral regurgitation   . Pericardial  effusion    a. small by echo 2016, not seen in 2018.  . Stroke (cerebrum) ([place]) 09/2016  . Ventricular tachycardia ([place])    a. PMH it lists "atrial flutter versus ventricular tachycardia treated with amiodarone" - details of this are unclear. Notes from back to 2006 indicate she has been maintained on amiodarone but do not describe further indication. Patient's sister reports pt was told she had skipped beats that could cause her to drop dead - Dr. Lattie Haw put her on amiodarone and recommended a defibrillator.    Past Surgical History:  Procedure Laterality Date  . ABDOMINAL HYSTERECTOMY  2006  . BI-VENTRICULAR IMPLANTABLE CARDIOVERTER DEFIBRILLATOR  (CRT-D)  11/18/2014  . COLONOSCOPY  2008  . EP IMPLANTABLE DEVICE N/A 11/18/2014   Procedure: BiV ICD Insertion CRT-D;  Surgeon: Evans Lance, MD;  Location: [facility];  Service: Cardiovascular;  Laterality: N/A;  . HERNIA REPAIR Right   . TONSILLECTOMY      Current Medications: Current Meds  Medication Sig  . acetaminophen (TYLENOL) 500 MG tablet Take 500 mg by mouth every 6 (six) hours as needed.  Marland Kitchen amiodarone (PACERONE) 200 MG tablet Take 100 mg by mouth daily at 12 noon.  Marland Kitchen aspirin EC 325 MG EC tablet Take 1 tablet (325 mg total) by mouth daily.  Marland Kitchen atorvastatin (LIPITOR) 20 MG tablet Take 1 tablet (20 mg total) by mouth daily at 6 PM.  . Calcium Carb-Cholecalciferol (CALCIUM 600 + D) 600-200 MG-UNIT TABS Take 1 tablet by mouth daily.  . insulin detemir (LEVEMIR) 100 UNIT/ML injection Inject 12 Units into the skin at bedtime.  . IRON PO Take 1 tablet by mouth daily.  Marland Kitchen levothyroxine (SYNTHROID, LEVOTHROID) 75 MCG tablet Take 75 mcg by mouth daily before breakfast.  . loratadine (CLARITIN) 10 MG tablet Take 10 mg by mouth daily as needed for allergies.  Marland Kitchen losartan (COZAAR) 25 MG tablet Take 0.5 tablets (12.5 mg total) by mouth daily.  . Omega-3 Fatty Acids (FISH OIL PO) Take by mouth 3 (three) times daily.  . potassium  chloride SA (KLOR-CON M20) 20 MEQ tablet Take 2 Tablets In The Morning And Take 1 Tablet In The Evening  . sitaGLIPtin (JANUVIA) 100 MG tablet Take 100 mg by mouth daily.  Marland Kitchen torsemide (DEMADEX) 20 MG tablet Take 2 Tablets (40 mg) In the AM And and Take 1 Tablet ( 20 mg) In the Evening.  Marland Kitchen UNIFINE PENTIPS 32G X 4 MM MISC USE TWICE A DAY TO TAKE INSULIN.     Allergies:   Codeine and Decongestant [pseudoephedrine hcl er]   Social History   Social History  . Marital status: Widowed    Spouse name: N/A  . Number of children: 4  . Years of education: 10   Occupational History  . Retired Retired   Social History Main Topics  . Smoking status: Never Smoker  . Smokeless tobacco: Never Used  . Alcohol use No  . Drug use: No  . Sexual activity: No   Other Topics Concern  . None   Social History Narrative   Lives in [place] with her grandson  Physically active   Right-handed   Caffeine: none     Family History:  Family History  Problem Relation Age of Onset  . Diabetes Mother   . Kidney disease Mother   . Heart failure Mother   . Stomach cancer Father   . Hypertension Father   . Diabetes Sister   . Seizures Sister   . Heart attack Brother   . Seizures Brother   . Dementia Neg Hx     ROS:   Please see the history of present illness. All other systems are reviewed and otherwise negative.    PHYSICAL EXAM:   VS:  BP 104/64 (BP Location: Right Arm)   Pulse 61   Ht 5\' 1"  (1.549 m)   Wt 108 lb (49 kg)   SpO2 96%   BMI 20.41 kg/m   BMI: Body mass index is 20.41 kg/m. GEN: Well nourished, well developed frail elderly WF, in no acute distress  HEENT: normocephalic, atraumatic Neck: no JVD, carotid bruits, or masses Cardiac: RRR; no murmurs, rubs, or gallops, softer mild LE edema with ACE wraps bilaterally Respiratory:  clear to auscultation bilaterally, normal work of breathing GI: soft, nontender, nondistended, + BS MS: no deformity or atrophy  Skin: warm and  dry, no rash Neuro:  Alert and Oriented x 3, Strength and sensation are intact, follows commands Psych: euthymic mood, full affect  Wt Readings from Last 3 Encounters:  03/30/17 108 lb (49 kg)  03/28/17 112 lb (50.8 kg)  01/30/17 113 lb (51.3 kg)      Studies/Labs Reviewed:   EKG:   EKG was not ordered today  Recent Labs: 09/23/2016: Magnesium 2.0 12/04/2016: ALT 32 03/03/2017: Hemoglobin 13.3; Platelets 134 03/24/2017: B Natriuretic Peptide 1,699.0; BUN 21; Creatinine, Ser 0.73; Potassium 4.8; Sodium 138   Lipid Panel    Component Value Date/Time   CHOL 99 09/21/2016 0513   CHOL 113 02/24/2016 0923   TRIG 100 09/21/2016 0513   HDL 33 (L) 09/21/2016 0513   HDL 36 (L) 02/24/2016 0923   CHOLHDL 3.0 09/21/2016 0513   VLDL 20 09/21/2016 0513   LDLCALC 46 09/21/2016 0513   LDLCALC 60 02/24/2016 0923    Additional studies/ records that were reviewed today include: Summarized above.    ASSESSMENT & PLAN:   1. Acute on chronic systolic CHF/NICM - due to suspected low-output (low EF, complaints of general malaise), I stopped her carvedilol and increased torsemide 2 days. She feels quite a bit better off the carvedilol and diuresed well. I consulted with Dr. Haroldine Laws with the advanced heart failure team over the phone given recent course. He agrees with concern for this representing end-stage disease. He recommends to remain off the beta blocker indefinitely, as we have likely reached a point where the long-term gain of such a medicine is not really of benefit. Will continue torsemide and check labs to guide whether we are OK to continue. She's been steadily losing weight (for example, 120lb last year) so her dry weight is a moving target. It may be that we will need to hold the afternoon dose if weight goes below 107, for example. Reinforced need to abstain from high sodium foods and keep goal at 2000mg  a day or less. I also revisited the idea of goals of care. She has given it more  thought. She knows for sure she does not wish to be intubated. We discussed that unfortunately, often times CPR has to be accompanied by ventilation in order to be successful,  and that with her comorbidities, the likelihood for meaningful recovery from such continues to be limited. She will continue discussions with family and keep [place] apprised of any updates. She would like her defibrillator to remain active which is reasonable. This will likely need to be an ongoing discussion as her disease progresses.  2. Essential HTN with more recent hypotension - she reports historically low blood pressure. This is stable today. Given that we'll be getting labs today, will go ahead and update CBC to make sure no recent change given her recent dizziness/malaise. 3. Amiodarone therapy - prior abnormal TSH noted. Will check TSH with free T4 in addition to labs above. 4. Mitral regurgitation - previous echo 09/2016 with mild-mod MR. Her comorbidities including low EF would likely preclude any substantial valve surgery even if there was progression.  Disposition: F/u with Dr. Christella Noa in 1 week.  Medication Adjustments/Labs and Tests Ordered: Current medicines are reviewed at length with the patient today.  Concerns regarding medicines are outlined above. Medication changes, Labs and Tests ordered today are summarized above and listed in the Patient Instructions accessible in Encounters.   Signed, Charlie Pitter, PA-C  03/30/2017 3:49 PM    [facility] Location in [place], [place] [address] Ph: [phone]; Fax [phone]

## 2017-03-31 ENCOUNTER — Telehealth: Payer: Self-pay

## 2017-03-31 DIAGNOSIS — R946 Abnormal results of thyroid function studies: Secondary | ICD-10-CM

## 2017-03-31 DIAGNOSIS — E876 Hypokalemia: Secondary | ICD-10-CM

## 2017-03-31 MED ORDER — POTASSIUM CHLORIDE CRYS ER 20 MEQ PO TBCR: EXTENDED_RELEASE_TABLET | ORAL | 6 refills | Status: DC

## 2017-03-31 NOTE — Telephone Encounter (Signed)
-----   Message from Charlie Pitter, [place] sent at 03/31/2017  7:46 AM EDT ----- Please let patient know labs are stable excapt a few abnormalities: - low potassium. Make sure that patient was taking potassium as prescribed. If so, increase to KCl 2 tablets BID. Since recent potassium otherwise was actually borderline high, she will need recheck BMET at next office visit. - Continue present dose of torsemide (40mg  QAM/20mg  QPM). Can hold afternoon dose if weight falls to less than 107. Call if she is finding she is consistently holding the evening dose so that we can adjust potassium. - Needs referral to endocrinology for abnormal thyroid function while on amiodarone. TSH is high suggesting underactive thyroid but free T4 is elevated suggesting overactivity. Dayna Dunn PA-C

## 2017-03-31 NOTE — Telephone Encounter (Addendum)
I spoke with daughter and told her we placed a referral to endocrinology. Also she will increase her mothers potassium to 40 meq twice a day and she will call if her weight drops below 107 lbs. She will repeat BMET before f/u next week. She will stay on current dose of torsemide but if she consistently holds pm torsemide to let us know

## 2017-03-31 NOTE — Addendum Note (Signed)
Addended by: Barbarann Ehlers A on: 03/31/2017 09:39 AM   Modules accepted: Orders

## 2017-03-31 NOTE — Telephone Encounter (Signed)
LM for daughter to call back-cc

## 2017-04-04 ENCOUNTER — Other Ambulatory Visit (HOSPITAL_COMMUNITY)
Admission: RE | Admit: 2017-04-04 | Discharge: 2017-04-04 | Disposition: A | Discharge status: Home / Self Care | Payer: Medicare HMO | Source: Ambulatory Visit | Attending: Physician Assistant | Admitting: Physician Assistant

## 2017-04-04 DIAGNOSIS — I959 Hypotension, unspecified: Secondary | ICD-10-CM | POA: Diagnosis not present

## 2017-04-04 DIAGNOSIS — I428 Other cardiomyopathies: Secondary | ICD-10-CM | POA: Insufficient documentation

## 2017-04-04 DIAGNOSIS — I10 Essential (primary) hypertension: Secondary | ICD-10-CM | POA: Diagnosis not present

## 2017-04-04 DIAGNOSIS — Z9229 Personal history of other drug therapy: Secondary | ICD-10-CM | POA: Diagnosis not present

## 2017-04-04 DIAGNOSIS — I5023 Acute on chronic systolic (congestive) heart failure: Secondary | ICD-10-CM | POA: Insufficient documentation

## 2017-04-04 DIAGNOSIS — I340 Nonrheumatic mitral (valve) insufficiency: Secondary | ICD-10-CM | POA: Insufficient documentation

## 2017-04-04 LAB — BASIC METABOLIC PANEL
ANION GAP: 9 (ref 5–15)
BUN: 15 mg/dL (ref 6–20)
CALCIUM: 8.9 mg/dL (ref 8.9–10.3)
CO2: 29 mmol/L (ref 22–32)
Chloride: 102 mmol/L (ref 101–111)
Creatinine, Ser: 0.7 mg/dL (ref 0.44–1.00)
GFR calc Af Amer: >60 mL/min (ref >60)
GFR calc non Af Amer: >60 mL/min (ref >60)
GLUCOSE: 98 mg/dL (ref 65–99)
Potassium: 4.1 mmol/L (ref 3.5–5.1)
Sodium: 140 mmol/L (ref 135–145)

## 2017-04-04 LAB — CBC WITH DIFFERENTIAL/PLATELET
BASOS ABS: 0 10*3/uL (ref 0.0–0.1)
Basophils Relative: 0 %
Eosinophils Absolute: 0 10*3/uL (ref 0.0–0.7)
Eosinophils Relative: 1 %
HEMATOCRIT: 40.1 % (ref 36.0–46.0)
Hemoglobin: 12.7 g/dL (ref 12.0–15.0)
LYMPHS ABS: 1.5 10*3/uL (ref 0.7–4.0)
LYMPHS PCT: 21 %
MCH: 30.8 pg (ref 26.0–34.0)
MCHC: 31.7 g/dL (ref 30.0–36.0)
MCV: 97.3 fL (ref 78.0–100.0)
MONO ABS: 0.3 10*3/uL (ref 0.1–1.0)
MONOS PCT: 5 %
Neutro Abs: 5.3 10*3/uL (ref 1.7–7.7)
Neutrophils Relative %: 73 %
Platelets: 152 10*3/uL (ref 150–400)
RBC: 4.12 MIL/uL (ref 3.87–5.11)
RDW: 16.8 % — AB (ref 11.5–15.5)
WBC: 7.2 10*3/uL (ref 4.0–10.5)

## 2017-04-04 LAB — TSH: TSH: 13.162 u[IU]/mL — ABNORMAL HIGH (ref 0.350–4.500)

## 2017-04-04 LAB — T4, FREE: FREE T4: 1.46 ng/dL — AB (ref 0.61–1.12)

## 2017-04-05 ENCOUNTER — Other Ambulatory Visit: Payer: Self-pay | Admitting: Physician Assistant

## 2017-04-06 ENCOUNTER — Ambulatory Visit (INDEPENDENT_AMBULATORY_CARE_PROVIDER_SITE_OTHER): Payer: Medicare HMO | Admitting: Cardiology

## 2017-04-06 ENCOUNTER — Encounter: Payer: Self-pay | Admitting: Cardiology

## 2017-04-06 VITALS — BP 118/72 | HR 79 | Ht 61.0 in | Wt 107.0 lb

## 2017-04-06 DIAGNOSIS — I4729 Other ventricular tachycardia: Secondary | ICD-10-CM

## 2017-04-06 DIAGNOSIS — I428 Other cardiomyopathies: Secondary | ICD-10-CM | POA: Diagnosis not present

## 2017-04-06 DIAGNOSIS — I472 Ventricular tachycardia: Secondary | ICD-10-CM

## 2017-04-06 NOTE — Patient Instructions (Signed)
Medication Instructions:  Your physician recommends that you continue on your current medications as directed. Please refer to the Current Medication list given to you today.   Labwork: NONE   Testing/Procedures: NONE   Follow-Up: Your physician recommends that you schedule a follow-up appointment in: 6 Weeks   Any Other Special Instructions Will Be Listed Below (If Applicable).     If you need a refill on your cardiac medications before your next appointment, please call your pharmacy.  Thank you for choosing [facility]!

## 2017-04-06 NOTE — Progress Notes (Signed)
Clinical Summary Donna [name] is a 76 y.o.female seen today for follow up of the following medical problems.  1. NICM  - long history of cardiomyopathy, prior caths without obstructive disease  - LVEF 20% by echo 06/2011,  - echo 10/2014 LVEF 15-20%.  - 02/2016 echo LVEF 20-25%, grade II diastolic dysfunction - Donna [name] has BiV AICD followed by Dr Lovena Le.     - seen 03/30/17 by PA Dunn. Patient with low bp's, coreg was stopped.  - weights stable today around 107 lbs. Compression stockings helping. Home weights stable 107 lbs - compliant with meds.   - breathing is stable since last visit.   2. Ventricular tachycardia  - followed by EP. Donna [name] has BiV AICD.  - no recent palpitations   3. Abnormal thyroid - referred to endocrine, high TSH    - neuro has recommended ASA 325mg  daiily.  Past Medical History:  Diagnosis Date  . AICD (automatic cardioverter/defibrillator) present   . Arthritis    "fingers" (11/18/2014)  . Atrial flutter (HCC)    Versus ventricular tachycardia; treated with amiodarone  . Cardiomyopathy, nonischemic ([place])    Nl cors in [place]; CHF in 12/97; EF of 40% in 5/01 and 7/03, 25% in 9/04 and 4/08.  systolic murmur without valvular abnormalities by echo; refused automatic implantable cardiac defibrillator  . Carotid artery disease ([place])    a. duplex 09/2016 <50% stenosis.  . Chronic systolic CHF (congestive heart failure) ([place])   . Diabetes mellitus, type II ([place])    No insulin  . Gastroesophageal reflux disease   . Hyperlipidemia   . Hypertension   . Left bundle Zaven Klemens block   . Mitral regurgitation   . Pericardial effusion    a. small by echo 2016, not seen in 2018.  . Stroke (cerebrum) ([place]) 09/2016  . Ventricular tachycardia ([place])    a. PMH it lists "atrial flutter versus ventricular tachycardia treated with amiodarone" - details of this are unclear. Notes from back to 2006 indicate Donna [name] has been maintained on amiodarone but do not  describe further indication. Patient's sister reports pt was told Donna [name] had skipped beats that could cause Donna [name] to drop dead - Dr. Lattie Haw put Donna [name] on amiodarone and recommended a defibrillator.     Allergies  Allergen Reactions  . Codeine Other (See Comments)    Reaction:  Racing heart   . Decongestant [Pseudoephedrine Hcl Er] Other (See Comments)    Reaction:  Racing heart      Current Outpatient Prescriptions  Medication Sig Dispense Refill  . acetaminophen (TYLENOL) 500 MG tablet Take 500 mg by mouth every 6 (six) hours as needed.    Marland Kitchen amiodarone (PACERONE) 200 MG tablet Take 100 mg by mouth daily at 12 noon.    Marland Kitchen aspirin EC 325 MG EC tablet Take 1 tablet (325 mg total) by mouth daily. 30 tablet 0  . atorvastatin (LIPITOR) 20 MG tablet Take 1 tablet (20 mg total) by mouth daily at 6 PM. 30 tablet 12  . Calcium Carb-Cholecalciferol (CALCIUM 600 + D) 600-200 MG-UNIT TABS Take 1 tablet by mouth daily.    . insulin detemir (LEVEMIR) 100 UNIT/ML injection Inject 12 Units into the skin at bedtime.    . IRON PO Take 1 tablet by mouth daily.    Marland Kitchen levothyroxine (SYNTHROID, LEVOTHROID) 75 MCG tablet Take 75 mcg by mouth daily before breakfast.    . loratadine (CLARITIN) 10 MG tablet Take 10 mg by mouth daily as  needed for allergies.    Marland Kitchen losartan (COZAAR) 25 MG tablet Take 0.5 tablets (12.5 mg total) by mouth daily. 90 tablet 3  . losartan (COZAAR) 25 MG tablet TAKE ONE (1) TABLET BY MOUTH EVERY DAY 90 tablet 3  . Omega-3 Fatty Acids (FISH OIL PO) Take by mouth 3 (three) times daily.    . potassium chloride SA (KLOR-CON M20) 20 MEQ tablet Take 2 tablets in the am , and 2 tablets in the pm 120 tablet 6  . sitaGLIPtin (JANUVIA) 100 MG tablet Take 100 mg by mouth daily.    Marland Kitchen torsemide (DEMADEX) 20 MG tablet Take 2 Tablets (40 mg) In the AM And and Take 1 Tablet ( 20 mg) In the Evening. 90 tablet 6  . UNIFINE PENTIPS 32G X 4 MM MISC USE TWICE A DAY TO TAKE INSULIN. 100 each 2   No current  facility-administered medications for this visit.      Past Surgical History:  Procedure Laterality Date  . ABDOMINAL HYSTERECTOMY  2006  . BI-VENTRICULAR IMPLANTABLE CARDIOVERTER DEFIBRILLATOR  (CRT-D)  11/18/2014  . COLONOSCOPY  2008  . EP IMPLANTABLE DEVICE N/A 11/18/2014   Procedure: BiV ICD Insertion CRT-D;  Surgeon: Evans Lance, MD;  Location: [facility];  Service: Cardiovascular;  Laterality: N/A;  . HERNIA REPAIR Right   . TONSILLECTOMY       Allergies  Allergen Reactions  . Codeine Other (See Comments)    Reaction:  Racing heart   . Decongestant [Pseudoephedrine Hcl Er] Other (See Comments)    Reaction:  Racing heart       Family History  Problem Relation Age of Onset  . Diabetes Mother   . Kidney disease Mother   . Heart failure Mother   . Stomach cancer Father   . Hypertension Father   . Diabetes Sister   . Seizures Sister   . Heart attack Brother   . Seizures Brother   . Dementia Neg Hx      Social History Donna [name] reports that Donna [name] has never smoked. Donna [name] has never used smokeless tobacco. Donna [name] reports that Donna [name] does not drink alcohol.   Review of Systems CONSTITUTIONAL: No weight loss, fever, chills, weakness or fatigue.  HEENT: Eyes: No visual loss, blurred vision, double vision or yellow sclerae.No hearing loss, sneezing, congestion, runny nose or sore throat.  SKIN: No rash or itching.  CARDIOVASCULAR: per hpi RESPIRATORY: per hpi GASTROINTESTINAL: No anorexia, nausea, vomiting or diarrhea. No abdominal pain or blood.  GENITOURINARY: No burning on urination, no polyuria NEUROLOGICAL: No headache, dizziness, syncope, paralysis, ataxia, numbness or tingling in the extremities. No change in bowel or bladder control.  MUSCULOSKELETAL: No muscle, back pain, joint pain or stiffness.  LYMPHATICS: No enlarged nodes. No history of splenectomy.  PSYCHIATRIC: No history of depression or anxiety.  ENDOCRINOLOGIC: No reports of sweating,  cold or heat intolerance. No polyuria or polydipsia.  Marland Kitchen   Physical Examination Vitals:   04/06/17 1312  BP: 118/72  Pulse: 79  SpO2: 93%   Vitals:   04/06/17 1312  Weight: 107 lb (48.5 kg)  Height: 5\' 1"  (1.549 m)    Gen: resting comfortably, no acute distress HEENT: no scleral icterus, pupils equal round and reactive, no palptable cervical adenopathy,  CV: RRR, no m/r/g, no jvd Resp: Clear to auscultation bilaterally GI: abdomen is soft, non-tender, non-distended, normal bowel sounds, no hepatosplenomegaly MSK: extremities are warm, 1+ bilaterla edema Skin: warm, no rash Neuro:  no focal  deficits Psych: appropriate affect   Diagnostic Studies 06/2011 Echo LVEF 20%, mildly dilated, mild LVH, grade I diastolic dysfunction, mild to mod MR, PASP 36   10/04/13 Echo Study Conclusions  - Left ventricle: Left ventricular systolic function is severely reduced, EF 15-20%. Severe global hypokinesis to akinesis is seen. The cavity size was moderately to severely dilated. Wall thickness was increased in a pattern of mild LVH. Doppler parameters are consistent with abnormal left ventricular relaxation (grade 1 diastolic dysfunction). Doppler parameters are consistent with high ventricular filling pressure. - Regional wall motion abnormality: Akinesis of the mid anterior, mid anteroseptal, basal-mid inferoseptal, apical septal, and apical myocardium; severe hypokinesis of the mid inferior and apical lateral myocardium; moderate hypokinesis of the mid inferolateral and basal-mid anterolateral myocardium. - Aortic valve: Mildly calcified annulus. Mildly thickened leaflets. - Mitral valve: Mildly dilated annulus. Mildly thickened leaflets . Mild to moderate regurgitation. - Left atrium: The atrium was moderately dilated. - Tricuspid valve: Mild regurgitation. - Pulmonary arteries: PA peak pressure: 73mm Hg (S). Mildly elevated pulmonary pressures. - Systemic veins: IVC is  dilated, with normal respiratory variation. Estimated CVP 8 mmHg. - Pericardium, extracardiac: A trivial pericardial effusion was identified posterior to the heart.  10/2014 Echo Study Conclusions  - Left ventricle: There is global hypokinesis with akinesis of the anterior, inferior walls and paradoxical septal motion. Overall LVEF is severely refused estimated at 15-20%. The cavity size was mildly dilated. There was moderate concentric hypertrophy. Systolic function was severely reduced. The estimated ejection fraction was in the range of 15-20%. Features are consistent with a pseudonormal left ventricular filling pattern, with concomitant abnormal relaxation and increased filling pressure (grade 2 diastolic dysfunction). Doppler parameters are consistent with elevated ventricular end-diastolic filling pressure. - Ventricular septum: Septal motion showed paradox. - Aortic valve: Trileaflet; normal thickness leaflets. There was no regurgitation. - Aortic root: The aortic root was normal in size. - Mitral valve: Structurally normal valve. There was moderate regurgitation. - Left atrium: The atrium was normal in size. - Right ventricle: Systolic function was mildly reduced. - Right atrium: The atrium was normal in size. - Tricuspid valve: There was mild regurgitation. - Pulmonary arteries: Systolic pressure was within the normal range. PA peak pressure: 34 mm Hg (S). - Inferior vena cava: The vessel was normal in size. - Pericardium, extracardiac: A trivial pericardial effusion was identified.    Assessment and Plan  1. NICM/Chronic systolic HF  - home weights stable, we will continue current meds   2. Ventricular tachycardia  - asymptomatic, continue to monitor   F/u 6 weeks      Arnoldo Lenis, M.D.

## 2017-04-09 ENCOUNTER — Encounter: Payer: Self-pay | Admitting: Cardiology

## 2017-04-10 DIAGNOSIS — R69 Illness, unspecified: Secondary | ICD-10-CM | POA: Diagnosis not present

## 2017-04-13 LAB — CUP PACEART REMOTE DEVICE CHECK
Brady Statistic AP VS Percent: 1 %
Brady Statistic AS VS Percent: 1 %
HIGH POWER IMPEDANCE MEASURED VALUE: 44 Ohm
HighPow Impedance: 44 Ohm
Implantable Lead Implant Date: 20160614
Implantable Lead Implant Date: 20160614
Implantable Lead Location: 753858
Implantable Lead Location: 753859
Implantable Lead Location: 753860
Implantable Lead Model: 7122
Implantable Pulse Generator Implant Date: 20160614
Lead Channel Impedance Value: 400 Ohm
Lead Channel Pacing Threshold Amplitude: 0.5 V
Lead Channel Pacing Threshold Pulse Width: 0.5 ms
Lead Channel Sensing Intrinsic Amplitude: 11.7 mV
Lead Channel Setting Pacing Amplitude: 2.25 V
Lead Channel Setting Pacing Pulse Width: 0.5 ms
Lead Channel Setting Pacing Pulse Width: 0.5 ms
MDC IDC LEAD IMPLANT DT: 20160614
MDC IDC MSMT BATTERY REMAINING LONGEVITY: 55 mo
MDC IDC MSMT BATTERY REMAINING PERCENTAGE: 68 %
MDC IDC MSMT BATTERY VOLTAGE: 2.96 V
MDC IDC MSMT LEADCHNL LV IMPEDANCE VALUE: 760 Ohm
MDC IDC MSMT LEADCHNL LV PACING THRESHOLD AMPLITUDE: 1.25 V
MDC IDC MSMT LEADCHNL LV PACING THRESHOLD PULSEWIDTH: 0.5 ms
MDC IDC MSMT LEADCHNL RA SENSING INTR AMPL: 1 mV
MDC IDC MSMT LEADCHNL RV IMPEDANCE VALUE: 350 Ohm
MDC IDC MSMT LEADCHNL RV PACING THRESHOLD AMPLITUDE: 0.5 V
MDC IDC MSMT LEADCHNL RV PACING THRESHOLD PULSEWIDTH: 0.5 ms
MDC IDC SESS DTM: 20181018061234
MDC IDC SET LEADCHNL RA PACING AMPLITUDE: 2 V
MDC IDC SET LEADCHNL RV PACING AMPLITUDE: 2.5 V
MDC IDC SET LEADCHNL RV SENSING SENSITIVITY: 0.5 mV
MDC IDC STAT BRADY AP VP PERCENT: 83 %
MDC IDC STAT BRADY AS VP PERCENT: 16 %
MDC IDC STAT BRADY RA PERCENT PACED: 83 %
Pulse Gen Serial Number: 1210376

## 2017-04-24 ENCOUNTER — Ambulatory Visit (INDEPENDENT_AMBULATORY_CARE_PROVIDER_SITE_OTHER): Payer: Medicare HMO

## 2017-04-24 DIAGNOSIS — Z95810 Presence of automatic (implantable) cardiac defibrillator: Secondary | ICD-10-CM | POA: Diagnosis not present

## 2017-04-24 DIAGNOSIS — I5022 Chronic systolic (congestive) heart failure: Secondary | ICD-10-CM

## 2017-04-24 NOTE — Progress Notes (Signed)
EPIC Encounter for ICM Monitoring  Patient Name: Donna [name] is a 76 y.o. female Date: 04/24/2017 Primary Care Physican: Sinda Du, MD Primary Cardiologist: Wardell Heath PA Electrophysiologist: Lovena Le Dry Weight:unknown Bi-V Pacing: 98%      Attempted call to daughter Donna [name] [facility]) and unable to reach.  Transmission reviewed   Thoracic impedance normal.  Prescribed dosage: Torsemide 20 mg 1.5tablets (30 mg total)daily. Potassium 20 mEq 2 tablets in AM and 1 tablet in PM.   Labs: 03/03/2017 Creatinine 0.83, BUN 21, Potassium 4.3, Sodium 139, EGFR >60 12/04/2016 Creatinine 0.85, BUN 15, Potassium 3.8, Sodium 145, EGFR>60 09/24/2016 Creatinine 0.52, BUN 17, Potassium 4.2, Sodium 140, EGFR >60 09/23/2016 Creatinine 0.67, BUN 18, Potassium 2.5, Sodium 138, EGFR >60  09/20/2016 Creatinine 0.72, BUN 17, Potassium 4.0, Sodium 138, EGFR >60  08/31/2016 Creatinine 0.85, BUN 21, Potassium 4.1, Sodium 135, EGFR >60 06/08/2016 Creatinine 0.81, BUN 15, Potassium 4.6, Sodium 138, EGFR >60  Recommendations: NONE - Unable to reach.  Follow-up plan: ICM clinic phone appointment on 05/25/2017.    Copy of ICM check sent to Dr. Lovena Le.   3 month ICM trend: 04/24/2017    1 Year ICM trend:       Donna Billings, RN 04/24/2017 12:50 PM

## 2017-05-02 ENCOUNTER — Ambulatory Visit: Payer: Self-pay | Admitting: "Endocrinology

## 2017-05-18 NOTE — Progress Notes (Signed)
Cardiology Office Note   Date:  05/19/2017   ID:  Donna, [name] 06/22/1940, MRN [id number]  PCP:  Sinda Du, MD  Cardiologist:  Carlyle Dolly, MD Chief Complaint  Patient presents with  . Cardiomyopathy  . Hypertension     History of Present Illness: Donna [name] is a 76 y.o. female who presents for ongoing management of nonischemic cardiomyopathy, most recent cardiac catheterization revealed nonobstructive disease, most recent echocardiogram dated 09/22/2016 revealed LVEF of 15% to 20%;, grade 2 diastolic dysfunction, she has Bi V AICD in situ, also history of ventricular tachycardia, hypothyroidism.  Was last seen in the office by Dr. Harl Bowie on 04/06/2017.  Time she was stable, no evidence of fluid overload.  She is here for 6-week follow-up.  She comes today without significant complaints of dyspnea or chest pain.  She states that she sometimes feels "blah" but otherwise is at baseline.  She is very sedentary but her daughter makes her get up and walk once an hour throughout the house to get her moving.  She is medically compliant.  Her family make sure she takes her medications.  Past Medical History:  Diagnosis Date  . AICD (automatic cardioverter/defibrillator) present   . Arthritis    "fingers" (11/18/2014)  . Atrial flutter (HCC)    Versus ventricular tachycardia; treated with amiodarone  . Cardiomyopathy, nonischemic ([place])    Nl cors in [place]; CHF in 12/97; EF of 40% in 5/01 and 7/03, 25% in 9/04 and 4/08.  systolic murmur without valvular abnormalities by echo; refused automatic implantable cardiac defibrillator  . Carotid artery disease ([place])    a. duplex 09/2016 <50% stenosis.  . Chronic systolic CHF (congestive heart failure) ([place])   . Diabetes mellitus, type II ([place])    No insulin  . Gastroesophageal reflux disease   . Hyperlipidemia   . Hypertension   . Left bundle branch block   . Mitral regurgitation   . Pericardial effusion    a. small by  echo 2016, not seen in 2018.  . Stroke (cerebrum) ([place]) 09/2016  . Ventricular tachycardia ([place])    a. PMH it lists "atrial flutter versus ventricular tachycardia treated with amiodarone" - details of this are unclear. Notes from back to 2006 indicate she has been maintained on amiodarone but do not describe further indication. Patient's sister reports pt was told she had skipped beats that could cause her to drop dead - Dr. Lattie Haw put her on amiodarone and recommended a defibrillator.    Past Surgical History:  Procedure Laterality Date  . ABDOMINAL HYSTERECTOMY  2006  . BI-VENTRICULAR IMPLANTABLE CARDIOVERTER DEFIBRILLATOR  (CRT-D)  11/18/2014  . COLONOSCOPY  2008  . EP IMPLANTABLE DEVICE N/A 11/18/2014   Procedure: BiV ICD Insertion CRT-D;  Surgeon: Evans Lance, MD;  Location: [facility];  Service: Cardiovascular;  Laterality: N/A;  . HERNIA REPAIR Right   . TONSILLECTOMY       Current Outpatient Medications  Medication Sig Dispense Refill  . acetaminophen (TYLENOL) 500 MG tablet Take 500 mg by mouth every 6 (six) hours as needed.    Marland Kitchen amiodarone (PACERONE) 200 MG tablet Take 100 mg by mouth daily at 12 noon.    Marland Kitchen aspirin EC 325 MG EC tablet Take 1 tablet (325 mg total) by mouth daily. 30 tablet 0  . atorvastatin (LIPITOR) 20 MG tablet Take 1 tablet (20 mg total) by mouth daily at 6 PM. 30 tablet 12  . Calcium Carb-Cholecalciferol (CALCIUM  600 + D) 600-200 MG-UNIT TABS Take 1 tablet by mouth daily.    . insulin detemir (LEVEMIR) 100 UNIT/ML injection Inject 12 Units into the skin at bedtime.    . IRON PO Take 1 tablet by mouth daily.    Marland Kitchen levothyroxine (SYNTHROID, LEVOTHROID) 75 MCG tablet Take 75 mcg by mouth daily before breakfast.    . loratadine (CLARITIN) 10 MG tablet Take 10 mg by mouth daily as needed for allergies.    Marland Kitchen losartan (COZAAR) 25 MG tablet Take 0.5 tablets (12.5 mg total) by mouth daily. 90 tablet 3  . Omega-3 Fatty Acids (FISH OIL PO) Take by mouth 3  (three) times daily.    . potassium chloride SA (KLOR-CON M20) 20 MEQ tablet Take 2 tablets in the am , and 2 tablets in the pm 120 tablet 6  . sitaGLIPtin (JANUVIA) 100 MG tablet Take 100 mg by mouth daily.    Marland Kitchen torsemide (DEMADEX) 20 MG tablet Take 2 Tablets (40 mg) In the AM And and Take 1 Tablet ( 20 mg) In the Evening. 90 tablet 6  . UNIFINE PENTIPS 32G X 4 MM MISC USE TWICE A DAY TO TAKE INSULIN. 100 each 2   No current facility-administered medications for this visit.     Allergies:   Codeine and Decongestant [pseudoephedrine hcl er]    Social History:  The patient  reports that  has never smoked. she has never used smokeless tobacco. She reports that she does not drink alcohol or use drugs.   Family History:  The patient's family history includes Diabetes in her mother and sister; Heart attack in her brother; Heart failure in her mother; Hypertension in her father; Kidney disease in her mother; Seizures in her brother and sister; Stomach cancer in her father.    ROS: All other systems are reviewed and negative. Unless otherwise mentioned in H&P    PHYSICAL EXAM: VS:  BP 110/70   Pulse 79   Ht 5\' 1"  (1.549 m)   Wt 102 lb 12.8 oz (46.6 kg)   SpO2 91%   BMI 19.42 kg/m  , BMI Body mass index is 19.42 kg/m. GEN: Well nourished, well developed, in no acute distress  HEENT: normal alopecia is noted Neck: no JVD, carotid bruits, or masses Cardiac: RRR; systolic rumbling murmur, heard best at the left sternal border radiating into the apex, rubs, or gallops, nonpitting edema on the left lower extremity no edema on the right. Respiratory:  Clear to auscultation bilaterally, normal work of breathing GI: soft, nontender, nondistended, + BS MS: no deformity or atrophy  Skin: warm and dry, no rash, pale Neuro:  Strength and sensation are intact Psych: euthymic mood, full affect  Recent Labs: 09/23/2016: Magnesium 2.0 12/04/2016: ALT 32 03/24/2017: B Natriuretic Peptide  1,699.0 04/04/2017: BUN 15; Creatinine, Ser 0.70; Hemoglobin 12.7; Platelets 152; Potassium 4.1; Sodium 140; TSH 13.162    Lipid Panel    Component Value Date/Time   CHOL 99 09/21/2016 0513   CHOL 113 02/24/2016 0923   TRIG 100 09/21/2016 0513   HDL 33 (L) 09/21/2016 0513   HDL 36 (L) 02/24/2016 0923   CHOLHDL 3.0 09/21/2016 0513   VLDL 20 09/21/2016 0513   LDLCALC 46 09/21/2016 0513   LDLCALC 60 02/24/2016 0923      Wt Readings from Last 3 Encounters:  05/19/17 102 lb 12.8 oz (46.6 kg)  04/06/17 107 lb (48.5 kg)  03/30/17 108 lb (49 kg)      Other studies  Reviewed: Echocardiogram 09/22/2016 Left ventricle: Systolic function was severely reduced. The   estimated ejection fraction was in the range of 15% to 20%.   Diffuse hypokinesis. The study is not technically sufficient to   allow evaluation of LV diastolic function. - Aortic valve: Valve area (VTI): 2.03 cm^2. Valve area (Vmax):   2.29 cm^2. - Mitral valve: There was mild to moderate regurgitation. - Left atrium: The atrium was severely dilated. - Right ventricle: Systolic function was mildly reduced. - Right atrium: The atrium was mildly dilated. - Atrial septum: No defect or patent foramen ovale was identified. - Pericardium, extracardiac: There is a small to moderate   circumferential pericardial effusion. There is some indentation   of the RA in diastole. No evidence of respiratory flow variation   or RV collapse.  ASSESSMENT AND PLAN:  1.Combined HFrEF, nonischemic: This recent ejection fraction in April 2000 1815-20%.  She is medically compliant and denies any new symptoms.  She is quite sedentary but does get up and walk several times a day within her home and then returns to sitting in her chair with her feet elevated.  She has very good family support.  They make sure she takes her medications daily and have good nutrition.  She is not always compliant with low-sodium diet eating a lot of snacks.  No evidence  of fluid overload at this time.  We will recheck her labs to evaluate potassium and kidney function.   2.  Hypothyroidism: Followed by Dr. Luan Pulling, primary care physician.  Most recent TSH continued to be abnormal on 04/04/2017.  She states that her medications were not adjusted.  We will recheck T4 and TSH and send copy to primary care.  3.  BiV AICD in situ: Followed by EP with remote transmissions.  Continue to follow protocol.  4.  Diabetes: She states her blood sugars are up and down.  She needs to follow-up with primary care for ongoing management.  Current medicines are reviewed at length with the patient today.    Labs/ tests ordered today include: BMET, CBC, lipids LFTs, and thyroid profile. Phill Myron. [place] Pugh, ANP, AACC   05/19/2017 3:05 PM    [facility] [address] [place], [place] [address] Phone: [phone]; Fax: [phone]

## 2017-05-19 ENCOUNTER — Other Ambulatory Visit: Payer: Self-pay

## 2017-05-19 ENCOUNTER — Ambulatory Visit: Payer: Medicare HMO | Admitting: Adult Health

## 2017-05-19 ENCOUNTER — Other Ambulatory Visit (HOSPITAL_COMMUNITY)
Admission: RE | Admit: 2017-05-19 | Discharge: 2017-05-19 | Disposition: A | Discharge status: Home / Self Care | Payer: Medicare HMO | Source: Ambulatory Visit | Attending: Adult Health | Admitting: Adult Health

## 2017-05-19 ENCOUNTER — Encounter: Payer: Self-pay | Admitting: Adult Health

## 2017-05-19 ENCOUNTER — Other Ambulatory Visit: Payer: Self-pay | Admitting: Adult Health

## 2017-05-19 VITALS — BP 110/70 | HR 79 | Ht 61.0 in | Wt 102.8 lb

## 2017-05-19 DIAGNOSIS — N289 Disorder of kidney and ureter, unspecified: Secondary | ICD-10-CM | POA: Diagnosis not present

## 2017-05-19 DIAGNOSIS — I428 Other cardiomyopathies: Secondary | ICD-10-CM | POA: Diagnosis not present

## 2017-05-19 DIAGNOSIS — I340 Nonrheumatic mitral (valve) insufficiency: Secondary | ICD-10-CM

## 2017-05-19 DIAGNOSIS — R531 Weakness: Secondary | ICD-10-CM | POA: Diagnosis not present

## 2017-05-19 DIAGNOSIS — I5042 Chronic combined systolic (congestive) and diastolic (congestive) heart failure: Secondary | ICD-10-CM

## 2017-05-19 LAB — CBC WITH DIFFERENTIAL/PLATELET
Basophils Absolute: 0 10*3/uL (ref 0.0–0.1)
Basophils Relative: 0 %
Eosinophils Absolute: 0.1 10*3/uL (ref 0.0–0.7)
Eosinophils Relative: 1 %
HEMATOCRIT: 39.4 % (ref 36.0–46.0)
HEMOGLOBIN: 12.3 g/dL (ref 12.0–15.0)
LYMPHS ABS: 1.4 10*3/uL (ref 0.7–4.0)
Lymphocytes Relative: 25 %
MCH: 31.9 pg (ref 26.0–34.0)
MCHC: 31.2 g/dL (ref 30.0–36.0)
MCV: 102.3 fL — AB (ref 78.0–100.0)
MONO ABS: 0.3 10*3/uL (ref 0.1–1.0)
MONOS PCT: 5 %
NEUTROS ABS: 4 10*3/uL (ref 1.7–7.7)
NEUTROS PCT: 69 %
Platelets: 138 10*3/uL — ABNORMAL LOW (ref 150–400)
RBC: 3.85 MIL/uL — ABNORMAL LOW (ref 3.87–5.11)
RDW: 16.8 % — ABNORMAL HIGH (ref 11.5–15.5)
WBC: 5.7 10*3/uL (ref 4.0–10.5)

## 2017-05-19 LAB — COMPREHENSIVE METABOLIC PANEL
ALK PHOS: 75 U/L (ref 38–126)
ALT: 32 U/L (ref 14–54)
ANION GAP: 8 (ref 5–15)
AST: 40 U/L (ref 15–41)
Albumin: 4.1 g/dL (ref 3.5–5.0)
BILIRUBIN TOTAL: 1.1 mg/dL (ref 0.3–1.2)
BUN: 15 mg/dL (ref 6–20)
CO2: 32 mmol/L (ref 22–32)
CREATININE: 0.73 mg/dL (ref 0.44–1.00)
Calcium: 9 mg/dL (ref 8.9–10.3)
Chloride: 98 mmol/L — ABNORMAL LOW (ref 101–111)
Glucose, Bld: 212 mg/dL — ABNORMAL HIGH (ref 65–99)
Potassium: 3.5 mmol/L (ref 3.5–5.1)
Sodium: 138 mmol/L (ref 135–145)
Total Protein: 6.6 g/dL (ref 6.5–8.1)

## 2017-05-19 LAB — TSH: TSH: 19.701 u[IU]/mL — ABNORMAL HIGH (ref 0.350–4.500)

## 2017-05-19 MED ORDER — LEVOTHYROXINE SODIUM 100 MCG PO TABS: 100.0000 ug | ORAL_TABLET | Freq: Every day | ORAL | 11 refills | Status: DC

## 2017-05-19 NOTE — Progress Notes (Signed)
I have increased her synthroid to 100 mcg daily based upon TSH elevation. This has been sent to her pharmacy. She will still need to follow up with her PCP, Dr. Luan Pulling for ongoing management and labs.

## 2017-05-19 NOTE — Patient Instructions (Signed)
Medication Instructions:  Your physician recommends that you continue on your current medications as directed. Please refer to the Current Medication list given to you today.   Labwork: Your physician recommends that you return for lab work in: Today    Testing/Procedures: NONE   Follow-Up: Your physician recommends that you schedule a follow-up appointment in: 3 Months with Dr. Harl Bowie    Any Other Special Instructions Will Be Listed Below (If Applicable).     If you need a refill on your cardiac medications before your next appointment, please call your pharmacy. Thank you for choosing [facility]!

## 2017-05-20 LAB — HEMOGLOBIN A1C
Hgb A1c MFr Bld: 6.4 % — ABNORMAL HIGH (ref 4.8–5.6)
Mean Plasma Glucose: 137 mg/dL

## 2017-05-20 LAB — T3 UPTAKE: T3 UPTAKE RATIO: 30 % (ref 24–39)

## 2017-05-25 ENCOUNTER — Ambulatory Visit (INDEPENDENT_AMBULATORY_CARE_PROVIDER_SITE_OTHER): Payer: Medicare HMO

## 2017-05-25 DIAGNOSIS — Z95810 Presence of automatic (implantable) cardiac defibrillator: Secondary | ICD-10-CM | POA: Diagnosis not present

## 2017-05-25 DIAGNOSIS — I5022 Chronic systolic (congestive) heart failure: Secondary | ICD-10-CM | POA: Diagnosis not present

## 2017-05-26 NOTE — Progress Notes (Signed)
EPIC Encounter for ICM Monitoring  Patient Name: Donna [name] is a 76 y.o. female Date: 05/26/2017 Primary Care Physican: Sinda Du, MD Primary Cardiologist: Wardell Heath PA Electrophysiologist: Lovena Le Dry Weight:102 lbs (per 05/19/17 OV note) Bi-V Pacing: 98%      Transmission received.   Thoracic impedance normal.  Prescribed dosage: Torsemide 20 mg take 2 tablets (40 mg total) in AM and 1 tablet (20 mg total) in PM. Potassium 20 mEq 2 tablets in AM and 2 tablets in PM.   Labs: 05/19/2017 Creatinine 0.73, BUN 15, Potassium 3.5, Sodium 138, EGFR >60 04/04/2017 Creatinine 0.70, BUN 15, Potassium 4.1, Sodium 140, EGFR >60  05/30/2017 Creatinine 0.74, BUN 14, Potassium 3.1, Sodium 139, EGFR >60  03/24/2017 Creatinine 0.73, BUN 21, Potassium 4.8, Sodium 138, EGFR >60  03/03/2017 Creatinine 0.83, BUN 21, Potassium 4.3, Sodium 139, EGFR >60 12/04/2016 Creatinine 0.85, BUN 15, Potassium 3.8, Sodium 145, EGFR>60 09/24/2016 Creatinine 0.52, BUN 17, Potassium 4.2, Sodium 140, EGFR >60 09/23/2016 Creatinine 0.67, BUN 18, Potassium 2.5, Sodium 138, EGFR >60  09/20/2016 Creatinine 0.72, BUN 17, Potassium 4.0, Sodium 138, EGFR >60  08/31/2016 Creatinine 0.85, BUN 21, Potassium 4.1, Sodium 135, EGFR >60 06/08/2016 Creatinine 0.81, BUN 15, Potassium 4.6, Sodium 138, EGFR >60  Recommendations: None.  Follow-up plan: ICM clinic phone appointment on 06/26/2017.  Office appointment scheduled 06/07/2017 with Dr. Harl Bowie.  Copy of ICM check sent to Dr. Lovena Le.   3 month ICM trend: 05/25/2017    1 Year ICM trend:       [name], RN 05/26/2017 4:09 PM

## 2017-06-06 ENCOUNTER — Other Ambulatory Visit: Payer: Self-pay

## 2017-06-06 ENCOUNTER — Emergency Department (HOSPITAL_COMMUNITY): Payer: Medicare HMO

## 2017-06-06 ENCOUNTER — Inpatient Hospital Stay (HOSPITAL_COMMUNITY)
Admission: EM | Admit: 2017-06-06 | Discharge: 2017-06-13 | DRG: 291 | Disposition: A | Discharge status: Hospice | Payer: Medicare HMO | Attending: Internal Medicine | Admitting: Internal Medicine

## 2017-06-06 ENCOUNTER — Encounter (HOSPITAL_COMMUNITY): Payer: Self-pay

## 2017-06-06 DIAGNOSIS — R945 Abnormal results of liver function studies: Secondary | ICD-10-CM | POA: Diagnosis not present

## 2017-06-06 DIAGNOSIS — I313 Pericardial effusion (noninflammatory): Secondary | ICD-10-CM | POA: Diagnosis not present

## 2017-06-06 DIAGNOSIS — K5900 Constipation, unspecified: Secondary | ICD-10-CM | POA: Diagnosis present

## 2017-06-06 DIAGNOSIS — Z6820 Body mass index (BMI) 20.0-20.9, adult: Secondary | ICD-10-CM

## 2017-06-06 DIAGNOSIS — E872 Acidosis: Secondary | ICD-10-CM | POA: Diagnosis present

## 2017-06-06 DIAGNOSIS — I428 Other cardiomyopathies: Secondary | ICD-10-CM

## 2017-06-06 DIAGNOSIS — Z7401 Bed confinement status: Secondary | ICD-10-CM

## 2017-06-06 DIAGNOSIS — E43 Unspecified severe protein-calorie malnutrition: Secondary | ICD-10-CM | POA: Diagnosis present

## 2017-06-06 DIAGNOSIS — Z794 Long term (current) use of insulin: Secondary | ICD-10-CM | POA: Diagnosis not present

## 2017-06-06 DIAGNOSIS — G9341 Metabolic encephalopathy: Secondary | ICD-10-CM | POA: Diagnosis not present

## 2017-06-06 DIAGNOSIS — J9811 Atelectasis: Secondary | ICD-10-CM | POA: Diagnosis present

## 2017-06-06 DIAGNOSIS — J9601 Acute respiratory failure with hypoxia: Secondary | ICD-10-CM | POA: Diagnosis not present

## 2017-06-06 DIAGNOSIS — E039 Hypothyroidism, unspecified: Secondary | ICD-10-CM | POA: Diagnosis present

## 2017-06-06 DIAGNOSIS — J9690 Respiratory failure, unspecified, unspecified whether with hypoxia or hypercapnia: Secondary | ICD-10-CM | POA: Diagnosis not present

## 2017-06-06 DIAGNOSIS — D696 Thrombocytopenia, unspecified: Secondary | ICD-10-CM | POA: Diagnosis present

## 2017-06-06 DIAGNOSIS — K219 Gastro-esophageal reflux disease without esophagitis: Secondary | ICD-10-CM | POA: Diagnosis present

## 2017-06-06 DIAGNOSIS — E785 Hyperlipidemia, unspecified: Secondary | ICD-10-CM | POA: Diagnosis present

## 2017-06-06 DIAGNOSIS — I110 Hypertensive heart disease with heart failure: Principal | ICD-10-CM | POA: Diagnosis present

## 2017-06-06 DIAGNOSIS — R570 Cardiogenic shock: Secondary | ICD-10-CM | POA: Diagnosis present

## 2017-06-06 DIAGNOSIS — Z79899 Other long term (current) drug therapy: Secondary | ICD-10-CM | POA: Diagnosis not present

## 2017-06-06 DIAGNOSIS — E875 Hyperkalemia: Secondary | ICD-10-CM

## 2017-06-06 DIAGNOSIS — Z95810 Presence of automatic (implantable) cardiac defibrillator: Secondary | ICD-10-CM | POA: Diagnosis not present

## 2017-06-06 DIAGNOSIS — R918 Other nonspecific abnormal finding of lung field: Secondary | ICD-10-CM | POA: Diagnosis not present

## 2017-06-06 DIAGNOSIS — I2510 Atherosclerotic heart disease of native coronary artery without angina pectoris: Secondary | ICD-10-CM | POA: Diagnosis present

## 2017-06-06 DIAGNOSIS — I5023 Acute on chronic systolic (congestive) heart failure: Secondary | ICD-10-CM | POA: Diagnosis not present

## 2017-06-06 DIAGNOSIS — Z515 Encounter for palliative care: Secondary | ICD-10-CM | POA: Diagnosis not present

## 2017-06-06 DIAGNOSIS — Z66 Do not resuscitate: Secondary | ICD-10-CM | POA: Diagnosis present

## 2017-06-06 DIAGNOSIS — K2970 Gastritis, unspecified, without bleeding: Secondary | ICD-10-CM | POA: Diagnosis not present

## 2017-06-06 DIAGNOSIS — E8770 Fluid overload, unspecified: Secondary | ICD-10-CM | POA: Diagnosis not present

## 2017-06-06 DIAGNOSIS — E873 Alkalosis: Secondary | ICD-10-CM | POA: Diagnosis present

## 2017-06-06 DIAGNOSIS — I10 Essential (primary) hypertension: Secondary | ICD-10-CM | POA: Diagnosis present

## 2017-06-06 DIAGNOSIS — E119 Type 2 diabetes mellitus without complications: Secondary | ICD-10-CM | POA: Diagnosis not present

## 2017-06-06 DIAGNOSIS — J810 Acute pulmonary edema: Secondary | ICD-10-CM | POA: Diagnosis not present

## 2017-06-06 DIAGNOSIS — R64 Cachexia: Secondary | ICD-10-CM | POA: Diagnosis present

## 2017-06-06 DIAGNOSIS — M79609 Pain in unspecified limb: Secondary | ICD-10-CM | POA: Diagnosis not present

## 2017-06-06 DIAGNOSIS — R110 Nausea: Secondary | ICD-10-CM | POA: Diagnosis not present

## 2017-06-06 DIAGNOSIS — I5084 End stage heart failure: Secondary | ICD-10-CM | POA: Diagnosis present

## 2017-06-06 DIAGNOSIS — R4020 Unspecified coma: Secondary | ICD-10-CM | POA: Diagnosis not present

## 2017-06-06 DIAGNOSIS — I361 Nonrheumatic tricuspid (valve) insufficiency: Secondary | ICD-10-CM | POA: Diagnosis not present

## 2017-06-06 DIAGNOSIS — I5043 Acute on chronic combined systolic (congestive) and diastolic (congestive) heart failure: Secondary | ICD-10-CM | POA: Diagnosis present

## 2017-06-06 DIAGNOSIS — Z8673 Personal history of transient ischemic attack (TIA), and cerebral infarction without residual deficits: Secondary | ICD-10-CM

## 2017-06-06 DIAGNOSIS — I5040 Unspecified combined systolic (congestive) and diastolic (congestive) heart failure: Secondary | ICD-10-CM | POA: Diagnosis not present

## 2017-06-06 DIAGNOSIS — R7989 Other specified abnormal findings of blood chemistry: Secondary | ICD-10-CM

## 2017-06-06 DIAGNOSIS — I081 Rheumatic disorders of both mitral and tricuspid valves: Secondary | ICD-10-CM | POA: Diagnosis present

## 2017-06-06 DIAGNOSIS — R0602 Shortness of breath: Secondary | ICD-10-CM | POA: Diagnosis not present

## 2017-06-06 DIAGNOSIS — J90 Pleural effusion, not elsewhere classified: Secondary | ICD-10-CM | POA: Diagnosis not present

## 2017-06-06 DIAGNOSIS — Z7189 Other specified counseling: Secondary | ICD-10-CM

## 2017-06-06 DIAGNOSIS — Z7982 Long term (current) use of aspirin: Secondary | ICD-10-CM

## 2017-06-06 DIAGNOSIS — Z452 Encounter for adjustment and management of vascular access device: Secondary | ICD-10-CM | POA: Diagnosis not present

## 2017-06-06 DIAGNOSIS — F0390 Unspecified dementia without behavioral disturbance: Secondary | ICD-10-CM | POA: Diagnosis present

## 2017-06-06 DIAGNOSIS — R4182 Altered mental status, unspecified: Secondary | ICD-10-CM | POA: Diagnosis not present

## 2017-06-06 DIAGNOSIS — R079 Chest pain, unspecified: Secondary | ICD-10-CM | POA: Diagnosis not present

## 2017-06-06 DIAGNOSIS — E782 Mixed hyperlipidemia: Secondary | ICD-10-CM | POA: Diagnosis not present

## 2017-06-06 DIAGNOSIS — I509 Heart failure, unspecified: Secondary | ICD-10-CM | POA: Diagnosis not present

## 2017-06-06 LAB — I-STAT CHEM 8, ED
BUN: 28 mg/dL — ABNORMAL HIGH (ref 6–20)
CHLORIDE: 109 mmol/L (ref 101–111)
Calcium, Ion: 1.08 mmol/L — ABNORMAL LOW (ref 1.15–1.40)
Creatinine, Ser: 0.8 mg/dL (ref 0.44–1.00)
Glucose, Bld: 152 mg/dL — ABNORMAL HIGH (ref 65–99)
HEMATOCRIT: 43 % (ref 36.0–46.0)
HEMOGLOBIN: 14.6 g/dL (ref 12.0–15.0)
POTASSIUM: 6.1 mmol/L — AB (ref 3.5–5.1)
Sodium: 135 mmol/L (ref 135–145)
TCO2: 18 mmol/L — ABNORMAL LOW (ref 22–32)

## 2017-06-06 LAB — URINALYSIS, ROUTINE W REFLEX MICROSCOPIC
BACTERIA UA: NONE SEEN
BILIRUBIN URINE: NEGATIVE
GLUCOSE, UA: NEGATIVE mg/dL
Hgb urine dipstick: NEGATIVE
KETONES UR: 5 mg/dL — AB
Leukocytes, UA: NEGATIVE
Nitrite: NEGATIVE
PROTEIN: 100 mg/dL — AB
Specific Gravity, Urine: 1.021 (ref 1.005–1.030)
Squamous Epithelial / LPF: NONE SEEN
pH: 6 (ref 5.0–8.0)

## 2017-06-06 LAB — I-STAT ARTERIAL BLOOD GAS, ED
Acid-base deficit: 8 mmol/L — ABNORMAL HIGH (ref 0.0–2.0)
Bicarbonate: 18 mmol/L — ABNORMAL LOW (ref 20.0–28.0)
O2 Saturation: 100 %
PH ART: 7.295 — AB (ref 7.350–7.450)
PO2 ART: 214 mmHg — AB (ref 83.0–108.0)
Patient temperature: 36.1
TCO2: 19 mmol/L — ABNORMAL LOW (ref 22–32)
pCO2 arterial: 36.6 mmHg (ref 32.0–48.0)

## 2017-06-06 LAB — COMPREHENSIVE METABOLIC PANEL
ALT: 256 U/L — ABNORMAL HIGH (ref 14–54)
ANION GAP: 13 (ref 5–15)
AST: 300 U/L — AB (ref 15–41)
Albumin: 3.9 g/dL (ref 3.5–5.0)
Alkaline Phosphatase: 77 U/L (ref 38–126)
BILIRUBIN TOTAL: 1.7 mg/dL — AB (ref 0.3–1.2)
BUN: 17 mg/dL (ref 6–20)
CHLORIDE: 107 mmol/L (ref 101–111)
CO2: 15 mmol/L — ABNORMAL LOW (ref 22–32)
Calcium: 9.6 mg/dL (ref 8.9–10.3)
Creatinine, Ser: 0.91 mg/dL (ref 0.44–1.00)
GFR calc Af Amer: >60 mL/min (ref >60)
GFR, EST NON AFRICAN AMERICAN: 59 mL/min — AB (ref >60)
Glucose, Bld: 138 mg/dL — ABNORMAL HIGH (ref 65–99)
POTASSIUM: 7 mmol/L — AB (ref 3.5–5.1)
Sodium: 135 mmol/L (ref 135–145)
TOTAL PROTEIN: 6.4 g/dL — AB (ref 6.5–8.1)

## 2017-06-06 LAB — CBC
HEMATOCRIT: 42.7 % (ref 36.0–46.0)
HEMOGLOBIN: 13.6 g/dL (ref 12.0–15.0)
MCH: 33 pg (ref 26.0–34.0)
MCHC: 31.9 g/dL (ref 30.0–36.0)
MCV: 103.6 fL — ABNORMAL HIGH (ref 78.0–100.0)
Platelets: 137 10*3/uL — ABNORMAL LOW (ref 150–400)
RBC: 4.12 MIL/uL (ref 3.87–5.11)
RDW: 16.9 % — ABNORMAL HIGH (ref 11.5–15.5)
WBC: 8.4 10*3/uL (ref 4.0–10.5)

## 2017-06-06 LAB — BRAIN NATRIURETIC PEPTIDE: B NATRIURETIC PEPTIDE 5: 2063.5 pg/mL — AB (ref 0.0–100.0)

## 2017-06-06 LAB — I-STAT CG4 LACTIC ACID, ED: Lactic Acid, Venous: 3.82 mmol/L (ref 0.5–1.9)

## 2017-06-06 LAB — I-STAT TROPONIN, ED: Troponin i, poc: 0 ng/mL (ref 0.00–0.08)

## 2017-06-06 MED ORDER — VANCOMYCIN HCL IN DEXTROSE 1-5 GM/200ML-% IV SOLN
1000.0000 mg | INTRAVENOUS | Status: DC
  Administered 2017-06-07: 1000 mg via INTRAVENOUS
  Filled 2017-06-06: qty 200

## 2017-06-06 MED ORDER — SODIUM BICARBONATE 8.4 % IV SOLN
50.0000 meq | Freq: Once | INTRAVENOUS | Status: AC
  Administered 2017-06-06: 50 meq via INTRAVENOUS
  Filled 2017-06-06: qty 50

## 2017-06-06 MED ORDER — ORAL CARE MOUTH RINSE
15.0000 mL | Freq: Four times a day (QID) | OROMUCOSAL | Status: DC
  Administered 2017-06-07 – 2017-06-09 (×12): 15 mL via OROMUCOSAL

## 2017-06-06 MED ORDER — INSULIN ASPART 100 UNIT/ML ~~LOC~~ SOLN
2.0000 [IU] | SUBCUTANEOUS | Status: DC
  Administered 2017-06-07: 2 [IU] via SUBCUTANEOUS
  Administered 2017-06-08 (×3): 4 [IU] via SUBCUTANEOUS
  Administered 2017-06-08: 6 [IU] via SUBCUTANEOUS
  Administered 2017-06-08 – 2017-06-09 (×3): 4 [IU] via SUBCUTANEOUS
  Administered 2017-06-09: 2 [IU] via SUBCUTANEOUS
  Administered 2017-06-09 (×3): 4 [IU] via SUBCUTANEOUS
  Administered 2017-06-10 – 2017-06-11 (×3): 2 [IU] via SUBCUTANEOUS
  Administered 2017-06-11: 4 [IU] via SUBCUTANEOUS
  Administered 2017-06-11: 2 [IU] via SUBCUTANEOUS
  Administered 2017-06-12: 6 [IU] via SUBCUTANEOUS
  Administered 2017-06-12 (×2): 2 [IU] via SUBCUTANEOUS

## 2017-06-06 MED ORDER — PROPOFOL 1000 MG/100ML IV EMUL
0.0000 ug/kg/min | INTRAVENOUS | Status: DC
  Administered 2017-06-06 – 2017-06-07 (×2): 20 ug/kg/min via INTRAVENOUS
  Filled 2017-06-06: qty 100

## 2017-06-06 MED ORDER — FENTANYL BOLUS VIA INFUSION
25.0000 ug | INTRAVENOUS | Status: DC | PRN
  Filled 2017-06-06: qty 25

## 2017-06-06 MED ORDER — ONDANSETRON HCL 4 MG/2ML IJ SOLN
4.0000 mg | Freq: Four times a day (QID) | INTRAMUSCULAR | Status: DC | PRN
  Administered 2017-06-09: 4 mg via INTRAVENOUS
  Filled 2017-06-06: qty 2

## 2017-06-06 MED ORDER — PIPERACILLIN-TAZOBACTAM 3.375 G IVPB 30 MIN
3.3750 g | Freq: Once | INTRAVENOUS | Status: AC
  Administered 2017-06-06: 3.375 g via INTRAVENOUS
  Filled 2017-06-06: qty 50

## 2017-06-06 MED ORDER — ASPIRIN 81 MG PO CHEW: 324.0000 mg | CHEWABLE_TABLET | ORAL | Status: AC

## 2017-06-06 MED ORDER — PROPOFOL 1000 MG/100ML IV EMUL
5.0000 ug/kg/min | Freq: Once | INTRAVENOUS | Status: AC
  Administered 2017-06-06: 20 ug/kg/min via INTRAVENOUS

## 2017-06-06 MED ORDER — SODIUM CHLORIDE 0.9 % IV SOLN: 250.0000 mL | INTRAVENOUS | Status: DC | PRN

## 2017-06-06 MED ORDER — VANCOMYCIN HCL IN DEXTROSE 1-5 GM/200ML-% IV SOLN
1000.0000 mg | Freq: Once | INTRAVENOUS | Status: AC
  Administered 2017-06-06: 1000 mg via INTRAVENOUS
  Filled 2017-06-06: qty 200

## 2017-06-06 MED ORDER — PANTOPRAZOLE SODIUM 40 MG IV SOLR
40.0000 mg | Freq: Every day | INTRAVENOUS | Status: DC
  Administered 2017-06-06 – 2017-06-08 (×3): 40 mg via INTRAVENOUS
  Filled 2017-06-06 (×3): qty 40

## 2017-06-06 MED ORDER — FUROSEMIDE 10 MG/ML IJ SOLN
INTRAMUSCULAR | Status: AC | PRN
  Administered 2017-06-06: 40 mg via INTRAVENOUS

## 2017-06-06 MED ORDER — ETOMIDATE 2 MG/ML IV SOLN
INTRAVENOUS | Status: AC | PRN
  Administered 2017-06-06: 15 mg via INTRAVENOUS

## 2017-06-06 MED ORDER — INSULIN ASPART 100 UNIT/ML ~~LOC~~ SOLN
10.0000 [IU] | Freq: Once | SUBCUTANEOUS | Status: AC
  Administered 2017-06-06: 10 [IU] via INTRAVENOUS

## 2017-06-06 MED ORDER — PROPOFOL 1000 MG/100ML IV EMUL
INTRAVENOUS | Status: AC
  Filled 2017-06-06: qty 100

## 2017-06-06 MED ORDER — ROCURONIUM BROMIDE 50 MG/5ML IV SOLN
INTRAVENOUS | Status: AC | PRN
  Administered 2017-06-06: 50 mg via INTRAVENOUS

## 2017-06-06 MED ORDER — FENTANYL CITRATE (PF) 100 MCG/2ML IJ SOLN
50.0000 ug | Freq: Once | INTRAMUSCULAR | Status: AC
  Administered 2017-06-06: 50 ug via INTRAVENOUS

## 2017-06-06 MED ORDER — CALCIUM GLUCONATE 10 % IV SOLN
1.0000 g | Freq: Once | INTRAVENOUS | Status: AC
  Administered 2017-06-06: 1 g via INTRAVENOUS
  Filled 2017-06-06: qty 10

## 2017-06-06 MED ORDER — HEPARIN SODIUM (PORCINE) 5000 UNIT/ML IJ SOLN
5000.0000 [IU] | Freq: Three times a day (TID) | INTRAMUSCULAR | Status: DC
  Administered 2017-06-06 – 2017-06-12 (×17): 5000 [IU] via SUBCUTANEOUS
  Filled 2017-06-06 (×16): qty 1

## 2017-06-06 MED ORDER — NOREPINEPHRINE BITARTRATE 1 MG/ML IV SOLN
0.0000 ug/min | Freq: Once | INTRAVENOUS | Status: AC
  Administered 2017-06-06: 4 ug/min via INTRAVENOUS
  Filled 2017-06-06: qty 4

## 2017-06-06 MED ORDER — PIPERACILLIN-TAZOBACTAM 3.375 G IVPB
3.3750 g | Freq: Three times a day (TID) | INTRAVENOUS | Status: DC
  Administered 2017-06-07 – 2017-06-10 (×10): 3.375 g via INTRAVENOUS
  Filled 2017-06-06 (×11): qty 50

## 2017-06-06 MED ORDER — SODIUM CHLORIDE 0.9 % IV SOLN
INTRAVENOUS | Status: AC
Start: 2017-06-06 — End: 2017-06-06
  Administered 2017-06-06: 20:00:00 via INTRAVENOUS

## 2017-06-06 MED ORDER — FENTANYL 2500MCG IN NS 250ML (10MCG/ML) PREMIX INFUSION
25.0000 ug/h | INTRAVENOUS | Status: DC
  Administered 2017-06-06: 50 ug/h via INTRAVENOUS
  Filled 2017-06-06: qty 250

## 2017-06-06 MED ORDER — FUROSEMIDE 10 MG/ML IJ SOLN
INTRAMUSCULAR | Status: AC
  Filled 2017-06-06: qty 4

## 2017-06-06 MED ORDER — INSULIN ASPART 100 UNIT/ML ~~LOC~~ SOLN
SUBCUTANEOUS | Status: AC
  Filled 2017-06-06: qty 1

## 2017-06-06 MED ORDER — CHLORHEXIDINE GLUCONATE 0.12% ORAL RINSE (MEDLINE KIT)
15.0000 mL | Freq: Two times a day (BID) | OROMUCOSAL | Status: DC
  Administered 2017-06-06 – 2017-06-09 (×6): 15 mL via OROMUCOSAL

## 2017-06-06 MED ORDER — DEXTROSE 50 % IV SOLN
1.0000 | Freq: Once | INTRAVENOUS | Status: AC
  Administered 2017-06-06: 50 mL via INTRAVENOUS
  Filled 2017-06-06: qty 50

## 2017-06-06 MED ORDER — ASPIRIN 300 MG RE SUPP
300.0000 mg | RECTAL | Status: AC
  Administered 2017-06-06: 300 mg via RECTAL
  Filled 2017-06-06: qty 1

## 2017-06-06 NOTE — ED Notes (Signed)
MD at bedside for Central line placement

## 2017-06-06 NOTE — Progress Notes (Signed)
Pharmacy Antibiotic Note  Donna [name] is a 77 y.o. female admitted on 06/06/2017 with possible sepsis. Pharmacy has been consulted for vancomycin and Zosyn dosing. WBC WNL, no temperature documented.   Plan: Vancomycin 1000 mg IV every 24 hours. Goal trough 15-20 mcg/mL. Zosyn 3.375g IV q8h (4 hour infusion). Monitor renal function, clinical status, C&S, levels as appropriate  Height: 5\' 1"  (154.9 cm) Weight: 100 lb (45.4 kg) IBW/kg (Calculated) : 47.8  No data recorded.  Recent Labs  Lab 06/06/17 1928 06/06/17 1935  WBC 8.4  --   CREATININE  --  0.80    Estimated Creatinine Clearance: 42.2 mL/min (by C-G formula based on SCr of 0.8 mg/dL).   Allergies  Allergen Reactions  . Codeine Other (See Comments)    Reaction:  Racing heart   . Decongestant [Pseudoephedrine Hcl Er] Other (See Comments)    Reaction:  Racing heart    Antimicrobials this admission: Vancomycin 1/1 >>  Zosyn 1/1 >>   Dose adjustments this admission: None  Microbiology results: 1/1 BCx x2: ordered 1/1 UCx: collected   Thank you for allowing pharmacy to be a part of this patient's care.  Mila Merry Gerarda Fraction, PharmD PGY1 Pharmacy Resident Pager: [phone] 06/06/2017 8:25 PM

## 2017-06-06 NOTE — ED Notes (Signed)
PCCM at bedside with family

## 2017-06-06 NOTE — Progress Notes (Signed)
RT transported patient from ED to 2H without any complications.  

## 2017-06-06 NOTE — ED Notes (Signed)
Patient is stable and ready to be transport to the floor at this time.  Report was called to 2H RN.  Belongings taken with the patient to the floor.   

## 2017-06-06 NOTE — ED Provider Notes (Addendum)
[facility] EMERGENCY DEPARTMENT Provider Note   CSN: [id number] Arrival date & time: 06/06/17  1921     History   Chief Complaint Chief Complaint  Patient presents with  . Weakness    HPI Donna [name] is a 77 y.o. female.  HPI  The patient is a 77 year old female, she has a Environmental health practitioner biventricular pacer ICD which was placed in 2016, the patient has a history of congestive heart failure with an ejection fraction of 15-20% with diffuse hypokinesis.  The patient lives at home with her daughter according to the paramedics, they were called out for altered mental status and progressive shortness of breath.  When they found the patient she was in respiratory distress requiring a nonrebreather but oxygenating in the mid 80s.  The patient was unable to give much in the way of information secondary to her severe distress.  They did note that she was hypotensive and had a very abnormal EKG with a wide QRS complex.  They in coded as a possible STEMI, level 5 caveat applies secondary to the acuity of the condition.  Past Medical History:  Diagnosis Date  . AICD (automatic cardioverter/defibrillator) present   . Arthritis    "fingers" (11/18/2014)  . Atrial flutter (HCC)    Versus ventricular tachycardia; treated with amiodarone  . Cardiomyopathy, nonischemic ([place])    Nl cors in [place]; CHF in 12/97; EF of 40% in 5/01 and 7/03, 25% in 9/04 and 4/08.  systolic murmur without valvular abnormalities by echo; refused automatic implantable cardiac defibrillator  . Carotid artery disease ([place])    a. duplex 09/2016 <50% stenosis.  . Chronic systolic CHF (congestive heart failure) ([place])   . Diabetes mellitus, type II ([place])    No insulin  . Gastroesophageal reflux disease   . Hyperlipidemia   . Hypertension   . Left bundle branch block   . Mitral regurgitation   . Pericardial effusion    a. small by echo 2016, not seen in 2018.  . Stroke (cerebrum) ([place]) 09/2016  .  Ventricular tachycardia ([place])    a. PMH it lists "atrial flutter versus ventricular tachycardia treated with amiodarone" - details of this are unclear. Notes from back to 2006 indicate she has been maintained on amiodarone but do not describe further indication. Patient's sister reports pt was told she had skipped beats that could cause her to drop dead - Dr. Lattie Haw put her on amiodarone and recommended a defibrillator.    Patient Active Problem List   Diagnosis Date Noted  . Cerebrovascular disease 09/20/2016  . AICD (automatic cardioverter/defibrillator) present 09/20/2016  . Acute CVA (cerebrovascular accident) ([place]) 09/20/2016  . Confusion 06/27/2016  . Nausea 03/09/2016  . Dizziness 02/03/2016  . Fall on flat surface, tripped on rug; no injury 01/19/2016    Class: Acute  . Hypokalemia 05/18/2015  . Acute on chronic systolic congestive heart failure ([place]) 05/16/2015  . NICM (nonischemic cardiomyopathy) ([place]) 11/18/2014  . Acute respiratory failure with hypoxia ([place]) 10/18/2014  . Abnormality of gait 09/26/2013  . Weakness of left hip 09/26/2013  . Diabetes mellitus, type II ([place]) 06/15/2011  . Cardiomyopathy, nonischemic ([place])   . Hypertension   . Hypothyroidism 05/28/2009  . Left bundle branch block 05/28/2009  . GASTROESOPHAGEAL REFLUX DISEASE 05/28/2009  . Hyperlipidemia 05/14/2008  . VENTRICULAR TACHYCARDIA 05/14/2008  . CEREBROVASCULAR DISEASE 05/14/2008    Past Surgical History:  Procedure Laterality Date  . ABDOMINAL HYSTERECTOMY  2006  . BI-VENTRICULAR IMPLANTABLE  CARDIOVERTER DEFIBRILLATOR  (CRT-D)  11/18/2014  . COLONOSCOPY  2008  . EP IMPLANTABLE DEVICE N/A 11/18/2014   Procedure: BiV ICD Insertion CRT-D;  Surgeon: Evans Lance, MD;  Location: [facility];  Service: Cardiovascular;  Laterality: N/A;  . HERNIA REPAIR Right   . TONSILLECTOMY      OB History    Gravida Para Term Preterm AB Living             2   SAB TAB Ectopic Multiple Live Births                    Home Medications    Prior to Admission medications   Medication Sig Start Date End Date Taking? Authorizing Provider  acetaminophen (TYLENOL) 500 MG tablet Take 500 mg by mouth every 6 (six) hours as needed.    [provider]  amiodarone (PACERONE) 200 MG tablet Take 100 mg by mouth daily at 12 noon.    [provider]  aspirin EC 325 MG EC tablet Take 1 tablet (325 mg total) by mouth daily. 09/25/16   Sinda Du, MD  atorvastatin (LIPITOR) 20 MG tablet Take 1 tablet (20 mg total) by mouth daily at 6 PM. 07/24/14   Sinda Du, MD  Calcium Carb-Cholecalciferol (CALCIUM 600 + D) 600-200 MG-UNIT TABS Take 1 tablet by mouth daily.    [provider]  insulin detemir (LEVEMIR) 100 UNIT/ML injection Inject 12 Units into the skin at bedtime.    Sinda Du, MD  IRON PO Take 1 tablet by mouth daily.    [provider]  levothyroxine (SYNTHROID) 100 MCG tablet Take 1 tablet (100 mcg total) by mouth daily. 05/19/17 05/19/18  Lendon Colonel, NP  loratadine (CLARITIN) 10 MG tablet Take 10 mg by mouth daily as needed for allergies.    [provider]  losartan (COZAAR) 25 MG tablet Take 0.5 tablets (12.5 mg total) by mouth daily. 06/29/16 08/31/17  Imogene Burn, PA-C  Omega-3 Fatty Acids (FISH OIL PO) Take by mouth 3 (three) times daily.    [provider]  potassium chloride SA (KLOR-CON M20) 20 MEQ tablet Take 2 tablets in the am , and 2 tablets in the pm 03/31/17   Dunn, Dayna N, PA-C  sitaGLIPtin (JANUVIA) 100 MG tablet Take 100 mg by mouth daily.    [provider]  torsemide (DEMADEX) 20 MG tablet Take 2 Tablets (40 mg) In the AM And and Take 1 Tablet ( 20 mg) In the Evening. 03/28/17   Dunn, Nedra Hai, PA-C  UNIFINE PENTIPS 32G X 4 MM MISC USE TWICE A DAY TO TAKE INSULIN. 01/06/16   Cassandria Anger, MD    Family History Family History  Problem Relation Age of Onset  . Diabetes Mother   .  Kidney disease Mother   . Heart failure Mother   . Stomach cancer Father   . Hypertension Father   . Diabetes Sister   . Seizures Sister   . Heart attack Brother   . Seizures Brother   . Dementia Neg Hx     Social History Social History   Tobacco Use  . Smoking status: Never Smoker  . Smokeless tobacco: Never Used  Substance Use Topics  . Alcohol use: No    Alcohol/week: 0.0 oz  . Drug use: No     Allergies   Codeine and Decongestant [pseudoephedrine hcl er]   Review of Systems Review of Systems  Unable to perform  ROS: Acuity of condition     Physical Exam Updated Vital Signs BP 117/76   Pulse 69   Resp 18   Ht 5' 1"  (1.549 m)   Wt 45.4 kg (100 lb)   SpO2 (!) 77%   BMI 18.89 kg/m   Physical Exam  Constitutional: She appears well-developed and well-nourished. She appears distressed.  HENT:  Head: Normocephalic and atraumatic.  Mouth/Throat: Oropharynx is clear and moist. No oropharyngeal exudate.  Eyes: Conjunctivae and EOM are normal. Pupils are equal, round, and reactive to light. Right eye exhibits no discharge. Left eye exhibits no discharge. No scleral icterus.  Neck: Normal range of motion. Neck supple. No JVD present. No thyromegaly present.  Cardiovascular: Normal rate, regular rhythm and normal heart sounds. Exam reveals no gallop and no friction rub.  No murmur heard. Very weak distal pulses  Pulmonary/Chest: She is in respiratory distress. She has no wheezes. She has rales.  Increased work of breathing with accessory muscle use, supraclavicular retractions and diffuse rales.  JVD present  Abdominal: Soft. Bowel sounds are normal. She exhibits no distension and no mass. There is no tenderness.  Musculoskeletal: Normal range of motion. She exhibits edema. She exhibits no tenderness.  Severe bilateral lower extremity edema pitting above the knees, symmetrical  Lymphadenopathy:    She has no cervical adenopathy.  Neurological: She is alert.  Coordination normal.  The patient is somnolent but arousable to voice, able to squeeze with both hands, able to speak though her voice is extremely weak, she is drooling out of the right side of her mouth but does not have any facial droop, no gag deficit  Skin: Skin is warm and dry. No rash noted. No erythema.  Psychiatric: She has a normal mood and affect. Her behavior is normal.  Nursing note and vitals reviewed.    ED Treatments / Results  Labs (all labs ordered are listed, but only abnormal results are displayed) Labs Reviewed  CBC - Abnormal; Notable for the following components:      Result Value   MCV 103.6 (*)    RDW 16.9 (*)    Platelets 137 (*)    All other components within normal limits  BRAIN NATRIURETIC PEPTIDE - Abnormal; Notable for the following components:   B Natriuretic Peptide 2,063.5 (*)    All other components within normal limits  I-STAT CHEM 8, ED - Abnormal; Notable for the following components:   Potassium 6.1 (*)    BUN 28 (*)    Glucose, Bld 152 (*)    Calcium, Ion 1.08 (*)    TCO2 18 (*)    All other components within normal limits  CULTURE, BLOOD (ROUTINE X 2)  CULTURE, BLOOD (ROUTINE X 2)  URINE CULTURE  BASIC METABOLIC PANEL  BLOOD GAS, ARTERIAL  URINALYSIS, ROUTINE W REFLEX MICROSCOPIC  I-STAT TROPONIN, ED  I-STAT CG4 LACTIC ACID, ED    EKG  EKG Interpretation  Date/Time:  Tuesday June 06 2017 19:24:53 EST Ventricular Rate:  85 PR Interval:    QRS Duration: 215 QT Interval:  542 QTC Calculation: 645 R Axis:   -58 Text Interpretation:  ventricular paced rhythm Nonspecific IVCD with LAD LVH with secondary repolarization abnormality Inferior infarct, old Anterior infarct, old Lateral leads are also involved Baseline wander in lead(s) I II aVR slt changed Confirmed by Noemi Chapel [phone]) on 06/06/2017 7:32:21 PM       Radiology Dg Chest Portable 1 View  Result Date: 06/06/2017 CLINICAL DATA:  Encounter for  OG tube placement  EXAM: PORTABLE CHEST 1 VIEW COMPARISON:  06/06/2017 FINDINGS: There is a left chest wall ICD with leads in the right atrial appendage, coronary sinus and right ventricle. ET tube tip is above the carina. There is a enteric tube with side port below the GE junction. Cardiac enlargement and aortic atherosclerosis noted. Bilateral pleural effusions noted left greater than right. Pulmonary edema pattern is similar to previous exam. The right upper lobe airspace opacity is also unchanged. IMPRESSION: 1. ETT tip is just above the carina. The side port for the enteric tube is below the GE junction. 2. Cardiac enlargement and Aortic Atherosclerosis (ICD10-I70.0). 3. Congestive heart failure. 4. No change in aeration to the right upper lobe compared with previous exam Electronically Signed   By: Kerby Moors M.D.   On: 06/06/2017 20:12   Dg Chest Portable 1 View  Result Date: 06/06/2017 CLINICAL DATA:  Chest pain and respiratory failure. EXAM: PORTABLE CHEST 1 VIEW COMPARISON:  03/03/2017 FINDINGS: Pacer/AICD device. Other leads and wires project over the chest. Midline trachea. Cardiomegaly accentuated by AP portable technique. Left pleural space not well evaluated. No right-sided pleural effusion or pneumothorax. Right greater than left interstitial and airspace disease. IMPRESSION: Bilateral interstitial and airspace disease, at least partially felt to represent pulmonary edema. Infection, especially within the right upper lobe, cannot be excluded. Suboptimal evaluation of the inferior left hemithorax secondary to overlying pacer battery. Electronically Signed   By: Abigail Miyamoto M.D.   On: 06/06/2017 19:46    Procedures .Critical Care Performed by: Noemi Chapel, MD Authorized by: Noemi Chapel, MD   Critical care provider statement:    Critical care time (minutes):  35   Critical care time was exclusive of:  Separately billable procedures and treating other patients and teaching time   Critical care was  necessary to treat or prevent imminent or life-threatening deterioration of the following conditions:  Cardiac failure   Critical care was time spent personally by me on the following activities:  Blood draw for specimens, development of treatment plan with patient or surrogate, discussions with consultants, evaluation of patient's response to treatment, examination of patient, obtaining history from patient or surrogate, ordering and performing treatments and interventions, ordering and review of laboratory studies, pulse oximetry, re-evaluation of patient's condition, review of old charts and ordering and review of radiographic studies  Procedure Name: Intubation Date/Time: 06/06/2017 8:20 PM Performed by: Noemi Chapel, MD Pre-anesthesia Checklist: Patient identified, Patient being monitored, Timeout performed, Emergency Drugs available and Suction available Oxygen Delivery Method: Ambu bag Preoxygenation: Pre-oxygenation with 100% oxygen Induction Type: Rapid sequence Ventilation: Mask ventilation without difficulty Laryngoscope Size: Mac and 4 Grade View: Grade III Tube size: 7.5 mm Number of attempts: 1 Airway Equipment and Method: Stylet Placement Confirmation: ETT inserted through vocal cords under direct vision,  Positive ETCO2 and Breath sounds checked- equal and bilateral Secured at: 24 cm Tube secured with: ETT holder Dental Injury: Teeth and Oropharynx as per pre-operative assessment     .Central Line Date/Time: 06/06/2017 8:21 PM Performed by: Noemi Chapel, MD Authorized by: Noemi Chapel, MD   Consent:    Consent obtained:  Emergent situation and verbal (from children) Pre-procedure details:    Hand hygiene: Hand hygiene performed prior to insertion     Sterile barrier technique: All elements of maximal sterile technique followed     Skin preparation:  ChloraPrep   Skin preparation agent: Skin preparation agent completely dried prior to procedure   Anesthesia (see MAR  for exact dosages):    Anesthesia method:  None Procedure details:    Location:  R internal jugular   Procedural supplies:  Triple lumen   Catheter size:  7.5 Fr   Landmarks identified: yes     Ultrasound guidance: yes     Number of attempts:  1   Successful placement: yes   Post-procedure details:    Post-procedure:  Dressing applied and line sutured   Assessment:  Blood return through all ports, no pneumothorax on x-ray, placement verified by x-ray and free fluid flow   Patient tolerance of procedure:  Tolerated well, no immediate complications       (including critical care time)   Medications Ordered in ED Medications  0.9 %  sodium chloride infusion ( Intravenous Stopped 06/06/17 2004)  propofol (DIPRIVAN) 1000 MG/100ML infusion (not administered)  piperacillin-tazobactam (ZOSYN) IVPB 3.375 g (not administered)  vancomycin (VANCOCIN) IVPB 1000 mg/200 mL premix (not administered)  vancomycin (VANCOCIN) IVPB 1000 mg/200 mL premix (not administered)  piperacillin-tazobactam (ZOSYN) IVPB 3.375 g (not administered)  norepinephrine (LEVOPHED) 4 mg in dextrose 5 % 250 mL (0.016 mg/mL) infusion (8 mcg/min Intravenous Rate/Dose Change 06/06/17 2002)  etomidate (AMIDATE) injection (15 mg Intravenous Given 06/06/17 1952)  rocuronium (ZEMURON) injection (50 mg Intravenous Given 06/06/17 1953)  furosemide (LASIX) injection ( Intravenous Canceled Entry 06/06/17 2000)  propofol (DIPRIVAN) 1000 MG/100ML infusion (20 mcg/kg/min  45.4 kg Intravenous New Bag/Given 06/06/17 2008)     Initial Impression / Assessment and Plan / ED Course  I have reviewed the triage vital signs and the nursing notes.  Pertinent labs & imaging results that were available during my care of the patient were reviewed by me and considered in my medical decision making (see chart for details).    The cardiologist Dr. Ellyn Hack has been at the bedside with me to evaluate the patient and we both believe that the patient is not  having a STEMI based on her EKG however she is obviously in acute congestive heart failure with diffuse fluid overload, she is hypotensive and has a very abnormal EKG suggesting an alternative pathology.  I have added some IV fluids for her hypotension despite her fluid overloaded state this is necessary.  The patient will also get Levophed as needed, she is requiring BiPAP due to her persistent hypoxia, she has agreed to intubation should she need it.  The patient has failed BiPAP with persistent hypoxia and respiratory distress with progressive weakness and somnolence, she required intubation because of oxygen saturations that trended from the 85 down to 80%.  Intubation was successful on the first attempt, she was persistently hypotensive despite some fluid resuscitation and thus levo fed was started.  This initially helped with blood pressures ranging up over 110.  She was started on some low-dose propofol for sedation.  She is tolerating this well however she is obviously acutely ill.  I discussed this with her family, they are aware that she could die and that she is severely critically ill.  She could also be septic, I have asked for cultures, antibiotics, lactic acid.  She does have pulmonary edema and peripheral edema, in the absence of a fever I feel like this is the most likely source however single dose of antibiotics will be started in the ER before handoff to ICU team.  Intensive care unit physician paged at 8:20 PM  D/w ICU attending - will send team to admit  I was brought to my attention that the patient's potassium  hemolyzed, her blood sample was is not usable, it was redrawn and found to be 7, this is more consistent with her initial EKG.  She was given intravenous calcium, bicarbonate, insulin and glucose, critical care will be made aware.  She is going to the ICU.  Her repeat EKG is much better  Final Clinical Impressions(s) / ED Diagnoses   Final diagnoses:  Acute pulmonary  edema ([place])  Acute respiratory failure with hypoxia [facility])  Hyperkalemia    ED Discharge Orders    None       Noemi Chapel, MD 06/06/17 2035    Noemi Chapel, MD 06/06/17 2130

## 2017-06-06 NOTE — H&P (Addendum)
.. ..  Name: Donna [name] MRN: [id number] DOB: [birth year]    ADMISSION DATE:  06/06/2017 CONSULTATION DATE:  06/06/17  REFERRING MD :  Sabra Heck MD  CHIEF COMPLAINT:  Alt mental status, SOB, Weakness, abdominal pain, s/p vomitting  BRIEF PATIENT DESCRIPTION: 77 yr old female with PMHx CAD, NICMP EF 15-20%, s/p AICD, NIDDM, presents from home for altered mental status and shortness of breath following complaints of abdominal pain and an episode of vomitting.  PCCM consulted post intubation.  SIGNIFICANT EVENTS  Intubated RIJ CVC placed   STUDIES:  CXR post intubation CTH w/o contrast   HISTORY OF PRESENT ILLNESS:  ( events stated here are per the patient's daughter and son's account and per information gathered from the medical record) 77 yr old female with PMHx CAD, NICMP EF 15-20%, s/p AICD, NIDDM, presents from home for altered mental status and shortness of breath following complaints of abdominal pain and an episode of vomitting.  Per her daughter the patient at baseline walks around with a cane and does not require assistance to the bathroom. She spends most of her day in a seated position. Within the last few weeks she has shown a steady decline from her baseline. Daughter states that a few weeks ago she had significant diarrhea. She denies melena. States that it was so profuse patient required a bath and a bed change. It eventually subsided ( family believes it stopped due to Pepto bismol). Pt also was becoming more confused. Saying things that were difficult to understand. She has had these episodes in the past but it has become more frequent. More recently in the last 3 days her appetite decreased and within the last day patient complained of abdominal pain (which family states is a chronic complaint) however, it was accompanied by her complaining of feeling hot followed by a significant episode of non bilious non bloody vomiting which prompted them to come to the hospital. On  presentation according to EDP pt was " she was in respiratory distress requiring a nonrebreather but oxygenating in the mid 80s.  The patient was unable to give much in the way of information secondary to her severe distress"  She was intubated by ED. PCCM was then consulted for admission   PAST MEDICAL HISTORY :   has a past medical history of AICD (automatic cardioverter/defibrillator) present, Arthritis, Atrial flutter ([place]), Cardiomyopathy, nonischemic ([place]), Carotid artery disease ([place]), Chronic systolic CHF (congestive heart failure) ([place]), Diabetes mellitus, type II ([place]), Gastroesophageal reflux disease, Hyperlipidemia, Hypertension, Left bundle branch block, Mitral regurgitation, Pericardial effusion, Stroke (cerebrum) ([place]) (09/2016), and Ventricular tachycardia ([place]).  has a past surgical history that includes Colonoscopy (2008); Bi-ventricular implantable cardioverter defibrillator  (crt-d) (11/18/2014); Tonsillectomy; Hernia repair (Right); Abdominal hysterectomy (2006); and Cardiac catheterization (N/A, 11/18/2014). Prior to Admission medications   Medication Sig Start Date End Date Taking? Authorizing Provider  amiodarone (PACERONE) 200 MG tablet Take 100 mg by mouth daily at 12 noon.   Yes [provider]  aspirin EC 325 MG EC tablet Take 1 tablet (325 mg total) by mouth daily. 09/25/16  Yes Sinda Du, MD  atorvastatin (LIPITOR) 20 MG tablet Take 1 tablet (20 mg total) by mouth daily at 6 PM. 07/24/14  Yes Sinda Du, MD  carvedilol (COREG) 3.125 MG tablet Take 3.125 mg by mouth 2 (two) times daily with a meal.   Yes [provider]  ferrous sulfate 325 (65 FE) MG tablet Take 325 mg by mouth daily with breakfast.  Yes [provider]  insulin detemir (LEVEMIR) 100 UNIT/ML injection Inject 15 Units into the skin at bedtime.    Yes Sinda Du, MD  levothyroxine (SYNTHROID) 100 MCG tablet Take 1 tablet (100 mcg total) by mouth daily. Patient taking  differently: Take 75 mcg by mouth daily before breakfast.  05/19/17 05/19/18 Yes Lendon Colonel, NP  losartan (COZAAR) 25 MG tablet Take 0.5 tablets (12.5 mg total) by mouth daily. Patient taking differently: Take 25 mg by mouth daily.  06/29/16 08/31/17 Yes Imogene Burn, PA-C  Omega-3 Fatty Acids (FISH OIL PO) Take 1,000 mg by mouth 3 (three) times daily.    Yes [provider]  omeprazole (PRILOSEC) 20 MG capsule Take 20 mg by mouth 2 (two) times daily before a meal.   Yes [provider]  potassium chloride SA (KLOR-CON M20) 20 MEQ tablet Take 2 tablets in the am , and 2 tablets in the pm Patient taking differently: Take 20-40 mEq by mouth See admin instructions. 40 mEq in the morning and 20 mEq in the evening 03/31/17  Yes Dunn, Dayna N, PA-C  sitaGLIPtin (JANUVIA) 100 MG tablet Take 100 mg by mouth daily.   Yes [provider]  torsemide (DEMADEX) 20 MG tablet Take 2 Tablets (40 mg) In the AM And and Take 1 Tablet ( 20 mg) In the Evening. Patient taking differently: Take 10-20 mg by mouth See admin instructions. 59m in the morning and if swelling take an additional 120min the afternoon 03/28/17  Yes Dunn, Dayna N, PA-C  UNIFINE PENTIPS 32G X 4 MM MISC USE TWICE A DAY TO TAKE INSULIN. 01/06/16   NiCassandria AngerMD   Allergies  Allergen Reactions  . Codeine Palpitations  . Decongestant [Pseudoephedrine Hcl Er] Palpitations    FAMILY HISTORY:  family history includes Diabetes in her mother and sister; Heart attack in her brother; Heart failure in her mother; Hypertension in her father; Kidney disease in her mother; Seizures in her brother and sister; Stomach cancer in her father. SOCIAL HISTORY:  reports that  has never smoked. she has never used smokeless tobacco. She reports that she does not drink alcohol or use drugs.  REVIEW OF SYSTEMS:  Unable to obtain due to metabolic encephalopathy, and pt is intubated on sedation Constitutional: Negative  for fever, chills, weight loss, malaise/fatigue and diaphoresis.  HENT: Negative for hearing loss, ear pain, nosebleeds, congestion, sore throat, neck pain, tinnitus and ear discharge.   Eyes: Negative for blurred vision, double vision, photophobia, pain, discharge and redness.  Respiratory: Negative for cough, hemoptysis, sputum production, shortness of breath, wheezing and stridor.   Cardiovascular: Negative for chest pain, palpitations, orthopnea, claudication, leg swelling and PND.  Gastrointestinal: Negative for heartburn, nausea, vomiting, abdominal pain, diarrhea, constipation, blood in stool and melena.  Genitourinary: Negative for dysuria, urgency, frequency, hematuria and flank pain.  Musculoskeletal: Negative for myalgias, back pain, joint pain and falls.  Skin: Negative for itching and rash.  Neurological: Negative for dizziness, tingling, tremors, sensory change, speech change, focal weakness, seizures, loss of consciousness, weakness and headaches.  Endo/Heme/Allergies: Negative for environmental allergies and polydipsia. Does not bruise/bleed easily.  SUBJECTIVE:   VITAL SIGNS: Pulse Rate:  [52-123] 98 (01/01 2145) Resp:  [12-24] 24 (01/01 2145) BP: (76-136)/(47-88) 124/65 (01/01 2145) SpO2:  [72 %-100 %] 99 % (01/01 2145) FiO2 (%):  [100 %] 100 % (01/01 2000) Weight:  [45.4 kg (100 lb)] 45.4 kg (100 lb) (01/01 1925)  PHYSICAL EXAMINATION: General:  Minimally responsive on propofol sedation Neuro:  Pupils fixed and dilated, deep Gag present. On propofol HEENT:  Normocephalic, ETT and OGT noted in oropharynx  Cardiovascular:  S1 and S2 noted + heave + gallop Lungs: bilateral crackles on posterior auscultation Abdomen:  Soft non distended abdomen + BS Musculoskeletal:  Left ankle significantly swollen in comparison to right ankle both lower ext have +2 pitting edema to midshaft however left leg appears bigger than right, no erythema or temp difference appreciated Skin:   Grossly intact  Recent Labs  Lab 06/06/17 1935 06/06/17 2045  NA 135 135  K 6.1* 7.0*  CL 109 107  CO2  --  15*  BUN 28* 17  CREATININE 0.80 0.91  GLUCOSE 152* 138*   Recent Labs  Lab 06/06/17 1928 06/06/17 1935  HGB 13.6 14.6  HCT 42.7 43.0  WBC 8.4  --   PLT 137*  --    Dg Chest Portable 1 View  Result Date: 06/06/2017 CLINICAL DATA:  Central line placement EXAM: PORTABLE CHEST 1 VIEW COMPARISON:  June 06, 2017 7:49 p.m. FINDINGS: Right jugular central venous line is identified with distal tip in the superior vena cava. There is no pneumothorax. Nasogastric tube is identified with distal tip in the stomach. Endotracheal tube is identified with distal tip 2 cm from carina, retraction by 3 cm is recommended. There is pulmonary edema. Minimal bilateral pleural effusions are noted. Consolidation of the right upper and mid lung and left lung base are noted. There is no pneumothorax. The heart size is enlarged. Cardiac pacemaker is unchanged. IMPRESSION: Right jugular central venous line with distal tip in the superior vena cava. There is no pneumothorax. Endotracheal tube is identified distal tip 2 cm from carina, retraction by 3 cm is recommended. Electronically Signed   By: Abelardo Diesel M.D.   On: 06/06/2017 20:37   Dg Chest Portable 1 View  Result Date: 06/06/2017 CLINICAL DATA:  Encounter for OG tube placement EXAM: PORTABLE CHEST 1 VIEW COMPARISON:  06/06/2017 FINDINGS: There is a left chest wall ICD with leads in the right atrial appendage, coronary sinus and right ventricle. ET tube tip is above the carina. There is a enteric tube with side port below the GE junction. Cardiac enlargement and aortic atherosclerosis noted. Bilateral pleural effusions noted left greater than right. Pulmonary edema pattern is similar to previous exam. The right upper lobe airspace opacity is also unchanged. IMPRESSION: 1. ETT tip is just above the carina. The side port for the enteric tube is below the  GE junction. 2. Cardiac enlargement and Aortic Atherosclerosis (ICD10-I70.0). 3. Congestive heart failure. 4. No change in aeration to the right upper lobe compared with previous exam Electronically Signed   By: Kerby Moors M.D.   On: 06/06/2017 20:12   Dg Chest Portable 1 View  Result Date: 06/06/2017 CLINICAL DATA:  Chest pain and respiratory failure. EXAM: PORTABLE CHEST 1 VIEW COMPARISON:  03/03/2017 FINDINGS: Pacer/AICD device. Other leads and wires project over the chest. Midline trachea. Cardiomegaly accentuated by AP portable technique. Left pleural space not well evaluated. No right-sided pleural effusion or pneumothorax. Right greater than left interstitial and airspace disease. IMPRESSION: Bilateral interstitial and airspace disease, at least partially felt to represent pulmonary edema. Infection, especially within the right upper lobe, cannot be excluded. Suboptimal evaluation of the inferior left hemithorax secondary to overlying pacer battery. Electronically Signed   By: Abigail Miyamoto M.D.   On: 06/06/2017 19:46    ASSESSMENT / PLAN: NEURO: Altered  mental status Acute metabolic encephalopathy CTH w/o contrast was done on 03/03/17 showed a subdural hygroma slightly larger than in prior studies and with increased mass effect on gyri. Given change in mental status will send for [facility] w/o contrast once stable On Sedation RASS goal 0 Recommend sedation vacation to assess neuro exam periodically  CARDIAC: Acute Exacerbation of Congestive heart failure BNP >2000 Cardiogenic Shock RIJ CVC in place Trend CVP MAP goal> 65 mmHg Continue on Levophed ggt  When hemodynamics improve patient will require some diuresis. Lactic acid 3.82 secondary to hypoperfusion-> trend LA  PULMONARY: Acute hypoxic respiratory failure secondary to cardiogenic pulmonary edema Pt intubated on PRVC initial ABG showed metabolic acidosis MVE increased to compensate TV 8cc/kg CXR shows "batwing" suggestive of  pulmonary edema vs aspiration VAP precautions Wean per daily resp protocol   ID: No signs of Sepsis at this time Elevated LA and Hypotension can be explained by Cardiac compromise Pt remains afebrile, no leukocytosis Given the history of significant emesis, "bat wing" on CXR and now intubated continue empiric antibiotics to cover for aspiration PCT baseline sent - will use this to guide need for abx  Endocrine: NIDDM  Check Hgb A1c ( last check 6.4) ICU Phase 1 Glycemic protocol with SSI Will check TSH- pt6 has a h/o hypothyroidism last T4 1.46 and TSH was 19.7 Will check Cortisol level  GI: Transaminases elevated Alk phos normal and Bili not elevated - unlikely any calculi or obstructive process in biliary tree.  No h/o ETOH per family Takes Aspirin 81 mg daily. Continue to trend AST/ALT Continue NPO OGT in place was initially to low suction- will switch to gravity PPI and Zofran ordered  Heme: No signs of active bleeding  If Hgb< 8 transfuse PRBCs given significant Cardiac history Type & screen on admission DVT PPx-> with Heparin [place] and SCDs  Concern for the left leg disproportionate size in comparison to left and pt is minimally mobile at home. Lower ext dopplers pending to r/o DVT  RENAL Baseline Cr noted no evidence of AKI EKG changes noted and potassium initially hemolyzed and rpt 7 Pt received medical cocktail ( insulin, D50, calcium IV) She did receive a neuromuscular blockade for intubation Repeat potassium pending.  .. Lab Results  Component Value Date   CREATININE 0.91 06/06/2017   CREATININE 0.80 06/06/2017   CREATININE 0.73 05/19/2017   Anion Gap metabolic Acidosis secondary to Lactic Acidosis Trend LA for clearance   I, Dr Seward Carol have personally reviewed patient's available data, including medical history, events of note, physical examination and test results as part of my evaluation. I have discussed with NP and [place] along with  other care providers such as pharmacist, RN and RRT. The patient is critically ill with multiple organ systems failure and requires high complexity decision making for assessment and support, frequent evaluation and titration of therapies, application of advanced monitoring technologies and extensive interpretation of multiple databases. Critical Care Time devoted to patient care services described in this note is 29 Minutes. This time reflects time of care of this signee Dr Seward Carol. This critical care time does not reflect procedure time, or teaching time or supervisory time but could involve care discussion time    DISPOSITION: ICU CC TIME: 55 mins PROGNOSIS: Guarded FAMILY: Son and saughter at bedside, patient lives with nephew but they take turns coming over and caring for her. At baseline patient is able to ambulate with a cane but has become more weak and  inactive in the last few weeks. After discussion of clinical condition and possible outcvomes they state that if her condition is irreversible she would not want to be sustained by life supporting measures. Also in the event her heart should stop she would not want CPR. Pt's code status was updated at the request of family. CODE STATUS: DNR   Signed Dr Seward Carol Pulmonary and Critical Care Medicine   06/06/2017, 9:51 PM

## 2017-06-07 ENCOUNTER — Inpatient Hospital Stay (HOSPITAL_COMMUNITY): Payer: Medicare HMO

## 2017-06-07 ENCOUNTER — Other Ambulatory Visit: Payer: Self-pay

## 2017-06-07 ENCOUNTER — Ambulatory Visit: Payer: Medicare HMO | Admitting: Cardiology

## 2017-06-07 DIAGNOSIS — J9601 Acute respiratory failure with hypoxia: Secondary | ICD-10-CM

## 2017-06-07 DIAGNOSIS — I361 Nonrheumatic tricuspid (valve) insufficiency: Secondary | ICD-10-CM

## 2017-06-07 LAB — BLOOD GAS, ARTERIAL
ACID-BASE EXCESS: 0.8 mmol/L (ref 0.0–2.0)
Bicarbonate: 23.1 mmol/L (ref 20.0–28.0)
DRAWN BY: 347621
FIO2: 40
MECHVT: 400 mL
O2 Saturation: 99.3 %
PATIENT TEMPERATURE: 98.2
PCO2 ART: 26.1 mmHg — AB (ref 32.0–48.0)
PEEP/CPAP: 5 cmH2O
PO2 ART: 138 mmHg — AB (ref 83.0–108.0)
RATE: 24 resp/min
pH, Arterial: 7.555 — ABNORMAL HIGH (ref 7.350–7.450)

## 2017-06-07 LAB — PROCALCITONIN: Procalcitonin: 0.3 ng/mL

## 2017-06-07 LAB — BASIC METABOLIC PANEL
Anion gap: 8 (ref 5–15)
BUN: 16 mg/dL (ref 6–20)
CALCIUM: 9 mg/dL (ref 8.9–10.3)
CHLORIDE: 107 mmol/L (ref 101–111)
CO2: 23 mmol/L (ref 22–32)
CREATININE: 0.72 mg/dL (ref 0.44–1.00)
GFR calc Af Amer: >60 mL/min (ref >60)
GFR calc non Af Amer: >60 mL/min (ref >60)
GLUCOSE: 83 mg/dL (ref 65–99)
Potassium: 3.7 mmol/L (ref 3.5–5.1)
Sodium: 138 mmol/L (ref 135–145)

## 2017-06-07 LAB — GLUCOSE, CAPILLARY
GLUCOSE-CAPILLARY: 102 mg/dL — AB (ref 65–99)
GLUCOSE-CAPILLARY: 72 mg/dL (ref 65–99)
GLUCOSE-CAPILLARY: 78 mg/dL (ref 65–99)
Glucose-Capillary: 189 mg/dL — ABNORMAL HIGH (ref 65–99)
Glucose-Capillary: 89 mg/dL (ref 65–99)
Glucose-Capillary: 111 mg/dL — ABNORMAL HIGH (ref 65–99)

## 2017-06-07 LAB — MRSA PCR SCREENING: MRSA by PCR: NEGATIVE

## 2017-06-07 LAB — CBC
HEMATOCRIT: 38 % (ref 36.0–46.0)
Hemoglobin: 12.7 g/dL (ref 12.0–15.0)
MCH: 33.2 pg (ref 26.0–34.0)
MCHC: 33.4 g/dL (ref 30.0–36.0)
MCV: 99.2 fL (ref 78.0–100.0)
Platelets: 121 10*3/uL — ABNORMAL LOW (ref 150–400)
RBC: 3.83 MIL/uL — ABNORMAL LOW (ref 3.87–5.11)
RDW: 16.4 % — AB (ref 11.5–15.5)
WBC: 12.4 10*3/uL — ABNORMAL HIGH (ref 4.0–10.5)

## 2017-06-07 LAB — VANCOMYCIN, RANDOM: VANCOMYCIN RM: 10

## 2017-06-07 LAB — TYPE AND SCREEN
ABO/RH(D): O POS
ANTIBODY SCREEN: NEGATIVE

## 2017-06-07 LAB — MAGNESIUM
MAGNESIUM: 2 mg/dL (ref 1.7–2.4)
Magnesium: 1.9 mg/dL (ref 1.7–2.4)

## 2017-06-07 LAB — PHOSPHORUS
Phosphorus: 3.4 mg/dL (ref 2.5–4.6)
Phosphorus: 3.5 mg/dL (ref 2.5–4.6)

## 2017-06-07 LAB — HEMOGLOBIN A1C
Hgb A1c MFr Bld: 6.2 % — ABNORMAL HIGH (ref 4.8–5.6)
MEAN PLASMA GLUCOSE: 131.24 mg/dL

## 2017-06-07 LAB — CORTISOL: Cortisol, Plasma: 27.6 ug/dL

## 2017-06-07 LAB — T4, FREE: Free T4: 1.57 ng/dL — ABNORMAL HIGH (ref 0.61–1.12)

## 2017-06-07 LAB — ABO/RH: ABO/RH(D): O POS

## 2017-06-07 LAB — ECHOCARDIOGRAM COMPLETE
Height: 61 in
Weight: 1703.71 oz

## 2017-06-07 LAB — TRIGLYCERIDES: Triglycerides: 55 mg/dL (ref <150)

## 2017-06-07 LAB — TSH: TSH: 12.424 u[IU]/mL — ABNORMAL HIGH (ref 0.350–4.500)

## 2017-06-07 MED ORDER — PRO-STAT SUGAR FREE PO LIQD: 30.0000 mL | Freq: Two times a day (BID) | ORAL | Status: DC

## 2017-06-07 MED ORDER — DEXTROSE 5 % IV SOLN: 0.0000 ug/min | INTRAVENOUS | Status: DC

## 2017-06-07 MED ORDER — VITAL HIGH PROTEIN PO LIQD
1000.0000 mL | ORAL | Status: DC
  Administered 2017-06-07 – 2017-06-08 (×2): 1000 mL

## 2017-06-07 MED ORDER — LEVOTHYROXINE SODIUM 75 MCG PO TABS
75.0000 ug | ORAL_TABLET | Freq: Every day | ORAL | Status: DC
  Administered 2017-06-08 – 2017-06-13 (×3): 75 ug
  Filled 2017-06-07 (×4): qty 1

## 2017-06-07 MED ORDER — SODIUM CHLORIDE 0.9% FLUSH: 10.0000 mL | INTRAVENOUS | Status: DC | PRN

## 2017-06-07 MED ORDER — LEVOTHYROXINE SODIUM 75 MCG PO TABS: 75.0000 ug | ORAL_TABLET | Freq: Every day | ORAL | Status: DC

## 2017-06-07 MED ORDER — SODIUM CHLORIDE 0.9% FLUSH
10.0000 mL | Freq: Two times a day (BID) | INTRAVENOUS | Status: DC
  Administered 2017-06-07 – 2017-06-09 (×3): 10 mL

## 2017-06-07 MED ORDER — VITAL HIGH PROTEIN PO LIQD: 1000.0000 mL | ORAL | Status: DC

## 2017-06-07 MED ORDER — CHLORHEXIDINE GLUCONATE CLOTH 2 % EX PADS
6.0000 | MEDICATED_PAD | Freq: Every day | CUTANEOUS | Status: DC
  Administered 2017-06-07 – 2017-06-09 (×3): 6 via TOPICAL

## 2017-06-07 NOTE — Progress Notes (Signed)
.. ..  Name: Donna [name] MRN: [id number] DOB: 04-19-1941    ADMISSION DATE:  06/06/2017 CONSULTATION DATE:  06/06/17  REFERRING MD :  Sabra Heck MD  CHIEF COMPLAINT:  Alt mental status, SOB, Weakness, abdominal pain, s/p vomitting  BRIEF PATIENT DESCRIPTION: 77 yr old female with PMHx CAD, NICMP EF 15-20%, s/p AICD, NIDDM, presents from home for altered mental status and shortness of breath following complaints of abdominal pain and an episode of vomitting.  PCCM consulted post intubation.  SIGNIFICANT EVENTS  Intubated RIJ CVC placed   STUDIES:  CXR post intubation CTH w/o contrast   HISTORY OF PRESENT ILLNESS:  ( events stated here are per the patient's daughter and son's account and per information gathered from the medical record) 77 yr old female with PMHx CAD, NICMP EF 15-20%, s/p AICD, NIDDM, presents from home for altered mental status and shortness of breath following complaints of abdominal pain and an episode of vomitting.  Per her daughter the patient at baseline walks around with a cane and does not require assistance to the bathroom. She spends most of her day in a seated position. Within the last few weeks she has shown a steady decline from her baseline. Daughter states that a few weeks ago she had significant diarrhea. She denies melena. States that it was so profuse patient required a bath and a bed change. It eventually subsided ( family believes it stopped due to Pepto bismol). Pt also was becoming more confused. Saying things that were difficult to understand. She has had these episodes in the past but it has become more frequent. More recently in the last 3 days her appetite decreased and within the last day patient complained of abdominal pain (which family states is a chronic complaint) however, it was accompanied by her complaining of feeling hot followed by a significant episode of non bilious non bloody vomiting which prompted them to come to the hospital. On  presentation according to EDP pt was " she was in respiratory distress requiring a nonrebreather but oxygenating in the mid 80s.  The patient was unable to give much in the way of information secondary to her severe distress"  She was intubated by ED. PCCM was then consulted for admission   SUBJECTIVE:  Sedated and intubated  VITAL SIGNS: Temp:  [96.6 F (35.9 C)-99 F (37.2 C)] 99 F (37.2 C) (01/02 1000) Pulse Rate:  [52-123] 60 (01/02 1129) Resp:  [12-24] 24 (01/02 1000) BP: (76-148)/(47-88) 124/76 (01/02 1129) SpO2:  [72 %-100 %] 100 % (01/02 1000) FiO2 (%):  [40 %-100 %] 40 % (01/02 1129) Weight:  [45.4 kg (100 lb)-48.3 kg (106 lb 7.7 oz)] 48.3 kg (106 lb 7.7 oz) (01/02 0900)  PHYSICAL EXAMINATION: General:  Minimally responsive on propofol sedation Neuro:  Pupils fixed and dilated, deep Gag present. On propofol HEENT:  Normocephalic, ETT and OGT noted in oropharynx  Cardiovascular:  S1 and S2 noted + heave + gallop Lungs: bilateral crackles on posterior auscultation Abdomen:  Soft non distended abdomen + BS Musculoskeletal:  Left ankle significantly swollen in comparison to right ankle both lower ext have +2 pitting edema to midshaft however left leg appears bigger than right, no erythema or temp difference appreciated Skin:  Grossly intact  Recent Labs  Lab 06/06/17 1935 06/06/17 2045 06/07/17 0350  NA 135 135 138  K 6.1* 7.0* 3.7  CL 109 107 107  CO2  --  15* 23  BUN 28* 17 16  CREATININE 0.80 0.91  0.72  GLUCOSE 152* 138* 83   Recent Labs  Lab 06/06/17 1928 06/06/17 1935 06/07/17 0350  HGB 13.6 14.6 12.7  HCT 42.7 43.0 38.0  WBC 8.4  --  12.4*  PLT 137*  --  121*   Ct Head Wo Contrast  Result Date: 06/07/2017 CLINICAL DATA:  Altered level of consciousness. History of stroke, hypertension, diabetes, atrial fibrillation. EXAM: CT HEAD WITHOUT CONTRAST TECHNIQUE: Contiguous axial images were obtained from the base of the skull through the vertex without  intravenous contrast. COMPARISON:  03/03/2017 FINDINGS: Brain: Chronic appearing bilateral subdural fluid collections without change since previous study. Mild cerebral atrophy. No ventricular dilatation. Patchy low-attenuation changes in the deep white matter consistent with small vessel ischemia. No mass-effect or midline shift. No abnormal extra-axial fluid collections. Gray-white matter junctions are distinct. Basal cisterns are not effaced. No acute intracranial hemorrhage. Extra-axial calcification along the left posterior parietal dural surface measuring 9 mm in diameter, likely representing a calcified meningioma. No change since prior study. Vascular: Internal carotid artery vascular calcifications are present. Skull: The calvarium appears intact. Sinuses/Orbits: Paranasal sinuses and mastoid air cells are not opacified. Other: None. IMPRESSION: No acute intracranial abnormalities. Chronic bilateral subdural hygromas without change since prior study. Left posterior parietal calcified meningioma also without change. Chronic atrophy and small vessel ischemic changes. Electronically Signed   By: Lucienne Capers M.D.   On: 06/07/2017 01:34   Dg Chest Portable 1 View  Result Date: 06/06/2017 CLINICAL DATA:  Central line placement EXAM: PORTABLE CHEST 1 VIEW COMPARISON:  June 06, 2017 7:49 p.m. FINDINGS: Right jugular central venous line is identified with distal tip in the superior vena cava. There is no pneumothorax. Nasogastric tube is identified with distal tip in the stomach. Endotracheal tube is identified with distal tip 2 cm from carina, retraction by 3 cm is recommended. There is pulmonary edema. Minimal bilateral pleural effusions are noted. Consolidation of the right upper and mid lung and left lung base are noted. There is no pneumothorax. The heart size is enlarged. Cardiac pacemaker is unchanged. IMPRESSION: Right jugular central venous line with distal tip in the superior vena cava. There is  no pneumothorax. Endotracheal tube is identified distal tip 2 cm from carina, retraction by 3 cm is recommended. Electronically Signed   By: Abelardo Diesel M.D.   On: 06/06/2017 20:37   Dg Chest Portable 1 View  Result Date: 06/06/2017 CLINICAL DATA:  Encounter for OG tube placement EXAM: PORTABLE CHEST 1 VIEW COMPARISON:  06/06/2017 FINDINGS: There is a left chest wall ICD with leads in the right atrial appendage, coronary sinus and right ventricle. ET tube tip is above the carina. There is a enteric tube with side port below the GE junction. Cardiac enlargement and aortic atherosclerosis noted. Bilateral pleural effusions noted left greater than right. Pulmonary edema pattern is similar to previous exam. The right upper lobe airspace opacity is also unchanged. IMPRESSION: 1. ETT tip is just above the carina. The side port for the enteric tube is below the GE junction. 2. Cardiac enlargement and Aortic Atherosclerosis (ICD10-I70.0). 3. Congestive heart failure. 4. No change in aeration to the right upper lobe compared with previous exam Electronically Signed   By: Kerby Moors M.D.   On: 06/06/2017 20:12   Dg Chest Portable 1 View  Result Date: 06/06/2017 CLINICAL DATA:  Chest pain and respiratory failure. EXAM: PORTABLE CHEST 1 VIEW COMPARISON:  03/03/2017 FINDINGS: Pacer/AICD device. Other leads and wires project over the chest. Midline trachea.  Cardiomegaly accentuated by AP portable technique. Left pleural space not well evaluated. No right-sided pleural effusion or pneumothorax. Right greater than left interstitial and airspace disease. IMPRESSION: Bilateral interstitial and airspace disease, at least partially felt to represent pulmonary edema. Infection, especially within the right upper lobe, cannot be excluded. Suboptimal evaluation of the inferior left hemithorax secondary to overlying pacer battery. Electronically Signed   By: Abigail Miyamoto M.D.   On: 06/06/2017 19:46    ASSESSMENT /  PLAN: NEURO: Altered mental status Acute metabolic encephalopathy CTH w/o contrast was done on 03/03/17 showed a subdural hygroma slightly larger than in prior studies and with increased mass effect on gyri. Repeat head Ct scan when otherwise stabilized On Sedation RASS goal 0 to -1 Lighten sedation 1/2   CARDIAC: Acute Exacerbation of Congestive heart failure BNP >2000 Cardiogenic Shock Follow CVP Diurese as able depending on renal fxn, BP MAP goal> 65 mmHg Continue on Levophed ggt, wean  When hemodynamics improve patient will require some diuresis. Follow lactate for clearance  PULMONARY: Acute hypoxic respiratory failure secondary to cardiogenic pulmonary edema Continue PRVC, 8cc/kg Empiric abx for possible PNA > doubt based on clinical scenerio VAP precautions Wean per daily resp protocol   ID: No signs of Sepsis at this time Elevated LA and Hypotension can be explained by Cardiac compromise Pt remains afebrile, no leukocytosis Given the history of significant emesis, "bat wing" on CXR and now intubated continue empiric antibiotics to cover for aspiration PCT baseline sent - will use this to guide need for abx  Endocrine: NIDDM  Check Hgb A1c ( last check 6.4) ICU Phase 1 Glycemic protocol with SSI Will check TSH- pt6 has a h/o hypothyroidism last T4 1.46 and TSH was 19.7 Will check Cortisol level  GI: Transaminases elevated Alk phos normal and Bili not elevated - unlikely any calculi or obstructive process in biliary tree.  No h/o ETOH per family Takes Aspirin 81 mg daily. Continue to trend AST/ALT OGT to suction defer TF for now PPI and Zofran ordered  Heme: No signs of active bleeding  If Hgb< 8 transfuse PRBCs given significant Cardiac history Type & screen on admission DVT PPx-> with Heparin [place] and SCDs  Concern for the left leg disproportionate size in comparison to left and pt is minimally mobile at home. Lower ext dopplers pending to r/o  DVT  RENAL Baseline Cr noted no evidence of AKI Hyperkalemia, probably hemolyzed .Follow BMP   Anion Gap metabolic Acidosis secondary to Lactic Acidosis Trend LA for clearance   DISPOSITION: ICU CC TIME: 55 mins PROGNOSIS: Guarded FAMILY: Dr Gilford Raid spoke w Son and saughter at bedside, patient lives with nephew but they take turns coming over and caring for her. At baseline patient is able to ambulate with a cane but has become more weak and inactive in the last few weeks. After discussion of clinical condition and possible outcvomes they state that if her condition is irreversible she would not want to be sustained by life supporting measures. Also in the event her heart should stop she would not want CPR. Pt's code status was updated at the request of family.  Dr Lamonte Sakai confirmed these wishes w family at bedside 1/2  CODE STATUS: DNR   Independent CC time 34 minutes  Baltazar Apo, MD, PhD 06/07/2017, 11:43 AM [facility] [phone] or if no answer [phone]

## 2017-06-07 NOTE — Progress Notes (Signed)
  Echocardiogram 2D Echocardiogram has been performed.  Donna [name] T Arleta Ostrum 06/07/2017, 3:42 PM

## 2017-06-07 NOTE — Progress Notes (Signed)
Initial Nutrition Assessment  DOCUMENTATION CODES:   Not applicable  INTERVENTION:   Tube Feeding:  Vital High Protein @ 45 ml/hr Provides 1080 kcals, 95 g of protein and 907 mL of free water Meets 93% of calorie needs, 100% protein needs.  TF regimen and propofol at current rate providing 1223 total kcal/day (105 % of kcal needs)  NUTRITION DIAGNOSIS:   Inadequate oral intake related to acute illness as evidenced by NPO status.  GOAL:   Provide needs based on ASPEN/SCCM guidelines  MONITOR:   Labs, Weight trends, Vent status, TF tolerance  REASON FOR ASSESSMENT:   Ventilator    ASSESSMENT:   77 yo female admitted with AMS and SOB following complaints of abdominal pain and vomiting, respiratory failure with pulmonary edema vs aspiration. Pt with hx of CAD, CM EF EF 15-20%, DM  Patient is currently intubated on ventilator support MV: 9.4 L/min Temp (24hrs), Avg:98.2 F (36.8 C), Min:96.6 F (35.9 C), Max:99.1 F (37.3 C)  Propofol: 5.4 ml/hr  TF ordered today by MD via Adult Tube Feeding Protocol; TF not yet started OG tube in place  Labs: reviewed Meds: reviewed  NUTRITION - FOCUSED PHYSICAL EXAM:  Unable to assess  Diet Order:  Diet NPO time specified  EDUCATION NEEDS:   Not appropriate for education at this time  Skin:  Skin Assessment: Reviewed RN Assessment  Last BM:  no documented BM  Height:   Ht Readings from Last 1 Encounters:  06/07/17 5\' 1"  (1.549 m)    Weight:   Wt Readings from Last 1 Encounters:  06/07/17 106 lb 7.7 oz (48.3 kg)    Ideal Body Weight:     BMI:  Body mass index is 20.12 kg/m.  Estimated Nutritional Needs:   Kcal:  1166 kcals  Protein:  72-97 g  Fluid:  >/= 1.4 L   Kerman Passey MS, RD, LDN, CNSC [phone] Pager  [phone] Weekend/On-Call Pager

## 2017-06-07 NOTE — Progress Notes (Signed)
Pt placed back on full vent support due to low RR & MVe.

## 2017-06-07 NOTE — Progress Notes (Signed)
Patient transported toe CT and back to 2H13 on ventilator with no complications.

## 2017-06-07 NOTE — Progress Notes (Signed)
..  Name: Donna [name] MRN: [id number] DOB: 1941-05-01    ADMISSION DATE:  06/06/2017 CONSULTATION DATE:  06/06/17  REFERRING MD :  Sabra Heck MD  CHIEF COMPLAINT:  Alt mental status, SOB, Weakness, abdominal pain, s/p vomitting  BRIEF PATIENT DESCRIPTION: 77 yr old female with PMHx CAD, NICMP EF 15-20%, s/p AICD, NIDDM, presented 1/1 from home for altered mental status and shortness of breath following complaints of abdominal pain and an episode of vomitting. PCCM consulted post intubation.  Baseline walks around with a cane and does not require assistance to the bathroom. She spends most of her day in a seated position. Within the last few weeks she has shown a steady decline from her baseline. Daughter states that a few weeks ago she had significant diarrhea. States that it was so profuse patient required a bath and a bed change. It eventually subsided (family believes it stopped due to Pepto bismol). Pt also was becoming more confused. Saying things that were difficult to understand. She has had these episodes in the past but it has become more frequent. More recently in the last 3 days her appetite decreased and within the last day patient complained of abdominal pain (which family states is a chronic complaint) however, it was accompanied by her complaining of feeling hot followed by a significant episode of non bilious non bloody vomiting which prompted them to come to the hospital. On presentation according to EDP "she was in respiratory distress requiring a nonrebreather but oxygenating in the mid 80s.  The patient was unable to give much in the way of information secondary to her severe distress".   She was intubated by ED. PCCM was then consulted for admission   SUBJECTIVE:  Pt remains on 50 mcg fentanyl, 20 mcg propofol.  WUA not completed yet this am. Tmax 99.    VITAL SIGNS: Temp:  [96.6 F (35.9 C)-99 F (37.2 C)] 99 F (37.2 C) (01/02 1000) Pulse Rate:  [52-123] 60 (01/02  1129) Resp:  [12-24] 24 (01/02 1000) BP: (76-148)/(47-88) 124/76 (01/02 1129) SpO2:  [72 %-100 %] 100 % (01/02 1000) FiO2 (%):  [40 %-100 %] 40 % (01/02 1129) Weight:  [100 lb (45.4 kg)-106 lb 7.7 oz (48.3 kg)] 106 lb 7.7 oz (48.3 kg) (01/02 0900)  PHYSICAL EXAMINATION: General: frail elderly female on vent, NAD HEENT: MM pink/moist, ETT  Neuro: sedate, barely opens eyes to physical stimulation  CV: s1s2 rrr, no m/r/g PULM: even/non-labored, lungs bilaterally with bibasilar crackles  IE:PPIR, non-tender, bsx4 active  Extremities: warm/dry, 2+ BLE pitting edema  Skin: scattered bruising on upper extremities  Recent Labs  Lab 06/06/17 1935 06/06/17 2045 06/07/17 0350  NA 135 135 138  K 6.1* 7.0* 3.7  CL 109 107 107  CO2  --  15* 23  BUN 28* 17 16  CREATININE 0.80 0.91 0.72  GLUCOSE 152* 138* 83   Recent Labs  Lab 06/06/17 1928 06/06/17 1935 06/07/17 0350  HGB 13.6 14.6 12.7  HCT 42.7 43.0 38.0  WBC 8.4  --  12.4*  PLT 137*  --  121*   Ct Head Wo Contrast  Result Date: 06/07/2017 CLINICAL DATA:  Altered level of consciousness. History of stroke, hypertension, diabetes, atrial fibrillation. EXAM: CT HEAD WITHOUT CONTRAST TECHNIQUE: Contiguous axial images were obtained from the base of the skull through the vertex without intravenous contrast. COMPARISON:  03/03/2017 FINDINGS: Brain: Chronic appearing bilateral subdural fluid collections without change since previous study. Mild cerebral atrophy. No ventricular dilatation. Patchy  low-attenuation changes in the deep white matter consistent with small vessel ischemia. No mass-effect or midline shift. No abnormal extra-axial fluid collections. Gray-white matter junctions are distinct. Basal cisterns are not effaced. No acute intracranial hemorrhage. Extra-axial calcification along the left posterior parietal dural surface measuring 9 mm in diameter, likely representing a calcified meningioma. No change since prior study. Vascular:  Internal carotid artery vascular calcifications are present. Skull: The calvarium appears intact. Sinuses/Orbits: Paranasal sinuses and mastoid air cells are not opacified. Other: None. IMPRESSION: No acute intracranial abnormalities. Chronic bilateral subdural hygromas without change since prior study. Left posterior parietal calcified meningioma also without change. Chronic atrophy and small vessel ischemic changes. Electronically Signed   By: Lucienne Capers M.D.   On: 06/07/2017 01:34   Dg Chest Portable 1 View  Result Date: 06/06/2017 CLINICAL DATA:  Central line placement EXAM: PORTABLE CHEST 1 VIEW COMPARISON:  June 06, 2017 7:49 p.m. FINDINGS: Right jugular central venous line is identified with distal tip in the superior vena cava. There is no pneumothorax. Nasogastric tube is identified with distal tip in the stomach. Endotracheal tube is identified with distal tip 2 cm from carina, retraction by 3 cm is recommended. There is pulmonary edema. Minimal bilateral pleural effusions are noted. Consolidation of the right upper and mid lung and left lung base are noted. There is no pneumothorax. The heart size is enlarged. Cardiac pacemaker is unchanged. IMPRESSION: Right jugular central venous line with distal tip in the superior vena cava. There is no pneumothorax. Endotracheal tube is identified distal tip 2 cm from carina, retraction by 3 cm is recommended. Electronically Signed   By: Abelardo Diesel M.D.   On: 06/06/2017 20:37   Dg Chest Portable 1 View  Result Date: 06/06/2017 CLINICAL DATA:  Encounter for OG tube placement EXAM: PORTABLE CHEST 1 VIEW COMPARISON:  06/06/2017 FINDINGS: There is a left chest wall ICD with leads in the right atrial appendage, coronary sinus and right ventricle. ET tube tip is above the carina. There is a enteric tube with side port below the GE junction. Cardiac enlargement and aortic atherosclerosis noted. Bilateral pleural effusions noted left greater than right.  Pulmonary edema pattern is similar to previous exam. The right upper lobe airspace opacity is also unchanged. IMPRESSION: 1. ETT tip is just above the carina. The side port for the enteric tube is below the GE junction. 2. Cardiac enlargement and Aortic Atherosclerosis (ICD10-I70.0). 3. Congestive heart failure. 4. No change in aeration to the right upper lobe compared with previous exam Electronically Signed   By: Kerby Moors M.D.   On: 06/06/2017 20:12   Dg Chest Portable 1 View  Result Date: 06/06/2017 CLINICAL DATA:  Chest pain and respiratory failure. EXAM: PORTABLE CHEST 1 VIEW COMPARISON:  03/03/2017 FINDINGS: Pacer/AICD device. Other leads and wires project over the chest. Midline trachea. Cardiomegaly accentuated by AP portable technique. Left pleural space not well evaluated. No right-sided pleural effusion or pneumothorax. Right greater than left interstitial and airspace disease. IMPRESSION: Bilateral interstitial and airspace disease, at least partially felt to represent pulmonary edema. Infection, especially within the right upper lobe, cannot be excluded. Suboptimal evaluation of the inferior left hemithorax secondary to overlying pacer battery. Electronically Signed   By: Abigail Miyamoto M.D.   On: 06/06/2017 19:46    SIGNIFICANT EVENTS  1/01  Admit with abd pain, N/V, resp distress   STUDIES:  CT Head w/o 1/2 >> no acute abnormality.  Chronic bilateral subdural hygromas without change.  Left  posterior parietal calcified meningioma without change.  Chronic atrophy, small vessel ischemic changes.  LE Duplex 1/2 >>   LINES R IJ TLC 1/1 >>   CULTURES  BCx2 1/1 >>  UC 1/1 >>   ANTIBIOTICS Vanco 1/1 x1  Zosyn 1/1 >>   ASSESSMENT / PLAN:  CARDIAC A: Acute Exacerbation of CHF - BNP >2000, LVEF 15-20% in 09/2016.    Shock - resolving, lactic acid WNL Hx NICM, CAD, AICD, HTN, HLD, LBBB P: ICU monitoring  Levophed for MAP > 65 Anticipate if sedation is minimized, we may be able  to get her off levophed Trend lactic acid May need advanced CHF service to see  Repeat ECHO Diuresis once off pressors   PULMONARY A: Acute hypoxic respiratory failure - concern for aspiration, pulmonary edema Respiratory Alkalosis  P: PRVC 8 cc/kg  Adjust rate to 16 Wean PEEP / FiO2 for sats > 90% Follow intermittent CXR  VAP prevention precautions  SBT / WUA daily  Diurese once off vasopressors  RENAL A: Lactic Acidosis  Non-GAP on admit - ? Related to 2 days diarrhea prior to admit Hyperkalemia - 7 on admit, received IV correction P: Trend BMP / urinary output Replace electrolytes as indicated Avoid nephrotoxic agents, ensure adequate renal perfusion  ID A: Elevated Lactic Acid / Hypotension - in setting of n/v, CHF P: Empiric abx as above  Trend PCT  Follow cultures   GI A: Transaminases elevated Alk phos normal and Bili not elevated - unlikely any calculi or obstructive process in biliary tree.  No h/o ETOH per family Takes Aspirin 81 mg daily. Continue to trend AST/ALT P: NPO  Trend LFT's  OGT  Begin TF  PPI for SUP  PRN zofran for nausea   HEMATOLOGY  A: Thrombocytopenia  R/O DVT - LLE larger then RLE on admit P: Trend CBC  Await LE doppler  Transfuse for Hgb <8 Monitor for bleeding Heparin for DVT / SCD's prophylaxis   ENDOCRINE A: NIDDM  Hypothyroidism - TSH 12.4 on admit P: SSI  Standard SSI  Resume synthroid 66mg QD  NEURO A: Acute metabolic encephalopathy -  CT head w/o change, see above P: RASS Goal:  0 to -1  Minimize sedating medications as able > discontinue propofol  Fentanyl gtt for pain / sedation  Frequent neuro exams   FAMILY:  Daughter at bedside along with other family members. Updated on patient's status, plan for the day.  Prior discussion > supportive care with hope for improvement.  If not a reversible process they would not prolonged life support.  No CPR in the event of arrest.   CODE STATUS: DNR  CC  Time: 30 minutes    BNoe Gens NP-C  Pulmonary & Critical Care Pgr: [phone] or if no answer [phone]/07/2017, 12:39 PM

## 2017-06-07 NOTE — Progress Notes (Signed)
Responded to Spiritual Care Consult to pray with and for this patient and family.  Family states that the patient is [facility].  Chaplain provided safe space for family to share stories about their loved one.  Chaplain prayed with family for the patient.  Will continue to follow up as needed.    06/07/17 1016  Clinical Encounter Type  Visited With Patient and family together  Visit Type Initial;Spiritual support;Critical Care  Spiritual Encounters  Spiritual Needs Prayer

## 2017-06-07 NOTE — Progress Notes (Deleted)
Clinical Summary Donna [name] is a 77 y.o.female  seen today for follow up of the following medical problems.  1. NICM  - long history of cardiomyopathy, prior caths without obstructive disease  - LVEF 20% by echo 06/2011,  - echo 10/2014 LVEF 15-20%.  - 02/2016 echo LVEF 20-25%, grade II diastolic dysfunction - she has BiV AICD followed by Dr Lovena Le.     - seen 03/30/17 by PA Dunn. Patient with low bp's, coreg was stopped.  - weights stable today around 107 lbs. Compression stockings helping. Home weights stable 107 lbs - compliant with meds.  - breathing is stable since last visit.   2. Ventricular tachycardia  - followed by EP. She has BiV AICD.  - no recent palpitations   3. Abnormal thyroid - referred to endocrine, high TSH    - neuro has recommended ASA 31m daiily.    Past Medical History:  Diagnosis Date  . AICD (automatic cardioverter/defibrillator) present   . Arthritis    "fingers" (11/18/2014)  . Atrial flutter (HCC)    Versus ventricular tachycardia; treated with amiodarone  . Cardiomyopathy, nonischemic ([place]    [place] in 12/97; EF of 40% in 5/01 and 7/03, 25% in 9/04 and 4/08.  systolic murmur without valvular abnormalities by echo; refused automatic implantable cardiac defibrillator  . Carotid artery disease ([place]    a. duplex 09/2016 <50% stenosis.  . Chronic systolic CHF (congestive heart failure) ([place]   . Diabetes mellitus, type II ([place]    No insulin  . Gastroesophageal reflux disease   . Hyperlipidemia   . Hypertension   . Left bundle Beckam Abdulaziz block   . Mitral regurgitation   . Pericardial effusion    a. small by echo 2016, not seen in 2018.  . Stroke (cerebrum) ([place] 09/2016  . Ventricular tachycardia ([facility]    a. PMH it lists "atrial flutter versus ventricular tachycardia treated with amiodarone" - details of this are unclear. Notes from back to 2006 indicate she has been maintained on amiodarone  but do not describe further indication. Patient's sister reports pt was told she had skipped beats that could cause her to drop dead - Dr. RLattie Hawput her on amiodarone and recommended a defibrillator.     Allergies  Allergen Reactions  . Codeine Palpitations  . Decongestant [Pseudoephedrine Hcl Er] Palpitations     No current facility-administered medications for this visit.    No current outpatient medications on file.   Facility-Administered Medications Ordered in Other Visits  Medication Dose Route Frequency Provider Last Rate Last Dose  . 0.9 %  sodium chloride infusion  250 mL Intravenous PRN Scatliffe, Kristen D, MD 10 mL/hr at 06/07/17 0000 250 mL at 06/07/17 0000  . chlorhexidine gluconate (MEDLINE KIT) (PERIDEX) 0.12 % solution 15 mL  15 mL Mouth Rinse BID Scatliffe, KCyril MourningD, MD   15 mL at 06/06/17 2300  . fentaNYL (SUBLIMAZE) bolus via infusion 25 mcg  25 mcg Intravenous Q1H PRN Scatliffe, Kristen D, MD      . fentaNYL 25062m in NS 25064m18m67ml) infusion-PREMIX  25-400 mcg/hr Intravenous Continuous Scatliffe, Kristen D, MD 5 mL/hr at 06/07/17 0000 50 mcg/hr at 06/07/17 0000  . heparin injection 5,000 Units  5,000 Units Subcutaneous Q8H Scatliffe, KrisRise Paganini   5,000 Units at 06/07/17 0538  . insulin aspart (novoLOG) injection 2-6 Units  2-6 Units Subcutaneous Q4H Scatliffe, KrisRise Paganini   2 Units at 06/07/17 0030  .  MEDLINE mouth rinse  15 mL Mouth Rinse QID Scatliffe, Rise Paganini, MD   15 mL at 06/07/17 0357  . norepinephrine (LEVOPHED) 4 mg in dextrose 5 % 250 mL (0.016 mg/mL) infusion  0-40 mcg/min Intravenous Titrated Laverle Hobby, MD 7.5 mL/hr at 06/07/17 0345 2 mcg/min at 06/07/17 0345  . ondansetron (ZOFRAN) injection 4 mg  4 mg Intravenous Q6H PRN Scatliffe, Kristen D, MD      . pantoprazole (PROTONIX) injection 40 mg  40 mg Intravenous QHS Scatliffe, Rise Paganini, MD   40 mg at 06/06/17 2303  . piperacillin-tazobactam (ZOSYN) IVPB 3.375 g  3.375 g  Intravenous Vladimir Creeks, MD 12.5 mL/hr at 06/07/17 0538 3.375 g at 06/07/17 0538  . propofol (DIPRIVAN) 1000 MG/100ML infusion  0-50 mcg/kg/min Intravenous Continuous Scatliffe, Kristen D, MD 5.4 mL/hr at 06/07/17 0000 20 mcg/kg/min at 06/07/17 0000  . vancomycin (VANCOCIN) IVPB 1000 mg/200 mL premix  1,000 mg Intravenous Q24H Noemi Chapel, MD         Past Surgical History:  Procedure Laterality Date  . ABDOMINAL HYSTERECTOMY  2006  . BI-VENTRICULAR IMPLANTABLE CARDIOVERTER DEFIBRILLATOR  (CRT-D)  11/18/2014  . COLONOSCOPY  2008  . EP IMPLANTABLE DEVICE N/A 11/18/2014   Procedure: BiV ICD Insertion CRT-D;  Surgeon: Evans Lance, MD;  Location: [facility];  Service: Cardiovascular;  Laterality: N/A;  . HERNIA REPAIR Right   . TONSILLECTOMY       Allergies  Allergen Reactions  . Codeine Palpitations  . Decongestant [Pseudoephedrine Hcl Er] Palpitations      Family History  Problem Relation Age of Onset  . Diabetes Mother   . Kidney disease Mother   . Heart failure Mother   . Stomach cancer Father   . Hypertension Father   . Diabetes Sister   . Seizures Sister   . Heart attack Brother   . Seizures Brother   . Dementia Neg Hx      Social History Ms. Nettle reports that  has never smoked. she has never used smokeless tobacco. Ms. Nesbitt reports that she does not drink alcohol.   Review of Systems CONSTITUTIONAL: No weight loss, fever, chills, weakness or fatigue.  HEENT: Eyes: No visual loss, blurred vision, double vision or yellow sclerae.No hearing loss, sneezing, congestion, runny nose or sore throat.  SKIN: No rash or itching.  CARDIOVASCULAR:  RESPIRATORY: No shortness of breath, cough or sputum.  GASTROINTESTINAL: No anorexia, nausea, vomiting or diarrhea. No abdominal pain or blood.  GENITOURINARY: No burning on urination, no polyuria NEUROLOGICAL: No headache, dizziness, syncope, paralysis, ataxia, numbness or tingling in the extremities. No  change in bowel or bladder control.  MUSCULOSKELETAL: No muscle, back pain, joint pain or stiffness.  LYMPHATICS: No enlarged nodes. No history of splenectomy.  PSYCHIATRIC: No history of depression or anxiety.  ENDOCRINOLOGIC: No reports of sweating, cold or heat intolerance. No polyuria or polydipsia.  Marland Kitchen   Physical Examination There were no vitals filed for this visit. There were no vitals filed for this visit.  Gen: resting comfortably, no acute distress HEENT: no scleral icterus, pupils equal round and reactive, no palptable cervical adenopathy,  CV Resp: Clear to auscultation bilaterally GI: abdomen is soft, non-tender, non-distended, normal bowel sounds, no hepatosplenomegaly MSK: extremities are warm, no edema.  Skin: warm, no rash Neuro:  no focal deficits Psych: appropriate affect   Diagnostic Studies 06/2011 Echo LVEF 20%, mildly dilated, mild LVH, grade I diastolic dysfunction, mild to mod MR, PASP 36  10/04/13 Echo Study Conclusions  - Left ventricle: Left ventricular systolic function is severely reduced, EF 15-20%. Severe global hypokinesis to akinesis is seen. The cavity size was moderately to severely dilated. Wall thickness was increased in a pattern of mild LVH. Doppler parameters are consistent with abnormal left ventricular relaxation (grade 1 diastolic dysfunction). Doppler parameters are consistent with high ventricular filling pressure. - Regional wall motion abnormality: Akinesis of the mid anterior, mid anteroseptal, basal-mid inferoseptal, apical septal, and apical myocardium; severe hypokinesis of the mid inferior and apical lateral myocardium; moderate hypokinesis of the mid inferolateral and basal-mid anterolateral myocardium. - Aortic valve: Mildly calcified annulus. Mildly thickened leaflets. - Mitral valve: Mildly dilated annulus. Mildly thickened leaflets . Mild to moderate regurgitation. - Left atrium: The atrium was moderately  dilated. - Tricuspid valve: Mild regurgitation. - Pulmonary arteries: PA peak pressure: 69m Hg (S). Mildly elevated pulmonary pressures. - Systemic veins: IVC is dilated, with normal respiratory variation. Estimated CVP 8 mmHg. - Pericardium, extracardiac: A trivial pericardial effusion was identified posterior to the heart.  10/2014 Echo Study Conclusions  - Left ventricle: There is global hypokinesis with akinesis of the anterior, inferior walls and paradoxical septal motion. Overall LVEF is severely refused estimated at 15-20%. The cavity size was mildly dilated. There was moderate concentric hypertrophy. Systolic function was severely reduced. The estimated ejection fraction was in the range of 15-20%. Features are consistent with a pseudonormal left ventricular filling pattern, with concomitant abnormal relaxation and increased filling pressure (grade 2 diastolic dysfunction). Doppler parameters are consistent with elevated ventricular end-diastolic filling pressure. - Ventricular septum: Septal motion showed paradox. - Aortic valve: Trileaflet; normal thickness leaflets. There was no regurgitation. - Aortic root: The aortic root was normal in size. - Mitral valve: Structurally normal valve. There was moderate regurgitation. - Left atrium: The atrium was normal in size. - Right ventricle: Systolic function was mildly reduced. - Right atrium: The atrium was normal in size. - Tricuspid valve: There was mild regurgitation. - Pulmonary arteries: Systolic pressure was within the normal range. PA peak pressure: 34 mm Hg (S). - Inferior vena cava: The vessel was normal in size. - Pericardium, extracardiac: A trivial pericardial effusion was identified.    Assessment and Plan   1. NICM/Chronic systolic HF  - home weights stable, we will continue current meds   2. Ventricular tachycardia  - asymptomatic, continue to monitor   F/u 6  weeks     JArnoldo Lenis M.D., F.A.C.C.

## 2017-06-08 ENCOUNTER — Inpatient Hospital Stay (HOSPITAL_COMMUNITY): Payer: Medicare HMO

## 2017-06-08 DIAGNOSIS — M79609 Pain in unspecified limb: Secondary | ICD-10-CM

## 2017-06-08 LAB — PHOSPHORUS
Phosphorus: 3.5 mg/dL (ref 2.5–4.6)
Phosphorus: 2.1 mg/dL — ABNORMAL LOW (ref 2.5–4.6)

## 2017-06-08 LAB — CBC
HCT: 35.2 % — ABNORMAL LOW (ref 36.0–46.0)
Hemoglobin: 11.2 g/dL — ABNORMAL LOW (ref 12.0–15.0)
MCH: 31.4 pg (ref 26.0–34.0)
MCHC: 31.8 g/dL (ref 30.0–36.0)
MCV: 98.6 fL (ref 78.0–100.0)
PLATELETS: 111 10*3/uL — AB (ref 150–400)
RBC: 3.57 MIL/uL — AB (ref 3.87–5.11)
RDW: 16.4 % — ABNORMAL HIGH (ref 11.5–15.5)
WBC: 5.9 10*3/uL (ref 4.0–10.5)

## 2017-06-08 LAB — BASIC METABOLIC PANEL
ANION GAP: 5 (ref 5–15)
BUN: 19 mg/dL (ref 6–20)
CO2: 26 mmol/L (ref 22–32)
Calcium: 7.8 mg/dL — ABNORMAL LOW (ref 8.9–10.3)
Chloride: 105 mmol/L (ref 101–111)
Creatinine, Ser: 0.68 mg/dL (ref 0.44–1.00)
GFR calc Af Amer: >60 mL/min (ref >60)
GLUCOSE: 232 mg/dL — AB (ref 65–99)
Potassium: 3.3 mmol/L — ABNORMAL LOW (ref 3.5–5.1)
Sodium: 136 mmol/L (ref 135–145)

## 2017-06-08 LAB — GLUCOSE, CAPILLARY
GLUCOSE-CAPILLARY: 194 mg/dL — AB (ref 65–99)
GLUCOSE-CAPILLARY: 194 mg/dL — AB (ref 65–99)
GLUCOSE-CAPILLARY: 198 mg/dL — AB (ref 65–99)
Glucose-Capillary: 215 mg/dL — ABNORMAL HIGH (ref 65–99)
Glucose-Capillary: 180 mg/dL — ABNORMAL HIGH (ref 65–99)
Glucose-Capillary: 175 mg/dL — ABNORMAL HIGH (ref 65–99)
Glucose-Capillary: 153 mg/dL — ABNORMAL HIGH (ref 65–99)

## 2017-06-08 LAB — URINE CULTURE: Culture: NO GROWTH

## 2017-06-08 LAB — PROCALCITONIN: PROCALCITONIN: 0.35 ng/mL

## 2017-06-08 LAB — MAGNESIUM
Magnesium: 1.8 mg/dL (ref 1.7–2.4)
Magnesium: 2 mg/dL (ref 1.7–2.4)

## 2017-06-08 MED ORDER — POTASSIUM CHLORIDE 20 MEQ/15ML (10%) PO SOLN
20.0000 meq | ORAL | Status: AC
  Administered 2017-06-08 (×2): 20 meq
  Filled 2017-06-08 (×2): qty 15

## 2017-06-08 MED ORDER — FENTANYL CITRATE (PF) 100 MCG/2ML IJ SOLN
25.0000 ug | INTRAMUSCULAR | Status: DC | PRN
  Administered 2017-06-08: 25 ug via INTRAVENOUS
  Administered 2017-06-08 – 2017-06-09 (×3): 50 ug via INTRAVENOUS
  Filled 2017-06-08 (×4): qty 2

## 2017-06-08 NOTE — Progress Notes (Signed)
Inpatient Diabetes Program Recommendations  AACE/ADA: New Consensus Statement on Inpatient Glycemic Control (2015)  Target Ranges:  Prepandial:   less than 140 mg/dL      Peak postprandial:   less than 180 mg/dL (1-2 hours)      Critically ill patients:  140 - 180 mg/dL   Lab Results  Component Value Date   GLUCAP 215 (H) 06/08/2017   HGBA1C 6.2 (H) 06/06/2017    Review of Glycemic Control Results for Donna [name], Donna [name] (MRN [id number]) as of 06/08/2017 10:52  Ref. Range 06/08/2017 00:07 06/08/2017 03:37 06/08/2017 04:41 06/08/2017 05:53 06/08/2017 08:16  Glucose-Capillary Latest Ref Range: 65 - 99 mg/dL 180 (H)  194 (H)  215 (H)   Diabetes history: Type 2 DM Outpatient Diabetes medications: Levemir 15 Units HS, Januvia 100 mg QD Current orders for Inpatient glycemic control: Novolog 2-6 Units Q4H  Inpatient Diabetes Program Recommendations:     Patient intubated and receiving feeding supplement at 45 ml/hr per feeding tube. Consider Novolog 2 Units Q4H to cover feeds.   Thanks, Bronson Curb, MSN, RNC-OB Diabetes Coordinator [phone] (8a-5p)

## 2017-06-08 NOTE — Progress Notes (Signed)
[facility] ADULT ICU REPLACEMENT PROTOCOL FOR AM LAB REPLACEMENT ONLY  The patient does apply for the [facility] Adult ICU Electrolyte Replacment Protocol based on the criteria listed below:   1. Is GFR >/= 40 ml/min? Yes.    Patient's GFR today is >60 2. Is urine output >/= 0.5 ml/kg/hr for the last 6 hours? Yes.   Patient's UOP is 0.63 ml/kg/hr 3. Is BUN < 60 mg/dL? Yes.    Patient's BUN today is 19 4. Abnormal electrolyte(s): K+=3.3 5. Ordered repletion with: per protocol 6. If a panic level lab has been reported, has the CCM MD in charge been notified? Yes.  .   Physician:  Nena Jordan, Dechelle Attaway Parker 06/08/2017 5:02 AM

## 2017-06-08 NOTE — Progress Notes (Signed)
..  Name: Donna [name] MRN: [id number] DOB: 1940-10-31    ADMISSION DATE:  06/06/2017 CONSULTATION DATE:  06/06/17  REFERRING MD :  Sabra Heck MD  CHIEF COMPLAINT:  Alt mental status, SOB, Weakness, abdominal pain, s/p vomitting  BRIEF PATIENT DESCRIPTION: 77 yr old female with PMHx CAD, NICMP EF 15-20%, s/p AICD, NIDDM, presented 1/1 from home for altered mental status and shortness of breath following complaints of abdominal pain and an episode of vomitting. PCCM consulted post intubation.  Baseline walks around with a cane and does not require assistance to the bathroom. She spends most of her day in a seated position. Within the last few weeks she has shown a steady decline from her baseline. Daughter states that a few weeks ago she had significant diarrhea. States that it was so profuse patient required a bath and a bed change. It eventually subsided (family believes it stopped due to Pepto bismol). Pt also was becoming more confused. Saying things that were difficult to understand. She has had these episodes in the past but it has become more frequent. More recently in the last 3 days her appetite decreased and within the last day patient complained of abdominal pain (which family states is a chronic complaint) however, it was accompanied by her complaining of feeling hot followed by a significant episode of non bilious non bloody vomiting which prompted them to come to the hospital. On presentation according to EDP "she was in respiratory distress requiring a nonrebreather but oxygenating in the mid 80s.  The patient was unable to give much in the way of information secondary to her severe distress".   She was intubated by ED. PCCM was then consulted for admission   SUBJECTIVE:  Tmax 99.7.  Remains in slightly positive balance. Weaning on PSV 10/5 since ~ 0800.    VITAL SIGNS: Temp:  [99 F (37.2 C)-99.7 F (37.6 C)] 99.7 F (37.6 C) (01/03 0900) Pulse Rate:  [59-81] 68 (01/03 1122) Resp:   [9-24] 16 (01/03 1122) BP: (95-123)/(48-67) 110/62 (01/03 1122) SpO2:  [98 %-100 %] 100 % (01/03 1122) FiO2 (%):  [40 %] 40 % (01/03 1122) Weight:  [110 lb 3.7 oz (50 kg)] 110 lb 3.7 oz (50 kg) (01/03 0443)  PHYSICAL EXAMINATION: General:  Frail elderly female in NAD on vent, weaning on PSV HEENT: MM pink/moist, ETT PSY: calm Neuro: awakens to voice, no distress off sedation.  Follows commands CV: s1s2 rrr, no m/r/g PULM: even/non-labored, lungs bilaterally clear anterior / lateral  PI:RJJO, non-tender, bsx4 active  Extremities: warm/dry, BLE 1+ pitting edema  Skin: no rashes or lesions   Recent Labs  Lab 06/06/17 2045 06/07/17 0350 06/08/17 0337  NA 135 138 136  K 7.0* 3.7 3.3*  CL 107 107 105  CO2 15* 23 26  BUN _0 CREATININE 0.91 0.72 0.68  GLUCOSE 138* 83 232*   Recent Labs  Lab 06/06/17 1928 06/06/17 1935 06/07/17 0350 06/08/17 0337  HGB 13.6 14.6 12.7 11.2*  HCT 42.7 43.0 38.0 35.2*  WBC 8.4  --  12.4* 5.9  PLT 137*  --  121* 111*   Ct Head Wo Contrast  Result Date: 06/07/2017 CLINICAL DATA:  Altered level of consciousness. History of stroke, hypertension, diabetes, atrial fibrillation. EXAM: CT HEAD WITHOUT CONTRAST TECHNIQUE: Contiguous axial images were obtained from the base of the skull through the vertex without intravenous contrast. COMPARISON:  03/03/2017 FINDINGS: Brain: Chronic appearing bilateral subdural fluid collections without change since previous study. Mild  cerebral atrophy. No ventricular dilatation. Patchy low-attenuation changes in the deep white matter consistent with small vessel ischemia. No mass-effect or midline shift. No abnormal extra-axial fluid collections. Gray-white matter junctions are distinct. Basal cisterns are not effaced. No acute intracranial hemorrhage. Extra-axial calcification along the left posterior parietal dural surface measuring 9 mm in diameter, likely representing a calcified meningioma. No change since prior  study. Vascular: Internal carotid artery vascular calcifications are present. Skull: The calvarium appears intact. Sinuses/Orbits: Paranasal sinuses and mastoid air cells are not opacified. Other: None. IMPRESSION: No acute intracranial abnormalities. Chronic bilateral subdural hygromas without change since prior study. Left posterior parietal calcified meningioma also without change. Chronic atrophy and small vessel ischemic changes. Electronically Signed   By: Lucienne Capers M.D.   On: 06/07/2017 01:34   Dg Chest Port 1 View  Result Date: 06/08/2017 CLINICAL DATA:  Intubation.  Respiratory failure . EXAM: PORTABLE CHEST 1 VIEW COMPARISON:  06/06/2017. FINDINGS: Endotracheal tube, NG tube, right IJ line stable position. AICD with lead tips in stable position. Cardiomegaly with normal pulmonary vascularity. Interim clearing of right upper lung infiltrate/edema. Persistent bibasilar atelectasis. No prominent pleural effusion. No pneumothorax . IMPRESSION: 1. Lines and tubes stable position. AICD in stable position. Stable cardiomegaly. No pulmonary venous congestion. 2. Interim clearing of right upper lobe infiltrate/edema. Low lung volumes with bibasilar atelectasis. Electronically Signed   By: Marcello Moores  Register   On: 06/08/2017 07:27   Dg Chest Portable 1 View  Result Date: 06/06/2017 CLINICAL DATA:  Central line placement EXAM: PORTABLE CHEST 1 VIEW COMPARISON:  June 06, 2017 7:49 p.m. FINDINGS: Right jugular central venous line is identified with distal tip in the superior vena cava. There is no pneumothorax. Nasogastric tube is identified with distal tip in the stomach. Endotracheal tube is identified with distal tip 2 cm from carina, retraction by 3 cm is recommended. There is pulmonary edema. Minimal bilateral pleural effusions are noted. Consolidation of the right upper and mid lung and left lung base are noted. There is no pneumothorax. The heart size is enlarged. Cardiac pacemaker is unchanged.  IMPRESSION: Right jugular central venous line with distal tip in the superior vena cava. There is no pneumothorax. Endotracheal tube is identified distal tip 2 cm from carina, retraction by 3 cm is recommended. Electronically Signed   By: Abelardo Diesel M.D.   On: 06/06/2017 20:37   Dg Chest Portable 1 View  Result Date: 06/06/2017 CLINICAL DATA:  Encounter for OG tube placement EXAM: PORTABLE CHEST 1 VIEW COMPARISON:  06/06/2017 FINDINGS: There is a left chest wall ICD with leads in the right atrial appendage, coronary sinus and right ventricle. ET tube tip is above the carina. There is a enteric tube with side port below the GE junction. Cardiac enlargement and aortic atherosclerosis noted. Bilateral pleural effusions noted left greater than right. Pulmonary edema pattern is similar to previous exam. The right upper lobe airspace opacity is also unchanged. IMPRESSION: 1. ETT tip is just above the carina. The side port for the enteric tube is below the GE junction. 2. Cardiac enlargement and Aortic Atherosclerosis (ICD10-I70.0). 3. Congestive heart failure. 4. No change in aeration to the right upper lobe compared with previous exam Electronically Signed   By: Kerby Moors M.D.   On: 06/06/2017 20:12   Dg Chest Portable 1 View  Result Date: 06/06/2017 CLINICAL DATA:  Chest pain and respiratory failure. EXAM: PORTABLE CHEST 1 VIEW COMPARISON:  03/03/2017 FINDINGS: Pacer/AICD device. Other leads and wires project over  the chest. Midline trachea. Cardiomegaly accentuated by AP portable technique. Left pleural space not well evaluated. No right-sided pleural effusion or pneumothorax. Right greater than left interstitial and airspace disease. IMPRESSION: Bilateral interstitial and airspace disease, at least partially felt to represent pulmonary edema. Infection, especially within the right upper lobe, cannot be excluded. Suboptimal evaluation of the inferior left hemithorax secondary to overlying pacer battery.  Electronically Signed   By: Abigail Miyamoto M.D.   On: 06/06/2017 19:46    SIGNIFICANT EVENTS  1/01  Admit with abd pain, N/V, resp distress  1/02  Too sleepy to wean 1/03  PSV wean 10/5   STUDIES:  CT Head w/o 1/2 >> no acute abnormality.  Chronic bilateral subdural hygromas without change.  Left posterior parietal calcified meningioma without change.  Chronic atrophy, small vessel ischemic changes.  LE Duplex 1/2 >> prelim negative >> ECHO 1/2 >> LVEF 10-15%, diffuse hypokinesis, grade 2 diastolic dysfunction, PA peak pressure 40 mmHg  LINES R IJ TLC 1/1 >>   CULTURES  BCx2 1/1 >>  UC 1/1 >> negative  ANTIBIOTICS Vanco 1/1 x1  Zosyn 1/1 >>   ASSESSMENT / PLAN:  CARDIAC A: Acute Exacerbation of CHF - BNP >2000, LVEF 15-20% in 09/2016.    Shock - resolving, lactic acid WNL Hx NICM, CAD, AICD, HTN, HLD, LBBB P: ICU monitoring  Trend lactic acid  ECHO noted as above  May need advanced CHF service to see   PULMONARY A: Acute hypoxic respiratory failure - concern for aspiration, pulmonary edema Respiratory Alkalosis  P: PRVC 8 cc/kg  Wean PEEP / FiO2 for sats > 90% Follow CXR  VAP prevention measures  SBT / WUA daily > may be able to extubate this afternoon.  Want to discuss with family.  RENAL A: Lactic Acidosis  Non-GAP on admit - ? Related to 2 days diarrhea prior to admit Hyperkalemia - 7 on admit, received IV correction P: Trend BMP / urinary output Replace electrolytes as indicated Avoid nephrotoxic agents, ensure adequate renal perfusion  ID A: Elevated Lactic Acid / Hypotension - in setting of n/v, CHF P: ABX as above  Trend PCT  Follow cultures   GI A: Transaminases elevated Alk phos normal and Bili not elevated - unlikely any calculi or obstructive process in biliary tree.  No h/o ETOH per family Takes Aspirin 81 mg daily. Continue to trend AST/ALT P: NPO  Trend LFT's  OGT / TF  PPI for SUP  PRN zofran   HEMATOLOGY   A: Thrombocytopenia  R/o DVT - LLE larger then RLE on admit, prelim negative P: Trend CBC Heparin + SCD's for DVT prophylaxis   ENDOCRINE A: NIDDM  Hypothyroidism - TSH 12.4 on admit P: SSI with standard scale  Synthroid 75 mcg QD   NEURO A: Acute metabolic encephalopathy -  CT head w/o change, see above P: RASS Goal:  0  PRN fentanyl for pain / sedation   FAMILY:  No family at bedside am 1/3.  Will update on arrival.   Prior discussion > supportive care with hope for improvement.  If not a reversible process they would not prolonged life support.  No CPR in the event of arrest.   CODE STATUS: DNR  CC Time: 30 minutes    Noe Gens, NP-C [facility] Pgr: [phone] or if no answer [phone] 06/08/2017, 12:00 PM

## 2017-06-08 NOTE — Progress Notes (Signed)
VASCULAR LAB PRELIMINARY  PRELIMINARY  PRELIMINARY  PRELIMINARY  Bilateral lower extremity venous duplex completed.    Preliminary report:  There is no obvious evidence of DVT or SVT noted in the visualized veins of the bilateral lower extremities.   Baili Stang, RVT 06/08/2017, 11:48 AM

## 2017-06-09 ENCOUNTER — Inpatient Hospital Stay (HOSPITAL_COMMUNITY): Payer: Medicare HMO

## 2017-06-09 DIAGNOSIS — J810 Acute pulmonary edema: Secondary | ICD-10-CM

## 2017-06-09 LAB — BASIC METABOLIC PANEL
ANION GAP: 4 — AB (ref 5–15)
BUN: 18 mg/dL (ref 6–20)
CO2: 27 mmol/L (ref 22–32)
Calcium: 8.1 mg/dL — ABNORMAL LOW (ref 8.9–10.3)
Chloride: 109 mmol/L (ref 101–111)
Creatinine, Ser: 0.45 mg/dL (ref 0.44–1.00)
Glucose, Bld: 119 mg/dL — ABNORMAL HIGH (ref 65–99)
Potassium: 3.5 mmol/L (ref 3.5–5.1)
Sodium: 140 mmol/L (ref 135–145)

## 2017-06-09 LAB — GLUCOSE, CAPILLARY
GLUCOSE-CAPILLARY: 128 mg/dL — AB (ref 65–99)
GLUCOSE-CAPILLARY: 134 mg/dL — AB (ref 65–99)
GLUCOSE-CAPILLARY: 182 mg/dL — AB (ref 65–99)
Glucose-Capillary: 120 mg/dL — ABNORMAL HIGH (ref 65–99)
Glucose-Capillary: 188 mg/dL — ABNORMAL HIGH (ref 65–99)
Glucose-Capillary: 184 mg/dL — ABNORMAL HIGH (ref 65–99)
Glucose-Capillary: 183 mg/dL — ABNORMAL HIGH (ref 65–99)

## 2017-06-09 LAB — CBC
HCT: 33.6 % — ABNORMAL LOW (ref 36.0–46.0)
HEMOGLOBIN: 10.5 g/dL — AB (ref 12.0–15.0)
MCH: 31.3 pg (ref 26.0–34.0)
MCHC: 31.3 g/dL (ref 30.0–36.0)
MCV: 100.3 fL — ABNORMAL HIGH (ref 78.0–100.0)
Platelets: 104 10*3/uL — ABNORMAL LOW (ref 150–400)
RBC: 3.35 MIL/uL — AB (ref 3.87–5.11)
RDW: 16.4 % — ABNORMAL HIGH (ref 11.5–15.5)
WBC: 5.5 10*3/uL (ref 4.0–10.5)

## 2017-06-09 MED ORDER — POTASSIUM CHLORIDE 20 MEQ/15ML (10%) PO SOLN
40.0000 meq | Freq: Once | ORAL | Status: AC
Start: 2017-06-09 — End: 2017-06-09
  Administered 2017-06-09: 40 meq
  Filled 2017-06-09: qty 30

## 2017-06-09 MED ORDER — PANTOPRAZOLE SODIUM 40 MG PO PACK: 40.0000 mg | PACK | Freq: Every day | ORAL | Status: DC

## 2017-06-09 NOTE — Procedures (Signed)
Extubation Procedure Note  Patient Details:   Name: Donna [name] DOB: 1941/02/23 MRN: [id number]   Airway Documentation:     Evaluation  O2 sats: stable throughout Complications: No apparent complications Patient did tolerate procedure well. Bilateral Breath Sounds: Diminished, Clear   Yes   Patient extubated to [facility] at this time. Tolerated procedure well. Able to vocalize and clear secretions. RT will monitor as needed  Saunders Glance 06/09/2017, 12:18 PM

## 2017-06-09 NOTE — Progress Notes (Signed)
Pharmacy Antibiotic Note  Donna [name] is a 77 y.o. female admitted on 06/06/2017 with possible sepsis. Pharmacy has been consulted for Zosyn dosing.  Vancomycin was d/c'd yesterday.  Tm 99.7, WBC WNL.  Renal function stable.  Plan: Zosyn 3.375g IV q8h (4 hour infusion). Monitor renal function, clinical status, C&S.  Height: 5\' 1"  (154.9 cm) Weight: 108 lb 11 oz (49.3 kg) IBW/kg (Calculated) : 47.8  Temp (24hrs), Avg:99.4 F (37.4 C), Min:98.8 F (37.1 C), Max:99.9 F (37.7 C)  Recent Labs  Lab 06/06/17 1928 06/06/17 1935 06/06/17 2045 06/06/17 2120 06/07/17 0350 06/08/17 0337 06/09/17 0400  WBC 8.4  --   --   --  12.4* 5.9 5.5  CREATININE  --  0.80 0.91  --  0.72 0.68 0.45  LATICACIDVEN  --   --   --  3.82*  --   --   --   VANCORANDOM  --   --   --   --  10  --   --     Estimated Creatinine Clearance: 44.4 mL/min (by C-G formula based on SCr of 0.45 mg/dL).   Allergies  Allergen Reactions  . Codeine Palpitations  . Decongestant [Pseudoephedrine Hcl Er] Palpitations   Antimicrobials this admission: Vanc 1/1 >> 1/3 Zosyn 1/1 >>   Dose adjustments this admission: None  Microbiology results: 1/1 BCx x2:  ngtd 1/1 UCx: neg 1/1 MRSA PCR- neg  Thank you for allowing pharmacy to be a part of this patient's care.  Uvaldo Rising, BCPS  Clinical Pharmacist Pager [phone]  06/09/2017 10:44 AM

## 2017-06-09 NOTE — Progress Notes (Signed)
Patient assessed at bedside on T-Bar.  Normal work of breathing, tolerating well.  Proceeded with extubation.  Will monitor.   Noe Gens, NP-C [facility] Pgr: [phone] or if no answer [phone] 06/09/2017, 12:31 PM

## 2017-06-09 NOTE — Progress Notes (Signed)
..  Name: Donna [name] MRN: [id number] DOB: 14-Jan-1941    ADMISSION DATE:  06/06/2017 CONSULTATION DATE:  06/06/17  REFERRING MD :  Sabra Heck MD  CHIEF COMPLAINT:  Alt mental status, SOB, Weakness, abdominal pain, s/p vomitting  BRIEF PATIENT DESCRIPTION: 77 yr old female with PMHx CAD, NICMP EF 15-20%, s/p AICD, NIDDM, presented 1/1 from home for altered mental status and shortness of breath following complaints of abdominal pain and an episode of vomitting. PCCM consulted post intubation.  Baseline walks around with a cane and does not require assistance to the bathroom. She spends most of her day in a seated position. Within the last few weeks she has shown a steady decline from her baseline. Daughter states that a few weeks ago she had significant diarrhea. States that it was so profuse patient required a bath and a bed change. It eventually subsided (family believes it stopped due to Pepto bismol). Pt also was becoming more confused. Saying things that were difficult to understand. She has had these episodes in the past but it has become more frequent. More recently in the last 3 days her appetite decreased and within the last day patient complained of abdominal pain (which family states is a chronic complaint) however, it was accompanied by her complaining of feeling hot followed by a significant episode of non bilious non bloody vomiting which prompted them to come to the hospital. On presentation according to EDP "she was in respiratory distress requiring a nonrebreather but oxygenating in the mid 80s.  The patient was unable to give much in the way of information secondary to her severe distress".   She was intubated by ED. PCCM was then consulted for admission   SUBJECTIVE:   Tmax 99.7.  Pt remains off sedation, awake/alert.  No acute events overnight.     VITAL SIGNS: Temp:  [98.8 F (37.1 C)-99.9 F (37.7 C)] 99.7 F (37.6 C) (01/04 1100) Pulse Rate:  [60-103] 81 (01/04 1100) Resp:   [12-29] 20 (01/04 1100) BP: (102-132)/(49-100) 123/73 (01/04 1147) SpO2:  [96 %-100 %] 100 % (01/04 1100) FiO2 (%):  [40 %] 40 % (01/04 1147) Weight:  [108 lb 11 oz (49.3 kg)] 108 lb 11 oz (49.3 kg) (01/04 0500)  PHYSICAL EXAMINATION: General: frail elderly female in NAD on vent  HEENT: MM pink/moist, ETT Neuro: awake/alert, follows commands CV: s1s2 rrr, no m/r/g PULM: even/non-labored, lungs bilaterally basilar crackles VJ:DYNX, non-tender, bsx4 active  Extremities: warm/dry, 1+ BLE pitting edema  Skin: no rashes or lesions   Recent Labs  Lab 06/07/17 0350 06/08/17 0337 06/09/17 0400  NA 138 136 140  K 3.7 3.3* 3.5  CL 107 105 109  CO2 _0 BUN _1 CREATININE 0.72 0.68 0.45  GLUCOSE 83 232* 119*   Recent Labs  Lab 06/07/17 0350 06/08/17 0337 06/09/17 0400  HGB 12.7 11.2* 10.5*  HCT 38.0 35.2* 33.6*  WBC 12.4* 5.9 5.5  PLT 121* 111* 104*   Dg Chest Port 1 View  Result Date: 06/09/2017 CLINICAL DATA:  Respiratory failure. EXAM: PORTABLE CHEST 1 VIEW COMPARISON:  Radiograph June 08, 2017. FINDINGS: Stable cardiomegaly. Atherosclerosis of thoracic aorta is noted. Endotracheal and nasogastric tubes are unremarkable. Right internal jugular catheter is unchanged. Left-sided pacemaker is unchanged. No pneumothorax is noted. Stable bibasilar atelectasis is noted with associated pleural effusions. Bony thorax is unremarkable. IMPRESSION: Stable support apparatus. Stable bibasilar opacities as described above. Electronically Signed   By: Marijo Conception, M.D.  On: 06/09/2017 08:15   Dg Chest Port 1 View  Result Date: 06/08/2017 CLINICAL DATA:  Intubation.  Respiratory failure . EXAM: PORTABLE CHEST 1 VIEW COMPARISON:  06/06/2017. FINDINGS: Endotracheal tube, NG tube, right IJ line stable position. AICD with lead tips in stable position. Cardiomegaly with normal pulmonary vascularity. Interim clearing of right upper lung infiltrate/edema. Persistent bibasilar  atelectasis. No prominent pleural effusion. No pneumothorax . IMPRESSION: 1. Lines and tubes stable position. AICD in stable position. Stable cardiomegaly. No pulmonary venous congestion. 2. Interim clearing of right upper lobe infiltrate/edema. Low lung volumes with bibasilar atelectasis. Electronically Signed   By: Marcello Moores  Register   On: 06/08/2017 07:27    SIGNIFICANT EVENTS  1/01  Admit with abd pain, N/V, resp distress  1/02  Too sleepy to wean 1/03  PSV wean 10/5  1/04  Extubated  STUDIES:  CT Head w/o 1/2 >> no acute abnormality.  Chronic bilateral subdural hygromas without change.  Left posterior parietal calcified meningioma without change.  Chronic atrophy, small vessel ischemic changes.  LE Duplex 1/2 >> prelim negative >> ECHO 1/2 >> LVEF 10-15%, diffuse hypokinesis, grade 2 diastolic dysfunction, PA peak pressure 40 mmHg  LINES R IJ TLC 1/1 >>   CULTURES  BCx2 1/1 >>  UC 1/1 >> negative  ANTIBIOTICS Vanco 1/1 x1  Zosyn 1/1 >>   ASSESSMENT / PLAN:  CARDIAC A: Acute Exacerbation of CHF - BNP >2000, LVEF 15-20% in 09/2016.    Shock - resolving, lactic acid WNL Hx NICM, CAD, AICD, HTN, HLD, LBBB P: ICU monitoring  ECHO as above   PULMONARY A: Acute hypoxic respiratory failure - concern for aspiration, pulmonary edema Respiratory Alkalosis  P: PRVC 8cc/kg  PSV wean / TBar wean to assess for extubation given cardiac hx  Follow CXR   RENAL A: Lactic Acidosis  Non-GAP on admit - ? Related to 2 days diarrhea prior to admit Hyperkalemia - 7 on admit, received IV correction P: Trend BMP / urinary output Replace electrolytes as indicated Avoid nephrotoxic agents, ensure adequate renal perfusion  ID A: Elevated Lactic Acid / Hypotension - in setting of n/v, CHF P: ABX as above, D4/x. Consider 5 days abx Follow cultures  PCT 0.35 on 1/3    GI A: Transaminases elevated Alk phos normal and Bili not elevated - unlikely any calculi or obstructive process in  biliary tree.  No h/o ETOH per family P: NPO  Trend LFT's  Discontinue PPI  PRN zofran   HEMATOLOGY  A: Thrombocytopenia  R/o DVT - LLE larger then RLE on admit, prelim negative P: Trend CBC  Heparin + SCD's for DVT prophylaxis  ENDOCRINE A: NIDDM  Hypothyroidism - TSH 12.4 on admit P: SSI  Synthroid 26mg  NEURO A: Acute metabolic encephalopathy -  CT head w/o change, see above P: Minimize sedating medications    FAMILY:  Family updated at bedside 1/4.    Prior discussion > supportive care with hope for improvement.  If not a reversible process they would not prolonged life support.  No CPR in the event of arrest.   CODE STATUS: DNR  CC Time: 30 minutes   BNoe Gens NP-C [facility] Pgr: [phone] or if no answer [phone]/09/2017, 12:17 PM

## 2017-06-10 ENCOUNTER — Inpatient Hospital Stay (HOSPITAL_COMMUNITY): Payer: Medicare HMO

## 2017-06-10 DIAGNOSIS — J810 Acute pulmonary edema: Secondary | ICD-10-CM

## 2017-06-10 DIAGNOSIS — I5023 Acute on chronic systolic (congestive) heart failure: Secondary | ICD-10-CM

## 2017-06-10 LAB — GLUCOSE, CAPILLARY
GLUCOSE-CAPILLARY: 97 mg/dL (ref 65–99)
GLUCOSE-CAPILLARY: 104 mg/dL — AB (ref 65–99)
Glucose-Capillary: 115 mg/dL — ABNORMAL HIGH (ref 65–99)
Glucose-Capillary: 135 mg/dL — ABNORMAL HIGH (ref 65–99)
Glucose-Capillary: 104 mg/dL — ABNORMAL HIGH (ref 65–99)

## 2017-06-10 LAB — CBC
HCT: 36.6 % (ref 36.0–46.0)
HEMOGLOBIN: 11.7 g/dL — AB (ref 12.0–15.0)
MCH: 32.4 pg (ref 26.0–34.0)
MCHC: 32 g/dL (ref 30.0–36.0)
MCV: 101.4 fL — ABNORMAL HIGH (ref 78.0–100.0)
Platelets: 109 10*3/uL — ABNORMAL LOW (ref 150–400)
RBC: 3.61 MIL/uL — AB (ref 3.87–5.11)
RDW: 16.7 % — ABNORMAL HIGH (ref 11.5–15.5)
WBC: 6.9 10*3/uL (ref 4.0–10.5)

## 2017-06-10 LAB — BASIC METABOLIC PANEL
ANION GAP: 8 (ref 5–15)
BUN: 21 mg/dL — ABNORMAL HIGH (ref 6–20)
CO2: 25 mmol/L (ref 22–32)
Calcium: 9 mg/dL (ref 8.9–10.3)
Chloride: 109 mmol/L (ref 101–111)
Creatinine, Ser: 0.5 mg/dL (ref 0.44–1.00)
Glucose, Bld: 98 mg/dL (ref 65–99)
POTASSIUM: 4.4 mmol/L (ref 3.5–5.1)
Sodium: 142 mmol/L (ref 135–145)

## 2017-06-10 MED ORDER — POTASSIUM CHLORIDE 10 MEQ/100ML IV SOLN
10.0000 meq | INTRAVENOUS | Status: AC
  Administered 2017-06-10 (×2): 10 meq via INTRAVENOUS
  Filled 2017-06-10 (×2): qty 100

## 2017-06-10 MED ORDER — FUROSEMIDE 10 MG/ML IJ SOLN
40.0000 mg | Freq: Once | INTRAMUSCULAR | Status: AC
  Administered 2017-06-10: 40 mg via INTRAVENOUS
  Filled 2017-06-10: qty 4

## 2017-06-10 NOTE — Progress Notes (Signed)
..  Name: Donna [name] MRN: [id number] DOB: 25-Oct-1940    ADMISSION DATE:  06/06/2017 CONSULTATION DATE:  06/06/17  REFERRING MD :  Sabra Heck MD  CHIEF COMPLAINT:  Alt mental status, SOB, Weakness, abdominal pain, s/p vomitting  BRIEF PATIENT DESCRIPTION: 77 yr old female with PMHx CAD, NICMP EF 15-20%, s/p AICD, NIDDM, presented 1/1 from home for altered mental status and shortness of breath following complaints of abdominal pain and an episode of vomitting. PCCM consulted post intubation.  Baseline walks around with a cane and does not require assistance to the bathroom. She spends most of her day in a seated position. Within the last few weeks she has shown a steady decline from her baseline. Daughter states that a few weeks ago she had significant diarrhea. States that it was so profuse patient required a bath and a bed change. It eventually subsided (family believes it stopped due to Pepto bismol). Pt also was becoming more confused. Saying things that were difficult to understand. She has had these episodes in the past but it has become more frequent. More recently in the last 3 days her appetite decreased and within the last day patient complained of abdominal pain (which family states is a chronic complaint) however, it was accompanied by her complaining of feeling hot followed by a significant episode of non bilious non bloody vomiting which prompted them to come to the hospital. On presentation according to EDP "she was in respiratory distress requiring a nonrebreather but oxygenating in the mid 80s.  The patient was unable to give much in the way of information secondary to her severe distress".   She was intubated by ED. PCCM was then consulted for admission   SUBJECTIVE:  Extubated 1/4.  No acute events overnight.  Tmax 100.2.  Poor cough.  CXR noted (see below).     VITAL SIGNS: Temp:  [97.5 F (36.4 C)-100.2 F (37.9 C)] 97.6 F (36.4 C) (01/05 0906) Pulse Rate:  [65-94] 73  (01/05 0600) Resp:  [17-31] 20 (01/05 0600) BP: (102-133)/(60-86) 128/77 (01/05 0600) SpO2:  [73 %-100 %] 100 % (01/05 0600) FiO2 (%):  [40 %] 40 % (01/04 1147) Weight:  [107 lb 2.3 oz (48.6 kg)] 107 lb 2.3 oz (48.6 kg) (01/05 0500)  PHYSICAL EXAMINATION: General: chronically ill appearing / frail elderly female  HEENT: MM pink/moist, weak cough, jvd+ PSY: calm/appropriate  Neuro: AAOx4, speech clear, MAE, generalized weakness  CV: s1s2, S3, rrr, no m/r/g PULM: even/non-labored, lungs bilaterally with crackles 2/3 way up  YS:HUOH, non-tender, bsx4 active  Extremities: warm/dry, 1-2+ BLE pitting edema  Skin: no rashes or lesions  Recent Labs  Lab 06/08/17 0337 06/09/17 0400 06/10/17 0252  NA 136 140 142  K 3.3* 3.5 4.4  CL 105 109 109  CO2 _0 BUN 19 18 21*  CREATININE 0.68 0.45 0.50  GLUCOSE 232* 119* 98   Recent Labs  Lab 06/08/17 0337 06/09/17 0400 06/10/17 0252  HGB 11.2* 10.5* 11.7*  HCT 35.2* 33.6* 36.6  WBC 5.9 5.5 6.9  PLT 111* 104* 109*   Dg Chest Port 1 View  Result Date: 06/10/2017 CLINICAL DATA:  Acute respiratory failure with hypoxia. EXAM: PORTABLE CHEST 1 VIEW COMPARISON:  06/09/2017. FINDINGS: ET tube has been removed. Central venous catheter has been removed. Orogastric tube has been removed.Marked enlargement of cardiac silhouette. Unchanged AICD device. Worsening BILATERAL pulmonary opacities consistent with pulmonary edema. Increasing BILATERAL effusions. No pneumothorax. IMPRESSION: Worsening aeration. Related factors could be increasing  pulmonary edema and/or low lung volumes from extubation. Electronically Signed   By: Staci Righter M.D.   On: 06/10/2017 08:46   Dg Chest Port 1 View  Result Date: 06/09/2017 CLINICAL DATA:  Respiratory failure. EXAM: PORTABLE CHEST 1 VIEW COMPARISON:  Radiograph June 08, 2017. FINDINGS: Stable cardiomegaly. Atherosclerosis of thoracic aorta is noted. Endotracheal and nasogastric tubes are unremarkable. Right  internal jugular catheter is unchanged. Left-sided pacemaker is unchanged. No pneumothorax is noted. Stable bibasilar atelectasis is noted with associated pleural effusions. Bony thorax is unremarkable. IMPRESSION: Stable support apparatus. Stable bibasilar opacities as described above. Electronically Signed   By: Marijo Conception, M.D.   On: 06/09/2017 08:15    SIGNIFICANT EVENTS  1/01  Admit with abd pain, N/V, resp distress  1/02  Too sleepy to wean 1/03  PSV wean 10/5  1/04  Extubated  STUDIES:  CT Head w/o 1/2 >> no acute abnormality.  Chronic bilateral subdural hygromas without change.  Left posterior parietal calcified meningioma without change.  Chronic atrophy, small vessel ischemic changes.  LE Duplex 1/2 >> prelim negative >> ECHO 1/2 >> LVEF 10-15%, diffuse hypokinesis, grade 2 diastolic dysfunction, PA peak pressure 40 mmHg  LINES R IJ TLC 1/1 >>   CULTURES  BCx2 1/1 >>  UC 1/1 >> negative  ANTIBIOTICS Vanco 1/1 x1  Zosyn 1/1 >>   ASSESSMENT / PLAN:  CARDIAC A: Acute Exacerbation of CHF - BNP >2000, LVEF 15-20% in 09/2016.  New reduction this admit.  Small Pericardial Effusion - ? Amyloid    Shock - resolving, lactic acid WNL Hx NICM, CAD, AICD, HTN, HLD, LBBB P: ICU monitoring  ECHO as above  CHF consult, appreciate input Consult Palliative Medicine Doubt family would want aggressive measures (inotropes etc) Lasix 40 mg IV x1 (pressures have been soft)  PULMONARY A: Acute hypoxic respiratory failure - concern for aspiration, pulmonary edema Pulmonary Edema  Respiratory Alkalosis  P: Pulmonary hygiene - IS, mobilize At risk re-intubation, poor cough mechanics.  Daughter going to discuss with family.   Follow CXR  Lasix as above  RENAL A: Lactic Acidosis  Non-GAP on admit - ? Related to 2 days diarrhea prior to admit Hyperkalemia - 7 on admit, received IV correction P: Trend BMP / urinary output Replace electrolytes as indicated Avoid nephrotoxic  agents, ensure adequate renal perfusion  ID A: Elevated Lactic Acid / Hypotension - in setting of n/v, CHF P: ABX as above, D5/5 Follow cultures  Monitor fever curve / WBC trend   GI A: Transaminases elevated Alk phos normal and Bili not elevated - unlikely any calculi or obstructive process in biliary tree.  No h/o ETOH per family P: SLP evaluation > rec's for NPO status  NPO  PRN zofran   HEMATOLOGY  A: Thrombocytopenia  R/o DVT - LLE larger then RLE on admit, prelim negative P: Trend CBC  Monitor for bleeding  SCD's + Heparin for DVT prophylaxis   ENDOCRINE A: NIDDM  Hypothyroidism - TSH 12.4 on admit P: SSI  Synthroid 62mg   NEURO A: Acute metabolic encephalopathy -  CT head w/o change, see above P: Minimize sedating medications   FAMILY:  Daughter updated at bedside am 1/5.  Approached daughter regarding reintubation > she does not think this is in the patients best interest and is going to talk with the family.  They are open to hospice / palliative care.        Prior discussion > supportive care with hope for  improvement.  If not a reversible process they would not prolonged life support.  No CPR in the event of arrest.   CODE STATUS: DNR   Noe Gens, NP-C [facility] Pgr: [phone] or if no answer [phone] 06/10/2017, 10:13 AM

## 2017-06-10 NOTE — Evaluation (Signed)
Clinical/Bedside Swallow Evaluation Patient Details  Name: Donna [name] MRN: [id number] Date of Birth: 1941-03-10  Today's Date: 06/10/2017 Time: SLP Start Time (ACUTE ONLY): 7619 SLP Stop Time (ACUTE ONLY): 0844 SLP Time Calculation (min) (ACUTE ONLY): 18 min  Past Medical History:  Past Medical History:  Diagnosis Date  . AICD (automatic cardioverter/defibrillator) present   . Arthritis    "fingers" (11/18/2014)  . Atrial flutter (HCC)    Versus ventricular tachycardia; treated with amiodarone  . Cardiomyopathy, nonischemic ([place])    Nl cors in [place]; CHF in 12/97; EF of 40% in 5/01 and 7/03, 25% in 9/04 and 4/08.  systolic murmur without valvular abnormalities by echo; refused automatic implantable cardiac defibrillator  . Carotid artery disease ([place])    a. duplex 09/2016 <50% stenosis.  . Chronic systolic CHF (congestive heart failure) ([place])   . Diabetes mellitus, type II ([place])    No insulin  . Gastroesophageal reflux disease   . Hyperlipidemia   . Hypertension   . Left bundle branch block   . Mitral regurgitation   . Pericardial effusion    a. small by echo 2016, not seen in 2018.  . Stroke (cerebrum) ([place]) 09/2016  . Ventricular tachycardia ([place])    a. PMH it lists "atrial flutter versus ventricular tachycardia treated with amiodarone" - details of this are unclear. Notes from back to 2006 indicate she has been maintained on amiodarone but do not describe further indication. Patient's sister reports pt was told she had skipped beats that could cause her to drop dead - Dr. Lattie Haw put her on amiodarone and recommended a defibrillator.   Past Surgical History:  Past Surgical History:  Procedure Laterality Date  . ABDOMINAL HYSTERECTOMY  2006  . BI-VENTRICULAR IMPLANTABLE CARDIOVERTER DEFIBRILLATOR  (CRT-D)  11/18/2014  . COLONOSCOPY  2008  . EP IMPLANTABLE DEVICE N/A 11/18/2014   Procedure: BiV ICD Insertion CRT-D;  Surgeon: Evans Lance, MD;  Location: [facility];  Service: Cardiovascular;  Laterality: N/A;  . HERNIA REPAIR Right   . TONSILLECTOMY     HPI:  77 year old woman with a severe cardiomyopathy and AICD, diabetes, coronary disease, GERD, CVA (09/2016). Admitted with respiratory distress and ultimately acute respiratory failure, bilateral central pulmonary infiltrates suggestive of possible acute on chronic CHF. Intubated 06/06/17-06/09/17.   Assessment / Plan / Recommendation Clinical Impression  Patient present with weak, congested coughing after teaspoons of ice, concerning for decreased airway protection. Currently at high risk for aspiration s/p intubation, with deconditioning, decreased secretion management, altered mentation (oriented to self only, following 50% of simple commands). Anticipate improvements with additional time post-extubation and improvements in mentation. Will follow up next date at bedside to determine readiness for PO vs instrumental assessment. Recommend she remain NPO with medications via alternative means.  SLP Visit Diagnosis: Dysphagia, unspecified (R13.10)    Aspiration Risk  Severe aspiration risk    Diet Recommendation NPO   Medication Administration: Via alternative means    Other  Recommendations Oral Care Recommendations: Oral care QID Other Recommendations: Have oral suction available   Follow up Recommendations Other (comment)(tbd)      Frequency and Duration min 2x/week  2 weeks       Prognosis Prognosis for Safe Diet Advancement: Good Barriers to Reach Goals: Cognitive deficits      Swallow Study   General Date of Onset: 06/06/17 HPI: 77 year old woman with a severe cardiomyopathy and AICD, diabetes, coronary disease, GERD, CVA (09/2016). Admitted with respiratory distress and  ultimately acute respiratory failure, bilateral central pulmonary infiltrates suggestive of possible acute on chronic CHF. Intubated 06/06/17-06/09/17. Type of Study: Bedside Swallow Evaluation Previous Swallow  Assessment: none on file Diet Prior to this Study: NPO Temperature Spikes Noted: Yes(100.2) Respiratory Status: Nasal cannula History of Recent Intubation: Yes Length of Intubations (days): 4 days Date extubated: 06/09/17 Behavior/Cognition: Alert;Cooperative;Confused;Requires cueing Oral Cavity Assessment: Dried secretions Oral Care Completed by SLP: Yes Oral Cavity - Dentition: Dentures, top;Dentures, bottom Vision: Functional for self-feeding Self-Feeding Abilities: Total assist Patient Positioning: Upright in bed Baseline Vocal Quality: Breathy;Hoarse;Low vocal intensity Volitional Cough: Cognitively unable to elicit Volitional Swallow: Unable to elicit    Oral/Motor/Sensory Function Overall Oral Motor/Sensory Function: Generalized oral weakness   Ice Chips Ice chips: Impaired Presentation: Spoon Pharyngeal Phase Impairments: Cough - Delayed;Throat Clearing - Immediate   Thin Liquid Thin Liquid: Not tested    Nectar Thick Nectar Thick Liquid: Not tested   Honey Thick Honey Thick Liquid: Not tested   Puree Puree: Not tested   Solid   GO   Donna [name], [place], CCC-SLP Speech-Language Pathologist [phone] Solid: Not tested        Donna [name] 06/10/2017,8:56 AM

## 2017-06-10 NOTE — Consult Note (Signed)
Advanced Heart Failure Team Consult Note    Primary Cardiologist:   Branch  Reason for Consultation: A/c systolic HF  HPI:    Donna [name] is seen today for evaluation of recurrent HF at the request of Consuello Masse NP-C with CCM.  Ms. Saggese has a h/o HTN, HL, DM, LBBB and severe systolic HF due to NICM. Has been seen at [facility] in the remote past and was on the transplant list but she improved and was de-listed. Since then has followed with Dr. Harl Bowie. She is s/p CRT-D  Over last few months has been getting weaker with low BPs. Only able to ambulate minimally. HF meds have been pulled back due to low BPs.   Admitted 06/06/17 with acute respiratory failure due to flash pulmonary edema. BNP > 2k. Improved with diuresis and positive pressure. Extubated. CXR now worsening again with massive cardiomegaly  Echo reviewed personally EF 10-15% with moderately reduced RV function and moderate pericardial effusion  (last echo 9/17 EF 20%)   Currently feels weak. Denies SOB or CP. Poor appetite    Review of Systems: [y] = yes, [ ]  = no   General: Weight gain [ ] ; Weight loss Blue.Reese ]; Anorexia Blue.Reese ]; Fatigue Blue.Reese ]; Fever [ ] ; Chills [ ] ; Weakness [ ]   Cardiac: Chest pain/pressure [ ] ; Resting SOB [ y]; Exertional SOB [ y]; Orthopnea Blue.Reese ]; Pedal Edema Blue.Reese ]; Palpitations [ ] ; Syncope [ ] ; Presyncope [ ] ; Paroxysmal nocturnal dyspnea[y ]  Pulmonary: Cough [ ] ; Wheezing[ ] ; Hemoptysis[ ] ; Sputum [ ] ; Snoring [ ]   GI: Vomiting[ ] ; Dysphagia[ ] ; Melena[ ] ; Hematochezia [ ] ; Heartburn[ ] ; Abdominal pain [ ] ; Constipation [ y]; Diarrhea Blue.Reese ]; BRBPR [ ]   GU: Hematuria[ ] ; Dysuria [ ] ; Nocturia[ ]   Vascular: Pain in legs with walking [ ] ; Pain in feet with lying flat [ ] ; Non-healing sores [ ] ; Stroke [ ] ; TIA [ ] ; Slurred speech [ ] ;  Neuro: Headaches[ ] ; Vertigo[ ] ; Seizures[ ] ; Paresthesias[ ] ;Blurred vision [ ] ; Diplopia [ ] ; Vision changes [ ]   Ortho/Skin: Arthritis Blue.Reese ]; Joint pain [ y]; Muscle  pain [ ] ; Joint swelling [ ] ; Back Pain [ ] ; Rash [ ]   Psych: Depression[ ] ; Anxiety[ ]   Heme: Bleeding problems [ ] ; Clotting disorders [ ] ; Anemia [ ]   Endocrine: Diabetes [ y]; Thyroid dysfunction[y ]  Home Medications . furosemide  40 mg Intravenous Once  . heparin  5,000 Units Subcutaneous Q8H  . insulin aspart  2-6 Units Subcutaneous Q4H  . levothyroxine  75 mcg Per Tube QAC breakfast    Past Medical History: Past Medical History:  Diagnosis Date  . AICD (automatic cardioverter/defibrillator) present   . Arthritis    "fingers" (11/18/2014)  . Atrial flutter (HCC)    Versus ventricular tachycardia; treated with amiodarone  . Cardiomyopathy, nonischemic ([place])    Nl cors in [place]; CHF in 12/97; EF of 40% in 5/01 and 7/03, 25% in 9/04 and 4/08.  systolic murmur without valvular abnormalities by echo; refused automatic implantable cardiac defibrillator  . Carotid artery disease ([place])    a. duplex 09/2016 <50% stenosis.  . Chronic systolic CHF (congestive heart failure) ([place])   . Diabetes mellitus, type II ([place])    No insulin  . Gastroesophageal reflux disease   . Hyperlipidemia   . Hypertension   . Left bundle branch block   . Mitral regurgitation   . Pericardial effusion  a. small by echo 2016, not seen in 2018.  . Stroke (cerebrum) ([place]) 09/2016  . Ventricular tachycardia ([place])    a. PMH it lists "atrial flutter versus ventricular tachycardia treated with amiodarone" - details of this are unclear. Notes from back to 2006 indicate she has been maintained on amiodarone but do not describe further indication. Patient's sister reports pt was told she had skipped beats that could cause her to drop dead - Dr. Lattie Haw put her on amiodarone and recommended a defibrillator.    Past Surgical History: Past Surgical History:  Procedure Laterality Date  . ABDOMINAL HYSTERECTOMY  2006  . BI-VENTRICULAR IMPLANTABLE CARDIOVERTER DEFIBRILLATOR  (CRT-D)  11/18/2014  . COLONOSCOPY   2008  . EP IMPLANTABLE DEVICE N/A 11/18/2014   Procedure: BiV ICD Insertion CRT-D;  Surgeon: Evans Lance, MD;  Location: [facility];  Service: Cardiovascular;  Laterality: N/A;  . HERNIA REPAIR Right   . TONSILLECTOMY      Family History: Family History  Problem Relation Age of Onset  . Diabetes Mother   . Kidney disease Mother   . Heart failure Mother   . Stomach cancer Father   . Hypertension Father   . Diabetes Sister   . Seizures Sister   . Heart attack Brother   . Seizures Brother   . Dementia Neg Hx     Social History: Social History   Socioeconomic History  . Marital status: Widowed    Spouse name: None  . Number of children: 4  . Years of education: 10  . Highest education level: None  Social Needs  . Financial resource strain: None  . Food insecurity - worry: None  . Food insecurity - inability: None  . Transportation needs - medical: None  . Transportation needs - non-medical: None  Occupational History  . Occupation: Retired    Fish farm manager: RETIRED  Tobacco Use  . Smoking status: Never Smoker  . Smokeless tobacco: Never Used  Substance and Sexual Activity  . Alcohol use: No    Alcohol/week: 0.0 oz  . Drug use: No  . Sexual activity: No    Birth control/protection: Post-menopausal  Other Topics Concern  . None  Social History Narrative   Lives in [place] with her grandson   Physically active   Right-handed   Caffeine: none    Allergies:  Allergies  Allergen Reactions  . Codeine Palpitations  . Decongestant [Pseudoephedrine Hcl Er] Palpitations    Objective:    Vital Signs:   Temp:  [97.5 F (36.4 C)-100.2 F (37.9 C)] 97.6 F (36.4 C) (01/05 0906) Pulse Rate:  [65-94] 73 (01/05 0600) Resp:  [17-31] 20 (01/05 0600) BP: (102-133)/(60-86) 128/77 (01/05 0600) SpO2:  [73 %-100 %] 100 % (01/05 0600) FiO2 (%):  [40 %] 40 % (01/04 1147) Weight:  [48.6 kg (107 lb 2.3 oz)] 48.6 kg (107 lb 2.3 oz) (01/05 0500) Last BM Date:  (PTA)  Weight change: Filed Weights   06/08/17 0443 06/09/17 0500 06/10/17 0500  Weight: 50 kg (110 lb 3.7 oz) 49.3 kg (108 lb 11 oz) 48.6 kg (107 lb 2.3 oz)    Intake/Output:   Intake/Output Summary (Last 24 hours) at 06/10/2017 1057 Last data filed at 06/10/2017 0149 Gross per 24 hour  Intake 150 ml  Output 250 ml  Net -100 ml      Physical Exam    General:  Elderly frail appearing. Sitting in bed. Speaks with muffled voice No resp difficulty HEENT: normal poor dentition  Neck: supple. JVP 8. Carotids 2+ bilat; no bruits. No lymphadenopathy or thyromegaly appreciated. Cor: PMI laterally displaced. Regular rate & rhythm. +s 3 2/6 MR  Lungs: + crackles at bases  Abdomen: soft, nontender, nondistended. No hepatosplenomegaly. No bruits or masses. Good bowel sounds. Extremities: no cyanosis, clubbing, rash, mild edema cachetic  Neuro: alert & orientedx3, cranial nerves grossly intact. moves all 4 extremities w/o difficulty. Affect pleasant   Telemetry   NSR with biv pacing 70s Personally reviewed   EKG    NSR 92 with v-pacing at 92 (06/06/17)  - Personally reviewed   Labs   Basic Metabolic Panel: Recent Labs  Lab 06/06/17 2045 06/07/17 0350 06/07/17 1712 06/08/17 0337 06/08/17 1759 06/09/17 0400 06/10/17 0252  NA 135 138  --  136  --  140 142  K 7.0* 3.7  --  3.3*  --  3.5 4.4  CL 107 107  --  105  --  109 109  CO2 15* 23  --  26  --  27 25  GLUCOSE 138* 83  --  232*  --  119* 98  BUN 17 16  --  19  --  18 21*  CREATININE 0.91 0.72  --  0.68  --  0.45 0.50  CALCIUM 9.6 9.0  --  7.8*  --  8.1* 9.0  MG  --  2.0 1.9 1.8 2.0  --   --   PHOS  --  3.4 3.5 3.5 2.1*  --   --     Liver Function Tests: Recent Labs  Lab 06/06/17 2045  AST 300*  ALT 256*  ALKPHOS 77  BILITOT 1.7*  PROT 6.4*  ALBUMIN 3.9   No results for input(s): LIPASE, AMYLASE in the last 168 hours. No results for input(s): AMMONIA in the last 168 hours.  CBC: Recent Labs  Lab  06/06/17 1928 06/06/17 1935 06/07/17 0350 06/08/17 0337 06/09/17 0400 06/10/17 0252  WBC 8.4  --  12.4* 5.9 5.5 6.9  HGB 13.6 14.6 12.7 11.2* 10.5* 11.7*  HCT 42.7 43.0 38.0 35.2* 33.6* 36.6  MCV 103.6*  --  99.2 98.6 100.3* 101.4*  PLT 137*  --  121* 111* 104* 109*    Cardiac Enzymes: No results for input(s): CKTOTAL, CKMB, CKMBINDEX, TROPONINI in the last 168 hours.  BNP: BNP (last 3 results) Recent Labs    03/24/17 1449 06/06/17 1928  BNP 1,699.0* 2,063.5*    ProBNP (last 3 results) No results for input(s): PROBNP in the last 8760 hours.   CBG: Recent Labs  Lab 06/09/17 1639 06/09/17 2019 06/09/17 2345 06/10/17 0332 06/10/17 0901  GLUCAP 184* 183* 134* 97 104*    Coagulation Studies: No results for input(s): LABPROT, INR in the last 72 hours.   Imaging   Dg Chest Port 1 View  Result Date: 06/10/2017 CLINICAL DATA:  Acute respiratory failure with hypoxia. EXAM: PORTABLE CHEST 1 VIEW COMPARISON:  06/09/2017. FINDINGS: ET tube has been removed. Central venous catheter has been removed. Orogastric tube has been removed.Marked enlargement of cardiac silhouette. Unchanged AICD device. Worsening BILATERAL pulmonary opacities consistent with pulmonary edema. Increasing BILATERAL effusions. No pneumothorax. IMPRESSION: Worsening aeration. Related factors could be increasing pulmonary edema and/or low lung volumes from extubation. Electronically Signed   By: Staci Righter M.D.   On: 06/10/2017 08:46      Medications:     Current Medications: . furosemide  40 mg Intravenous Once  . heparin  5,000 Units Subcutaneous Q8H  . insulin aspart  2-6 Units Subcutaneous Q4H  . levothyroxine  75 mcg Per Tube QAC breakfast     Infusions: . sodium chloride Stopped (06/07/17 1300)  . piperacillin-tazobactam (ZOSYN)  IV Stopped (06/10/17 0905)  . potassium chloride         Patient Profile   77 y/o woman with DM2, CAD and end-stage systolic HF s/p CRT-D admitted  with acute respiratory failure due to recurrent HF. ECHO EF 10-15% with moderately reduces RV function and moderate pericardial effusion   Assessment/Plan   1. Acute on chronic end-stage systolic HF s/p CRT-D - due to NICM. Echo reviewed personally EF 10-15% with moderately reduced RV function and moderate pericardial effusion  - volume status elevated  - she is cachetic and functional status has been declining steadily at home according to her daughter - I do not think there is anything else we can offer her at this point aside from diuresis - I discussed palliative care/hospice with her and her daughter and they are amenable to this. Will place consult - We should strongly consider deactivating ICD as well - Given echo ? Amyloidosis. Will check SPEP and IFE for completeness sake and prognostication   2. Acute respiratory failure with hypoxia due to CHF - Now extubated - Continue diuresis  3. Pericardial effusion - moderate. No evidence of tamponade - ? Amyloid  4. Hypothyroidism   5. Cachexia  D/w CCM. Would recommend continued diuresis and Hospice consult. We will be available for questions.   Length of Stay: [place], MD  06/10/2017, 10:57 AM  Advanced Heart Failure Team Pager [phone] (M-F; 7a - 4p)  Please contact [facility] for night-coverage after hours (4p -7a ) and weekends on [email or web address]

## 2017-06-11 ENCOUNTER — Inpatient Hospital Stay (HOSPITAL_COMMUNITY): Payer: Medicare HMO

## 2017-06-11 DIAGNOSIS — E782 Mixed hyperlipidemia: Secondary | ICD-10-CM

## 2017-06-11 DIAGNOSIS — Z515 Encounter for palliative care: Secondary | ICD-10-CM

## 2017-06-11 DIAGNOSIS — Z95810 Presence of automatic (implantable) cardiac defibrillator: Secondary | ICD-10-CM

## 2017-06-11 DIAGNOSIS — Z794 Long term (current) use of insulin: Secondary | ICD-10-CM

## 2017-06-11 DIAGNOSIS — E039 Hypothyroidism, unspecified: Secondary | ICD-10-CM

## 2017-06-11 DIAGNOSIS — R7989 Other specified abnormal findings of blood chemistry: Secondary | ICD-10-CM

## 2017-06-11 DIAGNOSIS — I428 Other cardiomyopathies: Secondary | ICD-10-CM

## 2017-06-11 DIAGNOSIS — R945 Abnormal results of liver function studies: Secondary | ICD-10-CM

## 2017-06-11 DIAGNOSIS — E8770 Fluid overload, unspecified: Secondary | ICD-10-CM

## 2017-06-11 DIAGNOSIS — E119 Type 2 diabetes mellitus without complications: Secondary | ICD-10-CM

## 2017-06-11 LAB — CULTURE, BLOOD (ROUTINE X 2)
Culture: NO GROWTH
Culture: NO GROWTH
SPECIAL REQUESTS: ADEQUATE
Special Requests: ADEQUATE

## 2017-06-11 LAB — BASIC METABOLIC PANEL
ANION GAP: 10 (ref 5–15)
BUN: 32 mg/dL — ABNORMAL HIGH (ref 6–20)
CO2: 23 mmol/L (ref 22–32)
Calcium: 8.8 mg/dL — ABNORMAL LOW (ref 8.9–10.3)
Chloride: 109 mmol/L (ref 101–111)
Creatinine, Ser: 0.7 mg/dL (ref 0.44–1.00)
GFR calc Af Amer: >60 mL/min (ref >60)
GFR calc non Af Amer: >60 mL/min (ref >60)
Glucose, Bld: 106 mg/dL — ABNORMAL HIGH (ref 65–99)
Potassium: 4.4 mmol/L (ref 3.5–5.1)
SODIUM: 142 mmol/L (ref 135–145)

## 2017-06-11 LAB — CBC
HEMATOCRIT: 41.3 % (ref 36.0–46.0)
HEMOGLOBIN: 13.2 g/dL (ref 12.0–15.0)
MCH: 33.2 pg (ref 26.0–34.0)
MCHC: 32 g/dL (ref 30.0–36.0)
MCV: 103.8 fL — ABNORMAL HIGH (ref 78.0–100.0)
Platelets: 97 10*3/uL — ABNORMAL LOW (ref 150–400)
RBC: 3.98 MIL/uL (ref 3.87–5.11)
RDW: 16.8 % — ABNORMAL HIGH (ref 11.5–15.5)
WBC: 7.6 10*3/uL (ref 4.0–10.5)

## 2017-06-11 LAB — GLUCOSE, CAPILLARY
GLUCOSE-CAPILLARY: 131 mg/dL — AB (ref 65–99)
GLUCOSE-CAPILLARY: 146 mg/dL — AB (ref 65–99)
GLUCOSE-CAPILLARY: 166 mg/dL — AB (ref 65–99)
GLUCOSE-CAPILLARY: 233 mg/dL — AB (ref 65–99)
GLUCOSE-CAPILLARY: 95 mg/dL (ref 65–99)
Glucose-Capillary: 111 mg/dL — ABNORMAL HIGH (ref 65–99)
Glucose-Capillary: 79 mg/dL (ref 65–99)

## 2017-06-11 LAB — BRAIN NATRIURETIC PEPTIDE: B Natriuretic Peptide: >4500 pg/mL — ABNORMAL HIGH (ref 0.0–100.0)

## 2017-06-11 MED ORDER — FUROSEMIDE 10 MG/ML IJ SOLN
20.0000 mg | Freq: Once | INTRAMUSCULAR | Status: AC
  Administered 2017-06-11: 20 mg via INTRAVENOUS
  Filled 2017-06-11: qty 2

## 2017-06-11 NOTE — Progress Notes (Signed)
  Speech Language Pathology Treatment: Dysphagia  Patient Details Name: VERNIS CABACUNGAN MRN: [id number] DOB: Oct 07, 1940 Today's Date: 06/11/2017 Time: 4098-1191 SLP Time Calculation (min) (ACUTE ONLY): 18 min  Assessment / Plan / Recommendation Clinical Impression  Pt with clinical improvements in swallow function today, marked by improved attentiveness, improved voicing (although still hypophonic).  Pt asking for food/coffee. Thin liquids continue to elicit immediate and consistent coughing, concerning for aspiration.  Pt enjoyed applesauce, nectar-thick liquids with no overt s/s of aspiration.  She is expressing desire to eat.  Recommend advancing diet to dysphagia 1, nectar-thick liquids for today.  D/W son and dtr that pt is at risk for aspiration, but to begin this diet cautiously, and we will await discussion with Palliative medicine to help determine longer-term PO recommendations.  They verbalized understanding.   HPI HPI: 77 year old woman with a severe cardiomyopathy and AICD, diabetes, coronary disease, GERD, CVA (09/2016). Admitted with respiratory distress and ultimately acute respiratory failure, bilateral central pulmonary infiltrates suggestive of possible acute on chronic CHF. Intubated 06/06/17-06/09/17.      SLP Plan  Continue with current plan of care       Recommendations  Diet recommendations: Dysphagia 1 (puree);Nectar-thick liquid Liquids provided via: Straw Medication Administration: Crushed with puree Supervision: Staff to assist with self feeding Compensations: Slow rate;Small sips/bites Postural Changes and/or Swallow Maneuvers: Seated upright 90 degrees                Oral Care Recommendations: Oral care BID SLP Visit Diagnosis: Dysphagia, unspecified (R13.10) Plan: Continue with current plan of care       GO                Juan Quam Laurice 06/11/2017, 11:10 AM  Estill Bamberg L. Tivis Ringer, [place] CCC/SLP Pager [phone]

## 2017-06-11 NOTE — Consult Note (Signed)
Consultation Note Date: 06/11/2017   Patient Name: Donna [name]  DOB: [birth year]  MRN: [id number]  Age / Sex: 77 y.o., female  PCP: Sinda Du, MD Referring Physician: Kerney Elbe, DO  Reason for Consultation: Establishing goals of care, Hospice Evaluation and Psychosocial/spiritual support  HPI/Patient Profile: 77 y.o. female  with past medical history of coronary artery disease, nonischemic cardiomyopathy with EF 15-20% diabetes, AICD, hypothyroidism, hypertension, pulmonary edema, admitted on 06/06/2017 with acute respiratory failure secondary to flash pulmonary edema in the setting of decompensated acute combined congestive heart failure.  Patient was intubated on admission; extubated 06/09/2016.   Consult ordered for goals of care specifically hospice in the setting of end-stage heart failure, the activation of AICD  Clinical Assessment and Goals of Care: Patient seen, chart reviewed.  Patient's granddaughter, Cassandria Santee, is at the bedside.  Cassandria Santee states that today "is not a good day".  She states her grandmother is more confused today, decreased appetite.  Patient is alert with mild confusion noted  Patient is alert but due to clinical condition has memory deficits, mild confusion and is unable to speak for herself.  Her daughter, [name] at [phone], is her healthcare proxy.  She also has a son, Clint Widener who is also involved in her care and lives locally.    SUMMARY OF RECOMMENDATIONS   DNR per chart review Goals of care meeting set with patient's daughter for 06/12/2016 at 51 AM.  Specific issues to be addressed : de- activation of AICD, hospice, other medical interventions such as thoracentesis,  in the setting of end-stage heart failure and recent flash pulmonary edema Code Status/Advance Care Planning:  DNR    Symptom Management:   Dyspnea: Patient is subjectively  endorsing shortness of breath.  In addition to targeted pulmonary treatments such as diuresis, oxygen, nebulizer treatments, if opioids are needed would recommend morphine concentrate at 2.5-5 mg every 4 hours as needed for acute shortness of breath or morphine IV 1-2 mg as needed  Pain: Patient is denying chest pain or generalized discomfort.  Continue with Tylenol as needed  Palliative Prophylaxis:   Aspiration, Bowel Regimen, Delirium Protocol, Eye Care, Frequent Pain Assessment, Oral Care and Turn Reposition   Psycho-social/Spiritual:   Desire for further Chaplaincy support:no  Additional Recommendations: Grief/Bereavement Support and Referral to [employer]   Prognosis:   < 3 months barring an acute event, in the setting of end-stage heart failure with recent flash pulmonary edema, EF 15-20%  Discharge Planning: To Be Determined      Primary Diagnoses: Present on Admission: . Volume overload . Acute respiratory failure with hypoxia ([place]) . Acute on chronic systolic congestive heart failure ([place]) . AICD (automatic cardioverter/defibrillator) present . Hyperlipidemia . Hypertension . Hypothyroidism   I have reviewed the medical record, interviewed the patient and family, and examined the patient. The following aspects are pertinent.  Past Medical History:  Diagnosis Date  . AICD (automatic cardioverter/defibrillator) present   . Arthritis    "fingers" (11/18/2014)  . Atrial flutter ([place])  Versus ventricular tachycardia; treated with amiodarone  . Cardiomyopathy, nonischemic ([place])    Nl cors in [place]; CHF in 12/97; EF of 40% in 5/01 and 7/03, 25% in 9/04 and 4/08.  systolic murmur without valvular abnormalities by echo; refused automatic implantable cardiac defibrillator  . Carotid artery disease ([place])    a. duplex 09/2016 <50% stenosis.  . Chronic systolic CHF (congestive heart failure) ([place])   . Diabetes mellitus, type II ([place])    No insulin  .  Gastroesophageal reflux disease   . Hyperlipidemia   . Hypertension   . Left bundle branch block   . Mitral regurgitation   . Pericardial effusion    a. small by echo 2016, not seen in 2018.  . Stroke (cerebrum) ([place]) 09/2016  . Ventricular tachycardia ([place])    a. PMH it lists "atrial flutter versus ventricular tachycardia treated with amiodarone" - details of this are unclear. Notes from back to 2006 indicate she has been maintained on amiodarone but do not describe further indication. Patient's sister reports pt was told she had skipped beats that could cause her to drop dead - Dr. Lattie Haw put her on amiodarone and recommended a defibrillator.   Social History   Socioeconomic History  . Marital status: Widowed    Spouse name: None  . Number of children: 4  . Years of education: 10  . Highest education level: None  Social Needs  . Financial resource strain: None  . Food insecurity - worry: None  . Food insecurity - inability: None  . Transportation needs - medical: None  . Transportation needs - non-medical: None  Occupational History  . Occupation: Retired    Fish farm manager: RETIRED  Tobacco Use  . Smoking status: Never Smoker  . Smokeless tobacco: Never Used  Substance and Sexual Activity  . Alcohol use: No    Alcohol/week: 0.0 oz  . Drug use: No  . Sexual activity: No    Birth control/protection: Post-menopausal  Other Topics Concern  . None  Social History Narrative   Lives in [place] with her grandson   Physically active   Right-handed   Caffeine: none   Family History  Problem Relation Age of Onset  . Diabetes Mother   . Kidney disease Mother   . Heart failure Mother   . Stomach cancer Father   . Hypertension Father   . Diabetes Sister   . Seizures Sister   . Heart attack Brother   . Seizures Brother   . Dementia Neg Hx    Scheduled Meds: . heparin  5,000 Units Subcutaneous Q8H  . insulin aspart  2-6 Units Subcutaneous Q4H  . levothyroxine  75 mcg Per  Tube QAC breakfast   Continuous Infusions: . sodium chloride Stopped (06/10/17 1700)   PRN Meds:.sodium chloride, ondansetron (ZOFRAN) IV, sodium chloride flush Medications Prior to Admission:  Prior to Admission medications   Medication Sig Start Date End Date Taking? Authorizing Provider  amiodarone (PACERONE) 200 MG tablet Take 100 mg by mouth daily at 12 noon.   Yes [provider]  aspirin EC 325 MG EC tablet Take 1 tablet (325 mg total) by mouth daily. 09/25/16  Yes Sinda Du, MD  atorvastatin (LIPITOR) 20 MG tablet Take 1 tablet (20 mg total) by mouth daily at 6 PM. 07/24/14  Yes Sinda Du, MD  carvedilol (COREG) 3.125 MG tablet Take 3.125 mg by mouth 2 (two) times daily with a meal.   Yes [provider]  ferrous sulfate 325 (  65 FE) MG tablet Take 325 mg by mouth daily with breakfast.   Yes [provider]  insulin detemir (LEVEMIR) 100 UNIT/ML injection Inject 15 Units into the skin at bedtime.    Yes Sinda Du, MD  levothyroxine (SYNTHROID) 100 MCG tablet Take 1 tablet (100 mcg total) by mouth daily. Patient taking differently: Take 75 mcg by mouth daily before breakfast.  05/19/17 05/19/18 Yes Lendon Colonel, NP  losartan (COZAAR) 25 MG tablet Take 0.5 tablets (12.5 mg total) by mouth daily. Patient taking differently: Take 25 mg by mouth daily.  06/29/16 08/31/17 Yes Imogene Burn, PA-C  Omega-3 Fatty Acids (FISH OIL PO) Take 1,000 mg by mouth 3 (three) times daily.    Yes [provider]  omeprazole (PRILOSEC) 20 MG capsule Take 20 mg by mouth daily.    Yes [provider]  potassium chloride SA (KLOR-CON M20) 20 MEQ tablet Take 2 tablets in the am , and 2 tablets in the pm Patient taking differently: Take 20-40 mEq by mouth See admin instructions. 40 mEq in the morning and 20 mEq in the evening 03/31/17  Yes Dunn, Dayna N, PA-C  sitaGLIPtin (JANUVIA) 100 MG tablet Take 100 mg by mouth daily.   Yes [provider]  torsemide (DEMADEX) 20 MG tablet Take 2 Tablets (40 mg) In the AM And and Take 1 Tablet ( 20 mg) In the Evening. Patient taking differently: Take 10-20 mg by mouth See admin instructions. 20mg  in the morning and if swelling take an additional 10mg  in the afternoon 03/28/17  Yes Dunn, Dayna N, PA-C  UNIFINE PENTIPS 32G X 4 MM MISC USE TWICE A DAY TO TAKE INSULIN. 01/06/16   Cassandria Anger, MD   Allergies  Allergen Reactions  . Codeine Palpitations  . Decongestant [Pseudoephedrine Hcl Er] Palpitations   Review of Systems  Unable to perform ROS: Mental status change    Physical Exam  Constitutional:  Frail, appears ill, older female  HENT:  Head: Normocephalic and atraumatic.  Neck: Normal range of motion.  Cardiovascular: Normal rate.  Pulmonary/Chest:  Mild increased work of breathing at rest Subjectively endorsing feeling short of breath  Abdominal: Soft.  Neurological: She is alert.  Oriented to person, mild confusion noted Difficult to understand speech since extubation  Skin: Skin is warm and dry. There is pallor.  Psychiatric:  Mild confusion  Nursing note and vitals reviewed.   Vital Signs: BP 124/82 (BP Location: Left Arm)   Pulse 80   Temp 98 F (36.7 C) (Axillary)   Resp 16   Ht 5\' 1"  (1.549 m)   Wt 50.4 kg (111 lb 1.8 oz)   SpO2 99%   BMI 20.99 kg/m  Pain Assessment: No/denies pain POSS *See Group Information*: 2-Acceptable,Slightly drowsy, easily aroused Pain Score: 0-No pain   SpO2: SpO2: 99 % O2 Device:SpO2: 99 % O2 Flow Rate: .O2 Flow Rate (L/min): 2 L/min  IO: Intake/output summary:   Intake/Output Summary (Last 24 hours) at 06/11/2017 1702 Last data filed at 06/11/2017 0836 Gross per 24 hour  Intake 0 ml  Output 30 ml  Net -30 ml    LBM: Last BM Date: (PTA) Baseline Weight: Weight: 45.4 kg (100 lb) Most recent weight: Weight: 50.4 kg (111 lb 1.8 oz)     Palliative Assessment/Data:   Flowsheet Rows     Most Recent  Value  Intake Tab  Referral Department  Hospitalist  Unit at Time of Referral  Med/Surg Unit  Palliative  Care Primary Diagnosis  Cardiac  Date Notified  06/10/17  Palliative Care Type  New Palliative care  Reason for referral  Clarify Goals of Care  Date of Admission  06/06/17  Date first seen by Palliative Care  06/11/17  # of days Palliative referral response time  1 Day(s)  # of days IP prior to Palliative referral  4  Clinical Assessment  Palliative Performance Scale Score  30%  Pain Max last 24 hours  Not able to report  Pain Min Last 24 hours  Not able to report  Dyspnea Max Last 24 Hours  Not able to report  Dyspnea Min Last 24 hours  Not able to report  Nausea Max Last 24 Hours  Not able to report  Nausea Min Last 24 Hours  Not able to report  Anxiety Max Last 24 Hours  Not able to report  Anxiety Min Last 24 Hours  Not able to report  Other Max Last 24 Hours  Not able to report  Psychosocial & Spiritual Assessment  Palliative Care Outcomes  Patient/Family meeting held?  Yes  Who was at the meeting?  appt sch with dtr 1/7 11am  Palliative Care follow-up planned  Yes, Facility      Time In: 1615 Time Out: 1717 Time Total: 62 min Greater than 50%  of this time was spent counseling and coordinating care related to the above assessment and plan.   Signed by: Dory Horn, NP   Please contact Palliative Medicine Team phone at [phone] for questions and concerns.  For individual provider: See Shea Evans

## 2017-06-11 NOTE — Progress Notes (Signed)
PROGRESS NOTE    Donna [name]  GUR:[id number] DOB: [birth year] DOA: 06/06/2017 PCP: Sinda Du, MD   Brief Narrative:  The patient 77 yr old female with PMHx CAD, NICMP EF 15-20%, s/p AICD, NIDDM, presented 1/1 from home for altered mental status and shortness of breath following complaints of abdominal pain and an episode of vomitting. Went int to Acute Respiratory Failure with Hypoxia 2/2 to CHF. PCCM consulted post intubation and stabilized the patient. Extubated 1/4 and transferred to [facility] Service 1/6. Cardiology evaluated and don't have much to offer and recommending Palliative Care. Palliative Care consulted and awaiting to evaluate the patient.  Assessment & Plan:   Active Problems:   Hypothyroidism   Hyperlipidemia   Cardiomyopathy, nonischemic ([place])   Hypertension   Diabetes mellitus, type II ([place])   Acute respiratory failure with hypoxia (HCC)   NICM (nonischemic cardiomyopathy) ([place])   Acute on chronic systolic congestive heart failure (HCC)   AICD (automatic cardioverter/defibrillator) present   Volume overload   Acute pulmonary edema (HCC)  Acute Respiratory Failure with Hypoxia 2/2 to Flash Pulmonary Edema in the setting of Acute Decompensation of Acute Decompensation of Combined Chronic Systolic and Diastolic CHF with EF of 28-31% and Grade 2 DD requiring Invasive Pulmonary Mechanical Ventulation -S/p Extubation -C/w Continuous Pulse Oximetry and Supplemental O2 as Necessary -C/w Diuresis as belwo -IV Abx Discontinued as doubt PNA -C/w Incentive Spirometery -Repeat CXR this AM showed improving aeration likely 2/2 decreasing interstitial edema, stable marked cardiomegaly, and similar bibasilar airspace opacities likely reflecting a combination of pleural fluid with atelectasis and/or Infiltrate.  -SLP evaluated and placed on Dysphagia 1 Diet  -Palliative Care Consulted for Goals of Care and Support  Acute Decompensation of Combined Chronic Systolic and Diastolic  CHF with EF of 51-76% and Grade 2 DD s/p cardiac resynchronization therapy (CRT) defibrillator -Has a Hx of NICM, CAD, AICD, HTN, HLD, and LBBB -ECHO as below -Cardiology cannot offer anything else except Diuresiss- -BNP elevated >4500 today and previously was 2,063.5 -Given IV Lasix 20 mg today -Cardiology recommending Palliative Care Consult as Prognosis is poor -Strict I's/O's; Daily Weights, and SLIV -Patient is +713.3 and Weight is + 11 lbs -CXR as above -Palliative Care Team still to Evaluate Patient  Pericardial Effusion -Noted on ECHO -No evidence of Tamponade -C/w Diuresis as Above -Cardiology and PCCM ? Amyloid so studies were sent off  Left Sided Pleural Effusion -Likely in the setting of Heart Failure -Repeat CXR in AM  -May need Thoracentesis but don't know if she will be agreeable  Hypothyroidism -TSH was 12.4 on Admission -C/w Levothyroxine 75 mcg Daily  NIDDM Type 2 -C/w Standard Scale Insulin 2-6 units sq q4h -CBG's ranging from 111-131   Thrombocytopenia -Platelet Count went from 109 -> 97 -Continue to Monitor for S/Sx of Bleeding -Repeat CBC in AM   Abnormal LFT's/Transaminitis -AST was 300 on Admission and ALT was 256 on Admission -Possibly 2/2 to Hepatic Congestion from Volume Overload -Repeat CMP in AM   Severe Malnutrition in the Context of Chronic Illness -Nutritionist consulted  DVT prophylaxis: Heparin 5,000 units sq q8h Code Status: DO NOT RESUSCITATE Family Communication: Discussed with family present at bedside Disposition Plan: Possible Hospice pending Palliative Care Discussion   Consultants:   PCCM Transfer  SLP  Cardiology    Procedures:  ECHOCARDIOGRAM ------------------------------------------------------------------- Study Conclusions  - Left ventricle: The cavity size was severely dilated. There was   moderate concentric hypertrophy. Systolic function was normal.   The estimated ejection  fraction was in the range  of 10% to 15%.   Diffuse hypokinesis. Features are consistent with a pseudonormal   left ventricular filling pattern, with concomitant abnormal   relaxation and increased filling pressure (grade 2 diastolic   dysfunction). Doppler parameters are consistent with elevated   ventricular end-diastolic filling pressure. - Aortic valve: There was trivial regurgitation. - Mitral valve: Calcified annulus. Mildly thickened leaflets .   There was moderate regurgitation. Valve area by pressure   half-time: 2.5 cm^2. - Left atrium: The atrium was severely dilated. - Right ventricle: The cavity size was mildly dilated. Wall   thickness was normal. Systolic function was moderately reduced. - Tricuspid valve: There was mild regurgitation. - Pulmonary arteries: Systolic pressure was mildly to moderately   increased. PA peak pressure: 40 mm Hg (S). - Inferior vena cava: The vessel was normal in size. The   respirophasic diameter changes were in the normal range (>= 50%),   consistent with normal central venous pressure. - Pericardium, extracardiac: There was moderate to large   pericardial effusion. Features were not consistent with tamponade   physiology. There was a left pleural effusion.  Impressions:  - When compared to the prior study from 09/22/2016 LVEF remains   severely dilated with severe systolic dysfunction. RVEF is   moderately decreased.   Pericardial effusion is larger, moderate to severe with maximum   diameter 1.9 cm around the inferolateral wall with no signs of   tamponade.   There is a large left pleural effusion.   Antimicrobials:  Anti-infectives (From admission, onward)   Start     Dose/Rate Route Frequency Ordered Stop   06/07/17 2100  vancomycin (VANCOCIN) IVPB 1000 mg/200 mL premix  Status:  Discontinued     1,000 mg 200 mL/hr over 60 Minutes Intravenous Every 24 hours 06/06/17 2031 06/08/17 1141   06/07/17 0600  piperacillin-tazobactam (ZOSYN) IVPB 3.375 g  Status:   Discontinued     3.375 g 12.5 mL/hr over 240 Minutes Intravenous Every 8 hours 06/06/17 2031 06/10/17 1335   06/06/17 2030  piperacillin-tazobactam (ZOSYN) IVPB 3.375 g     3.375 g 100 mL/hr over 30 Minutes Intravenous  Once 06/06/17 2019 06/06/17 2134   06/06/17 2030  vancomycin (VANCOCIN) IVPB 1000 mg/200 mL premix     1,000 mg 200 mL/hr over 60 Minutes Intravenous  Once 06/06/17 2019 06/06/17 2204     Subjective: Seen and examined and daughter was feeding her some pudding. Patient thinks legs are less swollen. Still has SOB. No CP. Denied any lightheadeness or dizziness.   Objective: Vitals:   06/10/17 2100 06/10/17 2159 06/11/17 0512 06/11/17 1437  BP: 121/82 134/82 128/85 124/82  Pulse: 70 80 75 80  Resp: 16 16 16 16   Temp:  98 F (36.7 C) 98.4 F (36.9 C) 98 F (36.7 C)  TempSrc:  Axillary Axillary Axillary  SpO2: 100% 99% 100% 99%  Weight:  50.4 kg (111 lb 1.8 oz)    Height:        Intake/Output Summary (Last 24 hours) at 06/11/2017 1458 Last data filed at 06/11/2017 0836 Gross per 24 hour  Intake 60 ml  Output 230 ml  Net -170 ml   Filed Weights   06/09/17 0500 06/10/17 0500 06/10/17 2159  Weight: 49.3 kg (108 lb 11 oz) 48.6 kg (107 lb 2.3 oz) 50.4 kg (111 lb 1.8 oz)   Examination: Physical Exam:  Constitutional: Thin cachetic appearing Caucasian female in NAD and appears calm  Eyes: Lids  and conjunctivae normal, sclerae anicteric  ENMT: External Ears, Nose appear normal. Grossly normal hearing. Mucous membranes are moist. .  Neck: Appears normal, supple, no cervical masses, normal ROM, no appreciable thyromegaly; Mild JVD; Has bandage around neck from CVC was Respiratory: Diminished to auscultation bilaterally, no wheezing, rales, rhonchi or crackles. Normal respiratory effort and patient is not tachypenic. No accessory muscle use.  Cardiovascular: RRR, no murmurs / rubs / gallops. S1 and S2 auscultated. Trace Lower extremity edema. Abdomen: Soft, non-tender,  non-distended. No masses palpated. No appreciable hepatosplenomegaly. Bowel sounds positive x4.  GU: Deferred. Musculoskeletal: No clubbing / cyanosis of digits/nails. No joint deformity upper and lower extremities Skin: No rashes, lesions, ulcers on a limited skin eval. No induration; Warm and dry.  Neurologic: CN 2-12 grossly intact with no focal deficits. Romberg sign and cerebellar reflexes not assessed.  Psychiatric: Normal judgment and insight. Alert and oriented x 3. Normal mood and appropriate affect.   Data Reviewed: I have personally reviewed following labs and imaging studies  CBC: Recent Labs  Lab 06/07/17 0350 06/08/17 0337 06/09/17 0400 06/10/17 0252 06/11/17 0435  WBC 12.4* 5.9 5.5 6.9 7.6  HGB 12.7 11.2* 10.5* 11.7* 13.2  HCT 38.0 35.2* 33.6* 36.6 41.3  MCV 99.2 98.6 100.3* 101.4* 103.8*  PLT 121* 111* 104* 109* 97*   Basic Metabolic Panel: Recent Labs  Lab 06/07/17 0350 06/07/17 1712 06/08/17 0337 06/08/17 1759 06/09/17 0400 06/10/17 0252 06/11/17 0435  NA 138  --  136  --  140 142 142  K 3.7  --  3.3*  --  3.5 4.4 4.4  CL 107  --  105  --  109 109 109  CO2 23  --  26  --  27 25 23   GLUCOSE 83  --  232*  --  119* 98 106*  BUN 16  --  19  --  18 21* 32*  CREATININE 0.72  --  0.68  --  0.45 0.50 0.70  CALCIUM 9.0  --  7.8*  --  8.1* 9.0 8.8*  MG 2.0 1.9 1.8 2.0  --   --   --   PHOS 3.4 3.5 3.5 2.1*  --   --   --    GFR: Estimated Creatinine Clearance: 44.4 mL/min (by C-G formula based on SCr of 0.7 mg/dL). Liver Function Tests: Recent Labs  Lab 06/06/17 2045  AST 300*  ALT 256*  ALKPHOS 77  BILITOT 1.7*  PROT 6.4*  ALBUMIN 3.9   No results for input(s): LIPASE, AMYLASE in the last 168 hours. No results for input(s): AMMONIA in the last 168 hours. Coagulation Profile: No results for input(s): INR, PROTIME in the last 168 hours. Cardiac Enzymes: No results for input(s): CKTOTAL, CKMB, CKMBINDEX, TROPONINI in the last 168 hours. BNP (last 3  results) No results for input(s): PROBNP in the last 8760 hours. HbA1C: No results for input(s): HGBA1C in the last 72 hours. CBG: Recent Labs  Lab 06/10/17 1939 06/11/17 0005 06/11/17 0406 06/11/17 0759 06/11/17 1214  GLUCAP 104* 79 95 111* 131*   Lipid Profile: No results for input(s): CHOL, HDL, LDLCALC, TRIG, CHOLHDL, LDLDIRECT in the last 72 hours. Thyroid Function Tests: No results for input(s): TSH, T4TOTAL, FREET4, T3FREE, THYROIDAB in the last 72 hours. Anemia Panel: No results for input(s): VITAMINB12, FOLATE, FERRITIN, TIBC, IRON, RETICCTPCT in the last 72 hours. Sepsis Labs: Recent Labs  Lab 06/06/17 2120 06/07/17 0350 06/08/17 0337  PROCALCITON  --  0.30 0.35  LATICACIDVEN  3.82*  --   --     Recent Results (from the past 240 hour(s))  Urine culture     Status: None   Collection Time: 06/06/17  8:20 PM  Result Value Ref Range Status   Specimen Description URINE, CATHETERIZED  Final   Special Requests NONE  Final   Culture NO GROWTH  Final   Report Status 06/08/2017 FINAL  Final  Blood Culture (routine x 2)     Status: None   Collection Time: 06/06/17  8:50 PM  Result Value Ref Range Status   Specimen Description BLOOD RIGHT HAND  Final   Special Requests IN PEDIATRIC BOTTLE Blood Culture adequate volume  Final   Culture NO GROWTH 5 DAYS  Final   Report Status 06/11/2017 FINAL  Final  Blood Culture (routine x 2)     Status: None   Collection Time: 06/06/17  8:53 PM  Result Value Ref Range Status   Specimen Description BLOOD RIGHT CENTRAL LINE  Final   Special Requests   Final    BOTTLES DRAWN AEROBIC AND ANAEROBIC Blood Culture adequate volume   Culture NO GROWTH 5 DAYS  Final   Report Status 06/11/2017 FINAL  Final  MRSA PCR Screening     Status: None   Collection Time: 06/06/17 11:37 PM  Result Value Ref Range Status   MRSA by PCR NEGATIVE NEGATIVE Final    Comment:        The GeneXpert MRSA Assay (FDA approved for NASAL specimens only), is one  component of a comprehensive MRSA colonization surveillance program. It is not intended to diagnose MRSA infection nor to guide or monitor treatment for MRSA infections.     Radiology Studies: Dg Chest Port 1 View  Result Date: 06/11/2017 CLINICAL DATA:  77 year old female with acute respiratory failure and hypoxia EXAM: PORTABLE CHEST 1 VIEW COMPARISON:  Prior chest x-ray 06/10/2017 FINDINGS: Stable position of left subclavian approach biventricular cardiac rhythm maintenance device. Leads project over the right atrium, right ventricle and overlying the left ventricle. Marked of large of the cardiopericardial silhouette appears stable. Slightly improved aeration with decreasing interstitial edema. Persistent bibasilar opacities likely reflecting a combination of pleural fluid with atelectasis. No pneumothorax. No acute osseous abnormality. IMPRESSION: 1. Improving aeration likely secondary to decreasing interstitial edema. 2. Stable marked cardiomegaly. 3. Similar bibasilar airspace opacities likely reflecting a combination of pleural fluid with atelectasis and/or infiltrate. Electronically Signed   By: Jacqulynn Cadet M.D.   On: 06/11/2017 10:05   Dg Chest Port 1 View  Result Date: 06/10/2017 CLINICAL DATA:  Acute respiratory failure with hypoxia. EXAM: PORTABLE CHEST 1 VIEW COMPARISON:  06/09/2017. FINDINGS: ET tube has been removed. Central venous catheter has been removed. Orogastric tube has been removed.Marked enlargement of cardiac silhouette. Unchanged AICD device. Worsening BILATERAL pulmonary opacities consistent with pulmonary edema. Increasing BILATERAL effusions. No pneumothorax. IMPRESSION: Worsening aeration. Related factors could be increasing pulmonary edema and/or low lung volumes from extubation. Electronically Signed   By: Staci Righter M.D.   On: 06/10/2017 08:46   Scheduled Meds: . heparin  5,000 Units Subcutaneous Q8H  . insulin aspart  2-6 Units Subcutaneous Q4H  .  levothyroxine  75 mcg Per Tube QAC breakfast   Continuous Infusions: . sodium chloride Stopped (06/10/17 1700)    LOS: 5 days   Kerney Elbe, DO [facility] Pager [phone]  If 7PM-7AM, please contact night-coverage [email or web address] Password [email or web address] 06/11/2017, 2:58 PM

## 2017-06-12 ENCOUNTER — Inpatient Hospital Stay (HOSPITAL_COMMUNITY): Payer: Medicare HMO

## 2017-06-12 DIAGNOSIS — Z515 Encounter for palliative care: Secondary | ICD-10-CM

## 2017-06-12 DIAGNOSIS — E875 Hyperkalemia: Secondary | ICD-10-CM

## 2017-06-12 DIAGNOSIS — I10 Essential (primary) hypertension: Secondary | ICD-10-CM

## 2017-06-12 DIAGNOSIS — R0602 Shortness of breath: Secondary | ICD-10-CM

## 2017-06-12 DIAGNOSIS — Z7189 Other specified counseling: Secondary | ICD-10-CM

## 2017-06-12 LAB — COMPREHENSIVE METABOLIC PANEL
ALT: 466 U/L — ABNORMAL HIGH (ref 14–54)
AST: 180 U/L — ABNORMAL HIGH (ref 15–41)
Albumin: 2.8 g/dL — ABNORMAL LOW (ref 3.5–5.0)
Alkaline Phosphatase: 52 U/L (ref 38–126)
Anion gap: 10 (ref 5–15)
BUN: 41 mg/dL — ABNORMAL HIGH (ref 6–20)
CHLORIDE: 110 mmol/L (ref 101–111)
CO2: 24 mmol/L (ref 22–32)
Calcium: 8.7 mg/dL — ABNORMAL LOW (ref 8.9–10.3)
Creatinine, Ser: 0.69 mg/dL (ref 0.44–1.00)
Glucose, Bld: 135 mg/dL — ABNORMAL HIGH (ref 65–99)
POTASSIUM: 4 mmol/L (ref 3.5–5.1)
Sodium: 144 mmol/L (ref 135–145)
Total Bilirubin: 1.7 mg/dL — ABNORMAL HIGH (ref 0.3–1.2)
Total Protein: 4.8 g/dL — ABNORMAL LOW (ref 6.5–8.1)

## 2017-06-12 LAB — CBC WITH DIFFERENTIAL/PLATELET
BASOS ABS: 0 10*3/uL (ref 0.0–0.1)
Basophils Relative: 0 %
EOS ABS: 0 10*3/uL (ref 0.0–0.7)
EOS PCT: 0 %
HCT: 38.9 % (ref 36.0–46.0)
Hemoglobin: 12.8 g/dL (ref 12.0–15.0)
LYMPHS ABS: 1 10*3/uL (ref 0.7–4.0)
LYMPHS PCT: 16 %
MCH: 33.6 pg (ref 26.0–34.0)
MCHC: 32.9 g/dL (ref 30.0–36.0)
MCV: 102.1 fL — AB (ref 78.0–100.0)
Monocytes Absolute: 0.7 10*3/uL (ref 0.1–1.0)
Monocytes Relative: 11 %
Neutro Abs: 4.6 10*3/uL (ref 1.7–7.7)
Neutrophils Relative %: 73 %
PLATELETS: 92 10*3/uL — AB (ref 150–400)
RBC: 3.81 MIL/uL — ABNORMAL LOW (ref 3.87–5.11)
RDW: 16.7 % — ABNORMAL HIGH (ref 11.5–15.5)
WBC: 6.3 10*3/uL (ref 4.0–10.5)

## 2017-06-12 LAB — MAGNESIUM: MAGNESIUM: 2.3 mg/dL (ref 1.7–2.4)

## 2017-06-12 LAB — PHOSPHORUS: PHOSPHORUS: 3.1 mg/dL (ref 2.5–4.6)

## 2017-06-12 LAB — GLUCOSE, CAPILLARY
GLUCOSE-CAPILLARY: 144 mg/dL — AB (ref 65–99)
GLUCOSE-CAPILLARY: 140 mg/dL — AB (ref 65–99)

## 2017-06-12 MED ORDER — HALOPERIDOL LACTATE 5 MG/ML IJ SOLN: 0.5000 mg | INTRAMUSCULAR | Status: DC | PRN

## 2017-06-12 MED ORDER — GLYCOPYRROLATE 1 MG PO TABS: 1.0000 mg | ORAL_TABLET | ORAL | 0 refills | Status: AC | PRN

## 2017-06-12 MED ORDER — HALOPERIDOL 1 MG PO TABS: 0.5000 mg | ORAL_TABLET | ORAL | Status: DC | PRN

## 2017-06-12 MED ORDER — RESOURCE THICKENUP CLEAR PO POWD: 1.0000 | ORAL | 0 refills | Status: AC | PRN

## 2017-06-12 MED ORDER — GLYCOPYRROLATE 1 MG PO TABS
1.0000 mg | ORAL_TABLET | ORAL | Status: DC | PRN
  Filled 2017-06-12: qty 1

## 2017-06-12 MED ORDER — HALOPERIDOL LACTATE 2 MG/ML PO CONC: 0.5000 mg | ORAL | Status: DC | PRN

## 2017-06-12 MED ORDER — ACETAMINOPHEN 650 MG RE SUPP: 650.0000 mg | Freq: Four times a day (QID) | RECTAL | Status: DC | PRN

## 2017-06-12 MED ORDER — GLYCOPYRROLATE 0.2 MG/ML IJ SOLN: 0.2000 mg | INTRAMUSCULAR | Status: DC | PRN

## 2017-06-12 MED ORDER — BISACODYL 10 MG RE SUPP: 10.0000 mg | Freq: Once | RECTAL | 0 refills | Status: AC

## 2017-06-12 MED ORDER — ACETAMINOPHEN 325 MG PO TABS: 650.0000 mg | ORAL_TABLET | Freq: Four times a day (QID) | ORAL | 0 refills | Status: AC | PRN

## 2017-06-12 MED ORDER — SODIUM CHLORIDE 0.9% FLUSH
3.0000 mL | Freq: Two times a day (BID) | INTRAVENOUS | Status: DC
  Administered 2017-06-13 (×2): 3 mL via INTRAVENOUS

## 2017-06-12 MED ORDER — BISACODYL 10 MG RE SUPP
10.0000 mg | Freq: Once | RECTAL | Status: AC
  Administered 2017-06-12: 10 mg via RECTAL
  Filled 2017-06-12: qty 1

## 2017-06-12 MED ORDER — RESOURCE THICKENUP CLEAR PO POWD
ORAL | Status: DC | PRN
  Filled 2017-06-12: qty 125

## 2017-06-12 MED ORDER — ACETAMINOPHEN 325 MG PO TABS: 650.0000 mg | ORAL_TABLET | Freq: Four times a day (QID) | ORAL | Status: DC | PRN

## 2017-06-12 MED ORDER — HALOPERIDOL LACTATE 2 MG/ML PO CONC: 0.5000 mg | ORAL | 0 refills | Status: AC | PRN

## 2017-06-12 MED ORDER — MORPHINE SULFATE (CONCENTRATE) 10 MG/0.5ML PO SOLN: 5.0000 mg | ORAL | Status: DC | PRN

## 2017-06-12 MED ORDER — BIOTENE DRY MOUTH MT LIQD: 15.0000 mL | OROMUCOSAL | 0 refills | Status: AC | PRN

## 2017-06-12 MED ORDER — SODIUM CHLORIDE 0.9% FLUSH: 3.0000 mL | INTRAVENOUS | Status: DC | PRN

## 2017-06-12 MED ORDER — BIOTENE DRY MOUTH MT LIQD: 15.0000 mL | OROMUCOSAL | Status: DC | PRN

## 2017-06-12 MED ORDER — POLYVINYL ALCOHOL 1.4 % OP SOLN
1.0000 [drp] | Freq: Four times a day (QID) | OPHTHALMIC | Status: DC | PRN
  Filled 2017-06-12: qty 15

## 2017-06-12 MED ORDER — MORPHINE SULFATE (CONCENTRATE) 10 MG/0.5ML PO SOLN: 5.0000 mg | ORAL | 0 refills | Status: AC | PRN

## 2017-06-12 MED ORDER — SODIUM CHLORIDE 0.9 % IV SOLN: 250.0000 mL | INTRAVENOUS | Status: DC | PRN

## 2017-06-12 MED ORDER — POLYVINYL ALCOHOL 1.4 % OP SOLN: 1.0000 [drp] | Freq: Four times a day (QID) | OPHTHALMIC | 0 refills | Status: AC | PRN

## 2017-06-12 NOTE — Care Management Note (Addendum)
Case Management Note  Patient Details  Name: Donna [name] MRN: [id number] Date of Birth: 04/20/1941  Subjective/Objective:                    Action/Plan: Spoke with daughter Donna [name] at bedside regarding disposition. Donna [name] wants to take her mother home with [facility], family has use this agency before.   Patient's grandson lives with patient , Donna [name] [phone]. Donna [name] states they have a big family and can provide 24 hour assistance at home.   Patient will need oxygen and hospital bed at home.   Patient will need ambulance transport home at time of discharge to address on face sheet.  Confirmed PCP is DR Sinda Du.  Referral called and faxed to [place] [facility] phone [phone] fax [phone]. Vito Backers will review notes and see if they can accept referal. Awaiting call back.  Expected Discharge Date:                  Expected Discharge Plan:  Home w Hospice Care  In-House Referral:     Discharge planning Services  CM Consult  Post Acute Care Choice:  Hospice Choice offered to:  Adult Children  DME Arranged:    DME Agency:     HH Arranged:    HH Agency:  [facility]  Status of Service:  In process, will continue to follow  If discussed at Long Length of Stay Meetings, dates discussed:    Additional Comments:  Marilu Favre, RN 06/12/2017, 12:49 PM

## 2017-06-12 NOTE — Progress Notes (Signed)
Progress Note  Patient Name: Donna [name] Date of Encounter: 06/12/2017  Primary Cardiologist: Dr. Harl Bowie  Subjective   Pt is not speaking, family at bedside.  Inpatient Medications    Scheduled Meds: . heparin  5,000 Units Subcutaneous Q8H  . insulin aspart  2-6 Units Subcutaneous Q4H  . levothyroxine  75 mcg Per Tube QAC breakfast   Continuous Infusions: . sodium chloride Stopped (06/10/17 1700)   PRN Meds: sodium chloride, ondansetron (ZOFRAN) IV, RESOURCE THICKENUP CLEAR, sodium chloride flush   Vital Signs    Vitals:   06/11/17 1437 06/11/17 2100 06/12/17 0500 06/12/17 0531  BP: 124/82 124/72  109/81  Pulse: 80 74  72  Resp: 16 16    Temp: 98 F (36.7 C) 98.4 F (36.9 C)  97.6 F (36.4 C)  TempSrc: Axillary Axillary  Axillary  SpO2: 99% 94%  95%  Weight:  108 lb 3.9 oz (49.1 kg) 108 lb 3.9 oz (49.1 kg)   Height:        Intake/Output Summary (Last 24 hours) at 06/12/2017 0954 Last data filed at 06/12/2017 0600 Gross per 24 hour  Intake 170 ml  Output -  Net 170 ml   Filed Weights   06/10/17 2159 06/11/17 2100 06/12/17 0500  Weight: 111 lb 1.8 oz (50.4 kg) 108 lb 3.9 oz (49.1 kg) 108 lb 3.9 oz (49.1 kg)     Physical Exam   General: elderly thin female, no acute distress Head: Normocephalic, atraumatic.  Neck: Supple without bruits, no JVD Lungs:  Resp regular and unlabored, CTA. Heart: RRR, S1, S2, no murmur; no rub. Abdomen: Soft, non-tender, non-distended with normoactive bowel sounds. No hepatomegaly. No rebound/guarding. No obvious abdominal masses. Extremities: No clubbing, cyanosis, + edema. Distal pedal pulses are 1+ bilaterally. Neuro: Alert and oriented X 3. Moves all extremities spontaneously. Psych: Normal affect.  Labs    Chemistry Recent Labs  Lab 06/06/17 2045  06/10/17 0252 06/11/17 0435 06/12/17 0843  NA 135   < > 142 142 144  K 7.0*   < > 4.4 4.4 4.0  CL 107   < > 109 109 110  CO2 15*   < > 25 23 24   GLUCOSE 138*   <  > 98 106* 135*  BUN 17   < > 21* 32* 41*  CREATININE 0.91   < > 0.50 0.70 0.69  CALCIUM 9.6   < > 9.0 8.8* 8.7*  PROT 6.4*  --   --   --  4.8*  ALBUMIN 3.9  --   --   --  2.8*  AST 300*  --   --   --  180*  ALT 256*  --   --   --  466*  ALKPHOS 77  --   --   --  52  BILITOT 1.7*  --   --   --  1.7*  GFRNONAA 59*   < > >60 >60 >60  GFRAA >60   < > >60 >60 >60  ANIONGAP 13   < > 8 10 10    < > = values in this interval not displayed.     Hematology Recent Labs  Lab 06/10/17 0252 06/11/17 0435 06/12/17 0843  WBC 6.9 7.6 6.3  RBC 3.61* 3.98 3.81*  HGB 11.7* 13.2 12.8  HCT 36.6 41.3 38.9  MCV 101.4* 103.8* 102.1*  MCH 32.4 33.2 33.6  MCHC 32.0 32.0 32.9  RDW 16.7* 16.8* 16.7*  PLT 109* 97* 92*  Cardiac EnzymesNo results for input(s): TROPONINI in the last 168 hours.  Recent Labs  Lab 06/06/17 1932  TROPIPOC 0.00     BNP Recent Labs  Lab 06/06/17 1928 06/11/17 0435  BNP 2,063.5* >4,500.0*     DDimer No results for input(s): DDIMER in the last 168 hours.   Radiology    Dg Chest Port 1 View  Result Date: 06/12/2017 CLINICAL DATA:  Shortness of Breath EXAM: PORTABLE CHEST 1 VIEW COMPARISON:  06/11/2017 FINDINGS: Cardiac shadow remains enlarged. Defibrillator is again seen and stable. Aortic calcifications are again noted. Stable bibasilar changes are noted. No new focal abnormality is seen. IMPRESSION: No significant interval change from the previous day. Electronically Signed   By: Inez Catalina M.D.   On: 06/12/2017 07:50   Dg Chest Port 1 View  Result Date: 06/11/2017 CLINICAL DATA:  77 year old female with acute respiratory failure and hypoxia EXAM: PORTABLE CHEST 1 VIEW COMPARISON:  Prior chest x-ray 06/10/2017 FINDINGS: Stable position of left subclavian approach biventricular cardiac rhythm maintenance device. Leads project over the right atrium, right ventricle and overlying the left ventricle. Marked of large of the cardiopericardial silhouette appears stable.  Slightly improved aeration with decreasing interstitial edema. Persistent bibasilar opacities likely reflecting a combination of pleural fluid with atelectasis. No pneumothorax. No acute osseous abnormality. IMPRESSION: 1. Improving aeration likely secondary to decreasing interstitial edema. 2. Stable marked cardiomegaly. 3. Similar bibasilar airspace opacities likely reflecting a combination of pleural fluid with atelectasis and/or infiltrate. Electronically Signed   By: Jacqulynn Cadet M.D.   On: 06/11/2017 10:05     Telemetry    N/A - Personally Reviewed  ECG    No new tracings - Personally Reviewed   Cardiac Studies   Echo 06/07/17: Study Conclusions - Left ventricle: The cavity size was severely dilated. There was   moderate concentric hypertrophy. Systolic function was normal.   The estimated ejection fraction was in the range of 10% to 15%.   Diffuse hypokinesis. Features are consistent with a pseudonormal   left ventricular filling pattern, with concomitant abnormal   relaxation and increased filling pressure (grade 2 diastolic   dysfunction). Doppler parameters are consistent with elevated   ventricular end-diastolic filling pressure. - Aortic valve: There was trivial regurgitation. - Mitral valve: Calcified annulus. Mildly thickened leaflets .   There was moderate regurgitation. Valve area by pressure   half-time: 2.5 cm^2. - Left atrium: The atrium was severely dilated. - Right ventricle: The cavity size was mildly dilated. Wall   thickness was normal. Systolic function was moderately reduced. - Tricuspid valve: There was mild regurgitation. - Pulmonary arteries: Systolic pressure was mildly to moderately   increased. PA peak pressure: 40 mm Hg (S). - Inferior vena cava: The vessel was normal in size. The   respirophasic diameter changes were in the normal range (>= 50%),   consistent with normal central venous pressure. - Pericardium, extracardiac: There was moderate  to large   pericardial effusion. Features were not consistent with tamponade   physiology. There was a left pleural effusion.  Impressions: - When compared to the prior study from 09/22/2016 LVEF remains   severely dilated with severe systolic dysfunction. RVEF is   moderately decreased.   Pericardial effusion is larger, moderate to severe with maximum   diameter 1.9 cm around the inferolateral wall with no signs of   tamponade.   There is a large left pleural effusion.   Patient Profile     77 y.o. female has a  h/o HTN, HL, DM, LBBB and severe systolic HF due to NICM. Has been seen at [facility] in the remote past and was on the transplant list but she improved and was de-listed. Since then has followed with Dr. Harl Bowie. She is s/p CRT-D. HF medications have been reduced for marginal pressures. She is admitted with acute respiratory failure due to flash pulmonary edema and BNP >2K. She is now extubated.  Assessment & Plan    1. Acute on chronic end-stage systolic heart failure s/p CRT-D, NICM, moderate pericardial effusion - BNP > 2K - hypoxic respiratory failure and flash pulmonary edema requiring intubation, now extubated - CXR with cardiomegaly - seen by DB over the weekend, no further medication changes, hospice/palliative care recommended - palliative care discussed goals of care and ICD deactivation with family - family agrees with deactivation, rep at bedside - continue diuresis for comfort and SOB - sCr 0.69   2. Pericardial effusion - moderate to severe by echo without tamponade - question utility of workup for amyloidosis   Signed, Ledora Bottcher , PA-C 9:54 AM 06/12/2017 Pager: [phone]

## 2017-06-12 NOTE — Progress Notes (Signed)
Daily Progress Note   Patient Name: Donna [name]       Date: 06/12/2017 DOB: 21-Nov-1940  Age: 77 y.o. MRN#: [id number] Attending Physician: Kerney Elbe, DO Primary Care Physician: Sinda Du, MD Admit Date: 06/06/2017  Reason for Consultation/Follow-up: Establishing goals of care  Subjective: Patient is pleasantly demented.  Complains of constipation, but no other complaints.  Family states she has been talking to relatives who died years ago.    I spoke with the daughter [name]), and son Berton Mount) as well as niece, grand dtr, and grand son in the conference room.  The family understands she is dying they asked questions about her heart and her prognosis which I answered.  We discussed turning off the AICD.  They are comfortable with that.  We discussed Hospice at home versus [place].  They have a large family in [place] with a lot of support and they want to take her home.    Assessment: Frail cachectic elderly female who appears older than her 66 years.  With end stage heart disease.  Now bed bound with severe deconditioning.  Minimal PO intake.  Appropriate for end of life hospice care at home or hospice house.   Patient Profile/HPI: 77 y.o. female  with past medical history of coronary artery disease, nonischemic cardiomyopathy with EF 15-20% diabetes, AICD, hypothyroidism, hypertension, pulmonary edema, admitted on 06/06/2017 with acute respiratory failure secondary to flash pulmonary edema in the setting of decompensated acute combined congestive heart failure.  Patient was intubated on admission; extubated 06/09/2016.      Length of Stay: 6  Current Medications: Scheduled Meds:  . bisacodyl  10 mg Rectal Once  . levothyroxine  75 mcg Per Tube QAC breakfast    . sodium chloride flush  3 mL Intravenous Q12H    Continuous Infusions: . sodium chloride Stopped (06/10/17 1700)  . sodium chloride      PRN Meds: sodium chloride, sodium chloride, acetaminophen **OR** acetaminophen, antiseptic oral rinse, glycopyrrolate **OR** glycopyrrolate **OR** glycopyrrolate, haloperidol **OR** haloperidol **OR** haloperidol lactate, morphine CONCENTRATE **OR** morphine CONCENTRATE, ondansetron (ZOFRAN) IV, polyvinyl alcohol, RESOURCE THICKENUP CLEAR, sodium chloride flush, sodium chloride flush  Physical Exam        Thin, frail, cachectic.  Awake, whispering voice, difficult to understand CV regular and brady. Resp decreased inspiration Abdomen  soft, nt Extremities warm without edema   Vital Signs: BP 109/81 (BP Location: Left Arm)   Pulse 72   Temp 97.6 F (36.4 C) (Axillary)   Resp 16   Ht 5\' 1"  (1.549 m)   Wt 49.1 kg (108 lb 3.9 oz)   SpO2 95%   BMI 20.45 kg/m  SpO2: SpO2: 95 % O2 Device: O2 Device: Nasal Cannula O2 Flow Rate: O2 Flow Rate (L/min): 3 L/min  Intake/output summary:   Intake/Output Summary (Last 24 hours) at 06/12/2017 1136 Last data filed at 06/12/2017 0600 Gross per 24 hour  Intake 170 ml  Output -  Net 170 ml   LBM: Last BM Date: (PTA) Baseline Weight: Weight: 45.4 kg (100 lb) Most recent weight: Weight: 49.1 kg (108 lb 3.9 oz)       Palliative Assessment/Data: 20%      Patient Active Problem List   Diagnosis Date Noted  . Abnormal LFTs 06/11/2017  . Palliative care by specialist   . Acute pulmonary edema ([place])   . Volume overload 06/06/2017  . Cerebrovascular disease 09/20/2016  . AICD (automatic cardioverter/defibrillator) present 09/20/2016  . Acute CVA (cerebrovascular accident) ([place]) 09/20/2016  . Confusion 06/27/2016  . Nausea 03/09/2016  . Dizziness 02/03/2016  . Fall on flat surface, tripped on rug; no injury 01/19/2016    Class: Acute  . Hypokalemia 05/18/2015  . Acute on chronic systolic congestive  heart failure ([place]) 05/16/2015  . NICM (nonischemic cardiomyopathy) ([place]) 11/18/2014  . Acute respiratory failure with hypoxia ([place]) 10/18/2014  . Abnormality of gait 09/26/2013  . Weakness of left hip 09/26/2013  . Diabetes mellitus, type II ([place]) 06/15/2011  . Cardiomyopathy, nonischemic ([place])   . Hypertension   . Hypothyroidism 05/28/2009  . Left bundle branch block 05/28/2009  . GASTROESOPHAGEAL REFLUX DISEASE 05/28/2009  . Hyperlipidemia 05/14/2008  . VENTRICULAR TACHYCARDIA 05/14/2008  . CEREBROVASCULAR DISEASE 05/14/2008    Palliative Care Plan    Recommendations/Plan:  Move to full comfort.   D/C orders not related to comfort.  D/C home with Hospice care.  Family requests [facility] hospice they have had several relatives in their care in the past.  Turn off [employer] ICD today.    Please ensure patient has a prescription for sub lingual morphine concentrate at discharge.  Goals of Care and Additional Recommendations:  Limitations on Scope of Treatment: Full Comfort Care  Code Status:  DNR  Prognosis:   < 2 weeks likely days as I believe her volume status will decline very quickly.  Discharge Planning:  Home with Hospice  Care plan was discussed with family, bedside RN, and [facility] attending.  Thank you for allowing the Palliative Medicine Team to assist in the care of this patient.  Total time spent:  35 min.     Greater than 50%  of this time was spent counseling and coordinating care related to the above assessment and plan.  Florentina Jenny, PA-C Palliative Medicine  Please contact Palliative MedicineTeam phone at [phone] for questions and concerns between 7 am - 7 pm.   Please see AMION for individual provider pager numbers.

## 2017-06-12 NOTE — Care Management Important Message (Signed)
Important Message  Patient Details  Name: Donna [name] MRN: [id number] Date of Birth: 01-18-1941   Medicare Important Message Given:  Yes    Orbie Pyo 06/12/2017, 2:11 PM

## 2017-06-12 NOTE — Discharge Summary (Addendum)
Physician Discharge Summary  Donna [name] WVP:[id number] DOB: 1940/11/27 DOA: 06/06/2017  PCP: Sinda Du, MD  Admit date: 06/06/2017 Discharge date: 06/13/2017  Admitted From: Home Disposition: Home with Home Hospice  ADDENDUM: Patient was not discharged yesterday because all DME equipment was not arranged. Patient was seen and examined today and is stable and there have been no changes overnight. Stable to D/C Home with Hospice today.   Recommendations for Outpatient Follow-up:  1. Follow up Care per Hospice  Home Health: No Equipment/Devices: None  Discharge Condition: Guarded CODE STATUS: Comfort Measures Diet recommendation: Dysphagia 1 Diet with Nectar Thick Liquids  Brief/Interim Summary: The patient 77 yr old female with PMHx CAD, NICMP EF 15-20%, s/p AICD, NIDDM, presented 1/1 from home for altered mental status and shortness of breath following complaints of abdominal pain and an episode of vomitting. Went int to Acute Respiratory Failure with Hypoxia 2/2 to CHF. PCCM consulted post intubation and stabilized the patient. Extubated 1/4 and transferred to [facility] 1/6. Cardiology evaluated and don't have much to offer and recommending Palliative Care. Palliative Care and after discussion with the family and focus has shifted toward comfort as family wants to take the patient home.   Discharge Diagnoses:  Active Problems:   Hypothyroidism   Hyperlipidemia   Cardiomyopathy, nonischemic ([place])   Hypertension   Diabetes mellitus, type II ([place])   Acute respiratory failure with hypoxia (HCC)   NICM (nonischemic cardiomyopathy) ([place])   Acute on chronic systolic congestive heart failure (HCC)   AICD (automatic cardioverter/defibrillator) present   Volume overload   Acute pulmonary edema (HCC)   Abnormal LFTs   Palliative care by specialist   Comfort measures only status   Palliative care encounter   Encounter for hospice care discussion  Acute Respiratory Failure with  Hypoxia 2/2 to Flash Pulmonary Edema in the setting of Acute Decompensation of Acute Decompensation of Combined Chronic Systolic and Diastolic CHF with EF of 54-62% and Grade 2 DD requiring Invasive Pulmonary Mechanical Ventulation -S/p Extubation -C/w Continuous Pulse Oximetry and Supplemental O2 as Necessary -IV Abx Discontinued as doubt PNA -C/w Incentive Spirometery -Repeat CXR this AM showed improving aeration likely 2/2 decreasing interstitial edema, stable marked cardiomegaly, and similar bibasilar airspace opacities likely reflecting a combination of pleural fluid with atelectasis and/or Infiltrate.  -SLP evaluated and placed on Dysphagia 1 Diet  -Palliative Care Consulted for Goals of Care and Support and goals of Care shifted toward Central orders placed by Palliative and all medications not focused on patient's comfort D/C'd -C/w Lasix PRN for Comfort  Acute Decompensation of Combined Chronic Systolic and Diastolic CHF with EF of 70-35% and Grade 2 DD s/p cardiac resynchronization therapy (CRT) defibrillator -Has a Hx of NICM, CAD, AICD, HTN, HLD, and LBBB -ECHO as below -Cardiology cannot offer anything else except Diuresiss- -BNP elevated >4500 today and previously was 2,063.5 -Given IV Lasix 20 mg today -Cardiology recommending Palliative Care Consult as Prognosis is poor -Strict I's/O's; Daily Weights, and SLIV -Patient is +883.3 and Weight is + 8 lbs -CXR as above -Patient now Comfort -AICD will be turned off   Pericardial Effusion -Noted on ECHO -No evidence of Tamponade -Cardiology and PCCM ? Amyloid so studies were sent off but pateint will be transitioning to comfort  Left Sided Pleural Effusion -Likely in the setting of Heart Failure -Patient transitioned to [place] after discussion with Palliative Care Medicine  Hypothyroidism -TSH was 12.4 on Admission -D/C'd Levothyroxine 75 mcg Daily  NIDDM Type 2 -D/C'd Standard Scale  Insulin 2-6 units sq q4h -CBG's ranging from 140-233 -Will not check Blood Sugars again   Thrombocytopenia -Platelet Count went from 109 -> 97 -> 92 -Continue to Monitor for S/Sx of Bleeding -Will not repeat CBC in AM as patient is now comfort  Abnormal LFT's/Transaminitis -AST was 300 on Admission and ALT was 256 on Admission -Possibly 2/2 to Hepatic Congestion from Volume Overload -Repeat LFT's showed AST of 180 and ALT of 466 -Will not repeat CMP as patient is now comfort   Severe Malnutrition in the Context of Chronic Illness -Nutritionist consulted but patient now comfort   Discharge Instructions  Discharge Instructions    Call MD for:  difficulty breathing, headache or visual disturbances   Complete by:  As directed    Call MD for:  extreme fatigue   Complete by:  As directed    Call MD for:  hives   Complete by:  As directed    Call MD for:  persistant dizziness or light-headedness   Complete by:  As directed    Call MD for:  persistant nausea and vomiting   Complete by:  As directed    Call MD for:  redness, tenderness, or signs of infection (pain, swelling, redness, odor or green/yellow discharge around incision site)   Complete by:  As directed    Call MD for:  severe uncontrolled pain   Complete by:  As directed    Call MD for:  temperature >100.4   Complete by:  As directed    Diet - low sodium heart healthy   Complete by:  As directed    Discharge instructions   Complete by:  As directed    Follow up Care per Hospice Protocol   Increase activity slowly   Complete by:  As directed      Allergies as of 06/13/2017      Reactions   Codeine Palpitations   Decongestant [pseudoephedrine Hcl Er] Palpitations      Medication List    STOP taking these medications   amiodarone 200 MG tablet Commonly known as:  PACERONE   aspirin 325 MG EC tablet   atorvastatin 20 MG tablet Commonly known as:  LIPITOR   carvedilol 3.125 MG tablet Commonly known as:   COREG   ferrous sulfate 325 (65 FE) MG tablet   FISH OIL PO   insulin detemir 100 UNIT/ML injection Commonly known as:  LEVEMIR   levothyroxine 100 MCG tablet Commonly known as:  SYNTHROID   losartan 25 MG tablet Commonly known as:  COZAAR   omeprazole 20 MG capsule Commonly known as:  PRILOSEC   potassium chloride SA 20 MEQ tablet Commonly known as:  KLOR-CON M20   sitaGLIPtin 100 MG tablet Commonly known as:  JANUVIA   UNIFINE PENTIPS 32G X 4 MM Misc Generic drug:  Insulin Pen Needle     TAKE these medications   acetaminophen 325 MG tablet Commonly known as:  TYLENOL Take 2 tablets (650 mg total) by mouth every 6 (six) hours as needed for mild pain (or Fever >/= 101).   antiseptic oral rinse Liqd Apply 15 mLs topically as needed for dry mouth.   glycopyrrolate 1 MG tablet Commonly known as:  ROBINUL Take 1 tablet (1 mg total) by mouth every 4 (four) hours as needed (excessive secretions).   haloperidol 2 MG/ML solution Commonly known as:  HALDOL Place 0.3 mLs (0.6 mg total) under the tongue every 4 (four) hours  as needed for agitation (or delirium).   morphine CONCENTRATE 10 MG/0.5ML Soln concentrated solution Place 0.25 mLs (5 mg total) under the tongue every 2 (two) hours as needed for moderate pain (or dyspnea).   polyvinyl alcohol 1.4 % ophthalmic solution Commonly known as:  LIQUIFILM TEARS Place 1 drop into both eyes 4 (four) times daily as needed for dry eyes.   RESOURCE THICKENUP CLEAR Powd Take 1 Container by mouth as needed (Nectar Thick).   torsemide 20 MG tablet Commonly known as:  DEMADEX Take 2 Tablets (40 mg) In the AM And and Take 1 Tablet ( 20 mg) In the Evening. What changed:    how much to take  how to take this  when to take this  additional instructions     ASK your doctor about these medications   bisacodyl 10 MG suppository Commonly known as:  DULCOLAX Place 1 suppository (10 mg total) rectally once for 1 dose. Ask  about: Should I take this medication?       Allergies  Allergen Reactions  . Codeine Palpitations  . Decongestant [Pseudoephedrine Hcl Er] Palpitations   Consultations:  PCCM Transfer  Cardiology  Palliative Care Medicine  SLP  Procedures/Studies: Ct Head Wo Contrast  Result Date: 06/07/2017 CLINICAL DATA:  Altered level of consciousness. History of stroke, hypertension, diabetes, atrial fibrillation. EXAM: CT HEAD WITHOUT CONTRAST TECHNIQUE: Contiguous axial images were obtained from the base of the skull through the vertex without intravenous contrast. COMPARISON:  03/03/2017 FINDINGS: Brain: Chronic appearing bilateral subdural fluid collections without change since previous study. Mild cerebral atrophy. No ventricular dilatation. Patchy low-attenuation changes in the deep white matter consistent with small vessel ischemia. No mass-effect or midline shift. No abnormal extra-axial fluid collections. Gray-white matter junctions are distinct. Basal cisterns are not effaced. No acute intracranial hemorrhage. Extra-axial calcification along the left posterior parietal dural surface measuring 9 mm in diameter, likely representing a calcified meningioma. No change since prior study. Vascular: Internal carotid artery vascular calcifications are present. Skull: The calvarium appears intact. Sinuses/Orbits: Paranasal sinuses and mastoid air cells are not opacified. Other: None. IMPRESSION: No acute intracranial abnormalities. Chronic bilateral subdural hygromas without change since prior study. Left posterior parietal calcified meningioma also without change. Chronic atrophy and small vessel ischemic changes. Electronically Signed   By: Lucienne Capers M.D.   On: 06/07/2017 01:34   Dg Chest Port 1 View  Result Date: 06/12/2017 CLINICAL DATA:  Shortness of Breath EXAM: PORTABLE CHEST 1 VIEW COMPARISON:  06/11/2017 FINDINGS: Cardiac shadow remains enlarged. Defibrillator is again seen and stable.  Aortic calcifications are again noted. Stable bibasilar changes are noted. No new focal abnormality is seen. IMPRESSION: No significant interval change from the previous day. Electronically Signed   By: Inez Catalina M.D.   On: 06/12/2017 07:50   Dg Chest Port 1 View  Result Date: 06/11/2017 CLINICAL DATA:  77 year old female with acute respiratory failure and hypoxia EXAM: PORTABLE CHEST 1 VIEW COMPARISON:  Prior chest x-ray 06/10/2017 FINDINGS: Stable position of left subclavian approach biventricular cardiac rhythm maintenance device. Leads project over the right atrium, right ventricle and overlying the left ventricle. Marked of large of the cardiopericardial silhouette appears stable. Slightly improved aeration with decreasing interstitial edema. Persistent bibasilar opacities likely reflecting a combination of pleural fluid with atelectasis. No pneumothorax. No acute osseous abnormality. IMPRESSION: 1. Improving aeration likely secondary to decreasing interstitial edema. 2. Stable marked cardiomegaly. 3. Similar bibasilar airspace opacities likely reflecting a combination of pleural fluid with atelectasis  and/or infiltrate. Electronically Signed   By: Jacqulynn Cadet M.D.   On: 06/11/2017 10:05   Dg Chest Port 1 View  Result Date: 06/10/2017 CLINICAL DATA:  Acute respiratory failure with hypoxia. EXAM: PORTABLE CHEST 1 VIEW COMPARISON:  06/09/2017. FINDINGS: ET tube has been removed. Central venous catheter has been removed. Orogastric tube has been removed.Marked enlargement of cardiac silhouette. Unchanged AICD device. Worsening BILATERAL pulmonary opacities consistent with pulmonary edema. Increasing BILATERAL effusions. No pneumothorax. IMPRESSION: Worsening aeration. Related factors could be increasing pulmonary edema and/or low lung volumes from extubation. Electronically Signed   By: Staci Righter M.D.   On: 06/10/2017 08:46   Dg Chest Port 1 View  Result Date: 06/09/2017 CLINICAL DATA:   Respiratory failure. EXAM: PORTABLE CHEST 1 VIEW COMPARISON:  Radiograph June 08, 2017. FINDINGS: Stable cardiomegaly. Atherosclerosis of thoracic aorta is noted. Endotracheal and nasogastric tubes are unremarkable. Right internal jugular catheter is unchanged. Left-sided pacemaker is unchanged. No pneumothorax is noted. Stable bibasilar atelectasis is noted with associated pleural effusions. Bony thorax is unremarkable. IMPRESSION: Stable support apparatus. Stable bibasilar opacities as described above. Electronically Signed   By: Marijo Conception, M.D.   On: 06/09/2017 08:15   Dg Chest Port 1 View  Result Date: 06/08/2017 CLINICAL DATA:  Intubation.  Respiratory failure . EXAM: PORTABLE CHEST 1 VIEW COMPARISON:  06/06/2017. FINDINGS: Endotracheal tube, NG tube, right IJ line stable position. AICD with lead tips in stable position. Cardiomegaly with normal pulmonary vascularity. Interim clearing of right upper lung infiltrate/edema. Persistent bibasilar atelectasis. No prominent pleural effusion. No pneumothorax . IMPRESSION: 1. Lines and tubes stable position. AICD in stable position. Stable cardiomegaly. No pulmonary venous congestion. 2. Interim clearing of right upper lobe infiltrate/edema. Low lung volumes with bibasilar atelectasis. Electronically Signed   By: Marcello Moores  Register   On: 06/08/2017 07:27   Dg Chest Portable 1 View  Result Date: 06/06/2017 CLINICAL DATA:  Central line placement EXAM: PORTABLE CHEST 1 VIEW COMPARISON:  June 06, 2017 7:49 p.m. FINDINGS: Right jugular central venous line is identified with distal tip in the superior vena cava. There is no pneumothorax. Nasogastric tube is identified with distal tip in the stomach. Endotracheal tube is identified with distal tip 2 cm from carina, retraction by 3 cm is recommended. There is pulmonary edema. Minimal bilateral pleural effusions are noted. Consolidation of the right upper and mid lung and left lung base are noted. There is no  pneumothorax. The heart size is enlarged. Cardiac pacemaker is unchanged. IMPRESSION: Right jugular central venous line with distal tip in the superior vena cava. There is no pneumothorax. Endotracheal tube is identified distal tip 2 cm from carina, retraction by 3 cm is recommended. Electronically Signed   By: Abelardo Diesel M.D.   On: 06/06/2017 20:37   Dg Chest Portable 1 View  Result Date: 06/06/2017 CLINICAL DATA:  Encounter for OG tube placement EXAM: PORTABLE CHEST 1 VIEW COMPARISON:  06/06/2017 FINDINGS: There is a left chest wall ICD with leads in the right atrial appendage, coronary sinus and right ventricle. ET tube tip is above the carina. There is a enteric tube with side port below the GE junction. Cardiac enlargement and aortic atherosclerosis noted. Bilateral pleural effusions noted left greater than right. Pulmonary edema pattern is similar to previous exam. The right upper lobe airspace opacity is also unchanged. IMPRESSION: 1. ETT tip is just above the carina. The side port for the enteric tube is below the GE junction. 2. Cardiac enlargement and Aortic  Atherosclerosis (ICD10-I70.0). 3. Congestive heart failure. 4. No change in aeration to the right upper lobe compared with previous exam Electronically Signed   By: Kerby Moors M.D.   On: 06/06/2017 20:12   Dg Chest Portable 1 View  Result Date: 06/06/2017 CLINICAL DATA:  Chest pain and respiratory failure. EXAM: PORTABLE CHEST 1 VIEW COMPARISON:  03/03/2017 FINDINGS: Pacer/AICD device. Other leads and wires project over the chest. Midline trachea. Cardiomegaly accentuated by AP portable technique. Left pleural space not well evaluated. No right-sided pleural effusion or pneumothorax. Right greater than left interstitial and airspace disease. IMPRESSION: Bilateral interstitial and airspace disease, at least partially felt to represent pulmonary edema. Infection, especially within the right upper lobe, cannot be excluded. Suboptimal  evaluation of the inferior left hemithorax secondary to overlying pacer battery. Electronically Signed   By: Abigail Miyamoto M.D.   On: 06/06/2017 19:46   ECHOCARDIOGRAM ------------------------------------------------------------------- Study Conclusions  - Left ventricle: The cavity size was severely dilated. There was moderate concentric hypertrophy. Systolic function was normal. The estimated ejection fraction was in the range of 10% to 15%. Diffuse hypokinesis. Features are consistent with a pseudonormal left ventricular filling pattern, with concomitant abnormal relaxation and increased filling pressure (grade 2 diastolic dysfunction). Doppler parameters are consistent with elevated ventricular end-diastolic filling pressure. - Aortic valve: There was trivial regurgitation. - Mitral valve: Calcified annulus. Mildly thickened leaflets . There was moderate regurgitation. Valve area by pressure half-time: 2.5 cm^2. - Left atrium: The atrium was severely dilated. - Right ventricle: The cavity size was mildly dilated. Wall thickness was normal. Systolic function was moderately reduced. - Tricuspid valve: There was mild regurgitation. - Pulmonary arteries: Systolic pressure was mildly to moderately increased. PA peak pressure: 40 mm Hg (S). - Inferior vena cava: The vessel was normal in size. The respirophasic diameter changes were in the normal range (>= 50%), consistent with normal central venous pressure. - Pericardium, extracardiac: There was moderate to large pericardial effusion. Features were not consistent with tamponade physiology. There was a left pleural effusion.  Impressions:  - When compared to the prior study from 09/22/2016 LVEF remains severely dilated with severe systolic dysfunction. RVEF is moderately decreased. Pericardial effusion is larger, moderate to severe with maximum diameter 1.9 cm around the inferolateral wall  with no signs of tamponade. There is a large left pleural effusion.  Subjective: Seen and examined at beside and was slightly confused. Complained of mild abdominal discomfort. No CP and SOB ok. No other complaints and family elected to make patient comfort care and will D/C home with Hospice.   Discharge Exam: Vitals:   06/12/17 0531 06/13/17 0539  BP: 109/81 118/72  Pulse: 72 70  Resp:  16  Temp: 97.6 F (36.4 C) 97.9 F (36.6 C)  SpO2: 95% 95%   Vitals:   06/11/17 2100 06/12/17 0500 06/12/17 0531 06/13/17 0539  BP: 124/72  109/81 118/72  Pulse: 74  72 70  Resp: 16   16  Temp: 98.4 F (36.9 C)  97.6 F (36.4 C) 97.9 F (36.6 C)  TempSrc: Axillary  Axillary Axillary  SpO2: 94%  95% 95%  Weight: 49.1 kg (108 lb 3.9 oz) 49.1 kg (108 lb 3.9 oz)    Height:       General: Pt is awake, not in acute distress Cardiovascular: RRR, S1/S2 +, no rubs, no gallops Respiratory: Diminished bilaterally, no wheezing, no rhonchi Abdominal: Soft, Mildly tender, ND, bowel sounds + Extremities: Mild edema, no cyanosis  The results of significant  diagnostics from this hospitalization (including imaging, microbiology, ancillary and laboratory) are listed below for reference.    Microbiology: Recent Results (from the past 240 hour(s))  Urine culture     Status: None   Collection Time: 06/06/17  8:20 PM  Result Value Ref Range Status   Specimen Description URINE, CATHETERIZED  Final   Special Requests NONE  Final   Culture NO GROWTH  Final   Report Status 06/08/2017 FINAL  Final  Blood Culture (routine x 2)     Status: None   Collection Time: 06/06/17  8:50 PM  Result Value Ref Range Status   Specimen Description BLOOD RIGHT HAND  Final   Special Requests IN PEDIATRIC BOTTLE Blood Culture adequate volume  Final   Culture NO GROWTH 5 DAYS  Final   Report Status 06/11/2017 FINAL  Final  Blood Culture (routine x 2)     Status: None   Collection Time: 06/06/17  8:53 PM  Result Value  Ref Range Status   Specimen Description BLOOD RIGHT CENTRAL LINE  Final   Special Requests   Final    BOTTLES DRAWN AEROBIC AND ANAEROBIC Blood Culture adequate volume   Culture NO GROWTH 5 DAYS  Final   Report Status 06/11/2017 FINAL  Final  MRSA PCR Screening     Status: None   Collection Time: 06/06/17 11:37 PM  Result Value Ref Range Status   MRSA by PCR NEGATIVE NEGATIVE Final    Comment:        The GeneXpert MRSA Assay (FDA approved for NASAL specimens only), is one component of a comprehensive MRSA colonization surveillance program. It is not intended to diagnose MRSA infection nor to guide or monitor treatment for MRSA infections.     Labs: BNP (last 3 results) Recent Labs    03/24/17 1449 06/06/17 1928 06/11/17 0435  BNP 1,699.0* 2,063.5* >7,353.2*   Basic Metabolic Panel: Recent Labs  Lab 06/07/17 0350 06/07/17 1712 06/08/17 9924 06/08/17 1759 06/09/17 0400 06/10/17 0252 06/11/17 0435 06/12/17 0843  NA 138  --  136  --  140 142 142 144  K 3.7  --  3.3*  --  3.5 4.4 4.4 4.0  CL 107  --  105  --  109 109 109 110  CO2 23  --  26  --  27 25 23 24   GLUCOSE 83  --  232*  --  119* 98 106* 135*  BUN 16  --  19  --  18 21* 32* 41*  CREATININE 0.72  --  0.68  --  0.45 0.50 0.70 0.69  CALCIUM 9.0  --  7.8*  --  8.1* 9.0 8.8* 8.7*  MG 2.0 1.9 1.8 2.0  --   --   --  2.3  PHOS 3.4 3.5 3.5 2.1*  --   --   --  3.1   Liver Function Tests: Recent Labs  Lab 06/06/17 2045 06/12/17 0843  AST 300* 180*  ALT 256* 466*  ALKPHOS 77 52  BILITOT 1.7* 1.7*  PROT 6.4* 4.8*  ALBUMIN 3.9 2.8*   No results for input(s): LIPASE, AMYLASE in the last 168 hours. No results for input(s): AMMONIA in the last 168 hours. CBC: Recent Labs  Lab 06/08/17 0337 06/09/17 0400 06/10/17 0252 06/11/17 0435 06/12/17 0843  WBC 5.9 5.5 6.9 7.6 6.3  NEUTROABS  --   --   --   --  4.6  HGB 11.2* 10.5* 11.7* 13.2 12.8  HCT 35.2* 33.6* 36.6 41.3  38.9  MCV 98.6 100.3* 101.4* 103.8*  102.1*  PLT 111* 104* 109* 97* 92*   Cardiac Enzymes: No results for input(s): CKTOTAL, CKMB, CKMBINDEX, TROPONINI in the last 168 hours. BNP: Invalid input(s): POCBNP CBG: Recent Labs  Lab 06/11/17 1553 06/11/17 2008 06/11/17 2353 06/12/17 0355 06/12/17 0820  GLUCAP 146* 166* 233* 144* 140*   D-Dimer No results for input(s): DDIMER in the last 72 hours. Hgb A1c No results for input(s): HGBA1C in the last 72 hours. Lipid Profile No results for input(s): CHOL, HDL, LDLCALC, TRIG, CHOLHDL, LDLDIRECT in the last 72 hours. Thyroid function studies No results for input(s): TSH, T4TOTAL, T3FREE, THYROIDAB in the last 72 hours.  Invalid input(s): FREET3 Anemia work up No results for input(s): VITAMINB12, FOLATE, FERRITIN, TIBC, IRON, RETICCTPCT in the last 72 hours. Urinalysis    Component Value Date/Time   COLORURINE AMBER (A) 06/06/2017 2030   APPEARANCEUR CLEAR 06/06/2017 2030   [place] 1.021 06/06/2017 2030   [place] 6.0 06/06/2017 2030   GLUCOSEU NEGATIVE 06/06/2017 2030   [place] NEGATIVE 06/06/2017 2030   [place] NEGATIVE 06/06/2017 2030   KETONESUR 5 (A) 06/06/2017 2030   PROTEINUR 100 (A) 06/06/2017 2030   NITRITE NEGATIVE 06/06/2017 2030   LEUKOCYTESUR NEGATIVE 06/06/2017 2030   Sepsis Labs Invalid input(s): PROCALCITONIN,  WBC,  LACTICIDVEN Microbiology Recent Results (from the past 240 hour(s))  Urine culture     Status: None   Collection Time: 06/06/17  8:20 PM  Result Value Ref Range Status   Specimen Description URINE, CATHETERIZED  Final   Special Requests NONE  Final   Culture NO GROWTH  Final   Report Status 06/08/2017 FINAL  Final  Blood Culture (routine x 2)     Status: None   Collection Time: 06/06/17  8:50 PM  Result Value Ref Range Status   Specimen Description BLOOD RIGHT HAND  Final   Special Requests IN PEDIATRIC BOTTLE Blood Culture adequate volume  Final   Culture NO GROWTH 5 DAYS  Final   Report Status 06/11/2017 FINAL  Final   Blood Culture (routine x 2)     Status: None   Collection Time: 06/06/17  8:53 PM  Result Value Ref Range Status   Specimen Description BLOOD RIGHT CENTRAL LINE  Final   Special Requests   Final    BOTTLES DRAWN AEROBIC AND ANAEROBIC Blood Culture adequate volume   Culture NO GROWTH 5 DAYS  Final   Report Status 06/11/2017 FINAL  Final  MRSA PCR Screening     Status: None   Collection Time: 06/06/17 11:37 PM  Result Value Ref Range Status   MRSA by PCR NEGATIVE NEGATIVE Final    Comment:        The GeneXpert MRSA Assay (FDA approved for NASAL specimens only), is one component of a comprehensive MRSA colonization surveillance program. It is not intended to diagnose MRSA infection nor to guide or monitor treatment for MRSA infections.    Time coordinating discharge: 25 minutes  SIGNED:  Kerney Elbe, DO [facility] 06/13/2017, 1:18 PM Pager [phone]  If 7PM-7AM, please contact night-coverage [email or web address] Password [email or web address]

## 2017-06-12 NOTE — Progress Notes (Addendum)
Nutrition Brief Note  Chart reviewed.   1/4- extubated 1/5- s/p BSE, recommend NPO due to high aspiration risk 1/6- s/p repeat BSE, advanced to dysphagia 1 diet with nectar thick liquids (still with high aspiration risk)  Palliative care team had family meeting this AM; RD consult was d/c.  Pt now transitioning to comfort care; end of life protocol in place. Plan to discharge home with hospice services.  No further nutrition interventions warranted at this time.  Please re-consult as needed.   Natalia Wittmeyer A. Jimmye Norman, RD, LDN, CDE Pager: [phone] After hours Pager: [phone]

## 2017-06-13 DIAGNOSIS — R0602 Shortness of breath: Secondary | ICD-10-CM

## 2017-06-13 DIAGNOSIS — E875 Hyperkalemia: Secondary | ICD-10-CM

## 2017-06-13 NOTE — Progress Notes (Signed)
1640 Patient transferred home by PTAR.

## 2017-06-13 NOTE — Care Management Note (Addendum)
Case Management Note  Patient Details  Name: [name] MRN: [id number] Date of Birth: Aug 14, 1940  Subjective/Objective:                    Action/Plan:  DNR form signed, bedside nurse and family ready for patient to be discharged. DME in home including oxygen. Called [facility] , [facility] booked until after 1500. Bedside nurse, patient, family and Cassandra at [facility] all aware. Spoke to patient and family at bedside . All DME has been delivered to home, family ready for patient to be discharged.   Paged MD for signature on DNR form, will see if bedside nurse ready for PTAR to be called also.  Expected Discharge Date:  06/12/17               Expected Discharge Plan:  Home w Hospice Care  In-House Referral:     Discharge planning Services  CM Consult  Post Acute Care Choice:  Hospice Choice offered to:  Adult Children  DME Arranged:    DME Agency:     HH Arranged:    HH Agency:  [facility]  Status of Service:  In process, will continue to follow  If discussed at Long Length of Stay Meetings, dates discussed:    Additional Comments:  Marilu Favre, RN 06/13/2017, 10:39 AM

## 2017-06-19 ENCOUNTER — Other Ambulatory Visit: Payer: Self-pay

## 2017-06-19 NOTE — Patient Outreach (Signed)
 [place] [facility]) Care Management  06/19/2017  BRANDY ZUBA Oct 18, 1940 [id number]   Transition of care  Referral date:  Referral source: discharged from [facility] on 06/13/17 Insurance: [employer] Attempt #1  Telephone call to patient regarding transition of care follow up. HIPAA compliant voice message left with call back phone number.   PLAN: RNCM will attempt 2nd telephone call to patient within 3 business days.   Quinn Plowman RN,BSN,CCM [facility] Telephonic  [phone]

## 2017-06-20 ENCOUNTER — Other Ambulatory Visit: Payer: Self-pay

## 2017-06-20 NOTE — Patient Outreach (Signed)
 [place] [facility]) Care Management  06/20/2017  Donna [name] 1940-11-11 [id number]   Transition of care  Referral date:  Referral source: discharged from [facility] [facility] on 06/13/17 Insurance: [employer] Attempt #2  Telephone call to patient regarding transition of care follow up. HIPAA compliant voice message left with call back phone number.   PLAN: RNCM will attempt 3rd telephone call to patient within 3 business days.   Quinn Plowman RN,BSN,CCM [facility] Telephonic  [phone]

## 2017-06-21 ENCOUNTER — Other Ambulatory Visit: Payer: Self-pay

## 2017-06-21 NOTE — Patient Outreach (Signed)
 [place] [facility]) Care Management  06/21/2017  LAEL WETHERBEE [birth year] [id number]  Transition of care  Referral date:  Referral source: discharged from [place] [facility] on 06/13/17 Insurance: [employer] Attempt #3  Telephone call to patient / emergency contact [name]  regarding transition of care follow up. Unable to reach patient.  HIPAA compliant voice message left with call back phone number.   PLAN: RNCM will send patient outreach letter to attempt contact.   Quinn Plowman RN,BSN,CCM [facility] Telephonic  [phone]

## 2017-06-26 ENCOUNTER — Telehealth: Payer: Self-pay | Admitting: Cardiology

## 2017-06-26 ENCOUNTER — Encounter: Payer: Medicare HMO | Admitting: *Deleted

## 2017-06-26 NOTE — Telephone Encounter (Signed)
Donna [name] calling,  Patient passed away last 2022-09-17.

## 2017-06-26 NOTE — Telephone Encounter (Signed)
Noted in [place]. Email sent to HIM.

## 2017-06-26 NOTE — Telephone Encounter (Signed)
LMOVM reminding pt to send remote transmission.   

## 2017-06-27 ENCOUNTER — Other Ambulatory Visit: Payer: Self-pay

## 2017-06-27 NOTE — Patient Outreach (Signed)
[place] [facility]) Care Management  06/27/2017  Donna [name] Santa 1940-10-09 [id number]   Upon chart review.  Patient is deceased  PLAN:  RNCM will refer case to care management assistant to close. RNCM will send notification to patients listed primary MD of closure.   Quinn Plowman RN,BSN,CCM [facility] Telephonic  [phone]

## 2017-07-07 DEATH — deceased

## 2017-07-30 IMAGING — CT CT HEAD W/O CM
3 series · 15 of 47 positions shown, 18 images · non-contrast
Comparison: CT HEAD October 01, 2010

CLINICAL DATA: Confusion, speech difficulties for 2 weeks. No
trauma. History of hypertension, hyperlipidemia, stroke, AICD.

EXAM:
CT HEAD WITHOUT CONTRAST
TECHNIQUE: Contiguous axial images were obtained from the base of the skull
through the vertex without intravenous contrast.

[Series 2: head wo · axial · 0.40mm/px · z∈[+43,+168]mm · 9 of 30 slices shown, 12 images]
[im 3/30  brain]
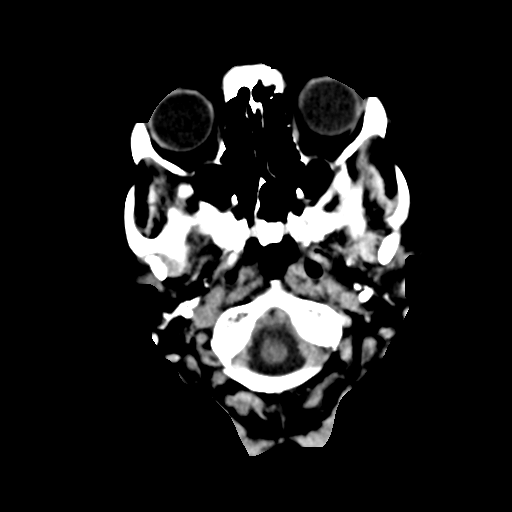
[im 3/30  bone]
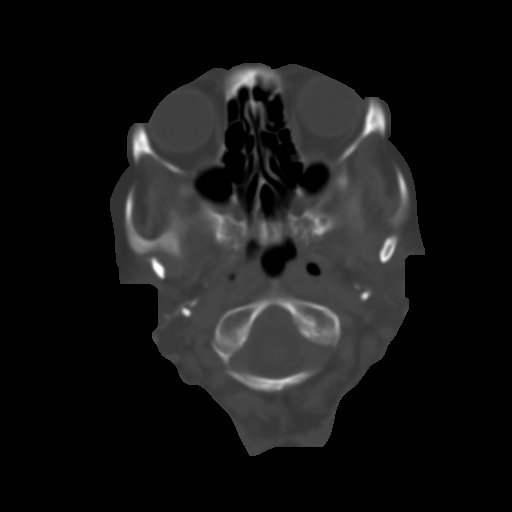
[im 6/30  brain]
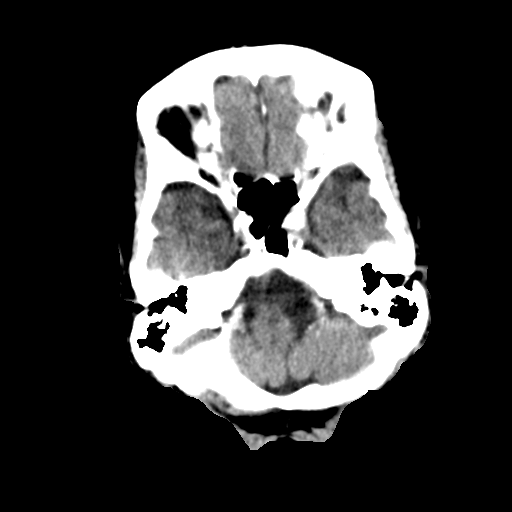
[im 9/30  brain]
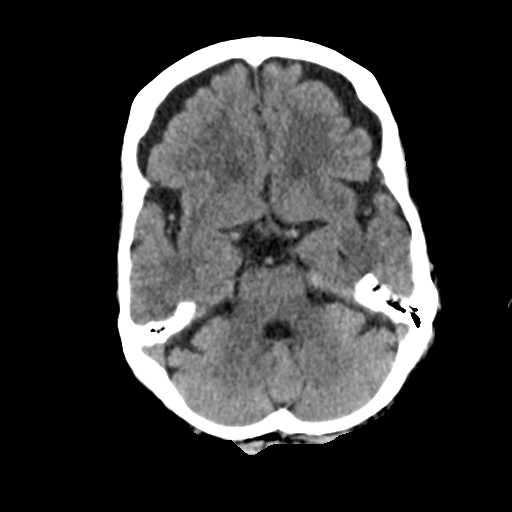
[im 12/30  brain]
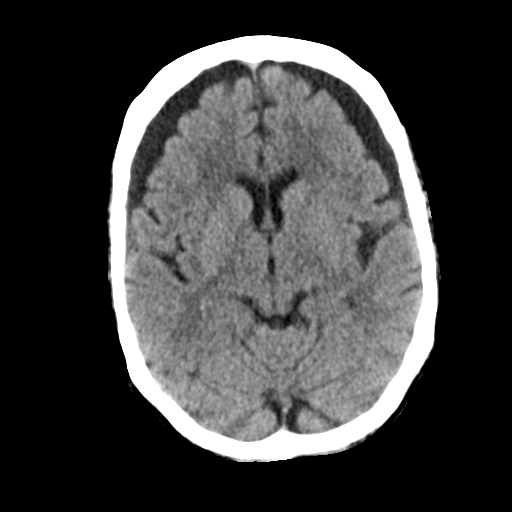
[im 16/30  brain]
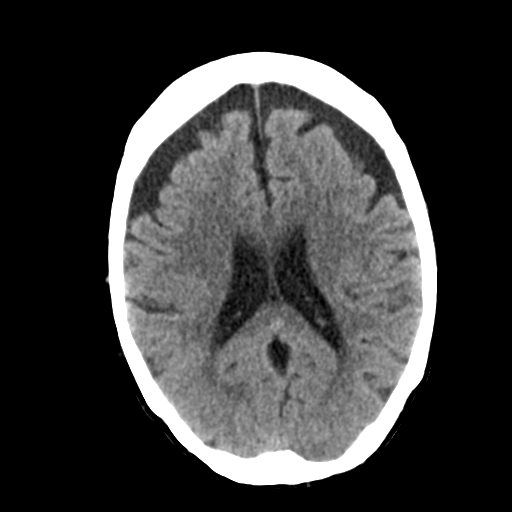
[im 16/30  bone]
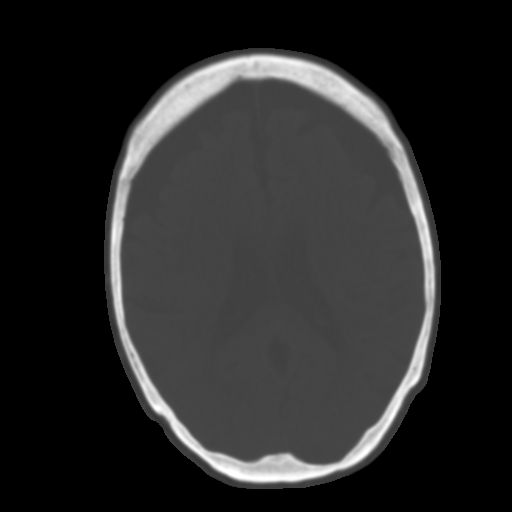
[im 19/30  brain]
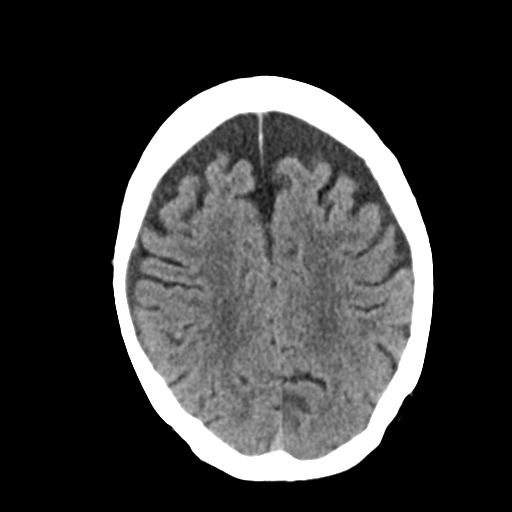
[im 22/30  brain]
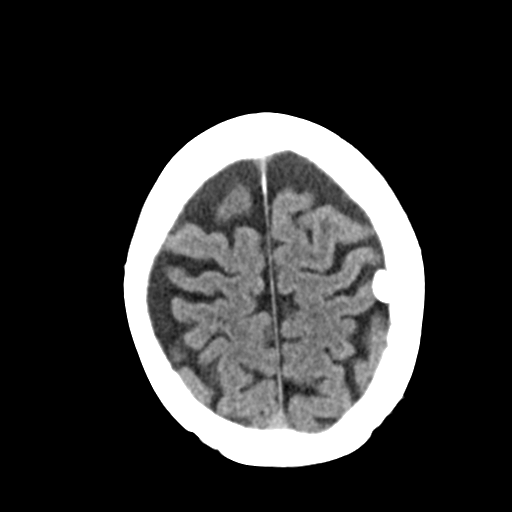
[im 25/30  brain]
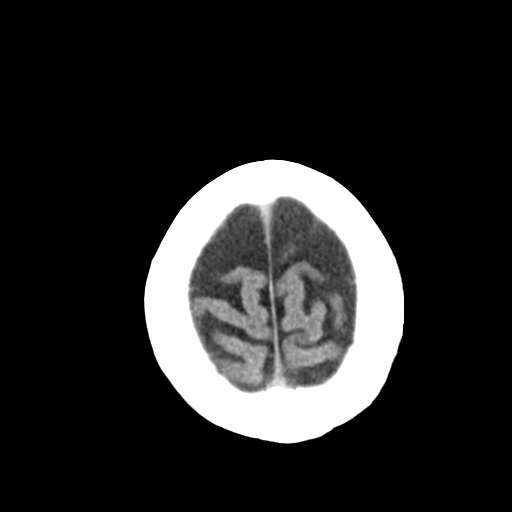
[im 28/30  brain]
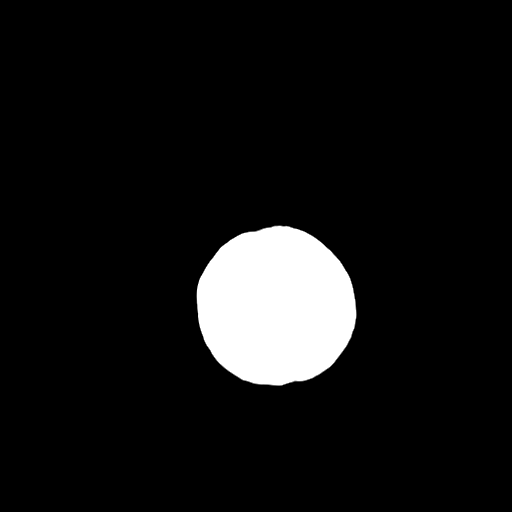
[im 28/30  bone]
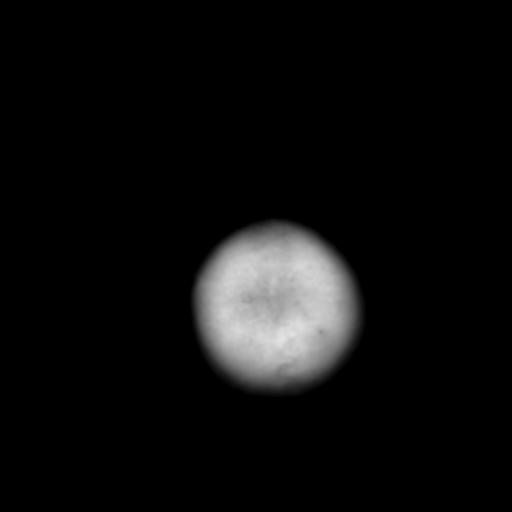

[Series 4: coronal soft tissue · coronal · 0.30mm/px · 3 of 68 slices shown]
[im 23/68  brain]
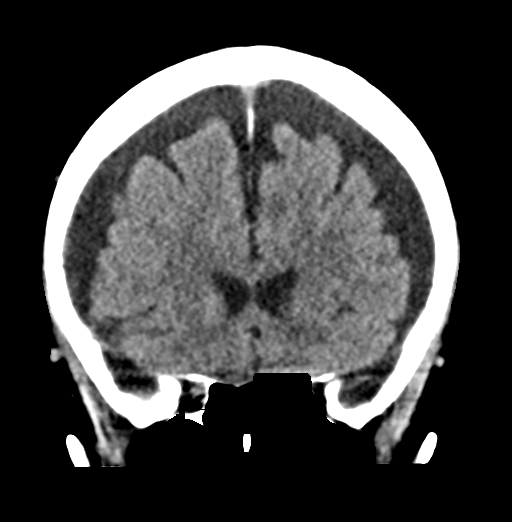
[im 30/68  brain]
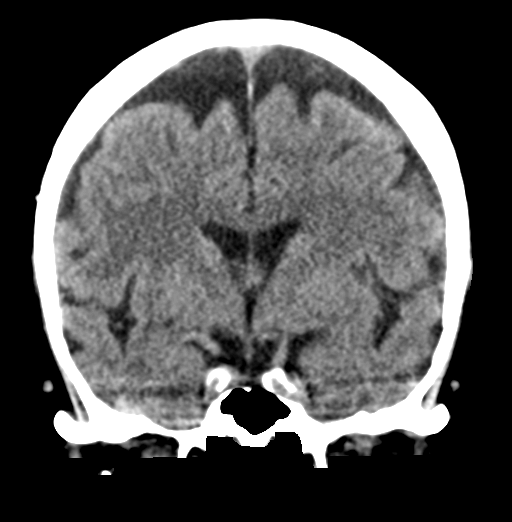
[im 38/68  brain]
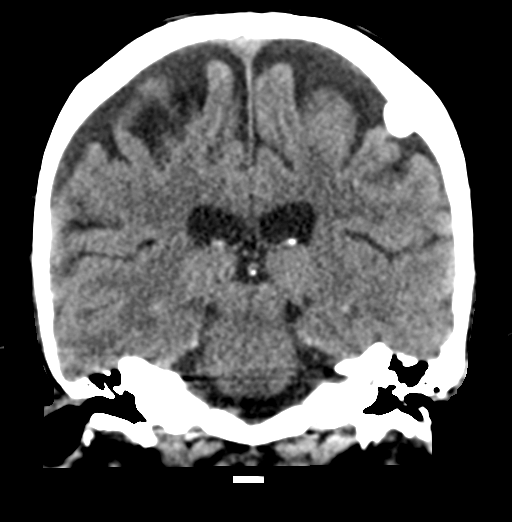

[Series 5: sagittal soft tissue · sagittal · 0.31mm/px · 3 of 51 slices shown]
[im 17/51  brain]
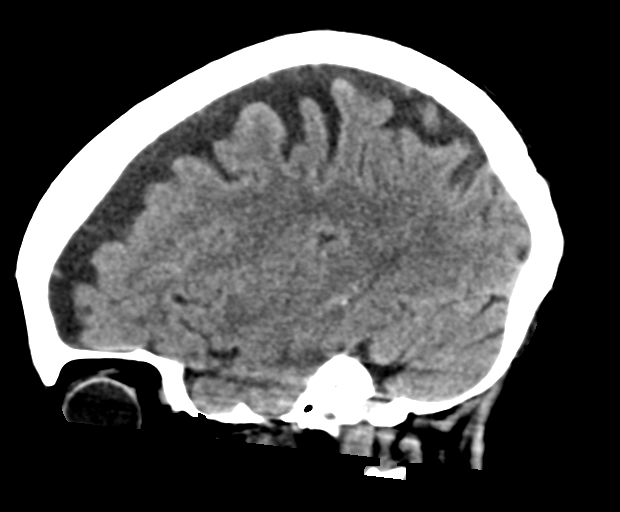
[im 26/51  brain]
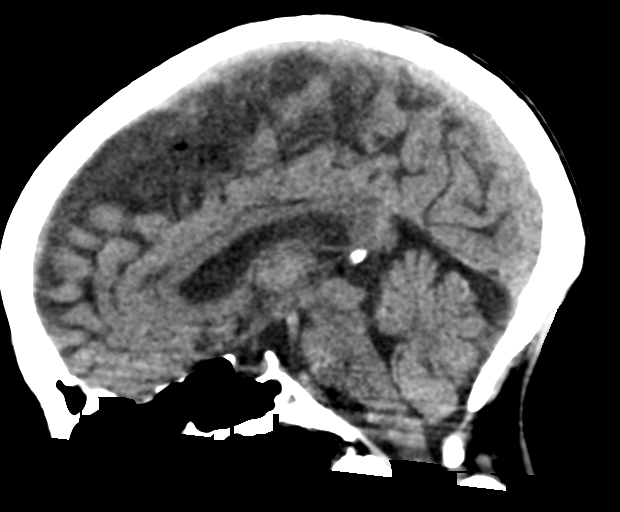
[im 34/51  brain]
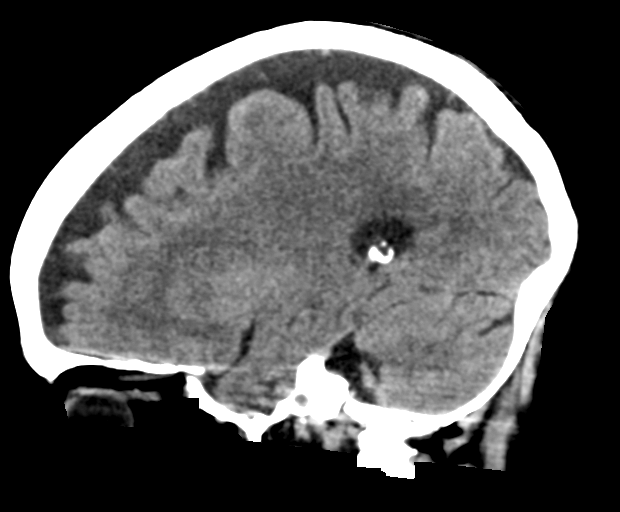

[15 of 47 positions shown; findings below may reference images not displayed]

FINDINGS: BRAIN: The ventricles are normal for age. No intraparenchymal
hemorrhage, mass effect nor midline shift. Patchy supratentorial
white matter hypodensities less than expected for patient's age,
though non-specific are most compatible with chronic small vessel
ischemic disease. No acute large vascular territory infarcts.
Slightly larger 10 mm bilateral frontal low-density extra-axial
fluid collections with mild mass effect on the subjacent sulci.
Basal cisterns are patent.

VASCULAR: Moderate calcific atherosclerosis of the carotid siphons.

SKULL: No skull fracture. Subcentimeter bony excrescence from inner
table LEFT frontal calvarium compatible with meningioma without mass
effect. No significant scalp soft tissue swelling.

SINUSES/ORBITS: The mastoid air-cells and included paranasal sinuses
are well-aerated.The included ocular globes and orbital contents are
non-suspicious.

OTHER: None.
IMPRESSION: Slightly larger 10 mm bilateral frontal presumed hygromas with mild
mass effect, no midline shift.

Subcentimeter LEFT frontal probable meningioma.

These results will be called to the ordering clinician or
representative by the Radiologist Assistant, and communication
documented in the [employer] Dashboard.

## 2017-08-17 ENCOUNTER — Ambulatory Visit: Payer: Medicare HMO | Admitting: Cardiology
